# Patient Record
Sex: Male | Born: 1960 | Race: White | Hispanic: No | Marital: Married | Smoking: Current every day smoker
Health system: Southern US, Community
[De-identification: ages and names within clinical notes are randomized; demographics above are authoritative.]

## PROBLEM LIST (undated history)

## (undated) DIAGNOSIS — R51 Headache: Secondary | ICD-10-CM

## (undated) DIAGNOSIS — F419 Anxiety disorder, unspecified: Secondary | ICD-10-CM

## (undated) DIAGNOSIS — I639 Cerebral infarction, unspecified: Secondary | ICD-10-CM

## (undated) DIAGNOSIS — R0600 Dyspnea, unspecified: Secondary | ICD-10-CM

## (undated) DIAGNOSIS — L0390 Cellulitis, unspecified: Secondary | ICD-10-CM

## (undated) DIAGNOSIS — R42 Dizziness and giddiness: Secondary | ICD-10-CM

## (undated) DIAGNOSIS — I10 Essential (primary) hypertension: Secondary | ICD-10-CM

## (undated) DIAGNOSIS — F32A Depression, unspecified: Secondary | ICD-10-CM

## (undated) DIAGNOSIS — R519 Headache, unspecified: Secondary | ICD-10-CM

## (undated) DIAGNOSIS — G8929 Other chronic pain: Secondary | ICD-10-CM

## (undated) DIAGNOSIS — J449 Chronic obstructive pulmonary disease, unspecified: Secondary | ICD-10-CM

## (undated) HISTORY — PX: TONSILLECTOMY: SUR1361

---

## 1998-12-22 DIAGNOSIS — I639 Cerebral infarction, unspecified: Secondary | ICD-10-CM

## 1998-12-22 HISTORY — DX: Cerebral infarction, unspecified: I63.9

## 2001-04-27 ENCOUNTER — Emergency Department (HOSPITAL_COMMUNITY): Admission: EM | Admit: 2001-04-27 | Discharge: 2001-04-27 | Payer: Self-pay | Admitting: *Deleted

## 2001-06-03 ENCOUNTER — Emergency Department (HOSPITAL_COMMUNITY): Admission: EM | Admit: 2001-06-03 | Discharge: 2001-06-03 | Payer: Self-pay | Admitting: Emergency Medicine

## 2001-08-13 ENCOUNTER — Emergency Department (HOSPITAL_COMMUNITY): Admission: EM | Admit: 2001-08-13 | Discharge: 2001-08-13 | Payer: Self-pay | Admitting: Emergency Medicine

## 2001-09-08 ENCOUNTER — Encounter: Payer: Self-pay | Admitting: *Deleted

## 2001-09-08 ENCOUNTER — Emergency Department (HOSPITAL_COMMUNITY): Admission: EM | Admit: 2001-09-08 | Discharge: 2001-09-08 | Payer: Self-pay | Admitting: *Deleted

## 2001-09-16 ENCOUNTER — Emergency Department (HOSPITAL_COMMUNITY): Admission: EM | Admit: 2001-09-16 | Discharge: 2001-09-16 | Payer: Self-pay | Admitting: *Deleted

## 2001-10-09 ENCOUNTER — Emergency Department (HOSPITAL_COMMUNITY): Admission: EM | Admit: 2001-10-09 | Discharge: 2001-10-09 | Payer: Self-pay | Admitting: Emergency Medicine

## 2001-10-24 ENCOUNTER — Encounter: Payer: Self-pay | Admitting: *Deleted

## 2001-10-24 ENCOUNTER — Emergency Department (HOSPITAL_COMMUNITY): Admission: EM | Admit: 2001-10-24 | Discharge: 2001-10-24 | Payer: Self-pay | Admitting: *Deleted

## 2002-02-19 ENCOUNTER — Emergency Department (HOSPITAL_COMMUNITY): Admission: EM | Admit: 2002-02-19 | Discharge: 2002-02-19 | Payer: Self-pay | Admitting: Internal Medicine

## 2002-02-19 ENCOUNTER — Emergency Department (HOSPITAL_COMMUNITY): Admission: EM | Admit: 2002-02-19 | Discharge: 2002-02-19 | Payer: Self-pay | Admitting: *Deleted

## 2002-06-24 ENCOUNTER — Emergency Department (HOSPITAL_COMMUNITY): Admission: EM | Admit: 2002-06-24 | Discharge: 2002-06-24 | Payer: Self-pay | Admitting: Internal Medicine

## 2003-06-02 ENCOUNTER — Emergency Department (HOSPITAL_COMMUNITY): Admission: EM | Admit: 2003-06-02 | Discharge: 2003-06-03 | Payer: Self-pay | Admitting: *Deleted

## 2003-06-04 ENCOUNTER — Encounter: Payer: Self-pay | Admitting: Internal Medicine

## 2003-06-04 ENCOUNTER — Emergency Department (HOSPITAL_COMMUNITY): Admission: EM | Admit: 2003-06-04 | Discharge: 2003-06-04 | Payer: Self-pay | Admitting: Internal Medicine

## 2003-07-18 ENCOUNTER — Emergency Department (HOSPITAL_COMMUNITY): Admission: EM | Admit: 2003-07-18 | Discharge: 2003-07-18 | Payer: Self-pay | Admitting: *Deleted

## 2003-07-20 ENCOUNTER — Encounter: Payer: Self-pay | Admitting: Family Medicine

## 2003-07-20 ENCOUNTER — Inpatient Hospital Stay (HOSPITAL_COMMUNITY): Admission: EM | Admit: 2003-07-20 | Discharge: 2003-07-24 | Payer: Self-pay | Admitting: Emergency Medicine

## 2003-07-26 ENCOUNTER — Encounter: Payer: Self-pay | Admitting: General Surgery

## 2003-07-26 ENCOUNTER — Ambulatory Visit (HOSPITAL_COMMUNITY): Admission: RE | Admit: 2003-07-26 | Discharge: 2003-07-26 | Payer: Self-pay | Admitting: General Surgery

## 2003-09-27 ENCOUNTER — Emergency Department (HOSPITAL_COMMUNITY): Admission: EM | Admit: 2003-09-27 | Discharge: 2003-09-27 | Payer: Self-pay | Admitting: Emergency Medicine

## 2003-12-15 ENCOUNTER — Emergency Department (HOSPITAL_COMMUNITY): Admission: EM | Admit: 2003-12-15 | Discharge: 2003-12-15 | Payer: Self-pay | Admitting: Emergency Medicine

## 2004-01-10 ENCOUNTER — Emergency Department (HOSPITAL_COMMUNITY): Admission: EM | Admit: 2004-01-10 | Discharge: 2004-01-10 | Payer: Self-pay | Admitting: Emergency Medicine

## 2004-05-01 ENCOUNTER — Emergency Department (HOSPITAL_COMMUNITY): Admission: EM | Admit: 2004-05-01 | Discharge: 2004-05-01 | Payer: Self-pay | Admitting: *Deleted

## 2005-02-07 ENCOUNTER — Emergency Department (HOSPITAL_COMMUNITY): Admission: EM | Admit: 2005-02-07 | Discharge: 2005-02-07 | Payer: Self-pay | Admitting: *Deleted

## 2005-02-24 ENCOUNTER — Emergency Department (HOSPITAL_COMMUNITY): Admission: EM | Admit: 2005-02-24 | Discharge: 2005-02-24 | Payer: Self-pay | Admitting: *Deleted

## 2005-03-30 ENCOUNTER — Observation Stay (HOSPITAL_COMMUNITY): Admission: EM | Admit: 2005-03-30 | Discharge: 2005-03-31 | Payer: Self-pay | Admitting: *Deleted

## 2005-06-07 ENCOUNTER — Emergency Department (HOSPITAL_COMMUNITY): Admission: EM | Admit: 2005-06-07 | Discharge: 2005-06-07 | Payer: Self-pay | Admitting: Emergency Medicine

## 2005-06-14 ENCOUNTER — Emergency Department (HOSPITAL_COMMUNITY): Admission: EM | Admit: 2005-06-14 | Discharge: 2005-06-14 | Payer: Self-pay | Admitting: Emergency Medicine

## 2005-09-09 ENCOUNTER — Emergency Department (HOSPITAL_COMMUNITY): Admission: EM | Admit: 2005-09-09 | Discharge: 2005-09-09 | Payer: Self-pay | Admitting: Emergency Medicine

## 2005-09-20 ENCOUNTER — Emergency Department (HOSPITAL_COMMUNITY): Admission: EM | Admit: 2005-09-20 | Discharge: 2005-09-20 | Payer: Self-pay | Admitting: Emergency Medicine

## 2006-04-29 ENCOUNTER — Emergency Department (HOSPITAL_COMMUNITY): Admission: EM | Admit: 2006-04-29 | Discharge: 2006-04-29 | Payer: Self-pay | Admitting: Emergency Medicine

## 2006-04-30 ENCOUNTER — Emergency Department (HOSPITAL_COMMUNITY): Admission: EM | Admit: 2006-04-30 | Discharge: 2006-04-30 | Payer: Self-pay | Admitting: Emergency Medicine

## 2006-06-26 ENCOUNTER — Emergency Department (HOSPITAL_COMMUNITY): Admission: EM | Admit: 2006-06-26 | Discharge: 2006-06-26 | Payer: Self-pay | Admitting: Emergency Medicine

## 2006-07-13 ENCOUNTER — Emergency Department (HOSPITAL_COMMUNITY): Admission: EM | Admit: 2006-07-13 | Discharge: 2006-07-13 | Payer: Self-pay | Admitting: Emergency Medicine

## 2006-10-26 ENCOUNTER — Emergency Department (HOSPITAL_COMMUNITY): Admission: EM | Admit: 2006-10-26 | Discharge: 2006-10-26 | Payer: Self-pay | Admitting: Emergency Medicine

## 2006-12-14 ENCOUNTER — Emergency Department (HOSPITAL_COMMUNITY): Admission: EM | Admit: 2006-12-14 | Discharge: 2006-12-14 | Payer: Self-pay | Admitting: Emergency Medicine

## 2007-03-15 ENCOUNTER — Emergency Department (HOSPITAL_COMMUNITY): Admission: EM | Admit: 2007-03-15 | Discharge: 2007-03-16 | Payer: Self-pay | Admitting: Emergency Medicine

## 2007-07-06 ENCOUNTER — Emergency Department (HOSPITAL_COMMUNITY): Admission: EM | Admit: 2007-07-06 | Discharge: 2007-07-06 | Payer: Self-pay | Admitting: Emergency Medicine

## 2007-09-16 ENCOUNTER — Ambulatory Visit (HOSPITAL_COMMUNITY): Admission: RE | Admit: 2007-09-16 | Discharge: 2007-09-16 | Payer: Self-pay | Admitting: Family Medicine

## 2007-12-20 ENCOUNTER — Emergency Department (HOSPITAL_COMMUNITY): Admission: EM | Admit: 2007-12-20 | Discharge: 2007-12-20 | Payer: Self-pay | Admitting: Emergency Medicine

## 2008-04-01 ENCOUNTER — Emergency Department (HOSPITAL_COMMUNITY): Admission: EM | Admit: 2008-04-01 | Discharge: 2008-04-01 | Payer: Self-pay | Admitting: Emergency Medicine

## 2008-04-16 ENCOUNTER — Emergency Department (HOSPITAL_COMMUNITY): Admission: EM | Admit: 2008-04-16 | Discharge: 2008-04-16 | Payer: Self-pay | Admitting: Emergency Medicine

## 2008-04-19 ENCOUNTER — Emergency Department (HOSPITAL_COMMUNITY): Admission: EM | Admit: 2008-04-19 | Discharge: 2008-04-19 | Payer: Self-pay | Admitting: Emergency Medicine

## 2008-05-06 ENCOUNTER — Emergency Department (HOSPITAL_COMMUNITY): Admission: EM | Admit: 2008-05-06 | Discharge: 2008-05-06 | Payer: Self-pay | Admitting: Emergency Medicine

## 2008-10-18 ENCOUNTER — Emergency Department (HOSPITAL_COMMUNITY): Admission: EM | Admit: 2008-10-18 | Discharge: 2008-10-18 | Payer: Self-pay | Admitting: Emergency Medicine

## 2009-06-02 ENCOUNTER — Emergency Department (HOSPITAL_COMMUNITY): Admission: EM | Admit: 2009-06-02 | Discharge: 2009-06-02 | Payer: Self-pay | Admitting: Emergency Medicine

## 2009-06-14 ENCOUNTER — Emergency Department (HOSPITAL_COMMUNITY): Admission: EM | Admit: 2009-06-14 | Discharge: 2009-06-14 | Payer: Self-pay | Admitting: Emergency Medicine

## 2009-06-16 ENCOUNTER — Emergency Department (HOSPITAL_COMMUNITY): Admission: EM | Admit: 2009-06-16 | Discharge: 2009-06-16 | Payer: Self-pay | Admitting: Emergency Medicine

## 2009-07-01 ENCOUNTER — Emergency Department (HOSPITAL_COMMUNITY): Admission: EM | Admit: 2009-07-01 | Discharge: 2009-07-01 | Payer: Self-pay | Admitting: Emergency Medicine

## 2009-07-04 ENCOUNTER — Emergency Department (HOSPITAL_COMMUNITY): Admission: EM | Admit: 2009-07-04 | Discharge: 2009-07-04 | Payer: Self-pay | Admitting: Emergency Medicine

## 2009-07-13 ENCOUNTER — Emergency Department (HOSPITAL_COMMUNITY): Admission: EM | Admit: 2009-07-13 | Discharge: 2009-07-13 | Payer: Self-pay | Admitting: Emergency Medicine

## 2009-12-01 ENCOUNTER — Emergency Department (HOSPITAL_COMMUNITY): Admission: EM | Admit: 2009-12-01 | Discharge: 2009-12-01 | Payer: Self-pay | Admitting: Emergency Medicine

## 2010-07-02 ENCOUNTER — Emergency Department (HOSPITAL_COMMUNITY): Admission: EM | Admit: 2010-07-02 | Discharge: 2010-07-02 | Payer: Self-pay | Admitting: Emergency Medicine

## 2010-07-16 ENCOUNTER — Emergency Department (HOSPITAL_COMMUNITY): Admission: EM | Admit: 2010-07-16 | Discharge: 2010-07-16 | Payer: Self-pay | Admitting: Emergency Medicine

## 2010-08-15 ENCOUNTER — Emergency Department (HOSPITAL_COMMUNITY): Admission: EM | Admit: 2010-08-15 | Discharge: 2010-08-15 | Payer: Self-pay | Admitting: Emergency Medicine

## 2010-12-07 ENCOUNTER — Emergency Department (HOSPITAL_COMMUNITY)
Admission: EM | Admit: 2010-12-07 | Discharge: 2010-12-07 | Payer: Self-pay | Source: Home / Self Care | Admitting: Emergency Medicine

## 2010-12-21 ENCOUNTER — Emergency Department (HOSPITAL_COMMUNITY)
Admission: EM | Admit: 2010-12-21 | Discharge: 2010-12-21 | Payer: Self-pay | Source: Home / Self Care | Admitting: Emergency Medicine

## 2010-12-31 ENCOUNTER — Emergency Department (HOSPITAL_COMMUNITY)
Admission: EM | Admit: 2010-12-31 | Discharge: 2010-12-31 | Payer: Self-pay | Source: Home / Self Care | Admitting: Emergency Medicine

## 2011-01-12 ENCOUNTER — Encounter: Payer: Self-pay | Admitting: Occupational Therapy

## 2011-03-03 ENCOUNTER — Emergency Department (HOSPITAL_COMMUNITY)
Admission: EM | Admit: 2011-03-03 | Discharge: 2011-03-03 | Disposition: A | Discharge status: Home / Self Care | Payer: Self-pay | Attending: Emergency Medicine | Admitting: Emergency Medicine

## 2011-03-09 LAB — DIFFERENTIAL
Basophils Relative: 1 % (ref 0–1)
Eosinophils Absolute: 0.4 10*3/uL (ref 0.0–0.7)
Eosinophils Relative: 5 % (ref 0–5)
Lymphocytes Relative: 29 % (ref 12–46)
Lymphs Abs: 2.8 10*3/uL (ref 0.7–4.0)
Monocytes Absolute: 0.7 10*3/uL (ref 0.1–1.0)
Monocytes Relative: 7 % (ref 3–12)
Neutro Abs: 5.8 10*3/uL (ref 1.7–7.7)

## 2011-03-09 LAB — CBC
RBC: 5.8 MIL/uL (ref 4.22–5.81)
WBC: 9.7 10*3/uL (ref 4.0–10.5)

## 2011-03-09 LAB — BASIC METABOLIC PANEL
CO2: 27 mEq/L (ref 19–32)
Calcium: 9.2 mg/dL (ref 8.4–10.5)
Creatinine, Ser: 0.91 mg/dL (ref 0.4–1.5)
GFR calc Af Amer: >60 mL/min (ref >60)
GFR calc non Af Amer: >60 mL/min (ref >60)

## 2011-03-31 LAB — URINALYSIS, ROUTINE W REFLEX MICROSCOPIC
Nitrite: NEGATIVE
Specific Gravity, Urine: >1.03 — ABNORMAL HIGH (ref 1.005–1.030)
Urobilinogen, UA: 0.2 mg/dL (ref 0.0–1.0)

## 2011-04-26 ENCOUNTER — Emergency Department (HOSPITAL_COMMUNITY)
Admission: EM | Admit: 2011-04-26 | Discharge: 2011-04-26 | Disposition: A | Discharge status: Home / Self Care | Payer: Medicaid Other | Attending: Emergency Medicine | Admitting: Emergency Medicine

## 2011-04-26 DIAGNOSIS — Z79899 Other long term (current) drug therapy: Secondary | ICD-10-CM | POA: Insufficient documentation

## 2011-04-26 DIAGNOSIS — I10 Essential (primary) hypertension: Secondary | ICD-10-CM | POA: Insufficient documentation

## 2011-04-26 DIAGNOSIS — K089 Disorder of teeth and supporting structures, unspecified: Secondary | ICD-10-CM | POA: Insufficient documentation

## 2011-04-26 DIAGNOSIS — F411 Generalized anxiety disorder: Secondary | ICD-10-CM | POA: Insufficient documentation

## 2011-04-26 DIAGNOSIS — E7800 Pure hypercholesterolemia, unspecified: Secondary | ICD-10-CM | POA: Insufficient documentation

## 2011-05-09 NOTE — H&P (Signed)
NAME:  AMOS, MICHEALS                        ACCOUNT NO.:  [id number]   MEDICAL RECORD NO.:  [id number]                   PATIENT TYPE:  INP   LOCATION:  A328                                 FACILITY:  [facility]   PHYSICIAN:  Annia Friendly. Loleta Chance, M.D.                DATE OF BIRTH:  09-04-1961   DATE OF ADMISSION:  07/19/2003  DATE OF DISCHARGE:                                HISTORY & PHYSICAL   HISTORY:  The patient is a 50 year old married, disabled white male from  [place], [place].  Chief complaint is painful red area on leg  lateral posterior thigh x3 days.  The patient was seen in the emergency room  at [facility] for swelling and redness of right posterior thigh on  March 18, 2003.  He was treated with analgesia.  History is positive for  fever (101 degrees Fahrenheit at home on the night of admission), general  malaise, and chills.  History is negative for known drainage from affected  area.  The patient indicated the area has gotten worse secondary to frequent  scratching after removing four ticks approximately four months ago by  spouse.  He denies headache, diplopia, joint pain, and rashes.  Medical  history is positive for stroke in 2001 with mild right hemiparesis (treated  at [facility] by Dr. Esmeralda Arthur), hypertension, and chronic anxiety.  Medical history is negative for diabetes, cancer, cystic fibrosis, asthma,  seizure disorder.   PRESCRIBED MEDICATIONS:  1. Norvasc 5 mg p.o. daily.  2. Xanax 1 mg p.o. three times daily.  3. Darvocet-N 100 1 tablet p.o. q.6h. p.r.n. for pain.  4. Plavix 75 mg p.o. daily.  5. The patient also is taking an antibiotic as prescribed by the emergency     room (name unknown).   ALLERGIES:  The patient is allergic to PENICILLIN (rash) and allergic to  CODEINE (increased heart rate).   HABITS:  Positive for cigarette smoking (one pack per day since age 27).  Habits are positive for former use of ethanol x 16 years  and former use of  street drugs.   PAST MEDICAL HISTORY:  1. Positive for hospitalization also for tonsillectomy at age 33 at [facility] in [place], [place].  2. Pneumonia and left rib fracture after experiencing being hit by a fallen     tree at [facility] [place].   FAMILY HISTORY:  Mother living, age 22, health unknown.  Father deceased in  his 71s secondary to heart attack.  One sister living, age 9, good health.  One brother living, age 14, with a history of heart disease.  Three sons  living, ages 88, 49, and 108, good health.   SEXUALLY-TRANSMITTED DISEASE:  Negative for gonorrhea, syphilis, herpes, and  HIV infection.   REVIEW OF SYSTEMS:  Positive for chronic right eye pain, being managed by  ophthalmologist in [place].  Episodic moods of melancholy, crying spells.  Episodic numbness and weakness of right hand.  Review of systems is negative  for epistaxis, dysphagia, hemoptysis, chest pain, hematemesis, dysuria,  gross hematuria, melena, constipation, diarrhea, edema of legs, weight loss,  night sweats, etc.   PHYSICAL EXAMINATION:  VITAL SIGNS:  Temperature 97, pulse 64, respirations  18, blood pressure 102/60.  GENERAL:  Middle-aged, medium-framed, medium-height white male in no  apparent respiratory distress.  HEENT:  Head:  Normocephalic.  Ears:  Normal auricles.  External canals  patent.  Tympanic membranes pearly gray.  Eyes:  Lids negative for ptosis.  Sclerae white.  Pupils round and equal and reactive to light.  Extraocular  movements intact.  Nose:  Negative for discharge.  Mouth:  Positive for  missing teeth.  Remainder of dentition poor with many cavities.  No bleeding  gums.  No oral lesions.  Posterior pharynx benign.  NECK:  Negative for lymphadenopathy or thyromegaly.  LUNGS:  Clear.  HEART:  Audible S1, S2.  Without murmur, rub, or gallop.  Regular rate and  rhythm.  ABDOMEN:  No distention.   Hyperactive bowel sounds.  Soft and nontender in  all four quadrants.  No palpable mass.  No organomegaly.  GENITOURINARY:  Normal male, uncircumcised.  No penile lesion or discharge.  Scrotum:  Palpable testicles without nodules or tenderness.  RECTAL:  No external lesions.  Digital exam:  Prostate mildly enlarged and  smooth.  No rectal vault masses.  Stool guaiac-negative.  EXTREMITIES:  Right lateral posterior thigh positive for tender, red area of  induration about the diameter of 3 cm x 3 cm, surrounded by confluent  redness.  No active drainage.  A few pustular lesions at the center.  Tender  on palpation.  Joints:  No redness, no swelling, no hotness.  NEUROLOGIC:  Alert and oriented to person, place, and time.  Cranial nerves  II-XII appeared intact.   LABORATORIES:  Urinalysis:  Specific gravity 1.010, pH 5, no glucose, no  bilirubin, no ketones, no blood.  Negative for protein, nitrate negative,  leukocyte esterase negative.  White count 14.6, hemoglobin 11.6, hematocrit  35.9, platelets 248,000.  Sodium 134, potassium 3.9, chloride 103, CO2 31,  glucose 103, BUN 10, creatinine 0.9, calcium 8.9.  [facility] spotted  fever:  IgM 0.5, reference range indicates negative, less than 0.9.   IMPRESSION:  Primary right lateral posterior thigh abscess with surrounding  cellulitis.   SECONDARY DIAGNOSES:  1. Hypertension.  2. Chronic anxiety.   PLAN:  X-ray of the right thigh.  Blood cultures.  IV.  Antibiotic using  doxycycline.  IV fluids.  Tylenol for temperature.  Darvocet-N 100 for pain.  Plavix 75 mg p.o. daily.  Norvasc 5 mg p.o. daily.  One aspirin 81 mg p.o.  daily.  Protonix 40 mg p.o. daily.  Xanax 1 mg p.o. three times daily.  Diet:  Four-gram sodium.  Surgical consult.                                               Annia Friendly. Loleta Chance, M.D.    Levonne Hubert  D:  07/20/2003  T:  07/20/2003  Job:  [id number]

## 2011-05-09 NOTE — Consult Note (Signed)
NAME:  Gary Harrington, Gary Harrington NO.:  [id number]   MEDICAL RECORD NO.:  [id number]          PATIENT TYPE:  EMS   LOCATION:  ED                            FACILITY:  [facility]   PHYSICIAN:  J. Darreld Mclean, M.D. DATE OF BIRTH:  07-19-1961   DATE OF CONSULTATION:  09/09/2005  DATE OF DISCHARGE:                                   CONSULTATION   REFERRING PHYSICIAN:  Rhae Lerner. Margretta Ditty, M.D.   CHIEF COMPLAINT:  I got cut with a piece of glass.   REASON FOR CONSULTATION:  Dr. Margretta Ditty asked that I see him.  The patient is  a 50 year old male who was working on a house.  He had a window that broke  and a piece of glass slipped and fell and cut him in his mid left forearm.  He lost sensation of the tips of the fingers in the median distribution and  he does not appear to have palmaris longus intact.  The flexor tendons to  each of the fingers work and to the thumb is working.  He is on Plavix and  he bled rather freely.  He is on Plavix because he had a stroke.  He is very  anxious.  No other apparent injuries.   It appears that he has cut the palmaris longus and probably the median  nerve, as best I can tell as he bleeding rather freely.  Hemostasis was  obtained and then the wound was sutured with staples.  Sterile dressing was  applied and bulky dressing applied.   IMPRESSION:  Laceration of palmaris longus tendon with probable laceration  of the median nerve.   RECOMMENDATIONS:  He needs to come off the Plavix.  I told him to stop that.  He will need surgical repair of these probably next week.  I will see him in  the office tomorrow.  A prescription for Vicodin ES given and Levaquin 500.  If any difficulty, return back to the ER.           ______________________________  J. Darreld Mclean, M.D.     JWK/MEDQ  D:  09/09/2005  T:  09/09/2005  Job:  [id number]

## 2011-05-09 NOTE — Op Note (Signed)
   NAME:  Gary Harrington, Gary Harrington                        ACCOUNT NO.:  [id number]   MEDICAL RECORD NO.:  [id number]                   PATIENT TYPE:  INP   LOCATION:  A328                                 FACILITY:  [facility]   PHYSICIAN:  Dirk Dress. Katrinka Blazing, M.D.                DATE OF BIRTH:  January 22, 1961   DATE OF PROCEDURE:  07/21/2003  DATE OF DISCHARGE:                                  PROCEDURE NOTE   PREOPERATIVE DIAGNOSIS:  Major abscess, right thigh.   POSTOPERATIVE DIAGNOSIS:  Major abscess, right thigh.   PROCEDURE:  Wide excision of abscess, right thigh.   SURGEON:  Dirk Dress. Katrinka Blazing, M.D.   DESCRIPTION OF PROCEDURE:  Under spinal anesthesia, the right thigh and leg  were prepped and draped in a sterile field.  Wide excision of the large dome  of the abscess was carried out.  Cultures of the necrotic drainage were  taken for aerobic and anaerobic cultures.  Excision of the deep subcutaneous  necrotic tissue was carried out.  Hemostasis was achieved.  The area was  flushed with saline and Betadine.  The wound was packed with Betadine-soaked  4 x 4's.  A sterile dressing was placed.  The patient was transferred to a  bed and taken to the postanesthesia care unit for further monitoring.                                               Dirk Dress. Katrinka Blazing, M.D.    LCS/MEDQ  D:  07/21/2003  T:  07/21/2003  Job:  [id number]

## 2011-05-09 NOTE — Discharge Summary (Signed)
Gary Harrington, Gary Harrington NO.:  [id number]   MEDICAL RECORD NO.:  [id number]          PATIENT TYPE:  INP   LOCATION:  A216                          FACILITY:  [facility]   PHYSICIAN:  Vania Rea, M.D. DATE OF BIRTH:  [birth year]   DATE OF ADMISSION:  03/30/2005  DATE OF DISCHARGE:  04/10/2006LH                                 DISCHARGE SUMMARY   DISCHARGE DIAGNOSES:  1.  Chronic right hemiplegia of unclear etiology.  2.  Radicular neuropathy.  3.  Hypertension, controlled.  4.  Chronic anxiety with benzodiazepine dependence.  5.  Microcytosis with h/o hereditary anemia.   DISPOSITION:  Discharge to home.   DISCHARGE CONDITION:  Stable.   DISCHARGE MEDICATIONS:  1.  Neurontin 300 mg twice daily.  2.  Xanax 1 mg three times daily.  3.  Plavix 75 mg daily.  4.  Norvasc 10 mg a day.  5.  ECASA 325 mg daily.  6.  Microcytosis with h/o hereditary anemia.   HOSPITAL COURSE:  Please refer to admission history and physical.  This is a  50 year old white man who gives a history of being diagnosed with a stroke  in 2001 which left him with residual right hemiparesis, and while washing  his car on the day prior to admission, developed a sudden spasm in the right  arm and then weakness in the right leg which made it difficult for him to  walk.  The patient came to the emergency room where the admitting physician  also noted some slurred speech, weakness of the right arm and right leg and  the patient was admitted to rule out CVA.  The patient had a CT scan of the  brain which showed no acute abnormality.  By the time the patient was seen  on the ward this morning, his neurologic function had improved to baseline  with grade 3 to 4 power in the right upper extremity and grade 4 power in  the right lower extremity but he continued to have lightning like pains down  his right upper extremity.  The patient had an initial exam with an MRI but  could not tolerate it because of  claustrophobia.  He received 4 mg of Ativan  IV and then was able to have an MRI of the brain and also MRI of the C-  spine.  MRI of the brain showed no evidence of acute or chronic infarct and  MRI of the cervical spine, although influenced by motion artifact, showed  degenerative joint disease but no evidence of spinal stenosis or disk  narrowing.   The patient is considered fit for discharge but has been recommended for  follow-up with a neurologist, Dr. Gerilyn Pilgrim, as soon as possible for  evaluation of his neurological disorder.  At the time of discharge, the  patient is alert and oriented, sitting on the edge of the bed.  He is able  to walk 300 feet without assistance and without loosing his balance.  He is  weak on the left.   His vitals:  Temperature is 96.8, pulse 60, respirations 22, blood pressure  116/78.  His saturation is adequate.  His chest is clear to auscultation  bilaterally.  Cardiovascular system:  He has a regular rhythm.  His abdomen  is mildly obese, soft and nontender.  His extremities are without edema.  Central nervous system as noted.   His labs are as noted on admission.  The white cell count was normal at  10.5.  His hemoglobin is normal at 13.3 but his red cell count surprisingly  was markedly elevated at 6.49, upper limits of normal being 5.81.  His MCV:  He had microcytosis with an MCV of 65.5.  These are suggestive of  thallasemia.  He had a normal differential on his white count.  His PT was  12.8, INR 1.0 and PTT 29.  His ESR was 1.  His sodium was 135, potassium  3.8, chloride 104, CO2 22, glucose 105, BUN 14, creatinine 1.0, calcium 9.4.   FOLLOW-UP:  The patient is to follow-up with Dr. Gerilyn Pilgrim in one to two  weeks and to follow-up with his physicians at [facility] as  soon as possible. 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      LC/MEDQ  D:  03/31/2005  T:  03/31/2005  Job:  [id number]   cc:   Darleen Crocker A. Gerilyn Pilgrim, M.D.   [address] [place]  [place]  [place] [address]  Fax: [phone]   [facility]

## 2011-05-09 NOTE — H&P (Signed)
Gary Harrington, Gary Harrington              ACCOUNT NO.:  [id number]   MEDICAL RECORD NO.:  [id number]          PATIENT TYPE:  INP   LOCATION:  A216                          FACILITY:  [facility]   PHYSICIAN:  Calvert Cantor, M.D.     DATE OF BIRTH:  05-13-1961   DATE OF ADMISSION:  03/30/2005  DATE OF DISCHARGE:  LH                                HISTORY & PHYSICAL   PRESENTING COMPLAINT:  Pain and weakness in right arm.   HISTORY OF PRESENT ILLNESS:  This is a 50 year old white male with past  medical history of a stroke in 2001 with residual mild right-sided  hemiparesis.  The patient said yesterday about 2 p.m. while he was washing  his car, he noticed pain in his right hand which felt like in his words it  had been run over by a truck.  Afterwards, he noticed more pain in his  right arm; his right arm began to draw up, and he had difficulty holding  items.  Since he had a history of a stroke, he thought he might have been  having a stroke again and therefore came to the ER.  In the ER, he was seen  by Dr. Mosetta Putt, who noticed that he had some slurred speech and right arm and  right leg weakness.  The right leg weakness was less than the right arm.   REVIEW OF SYSTEMS:  No diplopia, no headache, no chest pain, no shortness of  breath, no abdominal pain, no diarrhea, no dysuria.   PAST MEDICAL HISTORY:  1.  Stroke in 2001 with residual right-sided hemiparesis.  2.  Hypertension.  3.  Chronic anxiety and panics.  4.  History of abscesses on his right side, status post insect bite.   PAST SURGICAL HISTORY:  1.  Tonsillectomy.  2.  Drainage of an abscess on his right thigh.  3.  History of rib fracture in 1999.   MEDICATIONS:  1.  Plavix 75 mg daily.  2.  Norvasc 5 mg b.i.d.  3.  Xanax 1 mg t.i.d.   ALLERGIES:  The patient states he is allergic to PENICILLIN which causes a  rash and CODEINE which causes tachycardia.   SOCIAL HISTORY:  The patient is a smoker, smoking 1 pack-per-day  since  he  was 50 years old.  He had a history of alcohol abuse but states that he has  stopped drinking, and he had a history of marijuana use which he states that  he has also stopped.   FAMILY HISTORY:  His mother is alive at age 50.  She has breast cancer.  Father passed away in his 45s secondary to a heart attack.  He has a brother  and sister who are alive.  Their health is unknown to him.   PHYSICAL EXAMINATION:  VITAL SIGNS:  The patient had a temperature of 97.6,  blood pressure of 119/86.  Pulse was 81.  Respiratory rate was 22.  Pulse  oximetry was 98% on room air.  HEENT:  Atraumatic, normocephalic.  Pupils are equal, round, and reactive to  light and accommodation.  Extraocular muscles are intact.  Mucosa is moist.  Tongue is midline.  He is able to move his tongue to the left and the right.  NECK:  Supple.  HEART:  Regular rate and rhythm.  There are no murmurs.  LUNGS:  Clear bilaterally.  ABDOMEN:  Soft, nontender.  Bowel sounds are positive.  EXTREMITIES:  No cyanosis, clubbing, or edema.  NEUROLOGIC:  The patient is lying in bed with his right hand in a flexed  position.  Strength in the right hand is decreased to a 4/5.  Strength in  the right leg is also decreased to 4/5.  While in the ER, the ER doctor  states that he was unable to lift his right leg off the bed.  However, now  he is able to lift it up off the bed but has a difficult time elevating it  beyond 5 inches.  Sensation is decreased on the right side to pinprick,  especially in his right fingertips.  Sensation is normal in his face, both  sides.  Cranial nerves are intact.   BLOOD WORK:  White count is 10.5.  Hemoglobin is 13.7, hematocrit 42.5, MCV  65, platelets 275, sodium 135, potassium 3.8, chloride 104, bicarb 22,  glucose 105, BUN 14, creatinine 1.0, calcium 9.4.  PT is 12.8.  PTT is 29.  The patient had a CAT scan in the ER showing mild ethmoid sinusitis and no  other acute findings.    ASSESSMENT/PLAN:  This is a 50 year old white male with a history of  cerebrovascular accident causing resulting right-sided weakness and  numbness, who is being admitted for increasing right hand weakness,  numbness, and pain.  Initially, the patient had slurred speech in the  emergency room and weakness of his left lower extremity which seems to have  improved in the past couple of hours.   I am going to resume his medications.  The patient will have an MRI in the  morning.  He will be given Dilaudid for pain relief and consults for Dr.  Gerilyn Pilgrim will be placed.  The patient is already on Plavix which will be  continued.  I will order a physical therapy/occupational therapy evaluation  and a speech evaluation as well.  He will receive __________ and IV  Protonix.  He will be kept NPO until he has received the swallowing  evaluation.      SR/MEDQ  D:  03/31/2005  T:  03/31/2005  Job:  [id number]

## 2011-05-09 NOTE — Discharge Summary (Signed)
NAME:  Gary Harrington, Gary Harrington                        ACCOUNT NO.:  [id number]   MEDICAL RECORD NO.:  [id number]                   PATIENT TYPE:  INP   LOCATION:  A328                                 FACILITY:  [facility]   PHYSICIAN:  Annia Friendly. Loleta Chance, M.D.                DATE OF BIRTH:  04-13-1961   DATE OF ADMISSION:  07/19/2003  DATE OF DISCHARGE:  07/24/2003                                 DISCHARGE SUMMARY   IDENTIFICATION:  The patient was a 50 year old married, disabled white male  from [place].   CHIEF COMPLAINT:  Painful, red area on the lateral aspect of the posterior  thigh of the right leg x3 days.   HISTORY:  The patient was seen in the emergency room at [facility]  for swelling and redness of the right posterior thigh on July 18, 2003; he  was treated with an analgesic.  History was positive for fever (101 degrees  Fahrenheit at home on the night of admission), general malaise, and chills.  History was negative for known drainage from the affected area.  The patient  indicated that the area had gotten worse secondary to frequent scratching  after removing four ticks approximately four months ago by his spouse.  He  denied headache, diplopia, joint pain, or rash.   PAST MEDICAL HISTORY:  1. Stroke in 2001, with mild right hemiparesis (treated at [facility] by Dr. Esmeralda Arthur).  2. Hypertension.  3. Chronic anxiety.   MEDICATIONS:  The patient was taking an antibiotic prescribed by the  emergency room (name unknown) at the time of admission.   ALLERGIES:  The patient is allergic to PENICILLIN (rash) and is allergic to  CODEINE (increased heart rate).   HABITS:  Positive for cigarette smoking (one pack per day since age 3) and  positive former use of ethanol x16 years.  The patient's habits are also  positive for former use of street drugs.   HOSPITALIZATIONS:  1. Positive hospitalization for tonsillectomy at age 59 at [facility]  in [place].  2. Hospitalization for pneumonia and left rib fracture after experiencing     trauma secondary to a fallen tree, treated at [facility] in [place], in 1999.   FAMILY HISTORY:  Mother is living at age 4, health unknown.  Father is  deceased in his 5s secondary to a heart attack.  One sister is living at  age 24 in good health.  One brother is living at age 62 with a history of  heart disease.  Three sons are living at ages 31, 73, and 78, in good  health.   HOSPITAL COURSE:  Problem #1.  Abscess of the right thigh:  General  appearance revealed a middle-aged, medium height, medium framed white male  in no apparent respiratory distress.  Vital signs on admission:  Temperature  97, pulse 64, respirations 18, blood pressure 162/60.  Lungs were clear.  Heart revealed an audible S1 and S2 without murmur.  Rhythm was regular and  the rate within normal limits.  Right lower extremity, lateral posterior  thigh was positive for a tender, red area of induration about the diameter  of 3 x 3 cm surrounded by confluent redness.  Examination of this area at  the time of admission demonstrated no drainage.  A few pustular lesions were  at the center of the lesion.  The lesion was tender on palpation.  The  patient had no red streaks down the right lower extremity.  The patient was  neurologically intact.   Significant labs on admission were as follows:  White count 14.6, hemoglobin  11.6, hematocrit 35.9, platelets 248,000.  Sodium 134, potassium 3.9,  chloride 103, CO2 31, glucose 103, BUN 10, creatinine 0.9, calcium 8.9.  Urinalysis:  Specific gravity 1.010, pH 5, no glucose, no bilirubin, no  ketones, no blood, no protein, nitrite negative, leukocyte esterase  negative.  [place] Spotted Fever IgM was 0.5, reference rate  indicated negative less than 0.9.   The patient was admitted and started on IV fluids, ordered blood cultures  x2, doxycycline 100 mg IV q.12  h., a surgical consultation, analgesia for  pain.  X-ray of the right lower extremity demonstrated no evidence of bone  or joint abnormalities; no abnormal soft tissue gas was identified, as read  by Dr. Kerrin Mo.  Chest x-ray demonstrated no acute cardiac or pulmonary  process.  The patient was seen by Dr. Maggie Schwalbe, surgeon, on July 20, 2003,  and Dr. Katrinka Blazing counseled the patient on the procedure of an I&D.  The  procedure was performed on July 21, 2003, without complication.  Dr. Katrinka Blazing  performed a right Y excision of the abscess of the right thigh.  Necrotic  tissue was sent to pathology.  Repeat white count on July 23, 2003, 8.9,  hemoglobin 11.7, hematocrit 37.0, platelets 327,000.   The patient was afebrile on the day of discharge.  The wound looked good,  according to Dr. Michaelle Copas notes.  The patient was discharged home on July 24, 2003.  He was discharged home on antibiotics.  Home health consultation  was done for nursing care for wound.  The patient is to follow up in the  office in two weeks.   Problem #2.  Hypertension:  Blood pressure on admission was 162/60.  The  patient was treated with sodium restriction.  Also, he was continued on  antihypertensive medications.  Chest x-ray demonstrated cardiac silhouette  within normal limits.  Examination of extremities demonstrated no edema.  Blood pressure was controlled during this hospitalization without problem.  Blood pressure on the morning of discharge was 93/54; repeat blood pressure  on the afternoon of discharge was 114/75.  Pulse was 62.  The patient had no  complaint of chest pain, dizziness, or shortness of breath.   Problem #3.  Chronic anxiety:  The patient was continued on Xanax 1 mg p.o.  t.i.d.  The patient was observed often to be tearful.  He denied suicidal  ideation.  Appetite was good.  He remained alert and oriented to person, place, and time throughout his hospitalization.  He did not experience any   visual, tactile, or auditory hallucinations.  He will be encouraged to seek  counseling at mental health pertaining to his tearful moods.   Problem #4.  Post  CVA:  Examination demonstrated cranial nerves II-XII  appeared intact.  The patient demonstrated no significant weakness of the  right lower extremity.  He had no complaint of dysphasia or trouble with  memory.  Speech was clear and appropriate.  He was continued on Plavix 75 mg  p.o., as before admission.   DIET:  Low salt.   ACTIVITY:  As tolerated.   DISCHARGE MEDICATIONS:  1. Cleocin 300 mg p.o. q.i.d.  2. Levaquin 500 mg p.o. daily.  3. Demerol 50 mg p.o. q.6 h. p.r.n. for pain.  4. Norvasc 5 mg p.o. daily.  5. Xanax 1 mg p.o. t.i.d.  6. Plavix 75 mg p.o. daily.   FOLLOWUP:  Follow up at the office on Thursday, July 27, 2003.   FINAL DIAGNOSIS:  Acute abscess of the right lower extremity.   SECONDARY DIAGNOSES:  1. Hypertension.  2. Chronic anxiety.  3. Positive cerebrovascular accident without neurovascular sequelae.                                               Annia Friendly. Loleta Chance, M.D.    Levonne Hubert  D:  07/24/2003  T:  07/25/2003  Job:  [id number]

## 2011-08-12 ENCOUNTER — Encounter: Payer: Self-pay | Admitting: *Deleted

## 2011-08-12 ENCOUNTER — Emergency Department (HOSPITAL_COMMUNITY)
Admission: EM | Admit: 2011-08-12 | Discharge: 2011-08-12 | Disposition: A | Discharge status: Home / Self Care | Payer: Medicaid Other | Attending: Emergency Medicine | Admitting: Emergency Medicine

## 2011-08-12 DIAGNOSIS — S61219A Laceration without foreign body of unspecified finger without damage to nail, initial encounter: Secondary | ICD-10-CM

## 2011-08-12 DIAGNOSIS — W268XXA Contact with other sharp object(s), not elsewhere classified, initial encounter: Secondary | ICD-10-CM | POA: Insufficient documentation

## 2011-08-12 DIAGNOSIS — F17200 Nicotine dependence, unspecified, uncomplicated: Secondary | ICD-10-CM | POA: Insufficient documentation

## 2011-08-12 DIAGNOSIS — S61209A Unspecified open wound of unspecified finger without damage to nail, initial encounter: Secondary | ICD-10-CM | POA: Insufficient documentation

## 2011-08-12 HISTORY — DX: Essential (primary) hypertension: I10

## 2011-08-12 HISTORY — DX: Cerebral infarction, unspecified: I63.9

## 2011-08-12 MED ORDER — HYDROCODONE-ACETAMINOPHEN 5-325 MG PO TABS
2.0000 | ORAL_TABLET | Freq: Once | ORAL | Status: AC
  Administered 2011-08-12: 2 via ORAL
  Filled 2011-08-12: qty 2

## 2011-08-12 MED ORDER — ONDANSETRON HCL 4 MG PO TABS
4.0000 mg | ORAL_TABLET | Freq: Once | ORAL | Status: AC
  Administered 2011-08-12: 4 mg via ORAL
  Filled 2011-08-12: qty 1

## 2011-08-12 MED ORDER — HYDROCODONE-ACETAMINOPHEN 5-325 MG PO TABS: ORAL_TABLET | ORAL | Status: DC

## 2011-08-12 NOTE — ED Notes (Signed)
PA at bedside to assess pt.

## 2011-08-12 NOTE — ED Notes (Signed)
Pt has small incision on outside of right knuckle.  Bleeding controlled.  Gauze dressing applied.  Per pt, he cut it on a can of corn.  Reports last tetanus shot was 2-3 years ago.

## 2011-08-12 NOTE — ED Notes (Signed)
Patient with laceration to right index finger, cut self by accident on an open can lid

## 2011-08-12 NOTE — ED Notes (Signed)
Pt's wife keeps calling from home to check on why her husband has been waiting so long for stitches and a splint keeps saying that pt has a ride here that is just about to leave . Pt's wife informed that he would be splinted as soon as an order was placed in computer . When I  checked on the pt every   thing had already been done and pt had already been splinted and up for discharge pt was delayed because he was requesting additional pain meds and prescription R.N. Also aware. Earlie Arciga

## 2011-08-12 NOTE — ED Provider Notes (Signed)
History     CSN: [id number] Arrival date & time: 08/12/2011  7:14 PM  Chief Complaint  Patient presents with  . Laceration   HPI Comments: Pt cut finger tonight on a can lid. He controlled bleeding by applying pressure. He reports tetanus up to date.  Patient is a 50 y.o. male presenting with skin laceration. The history is provided by the patient.  Laceration  The incident occurred 1 to 2 hours ago. The laceration is located on the right hand. The laceration is 1 cm in size. The laceration mechanism was a a metal edge. The pain is at a severity of 8/10. The pain is moderate. The pain has been constant since onset. He reports no foreign bodies present. His tetanus status is UTD.    Past Medical History  Diagnosis Date  . Stroke   . Hypertension     History reviewed. No pertinent past surgical history.  History reviewed. No pertinent family history.  History  Substance Use Topics  . Smoking status: Current Everyday Smoker -- 1.0 packs/day    Types: Cigarettes  . Smokeless tobacco: Not on file  . Alcohol Use: No      Review of Systems  Constitutional: Negative for activity change.       All ROS Neg except as noted in HPI  HENT: Negative for nosebleeds and neck pain.   Eyes: Negative for photophobia and discharge.  Respiratory: Negative for cough, shortness of breath and wheezing.   Cardiovascular: Negative for chest pain and palpitations.  Gastrointestinal: Negative for abdominal pain and blood in stool.  Genitourinary: Negative for dysuria, frequency and hematuria.  Musculoskeletal: Negative for back pain and arthralgias.  Skin: Negative.   Neurological: Negative for dizziness, seizures and speech difficulty.  Psychiatric/Behavioral: Negative for hallucinations and confusion. The patient is nervous/anxious.     Physical Exam  BP 116/77  Pulse 72  Temp(Src) 97.8 F (36.6 C) (Oral)  Resp 18  Ht 5\' 9"  (1.753 m)  Wt 190 lb (86.183 kg)  BMI 28.06 kg/m2  SpO2  100%  Physical Exam  Nursing note and vitals reviewed. Constitutional: He is oriented to person, place, and time. He appears well-developed and well-nourished.  Non-toxic appearance.  HENT:  Head: Normocephalic.  Right Ear: Tympanic membrane and external ear normal.  Left Ear: Tympanic membrane and external ear normal.  Eyes: EOM and lids are normal. Pupils are equal, round, and reactive to light.  Neck: Normal range of motion. Neck supple. Carotid bruit is not present.  Cardiovascular: Normal rate, regular rhythm, normal heart sounds, intact distal pulses and normal pulses.   Pulmonary/Chest: Breath sounds normal. No respiratory distress.  Abdominal: Soft. Bowel sounds are normal. There is no tenderness. There is no guarding.  Musculoskeletal: Normal range of motion.       Laceration of the mid dorsal area of the right index finger. No capsule, bone, or tendon involvement. FROM noted.  Lymphadenopathy:       Head (right side): No submandibular adenopathy present.       Head (left side): No submandibular adenopathy present.    He has no cervical adenopathy.  Neurological: He is alert and oriented to person, place, and time. He has normal strength. No cranial nerve deficit or sensory deficit.  Skin: Skin is warm and dry.  Psychiatric: His speech is normal. His mood appears anxious.    ED Course  LACERATION REPAIR Date/Time: 08/12/2011 9:04 PM Performed by: Kathie Dike Authorized by: Billee Cashing Consent: Verbal  consent obtained. Risks and benefits: risks, benefits and alternatives were discussed Consent given by: patient Patient understanding: patient states understanding of the procedure being performed Patient identity confirmed: verbally with patient Time out: Immediately prior to procedure a "time out" was called to verify the correct patient, procedure, equipment, support staff and site/side marked as required. Laceration length: 1 cm Tendon involvement:  none Nerve involvement: none Vascular damage: no Irrigation solution: tap water Amount of cleaning: extensive Debridement: none Degree of undermining: none Skin closure: glue Approximation: close Approximation difficulty: simple Patient tolerance: Patient tolerated the procedure well with no immediate complications.  SPLINT APPLICATION Date/Time: 08/12/2011 9:06 PM Performed by: Kathie Dike Authorized by: Billee Cashing Consent: Verbal consent obtained. Risks and benefits: risks, benefits and alternatives were discussed Consent given by: patient Patient understanding: patient states understanding of the procedure being performed Patient identity confirmed: verbally with patient Time out: Immediately prior to procedure a "time out" was called to verify the correct patient, procedure, equipment, support staff and site/side marked as required. Location details: right index finger Splint type: static finger Supplies used: aluminum splint Post-procedure: The splinted body part was neurovascularly unchanged following the procedure. Patient tolerance: Patient tolerated the procedure well with no immediate complications.    MDM I have reviewed nursing notes, vital signs, and all appropriate lab and imaging results for this patient.      Kathie Dike, [place] 08/13/11 [phone]

## 2011-08-13 NOTE — ED Provider Notes (Signed)
Medical screening examination/treatment/procedure(s) were performed by non-physician practitioner and as supervising physician I was immediately available for consultation/collaboration.  Juliet Rude. Rubin Payor, MD 08/13/11 1454

## 2011-11-12 ENCOUNTER — Emergency Department (HOSPITAL_COMMUNITY): Payer: Self-pay

## 2011-11-12 ENCOUNTER — Emergency Department (HOSPITAL_COMMUNITY)
Admission: EM | Admit: 2011-11-12 | Discharge: 2011-11-12 | Disposition: A | Discharge status: Home / Self Care | Payer: Self-pay | Attending: Emergency Medicine | Admitting: Emergency Medicine

## 2011-11-12 ENCOUNTER — Encounter (HOSPITAL_COMMUNITY): Payer: Self-pay | Admitting: *Deleted

## 2011-11-12 DIAGNOSIS — IMO0002 Reserved for concepts with insufficient information to code with codable children: Secondary | ICD-10-CM | POA: Insufficient documentation

## 2011-11-12 DIAGNOSIS — Z8673 Personal history of transient ischemic attack (TIA), and cerebral infarction without residual deficits: Secondary | ICD-10-CM | POA: Insufficient documentation

## 2011-11-12 DIAGNOSIS — F17200 Nicotine dependence, unspecified, uncomplicated: Secondary | ICD-10-CM | POA: Insufficient documentation

## 2011-11-12 DIAGNOSIS — I10 Essential (primary) hypertension: Secondary | ICD-10-CM | POA: Insufficient documentation

## 2011-11-12 DIAGNOSIS — S9030XA Contusion of unspecified foot, initial encounter: Secondary | ICD-10-CM | POA: Insufficient documentation

## 2011-11-12 MED ORDER — HYDROCODONE-ACETAMINOPHEN 5-325 MG PO TABS
2.0000 | ORAL_TABLET | Freq: Once | ORAL | Status: AC
  Administered 2011-11-12: 2 via ORAL
  Filled 2011-11-12: qty 2

## 2011-11-12 MED ORDER — HYDROCODONE-ACETAMINOPHEN 5-325 MG PO TABS: ORAL_TABLET | ORAL | Status: AC

## 2011-11-12 NOTE — ED Notes (Signed)
Pt a/ox4. Resp even and unlabored. NAD at this time. D/C instructions and Rx given. Pt verbalized understanding. Pt ambulated to POV with steady gate.

## 2011-11-12 NOTE — ED Provider Notes (Signed)
History     CSN: [id number] Arrival date & time: 11/12/2011  8:21 PM   First MD Initiated Contact with Patient 11/12/11 2027      Chief Complaint  Patient presents with  . Foot Injury    (Consider location/radiation/quality/duration/timing/severity/associated sxs/prior treatment) HPI Comments: Patient c/o pain and swelling to the dorsal left foot after a large wooden log he was cutting fell on his left foot.  C/o pain with palpation and weight bearing.  He denies falling or other injuries.    Patient is a 50 y.o. male presenting with foot injury. The history is provided by the patient.  Foot Injury  The incident occurred yesterday. The incident occurred at home. The injury mechanism was a direct blow. The pain is present in the left foot. The quality of the pain is described as throbbing and aching. The pain is at a severity of 9/10. The pain is moderate. The pain has been constant since onset. Associated symptoms include inability to bear weight. Pertinent negatives include no numbness, no loss of motion, no muscle weakness, no loss of sensation and no tingling. He reports no foreign bodies present. The symptoms are aggravated by activity, bearing weight and palpation. He has tried nothing for the symptoms. The treatment provided no relief.    Past Medical History  Diagnosis Date  . Stroke   . Hypertension     History reviewed. No pertinent past surgical history.  History reviewed. No pertinent family history.  History  Substance Use Topics  . Smoking status: Current Everyday Smoker -- 1.0 packs/day    Types: Cigarettes  . Smokeless tobacco: Not on file  . Alcohol Use: No      Review of Systems  Constitutional: Negative for fever and fatigue.  HENT: Negative for sore throat, trouble swallowing, neck pain and neck stiffness.   Gastrointestinal: Negative for vomiting.  Genitourinary: Negative for dysuria, hematuria and flank pain.  Musculoskeletal: Positive for joint  swelling, arthralgias and gait problem. Negative for myalgias and back pain.  Skin: Negative.  Negative for rash and wound.  Neurological: Negative for dizziness, tingling, weakness and numbness.  Hematological: Negative for adenopathy. Does not bruise/bleed easily.    Allergies  Codeine  Home Medications   Current Outpatient Rx  Name Route Sig Dispense Refill  . ALPRAZOLAM 1 MG PO TABS Oral Take 1 mg by mouth 4 (four) times daily as needed. FOR ANXIETY     . ASPIRIN EC 81 MG PO TBEC Oral Take 81 mg by mouth daily.      Marland Kitchen FLUTICASONE PROPIONATE 50 MCG/ACT NA SUSP Nasal Place 2 sprays into the nose daily.        BP 132/70  Pulse 84  Temp(Src) 97.4 F (36.3 C) (Oral)  Resp 16  Ht 5\' 9"  (1.753 m)  Wt 195 lb (88.451 kg)  BMI 28.80 kg/m2  SpO2 100%  Physical Exam  Nursing note and vitals reviewed. Constitutional: He is oriented to person, place, and time. He appears well-developed and well-nourished. No distress.  HENT:  Head: Normocephalic and atraumatic.  Mouth/Throat: Oropharynx is clear and moist.  Cardiovascular: Normal rate, regular rhythm and normal heart sounds.   Pulmonary/Chest: Effort normal and breath sounds normal. No respiratory distress. He exhibits no tenderness.  Musculoskeletal: Normal range of motion. He exhibits edema and tenderness.       Left foot: He exhibits tenderness and swelling. He exhibits normal range of motion, no bony tenderness, normal capillary refill, no crepitus, no deformity and no  laceration.       Feet:  Neurological: He is alert and oriented to person, place, and time. No cranial nerve deficit. He exhibits normal muscle tone. Coordination normal.  Skin: Skin is warm and dry.  Psychiatric: He has a normal mood and affect.    ED Course  SPLINT APPLICATION Date/Time: 11/12/2011 9:13 PM Performed by: Trisha Mangle, Janylah Belgrave L. Authorized by: Maxwell Caul Consent: Verbal consent obtained. Written consent not obtained. Consent given by:  patient Patient understanding: patient states understanding of the procedure being performed Patient consent: the patient's understanding of the procedure matches consent given Location: left foot. Splint type: post op shoe. Post-procedure: The splinted body part was neurovascularly unchanged following the procedure. Patient tolerance: Patient tolerated the procedure well with no immediate complications.   (including critical care time)  Dg Foot Complete Left  11/12/2011  *RADIOLOGY REPORT*  Clinical Data: Foot pain  LEFT FOOT - COMPLETE 3+ VIEW  Comparison: None.  Findings: No fracture or dislocation is seen.  The joint spaces are preserved.  The visualized soft tissues are unremarkable.  Small plantar and posterior calcaneal enthesophytes.  IMPRESSION: No acute osseous abnormality is seen.  Original Report Authenticated By: Charline Bills, M.D.        MDM        9:15 PM ttp of the dorsal left foot.  Mild STS.  No bruising, erythema.  No tenderness of the ankle joint or proximal leg.  Likely contusion.  Will apply crutches and post op boot and give ortho referral.    Jadira Nierman L. [place], [place] 11/13/11 [phone]

## 2011-11-12 NOTE — ED Notes (Signed)
Pt states that he was cutting a log yesterday when part of it fell on left foot, positive pedal pulse noted

## 2011-11-13 NOTE — ED Provider Notes (Signed)
Medical screening examination/treatment/procedure(s) were performed by non-physician practitioner and as supervising physician I was immediately available for consultation/collaboration.  Averey Koning, MD 11/13/11 0134 

## 2011-11-16 ENCOUNTER — Emergency Department (HOSPITAL_COMMUNITY)
Admission: EM | Admit: 2011-11-16 | Discharge: 2011-11-16 | Disposition: A | Discharge status: Home / Self Care | Payer: Self-pay | Attending: Emergency Medicine | Admitting: Emergency Medicine

## 2011-11-16 ENCOUNTER — Encounter (HOSPITAL_COMMUNITY): Payer: Self-pay | Admitting: *Deleted

## 2011-11-16 DIAGNOSIS — M25579 Pain in unspecified ankle and joints of unspecified foot: Secondary | ICD-10-CM | POA: Insufficient documentation

## 2011-11-16 DIAGNOSIS — S9030XA Contusion of unspecified foot, initial encounter: Secondary | ICD-10-CM

## 2011-11-16 MED ORDER — HYDROCODONE-ACETAMINOPHEN 7.5-500 MG PO TABS: 1.0000 | ORAL_TABLET | Freq: Four times a day (QID) | ORAL | Status: AC | PRN

## 2011-11-16 NOTE — ED Notes (Signed)
States injured left foot x 5 days ago - seen here and had x-rays.  Given boot that is unable to wear due to pain and swelling.  States pain meds are not working.

## 2011-11-16 NOTE — ED Provider Notes (Signed)
History     CSN: [id number] Arrival date & time: 11/16/2011 10:35 AM   First MD Initiated Contact with Patient 11/16/11 1047      Chief Complaint  Patient presents with  . Foot Pain    (Consider location/radiation/quality/duration/timing/severity/associated sxs/prior treatment) HPI Comments: Seen here 4 days ago after a large tree limb fell on L foot.  Hydrocodone 5/325 "are not helping".  Patient is a 50 y.o. male presenting with lower extremity pain. The history is provided by the patient. No language interpreter was used.  Foot Pain This is a new problem. Episode onset: 4 days ago. The symptoms are aggravated by walking and standing. He has tried oral narcotics for the symptoms. The treatment provided no relief.    Past Medical History  Diagnosis Date  . Stroke   . Hypertension     History reviewed. No pertinent past surgical history.  No family history on file.  History  Substance Use Topics  . Smoking status: Current Everyday Smoker -- 1.0 packs/day    Types: Cigarettes  . Smokeless tobacco: Not on file  . Alcohol Use: No      Review of Systems  Musculoskeletal:       Foot injury  All other systems reviewed and are negative.    Allergies  Codeine  Home Medications   Current Outpatient Rx  Name Route Sig Dispense Refill  . ALPRAZOLAM 1 MG PO TABS Oral Take 1 mg by mouth 4 (four) times daily as needed. FOR ANXIETY     . ASPIRIN EC 81 MG PO TBEC Oral Take 81 mg by mouth daily.      Marland Kitchen FLUTICASONE PROPIONATE 50 MCG/ACT NA SUSP Nasal Place 2 sprays into the nose daily.      Marland Kitchen HYDROCODONE-ACETAMINOPHEN 5-325 MG PO TABS  Take one-two tabs po q 4-6 hrs prn pain 20 tablet 0    BP 119/78  Pulse 71  Temp(Src) 97.5 F (36.4 C) (Oral)  Resp 18  Ht 5\' 9"  (1.753 m)  Wt 195 lb (88.451 kg)  BMI 28.80 kg/m2  SpO2 98%  Physical Exam  Nursing note and vitals reviewed. Constitutional: He is oriented to person, place, and time. He appears well-developed and  well-nourished.  HENT:  Head: Normocephalic and atraumatic.  Eyes: EOM are normal.  Neck: Normal range of motion.  Cardiovascular: Normal rate, regular rhythm, normal heart sounds and intact distal pulses.   Pulmonary/Chest: Effort normal and breath sounds normal. No respiratory distress.  Abdominal: Soft. He exhibits no distension. There is no tenderness.  Musculoskeletal: He exhibits tenderness.       Left foot: He exhibits decreased range of motion, tenderness, bony tenderness and swelling. He exhibits normal capillary refill, no crepitus, no deformity and no laceration.       Feet:  Neurological: He is alert and oriented to person, place, and time.  Skin: Skin is warm and dry.  Psychiatric: He has a normal mood and affect. Judgment normal.    ED Course  Procedures (including critical care time)  Labs Reviewed - No data to display No results found.   No diagnosis found.    MDM          Worthy Rancher, PA 11/16/11 1149

## 2011-11-16 NOTE — ED Provider Notes (Signed)
Evaluation and management procedures were performed by the mid-level provider (PA/NP/CNM) under my supervision/collaboration. I was present and available during the ED course. Nela Bascom Y.   Gavin Pound. Cloee Dunwoody, MD 11/16/11 1152

## 2011-11-30 ENCOUNTER — Emergency Department (HOSPITAL_COMMUNITY): Payer: Self-pay

## 2011-11-30 ENCOUNTER — Encounter (HOSPITAL_COMMUNITY): Payer: Self-pay | Admitting: *Deleted

## 2011-11-30 ENCOUNTER — Emergency Department (HOSPITAL_COMMUNITY)
Admission: EM | Admit: 2011-11-30 | Discharge: 2011-11-30 | Disposition: A | Discharge status: Home / Self Care | Payer: Medicaid Other | Attending: Emergency Medicine | Admitting: Emergency Medicine

## 2011-11-30 DIAGNOSIS — M542 Cervicalgia: Secondary | ICD-10-CM | POA: Insufficient documentation

## 2011-11-30 DIAGNOSIS — F17200 Nicotine dependence, unspecified, uncomplicated: Secondary | ICD-10-CM | POA: Insufficient documentation

## 2011-11-30 DIAGNOSIS — S161XXA Strain of muscle, fascia and tendon at neck level, initial encounter: Secondary | ICD-10-CM

## 2011-11-30 DIAGNOSIS — Z7982 Long term (current) use of aspirin: Secondary | ICD-10-CM | POA: Insufficient documentation

## 2011-11-30 DIAGNOSIS — Y92410 Unspecified street and highway as the place of occurrence of the external cause: Secondary | ICD-10-CM | POA: Insufficient documentation

## 2011-11-30 DIAGNOSIS — S139XXA Sprain of joints and ligaments of unspecified parts of neck, initial encounter: Secondary | ICD-10-CM | POA: Insufficient documentation

## 2011-11-30 MED ORDER — OXYCODONE-ACETAMINOPHEN 5-325 MG PO TABS
1.0000 | ORAL_TABLET | Freq: Once | ORAL | Status: AC
  Administered 2011-11-30: 1 via ORAL
  Filled 2011-11-30: qty 1

## 2011-11-30 MED ORDER — OXYCODONE-ACETAMINOPHEN 5-325 MG PO TABS: 1.0000 | ORAL_TABLET | Freq: Four times a day (QID) | ORAL | Status: AC | PRN
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
## 2011-11-30 NOTE — ED Notes (Signed)
Pt ambulatory with steady gait and no c/os at discharge
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
## 2012-02-04 ENCOUNTER — Encounter (HOSPITAL_COMMUNITY): Payer: Self-pay | Admitting: *Deleted

## 2012-02-04 ENCOUNTER — Emergency Department (HOSPITAL_COMMUNITY)
Admission: EM | Admit: 2012-02-04 | Discharge: 2012-02-04 | Disposition: A | Discharge status: Home / Self Care | Payer: Self-pay | Attending: Emergency Medicine | Admitting: Emergency Medicine

## 2012-02-04 DIAGNOSIS — Z8679 Personal history of other diseases of the circulatory system: Secondary | ICD-10-CM | POA: Insufficient documentation

## 2012-02-04 DIAGNOSIS — I10 Essential (primary) hypertension: Secondary | ICD-10-CM | POA: Insufficient documentation

## 2012-02-04 DIAGNOSIS — K089 Disorder of teeth and supporting structures, unspecified: Secondary | ICD-10-CM | POA: Insufficient documentation

## 2012-02-04 DIAGNOSIS — K0889 Other specified disorders of teeth and supporting structures: Secondary | ICD-10-CM

## 2012-02-04 MED ORDER — HYDROCODONE-ACETAMINOPHEN 5-325 MG PO TABS
2.0000 | ORAL_TABLET | Freq: Once | ORAL | Status: AC
  Administered 2012-02-04: 2 via ORAL
  Filled 2012-02-04: qty 2

## 2012-02-04 MED ORDER — HYDROCODONE-ACETAMINOPHEN 5-325 MG PO TABS: 1.0000 | ORAL_TABLET | ORAL | Status: DC | PRN

## 2012-02-04 MED ORDER — IBUPROFEN 800 MG PO TABS: 800.0000 mg | ORAL_TABLET | Freq: Three times a day (TID) | ORAL | Status: DC

## 2012-02-04 MED ORDER — IBUPROFEN 800 MG PO TABS
800.0000 mg | ORAL_TABLET | Freq: Once | ORAL | Status: AC
  Administered 2012-02-04: 800 mg via ORAL
  Filled 2012-02-04: qty 1

## 2012-02-04 NOTE — Discharge Instructions (Signed)
Dental Pain     Toothache is pain in or around a tooth. It may get worse with chewing or with cold or heat.   HOME CARE  · Your dentist may use a numbing medicine during treatment. If so, you may need to avoid eating until the medicine wears off. Ask your dentist about this.   · Only take medicine as told by your dentist or doctor.   · Avoid chewing food near the painful tooth until after all treatment is done. Ask your dentist about this.   GET HELP RIGHT AWAY IF:   · The problem gets worse or new problems appear.   · You have a fever.   · There is redness and puffiness (swelling) of the face, jaw, or neck.   · You cannot open your mouth.   · There is pain in the jaw.   · There is very bad pain that is not helped by medicine.   MAKE SURE YOU:   · Understand these instructions.   · Will watch your condition.   · Will get help right away if you are not doing well or get worse.   Document Released: 05/26/2008 Document Revised: 08/20/2011 Document Reviewed: 05/26/2008  [employer]® Patient Information ©2012 [employer].

## 2012-02-04 NOTE — ED Notes (Signed)
Dental pain left lower jaw

## 2012-02-04 NOTE — ED Provider Notes (Signed)
History   This chart was scribed for [employer]. Colon Branch, MD by Sofie Rower. The patient was seen in room APA19/APA19 and the patient's care was started at 9:01PM.    CSN: [id number]  Arrival date & time 02/04/12  2047   First MD Initiated Contact with Patient 02/04/12 2054      Chief Complaint  Patient presents with  . Dental Pain    (Consider location/radiation/quality/duration/timing/severity/associated sxs/prior treatment) HPI  Gary Harrington is a 50 y.o. male who presents to the Emergency Department complaining of moderate, constant dental pain located in the lower left jaw with associated symptoms of loss of sleep. Modifying factors include taking of "goody powders" which moderately relives the pain. Pt has a hx of tooth loss on May 2nd, 2012. Pt states he will visit a dentist "as soon as he gets some insurance". 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  Pt denies fever.  Past Medical History  Diagnosis Date  . Stroke   . Hypertension     History reviewed. No pertinent past surgical history.  No family history on file.  History  Substance Use Topics  . Smoking status: Current Everyday Smoker -- 1.0 packs/day    Types: Cigarettes  . Smokeless tobacco: Not on file  . Alcohol Use: No      Review of Systems  All other systems reviewed and are negative.  10 Systems reviewed and are negative for acute change except as noted in the HPI.   Allergies  Codeine  Home Medications   Current Outpatient Rx  Name Route Sig Dispense Refill  . ALPRAZOLAM 1 MG PO TABS Oral Take 1 mg by mouth 4 (four) times daily as needed. FOR ANXIETY     . ASPIRIN EC 81 MG PO TBEC Oral Take 81 mg by mouth daily.      Marland Kitchen FLUTICASONE PROPIONATE 50 MCG/ACT NA SUSP Nasal Place 2 sprays into the nose daily.        BP 115/74  Pulse 88  Temp 97.5 F (36.4 C)  Resp 20  Ht 5\' 9"  (1.753 m)  Wt 200 lb (90.719 kg)  BMI 29.53 kg/m2  SpO2 100%  Physical Exam  Nursing note and  vitals reviewed. Constitutional: He is oriented to person, place, and time. He appears well-developed and well-nourished.  HENT:  Head: Normocephalic and atraumatic.  Nose: Nose normal.       Very poor dentition, with many that are black and broken, #23 broken at the gum line with mild swelling, no erythema.  Eyes: Conjunctivae and EOM are normal. No scleral icterus.  Neck: Normal range of motion. Neck supple. No thyromegaly present.  Cardiovascular: Normal rate.   Pulmonary/Chest: No stridor.  Musculoskeletal: Normal range of motion. He exhibits no edema.  Lymphadenopathy:    He has no cervical adenopathy.  Neurological: He is alert and oriented to person, place, and time. Coordination normal.  Skin: Skin is warm and dry. No rash noted. No erythema.  Psychiatric: He has a normal mood and affect. His behavior is normal.    ED Course  Procedures (including critical care time)  DIAGNOSTIC STUDIES: Oxygen Saturation is 100% on room air, normal by my interpretation.    COORDINATION OF CARE:     COORDINATION OF CARE:     9:06PM- EDP at bedside discusses treatment plan concerning pain management.  MDM  Patient with poor dentition and a broken tooth #23 for a year. Now with pain  to the tooth. No evidence of abscess formation. Given analgesics and antiinflammatory. Pt stable in ED with no significant deterioration in condition.Pt stable in ED with no significant deterioration in condition.The patient appears reasonably screened and/or stabilized for discharge and I doubt any other medical condition or other [facility] requiring further screening, evaluation, or treatment in the ED at this time prior to discharge.  I personally performed the services described in this documentation, which was scribed in my presence. The recorded information has been reviewed and considered.  MDM Reviewed: nursing note and vitals         Nicoletta Dress. Colon Branch, MD 02/04/12 2149

## 2012-02-12 ENCOUNTER — Emergency Department (HOSPITAL_COMMUNITY)
Admission: EM | Admit: 2012-02-12 | Discharge: 2012-02-12 | Disposition: A | Discharge status: Home / Self Care | Payer: Self-pay | Attending: Emergency Medicine | Admitting: Emergency Medicine

## 2012-02-12 ENCOUNTER — Encounter (HOSPITAL_COMMUNITY): Payer: Self-pay | Admitting: *Deleted

## 2012-02-12 DIAGNOSIS — R892 Abnormal level of other drugs, medicaments and biological substances in specimens from other organs, systems and tissues: Secondary | ICD-10-CM | POA: Insufficient documentation

## 2012-02-12 DIAGNOSIS — F17200 Nicotine dependence, unspecified, uncomplicated: Secondary | ICD-10-CM | POA: Insufficient documentation

## 2012-02-12 DIAGNOSIS — I10 Essential (primary) hypertension: Secondary | ICD-10-CM | POA: Insufficient documentation

## 2012-02-12 DIAGNOSIS — Z8679 Personal history of other diseases of the circulatory system: Secondary | ICD-10-CM | POA: Insufficient documentation

## 2012-02-12 DIAGNOSIS — R825 Elevated urine levels of drugs, medicaments and biological substances: Secondary | ICD-10-CM

## 2012-02-12 LAB — RAPID URINE DRUG SCREEN, HOSP PERFORMED
Amphetamines: NOT DETECTED
Barbiturates: NOT DETECTED
Benzodiazepines: POSITIVE — AB
Cocaine: NOT DETECTED

## 2012-02-12 NOTE — ED Notes (Signed)
Pt sent to Lab for drug screen testing.

## 2012-02-12 NOTE — Discharge Instructions (Signed)
The urine drug screen shows benzodiazepines.  Follow up with your MD.

## 2012-02-12 NOTE — ED Notes (Signed)
Pt seen by PA for initial assessment.

## 2012-02-12 NOTE — ED Provider Notes (Signed)
History     CSN: [id number]  Arrival date & time 02/12/12  1912   None     Chief Complaint  Patient presents with  . Drug / Alcohol Assessment    (Consider location/radiation/quality/duration/timing/severity/associated sxs/prior treatment) HPI Comments: The pt and his wife went to see dr. Milda Smart?) their PCP who prescribes xanax to both of them.  The office drug tested his wife and found no benzos in her urine.  The MD accused them of selling their xanax.  The pt says he takes his meds as prescribed and came here to get a urine drug test to prove it to his MD.  Patient is a 51 y.o. male presenting with drug/alcohol assessment. The history is provided by the patient. No language interpreter was used.  Drug / Alcohol Assessment Primary symptoms include agitation.    Past Medical History  Diagnosis Date  . Stroke   . Hypertension     History reviewed. No pertinent past surgical history.  History reviewed. No pertinent family history.  History  Substance Use Topics  . Smoking status: Current Everyday Smoker -- 1.0 packs/day    Types: Cigarettes  . Smokeless tobacco: Not on file  . Alcohol Use: No      Review of Systems  Psychiatric/Behavioral: Positive for agitation.  All other systems reviewed and are negative.    Allergies  Codeine and Penicillins  Home Medications   Current Outpatient Rx  Name Route Sig Dispense Refill  . ACETAMINOPHEN 500 MG PO TABS Oral Take 1,000 mg by mouth every 6 (six) hours as needed. For pain    . ALBUTEROL SULFATE HFA 108 (90 BASE) MCG/ACT IN AERS Inhalation Inhale 2 puffs into the lungs every 6 (six) hours as needed.    . ALPRAZOLAM 1 MG PO TABS Oral Take 1 mg by mouth 4 (four) times daily as needed. FOR ANXIETY     . ASPIRIN EC 81 MG PO TBEC Oral Take 81 mg by mouth daily.        BP 130/78  Pulse 81  Temp(Src) 98.1 F (36.7 C) (Oral)  Resp 18  SpO2 99%  Physical Exam  Constitutional: He is oriented to person, place,  and time. He appears well-developed and well-nourished.  HENT:  Head: Normocephalic.  Eyes: EOM are normal.  Neck: Normal range of motion.  Pulmonary/Chest: Effort normal.  Musculoskeletal: Normal range of motion.  Neurological: He is alert and oriented to person, place, and time.  Psychiatric: Thought content normal. His affect is angry. He is agitated.    ED Course  Procedures (including critical care time)   Labs Reviewed  URINE RAPID DRUG SCREEN (HOSP PERFORMED)   No results found.   No diagnosis found.    MDM          Worthy Rancher, PA 02/12/12 2322

## 2012-02-12 NOTE — ED Notes (Signed)
Pt awaiting test results. 

## 2012-02-12 NOTE — ED Notes (Signed)
Pt awaiting lab results on drug screen.

## 2012-02-12 NOTE — ED Notes (Signed)
Pt wants a drug test .  Accused of selling his meds

## 2012-02-15 NOTE — ED Provider Notes (Signed)
Medical screening examination/treatment/procedure(s) were performed by non-physician practitioner and as supervising physician I was immediately available for consultation/collaboration.   Reona Zendejas L Layanna Charo, MD 02/15/12 2320 

## 2012-02-22 ENCOUNTER — Encounter (HOSPITAL_COMMUNITY): Payer: Self-pay

## 2012-02-22 ENCOUNTER — Emergency Department (HOSPITAL_COMMUNITY)
Admission: EM | Admit: 2012-02-22 | Discharge: 2012-02-22 | Disposition: A | Discharge status: Home / Self Care | Payer: Self-pay | Attending: Emergency Medicine | Admitting: Emergency Medicine

## 2012-02-22 DIAGNOSIS — R6884 Jaw pain: Secondary | ICD-10-CM | POA: Insufficient documentation

## 2012-02-22 DIAGNOSIS — I10 Essential (primary) hypertension: Secondary | ICD-10-CM | POA: Insufficient documentation

## 2012-02-22 DIAGNOSIS — Z8673 Personal history of transient ischemic attack (TIA), and cerebral infarction without residual deficits: Secondary | ICD-10-CM | POA: Insufficient documentation

## 2012-02-22 DIAGNOSIS — K029 Dental caries, unspecified: Secondary | ICD-10-CM | POA: Insufficient documentation

## 2012-02-22 DIAGNOSIS — K089 Disorder of teeth and supporting structures, unspecified: Secondary | ICD-10-CM | POA: Insufficient documentation

## 2012-02-22 DIAGNOSIS — K0889 Other specified disorders of teeth and supporting structures: Secondary | ICD-10-CM

## 2012-02-22 DIAGNOSIS — Z79899 Other long term (current) drug therapy: Secondary | ICD-10-CM | POA: Insufficient documentation

## 2012-02-22 DIAGNOSIS — Z7982 Long term (current) use of aspirin: Secondary | ICD-10-CM | POA: Insufficient documentation

## 2012-02-22 MED ORDER — PENICILLIN V POTASSIUM 500 MG PO TABS: 500.0000 mg | ORAL_TABLET | Freq: Four times a day (QID) | ORAL | Status: AC

## 2012-02-22 MED ORDER — HYDROCODONE-ACETAMINOPHEN 5-325 MG PO TABS: 1.0000 | ORAL_TABLET | Freq: Four times a day (QID) | ORAL | Status: AC | PRN

## 2012-02-22 NOTE — ED Provider Notes (Signed)
History     CSN: [id number]  Arrival date & time 02/22/12  0945   First MD Initiated Contact with Patient 02/22/12 1002      Chief Complaint  Patient presents with  . Dental Pain    (Consider location/radiation/quality/duration/timing/severity/associated sxs/prior treatment) HPI Comments: Pt has had toothache since may of 2012.  "i plan to see dr. Retta Mac when i get my tax refund".   Patient is a 51 y.o. male presenting with tooth pain. The history is provided by the patient. No language interpreter was used.  Dental PainThe primary symptoms include mouth pain. Primary symptoms do not include dental injury or fever. The symptoms are unchanged. The symptoms occur constantly.  Additional symptoms include: jaw pain.    Past Medical History  Diagnosis Date  . Stroke   . Hypertension     History reviewed. No pertinent past surgical history.  No family history on file.  History  Substance Use Topics  . Smoking status: Current Everyday Smoker -- 1.0 packs/day    Types: Cigarettes  . Smokeless tobacco: Not on file  . Alcohol Use: No      Review of Systems  Constitutional: Negative for fever.  HENT: Positive for dental problem.   All other systems reviewed and are negative.    Allergies  Codeine  Home Medications   Current Outpatient Rx  Name Route Sig Dispense Refill  . ACETAMINOPHEN 500 MG PO TABS Oral Take 1,000 mg by mouth every 6 (six) hours as needed. For pain    . ALBUTEROL SULFATE HFA 108 (90 BASE) MCG/ACT IN AERS Inhalation Inhale 2 puffs into the lungs every 6 (six) hours as needed.    . ALPRAZOLAM 1 MG PO TABS Oral Take 1 mg by mouth 4 (four) times daily as needed. FOR ANXIETY     . ASPIRIN EC 81 MG PO TBEC Oral Take 81 mg by mouth daily.      Marland Kitchen HYDROCODONE-ACETAMINOPHEN 5-325 MG PO TABS Oral Take 1 tablet by mouth every 6 (six) hours as needed for pain. 10 tablet 0  . PENICILLIN V POTASSIUM 500 MG PO TABS Oral Take 1 tablet (500 mg total) by mouth 4  (four) times daily. 40 tablet 0    BP 127/81  Pulse 74  Temp(Src) 97.7 F (36.5 C) (Oral)  Resp 18  Ht 5\' 8"  (1.727 m)  Wt 200 lb (90.719 kg)  BMI 30.41 kg/m2  SpO2 100%  Physical Exam  Nursing note and vitals reviewed. Constitutional: He is oriented to person, place, and time. He appears well-developed and well-nourished.  HENT:  Head: Normocephalic and atraumatic.  Mouth/Throat: Uvula is midline. Abnormal dentition. Dental caries present. No uvula swelling.    Eyes: EOM are normal.  Neck: Normal range of motion.  Cardiovascular: Normal rate, regular rhythm, normal heart sounds and intact distal pulses.   Pulmonary/Chest: Effort normal and breath sounds normal. No respiratory distress.  Abdominal: Soft. He exhibits no distension. There is no tenderness.  Musculoskeletal: Normal range of motion.  Neurological: He is alert and oriented to person, place, and time.  Skin: Skin is warm and dry.  Psychiatric: He has a normal mood and affect. Judgment normal.    ED Course  Procedures (including critical care time)  Labs Reviewed - No data to display No results found.   1. Pain, dental       MDM  Vicodin, penicillin.  F/u with dentist.        Worthy Rancher, PA 02/22/12 1034  Worthy Rancher, PA 02/27/12 [phone]

## 2012-02-22 NOTE — Discharge Instructions (Signed)
Dental Pain A tooth ache may be caused by cavities (tooth decay). Cavities expose the nerve of the tooth to air and hot or cold temperatures. It may come from an infection or abscess (also called a boil or furuncle) around your tooth. It is also often caused by dental caries (tooth decay). This causes the pain you are having. DIAGNOSIS  Your caregiver can diagnose this problem by exam. TREATMENT   If caused by an infection, it may be treated with medications which kill germs (antibiotics) and pain medications as prescribed by your caregiver. Take medications as directed.   Only take over-the-counter or prescription medicines for pain, discomfort, or fever as directed by your caregiver.   Whether the tooth ache today is caused by infection or dental disease, you should see your dentist as soon as possible for further care.  SEEK MEDICAL CARE IF: The exam and treatment you received today has been provided on an emergency basis only. This is not a substitute for complete medical or dental care. If your problem worsens or new problems (symptoms) appear, and you are unable to meet with your dentist, call or return to this location. SEEK IMMEDIATE MEDICAL CARE IF:   You have a fever.   You develop redness and swelling of your face, jaw, or neck.   You are unable to open your mouth.   You have severe pain uncontrolled by pain medicine.  MAKE SURE YOU:   Understand these instructions.   Will watch your condition.   Will get help right away if you are not doing well or get worse.  Document Released: 12/08/2005 Document Revised: 11/27/2011 Document Reviewed: 07/26/2008 [facility] Patient Information 2012 [place], [place].   Take the meds as directed.  Follow up with the dentist of your choice

## 2012-02-22 NOTE — ED Notes (Signed)
Complain of dental pain since last may when a tooth broke

## 2012-02-28 NOTE — ED Provider Notes (Signed)
Medical screening examination/treatment/procedure(s) were performed by non-physician practitioner and as supervising physician I was immediately available for consultation/collaboration.  Nicoletta Dress. Colon Branch, MD 02/28/12 1427

## 2012-04-03 ENCOUNTER — Encounter (HOSPITAL_COMMUNITY): Payer: Self-pay | Admitting: Emergency Medicine

## 2012-04-03 ENCOUNTER — Emergency Department (HOSPITAL_COMMUNITY)
Admission: EM | Admit: 2012-04-03 | Discharge: 2012-04-03 | Disposition: A | Discharge status: Home / Self Care | Payer: Self-pay | Attending: Emergency Medicine | Admitting: Emergency Medicine

## 2012-04-03 DIAGNOSIS — F17200 Nicotine dependence, unspecified, uncomplicated: Secondary | ICD-10-CM | POA: Insufficient documentation

## 2012-04-03 DIAGNOSIS — I10 Essential (primary) hypertension: Secondary | ICD-10-CM | POA: Insufficient documentation

## 2012-04-03 DIAGNOSIS — G8929 Other chronic pain: Secondary | ICD-10-CM

## 2012-04-03 DIAGNOSIS — K000 Anodontia: Secondary | ICD-10-CM | POA: Insufficient documentation

## 2012-04-03 DIAGNOSIS — K089 Disorder of teeth and supporting structures, unspecified: Secondary | ICD-10-CM | POA: Insufficient documentation

## 2012-04-03 DIAGNOSIS — K029 Dental caries, unspecified: Secondary | ICD-10-CM | POA: Insufficient documentation

## 2012-04-03 DIAGNOSIS — Z8673 Personal history of transient ischemic attack (TIA), and cerebral infarction without residual deficits: Secondary | ICD-10-CM | POA: Insufficient documentation

## 2012-04-03 MED ORDER — HYDROCODONE-ACETAMINOPHEN 5-325 MG PO TABS: ORAL_TABLET | ORAL | Status: AC

## 2012-04-03 MED ORDER — PENICILLIN V POTASSIUM 500 MG PO TABS: 500.0000 mg | ORAL_TABLET | Freq: Four times a day (QID) | ORAL | Status: AC

## 2012-04-03 MED ORDER — PENICILLIN V POTASSIUM 250 MG PO TABS
500.0000 mg | ORAL_TABLET | Freq: Once | ORAL | Status: AC
  Administered 2012-04-03: 500 mg via ORAL
  Filled 2012-04-03: qty 2

## 2012-04-03 MED ORDER — HYDROCODONE-ACETAMINOPHEN 5-325 MG PO TABS
1.0000 | ORAL_TABLET | Freq: Once | ORAL | Status: AC
  Administered 2012-04-03: 1 via ORAL
  Filled 2012-04-03: qty 1

## 2012-04-03 NOTE — ED Provider Notes (Signed)
History     CSN: [id number]  Arrival date & time 04/03/12  Gary Harrington   First MD Initiated Contact with Patient 04/03/12 1948      Chief Complaint  Patient presents with  . Dental Pain    (Consider location/radiation/quality/duration/timing/severity/associated sxs/prior treatment) Patient is a 51 y.o. male presenting with tooth pain. The history is provided by the patient.  Dental PainThe primary symptoms include mouth pain. Primary symptoms do not include dental injury, oral bleeding, headaches, fever, sore throat or angioedema. The symptoms began more than 1 week ago. The symptoms are worsening. The symptoms are recurrent. The symptoms occur constantly.  Additional symptoms include: dental sensitivity to temperature and gum tenderness. Additional symptoms do not include: gum swelling, purulent gums, trismus, jaw pain, facial swelling, trouble swallowing, drooling, ear pain and swollen glands. Medical issues include: smoking and periodontal disease.    Past Medical History  Diagnosis Date  . Stroke   . Hypertension     History reviewed. No pertinent past surgical history.  Family History  Problem Relation Age of Onset  . Stroke Father   . Heart failure Father     History  Substance Use Topics  . Smoking status: Current Everyday Smoker -- 0.5 packs/day    Types: Cigarettes  . Smokeless tobacco: Not on file  . Alcohol Use: No      Review of Systems  Constitutional: Negative for fever and appetite change.  HENT: Positive for dental problem. Negative for ear pain, sore throat, facial swelling, drooling and trouble swallowing.   Skin: Negative.   Neurological: Negative for dizziness and headaches.  Hematological: Negative for adenopathy.  All other systems reviewed and are negative.    Allergies  Codeine  Home Medications   Current Outpatient Rx  Name Route Sig Dispense Refill  . ACETAMINOPHEN 500 MG PO TABS Oral Take 1,000 mg by mouth every 6 (six) hours as needed.  For pain    . ALBUTEROL SULFATE HFA 108 (90 BASE) MCG/ACT IN AERS Inhalation Inhale 2 puffs into the lungs every 6 (six) hours as needed.    . ALPRAZOLAM 1 MG PO TABS Oral Take 1 mg by mouth 4 (four) times daily as needed. FOR ANXIETY     . ASPIRIN EC 81 MG PO TBEC Oral Take 81 mg by mouth daily.        BP 117/73  Pulse 82  Temp(Src) 98.2 F (36.8 C) (Oral)  Resp 14  Ht 5\' 9"  (1.753 m)  Wt 200 lb (90.719 kg)  BMI 29.53 kg/m2  SpO2 99%  Physical Exam  Nursing note and vitals reviewed. Constitutional: He is oriented to person, place, and time. He appears well-developed and well-nourished. No distress.  HENT:  Head: Normocephalic and atraumatic. No trismus in the jaw.  Mouth/Throat: Uvula is midline, oropharynx is clear and moist and mucous membranes are normal. Abnormal dentition. Dental caries present. No uvula swelling.       Multiple dental caries and missing teeth.  ttp of the gums along the left lower teeth  Neck: Normal range of motion.  Cardiovascular: Normal rate, regular rhythm and normal heart sounds.   Pulmonary/Chest: Effort normal and breath sounds normal.  Lymphadenopathy:    He has no cervical adenopathy.  Neurological: He is alert and oriented to person, place, and time. He exhibits normal muscle tone. Coordination normal.  Skin: Skin is warm and dry.    ED Course  Procedures (including critical care time)       MDM  Previous ED charts and nursing notes were reviewed by me. Patient was seen here 02/22/2012 for similar symptoms.  Patient is alert. Nontoxic appearing. He has widespread dental disease with discoloration of the remaining teeth tenderness to the gums bilaterally. No facial edema, trismus, or dental abscess. Patient has been seen here on times for similar complaints. Have advised him that he will need further management with the dentist.      Allis Quirarte L. Shawniece Oyola, [place] 04/08/12 1544

## 2012-04-03 NOTE — ED Notes (Signed)
Pt c/o L lower jaw dental pain. 

## 2012-04-08 NOTE — ED Provider Notes (Signed)
Medical screening examination/treatment/procedure(s) were performed by non-physician practitioner and as supervising physician I was immediately available for consultation/collaboration.  Shelda Jakes, MD 04/08/12 [phone]

## 2012-08-20 ENCOUNTER — Encounter (HOSPITAL_COMMUNITY): Payer: Self-pay | Admitting: *Deleted

## 2012-08-20 ENCOUNTER — Emergency Department (HOSPITAL_COMMUNITY)
Admission: EM | Admit: 2012-08-20 | Discharge: 2012-08-20 | Disposition: A | Discharge status: Home / Self Care | Payer: Self-pay | Attending: Emergency Medicine | Admitting: Emergency Medicine

## 2012-08-20 DIAGNOSIS — I10 Essential (primary) hypertension: Secondary | ICD-10-CM | POA: Insufficient documentation

## 2012-08-20 DIAGNOSIS — F17200 Nicotine dependence, unspecified, uncomplicated: Secondary | ICD-10-CM | POA: Insufficient documentation

## 2012-08-20 DIAGNOSIS — Z8673 Personal history of transient ischemic attack (TIA), and cerebral infarction without residual deficits: Secondary | ICD-10-CM | POA: Insufficient documentation

## 2012-08-20 DIAGNOSIS — K047 Periapical abscess without sinus: Secondary | ICD-10-CM | POA: Insufficient documentation

## 2012-08-20 MED ORDER — HYDROCODONE-ACETAMINOPHEN 5-325 MG PO TABS: 2.0000 | ORAL_TABLET | ORAL | Status: AC | PRN

## 2012-08-20 MED ORDER — CLINDAMYCIN HCL 150 MG PO CAPS: 150.0000 mg | ORAL_CAPSULE | Freq: Four times a day (QID) | ORAL | Status: AC

## 2012-08-20 MED ORDER — HYDROCODONE-ACETAMINOPHEN 5-325 MG PO TABS: 1.0000 | ORAL_TABLET | ORAL | Status: AC | PRN

## 2012-08-20 NOTE — ED Provider Notes (Signed)
History     CSN: [id number]  Arrival date & time 08/20/12  2050   First MD Initiated Contact with Patient 08/20/12 2116      Chief Complaint  Patient presents with  . Dental Pain    (Consider location/radiation/quality/duration/timing/severity/associated sxs/prior treatment) HPI Comments: Gary Harrington presents with severe dental pain for the past 3 days.  He has multiple fractured and decayed teeth and he developed worsened pain in his left lower lateral incisor 3 days ago.  He denies any specific new injury to this tooth, but has surrounding swelling and exquisite pain.  He denies fevers or chills.  Pain is constant and sharp and worse with eating and with increased sensitivity to cold.  The history is provided by the patient.    Past Medical History  Diagnosis Date  . Stroke   . Hypertension     History reviewed. No pertinent past surgical history.  Family History  Problem Relation Age of Onset  . Stroke Father   . Heart failure Father     History  Substance Use Topics  . Smoking status: Current Everyday Smoker -- 0.5 packs/day    Types: Cigarettes  . Smokeless tobacco: Not on file  . Alcohol Use: No      Review of Systems  Constitutional: Negative for fever.  HENT: Positive for dental problem. Negative for sore throat, facial swelling, neck pain and neck stiffness.   Respiratory: Negative for shortness of breath.     Allergies  Codeine and Penicillins  Home Medications   Current Outpatient Rx  Name Route Sig Dispense Refill  . ALPRAZOLAM 2 MG PO TABS Oral Take 2 mg by mouth 4 (four) times daily.    . ASPIRIN EC 81 MG PO TBEC Oral Take 81 mg by mouth daily.      Marlin Canary HEADACHE PO Oral Take 2 packets by mouth 3 (three) times daily as needed. For pain    . CYCLOBENZAPRINE HCL 10 MG PO TABS Oral Take 10 mg by mouth 2 (two) times daily as needed. For spasms or pain    . ALBUTEROL SULFATE HFA 108 (90 BASE) MCG/ACT IN AERS Inhalation Inhale 2 puffs into  the lungs every 6 (six) hours as needed.    Marland Kitchen CLINDAMYCIN HCL 150 MG PO CAPS Oral Take 1 capsule (150 mg total) by mouth every 6 (six) hours. 28 capsule 0  . HYDROCODONE-ACETAMINOPHEN 5-325 MG PO TABS Oral Take 2 tablets by mouth every 4 (four) hours as needed for pain. 6 tablet 0  . HYDROCODONE-ACETAMINOPHEN 5-325 MG PO TABS Oral Take 1 tablet by mouth every 4 (four) hours as needed for pain. 15 tablet 0    BP 121/72  Pulse 74  Temp 97.8 F (36.6 C) (Oral)  Resp 18  Ht 5\' 9"  (1.753 m)  Wt 205 lb (92.987 kg)  BMI 30.27 kg/m2  SpO2 99%  Physical Exam  Constitutional: He is oriented to person, place, and time. He appears well-developed and well-nourished. No distress.  HENT:  Head: Normocephalic and atraumatic. No trismus in the jaw.  Right Ear: Tympanic membrane and external ear normal.  Left Ear: Tympanic membrane and external ear normal.  Mouth/Throat: Oropharynx is clear and moist and mucous membranes are normal. No oral lesions. Dental abscesses present.    Eyes: Conjunctivae are normal.  Neck: Normal range of motion. Neck supple.  Cardiovascular: Normal rate and normal heart sounds.   Pulmonary/Chest: Effort normal.  Musculoskeletal: Normal range of motion.  Lymphadenopathy:  Head (right side): No submandibular adenopathy present.       Head (left side): No submandibular adenopathy present.    He has no cervical adenopathy.  Neurological: He is alert and oriented to person, place, and time.  Skin: Skin is warm and dry. No erythema.  Psychiatric: He has a normal mood and affect.    ED Course  Procedures (including critical care time)  Labs Reviewed - No data to display No results found.   1. Dental abscess       MDM  Patient prescribed clindamycin,  Hydrocodone along with pre pack of hydrocodone.  Dental referral given including free dental clinic next month in [place].          Burgess Amor, [place] 08/20/12 2245

## 2012-08-20 NOTE — ED Notes (Signed)
Dental pain for 3 days  

## 2012-08-21 NOTE — ED Provider Notes (Signed)
Medical screening examination/treatment/procedure(s) were performed by non-physician practitioner and as supervising physician I was immediately available for consultation/collaboration.   Daouda Lonzo L Tylasia Fletchall, MD 08/21/12 1934 

## 2012-08-24 MED FILL — Hydrocodone-Acetaminophen Tab 5-325 MG: ORAL | Qty: 6 | Status: AC

## 2012-10-01 ENCOUNTER — Emergency Department (HOSPITAL_COMMUNITY)
Admission: EM | Admit: 2012-10-01 | Discharge: 2012-10-01 | Disposition: A | Discharge status: Home / Self Care | Payer: Self-pay | Attending: Emergency Medicine | Admitting: Emergency Medicine

## 2012-10-01 ENCOUNTER — Encounter (HOSPITAL_COMMUNITY): Payer: Self-pay | Admitting: *Deleted

## 2012-10-01 DIAGNOSIS — F17200 Nicotine dependence, unspecified, uncomplicated: Secondary | ICD-10-CM | POA: Insufficient documentation

## 2012-10-01 DIAGNOSIS — Z8673 Personal history of transient ischemic attack (TIA), and cerebral infarction without residual deficits: Secondary | ICD-10-CM | POA: Insufficient documentation

## 2012-10-01 DIAGNOSIS — L089 Local infection of the skin and subcutaneous tissue, unspecified: Secondary | ICD-10-CM

## 2012-10-01 DIAGNOSIS — Z7982 Long term (current) use of aspirin: Secondary | ICD-10-CM | POA: Insufficient documentation

## 2012-10-01 DIAGNOSIS — M79609 Pain in unspecified limb: Secondary | ICD-10-CM | POA: Insufficient documentation

## 2012-10-01 DIAGNOSIS — Z880 Allergy status to penicillin: Secondary | ICD-10-CM | POA: Insufficient documentation

## 2012-10-01 DIAGNOSIS — Z885 Allergy status to narcotic agent status: Secondary | ICD-10-CM | POA: Insufficient documentation

## 2012-10-01 DIAGNOSIS — I10 Essential (primary) hypertension: Secondary | ICD-10-CM | POA: Insufficient documentation

## 2012-10-01 MED ORDER — ONDANSETRON HCL 4 MG PO TABS
4.0000 mg | ORAL_TABLET | Freq: Once | ORAL | Status: AC
  Administered 2012-10-01: 4 mg via ORAL
  Filled 2012-10-01: qty 1

## 2012-10-01 MED ORDER — CIPROFLOXACIN HCL 250 MG PO TABS
500.0000 mg | ORAL_TABLET | Freq: Once | ORAL | Status: AC
  Administered 2012-10-01: 500 mg via ORAL
  Filled 2012-10-01: qty 2

## 2012-10-01 MED ORDER — MELOXICAM 7.5 MG PO TABS: ORAL_TABLET | ORAL | Status: DC

## 2012-10-01 MED ORDER — HYDROCODONE-ACETAMINOPHEN 5-325 MG PO TABS: ORAL_TABLET | ORAL | Status: DC

## 2012-10-01 MED ORDER — SULFAMETHOXAZOLE-TMP DS 800-160 MG PO TABS
1.0000 | ORAL_TABLET | Freq: Once | ORAL | Status: AC
  Administered 2012-10-01: 1 via ORAL
  Filled 2012-10-01: qty 1

## 2012-10-01 MED ORDER — CIPROFLOXACIN HCL 500 MG PO TABS: 500.0000 mg | ORAL_TABLET | Freq: Two times a day (BID) | ORAL | Status: DC

## 2012-10-01 MED ORDER — HYDROCODONE-ACETAMINOPHEN 5-325 MG PO TABS
2.0000 | ORAL_TABLET | Freq: Once | ORAL | Status: AC
  Administered 2012-10-01: 2 via ORAL
  Filled 2012-10-01: qty 2

## 2012-10-01 MED ORDER — SULFAMETHOXAZOLE-TRIMETHOPRIM 800-160 MG PO TABS: 1.0000 | ORAL_TABLET | Freq: Two times a day (BID) | ORAL | Status: DC

## 2012-10-01 NOTE — ED Notes (Signed)
Lt thumb pain with infection

## 2012-10-01 NOTE — Discharge Instructions (Signed)
Please soak your hand in warm Epsom salt water for 15 minutes daily until the infection goes away. Please use Septra 2 times daily after eating, and Cipro 2 times daily after eating. Use Mobic 2 times daily for inflammation and swelling, use Norco for pain. Norco may cause drowsiness, and/or constipation, please use with caution. Please elevate your hand above your heart is much as possible.Skin Infections A skin infection usually develops as a result of disruption of the skin barrier.  CAUSES  A skin infection might occur following:  Trauma or an injury to the skin such as a cut or insect sting.  Inflammation (as in eczema).  Breaks in the skin between the toes (as in athlete's foot).  Swelling (edema). SYMPTOMS  The legs are the most common site affected. Usually there is:  Redness.  Swelling.  Pain.  There may be red streaks in the area of the infection. TREATMENT   Minor skin infections may be treated with topical antibiotics, but if the skin infection is severe, hospital care and intravenous (IV) antibiotic treatment may be needed.  Most often skin infections can be treated with oral antibiotic medicine as well as proper rest and elevation of the affected area until the infection improves.  If you are prescribed oral antibiotics, it is important to take them as directed and to take all the pills even if you feel better before you have finished all of the medicine.  You may apply warm compresses to the area for 20-30 minutes 4 times daily. You might need a tetanus shot now if:  You have no idea when you had the last one.  You have never had a tetanus shot before.  Your wound had dirt in it. If you need a tetanus shot and you decide not to get one, there is a rare chance of getting tetanus. Sickness from tetanus can be serious. If you get a tetanus shot, your arm may swell and become red and warm at the shot site. This is common and not a problem. SEEK MEDICAL CARE IF:  The  pain and swelling from your infection do not improve within 2 days.  SEEK IMMEDIATE MEDICAL CARE IF:  You develop a fever, chills, or other serious problems.  Document Released: 01/15/2005 Document Revised: 03/01/2012 Document Reviewed: 11/27/2008 [facility] Patient Information 2013 [place], [place].

## 2012-10-01 NOTE — ED Notes (Signed)
Pt reports left thumb swelling & sore to the touch x 2 days.

## 2012-10-01 NOTE — ED Provider Notes (Signed)
History     CSN: [id number]  Arrival date & time 10/01/12  4098   First MD Initiated Contact with Patient 10/01/12 1923      Chief Complaint  Patient presents with  . Hand Pain    (Consider location/radiation/quality/duration/timing/severity/associated sxs/prior treatment) Patient is a 51 y.o. male presenting with hand pain. The history is provided by the patient.  Hand Pain This is a new problem. The current episode started in the past 7 days. The problem occurs constantly. The problem has been gradually worsening. Associated symptoms include arthralgias. Pertinent negatives include no abdominal pain, chest pain, chills, coughing, fever, neck pain or vomiting. Associated symptoms comments: Pain of the left thumb.. The symptoms are aggravated by bending (palpation of the thumb). He has tried nothing for the symptoms. The treatment provided no relief.    Past Medical History  Diagnosis Date  . Stroke   . Hypertension     History reviewed. No pertinent past surgical history.  Family History  Problem Relation Age of Onset  . Stroke Father   . Heart failure Father     History  Substance Use Topics  . Smoking status: Current Every Day Smoker -- 0.5 packs/day    Types: Cigarettes  . Smokeless tobacco: Not on file  . Alcohol Use: No      Review of Systems  Constitutional: Negative for fever, chills and activity change.       All ROS Neg except as noted in HPI  HENT: Negative for nosebleeds and neck pain.   Eyes: Negative for photophobia and discharge.  Respiratory: Negative for cough, shortness of breath and wheezing.   Cardiovascular: Negative for chest pain and palpitations.  Gastrointestinal: Negative for vomiting, abdominal pain and blood in stool.  Genitourinary: Negative for dysuria, frequency and hematuria.  Musculoskeletal: Positive for arthralgias. Negative for back pain.  Skin: Negative.   Neurological: Negative for dizziness, seizures and speech difficulty.   Psychiatric/Behavioral: Negative for hallucinations and confusion.    Allergies  Codeine and Penicillins  Home Medications   Current Outpatient Rx  Name Route Sig Dispense Refill  . ALBUTEROL SULFATE HFA 108 (90 BASE) MCG/ACT IN AERS Inhalation Inhale 2 puffs into the lungs every 6 (six) hours as needed.    . ALPRAZOLAM 2 MG PO TABS Oral Take 2 mg by mouth 4 (four) times daily.    . ASPIRIN EC 81 MG PO TBEC Oral Take 81 mg by mouth daily.      Marlin Canary HEADACHE PO Oral Take 2 packets by mouth 3 (three) times daily as needed. For pain    . CYCLOBENZAPRINE HCL 10 MG PO TABS Oral Take 10 mg by mouth 2 (two) times daily as needed. For spasms or pain      BP 113/75  Pulse 87  Temp 97.4 F (36.3 C) (Oral)  Resp 18  Ht 5\' 9"  (1.753 m)  Wt 205 lb (92.987 kg)  BMI 30.27 kg/m2  SpO2 99%  Physical Exam  Nursing note and vitals reviewed. Constitutional: He is oriented to person, place, and time. He appears well-developed and well-nourished.  Non-toxic appearance.  HENT:  Head: Normocephalic.  Right Ear: Tympanic membrane and external ear normal.  Left Ear: Tympanic membrane and external ear normal.  Eyes: EOM and lids are normal. Pupils are equal, round, and reactive to light.  Neck: Normal range of motion. Neck supple. Carotid bruit is not present.  Cardiovascular: Normal rate, regular rhythm, normal heart sounds, intact distal pulses and normal pulses.  Pulmonary/Chest: Breath sounds normal. No respiratory distress.  Abdominal: Soft. Bowel sounds are normal. There is no tenderness. There is no guarding.  Musculoskeletal: Normal range of motion.       There is mild-to-moderate swelling of the medial and lateral distal portion of the left thumb. There is no red streaking noted of the thumb or any of the other fingers. There is good range of motion of the left thumb. Is full range of motion of the left wrist, elbow, and shoulder. There is no biceps triceps or axillary nodes appreciated.  The thumb is not hot.  Lymphadenopathy:       Head (right side): No submandibular adenopathy present.       Head (left side): No submandibular adenopathy present.    He has no cervical adenopathy.  Neurological: He is alert and oriented to person, place, and time. He has normal strength. No cranial nerve deficit or sensory deficit.  Skin: Skin is warm and dry.  Psychiatric: He has a normal mood and affect. His speech is normal.    ED Course  Procedures (including critical care time)  Labs Reviewed - No data to display No results found.   No diagnosis found.    MDM  I have reviewed nursing notes, vital signs, and all appropriate lab and imaging results for this patient. Patient was trimming his nails with a dirty pocket knife and approximately 4 days later he had pain and swelling of the left thumb at the distal portion. There is tenderness to palpation of the lateral and medial aspect of the left thumb. There is no red streaking appreciated the thumb is not hot. The vital signs are stable. The patient will be treated with Cipro 2 times daily and Septra 2 times daily.Pt to soak the area in warm water for daily. Prescription for Norco one every 4 hours also given to the patient (#20 tablets) patient is to see his primary physician or return to the emergency department if not improving.       Kathie Dike, [place] 10/01/12 1950

## 2012-10-01 NOTE — ED Notes (Signed)
Pt alert & oriented x4, stable gait. Patient given discharge instructions, paperwork & prescription(s). Patient  instructed to stop at the registration desk to finish any additional paperwork. Patient verbalized understanding. Pt left department w/ no further questions. 

## 2012-10-02 NOTE — ED Provider Notes (Signed)
Medical screening examination/treatment/procedure(s) were performed by non-physician practitioner and as supervising physician I was immediately available for consultation/collaboration. Dawon Troop, MD, FACEP   Tonee Silverstein L Rochele Lueck, MD 10/02/12 0913 

## 2012-11-05 ENCOUNTER — Encounter (HOSPITAL_COMMUNITY): Payer: Self-pay | Admitting: Emergency Medicine

## 2012-11-05 ENCOUNTER — Emergency Department (HOSPITAL_COMMUNITY)
Admission: EM | Admit: 2012-11-05 | Discharge: 2012-11-05 | Disposition: A | Discharge status: Home / Self Care | Payer: Self-pay | Attending: Emergency Medicine | Admitting: Emergency Medicine

## 2012-11-05 DIAGNOSIS — K089 Disorder of teeth and supporting structures, unspecified: Secondary | ICD-10-CM | POA: Insufficient documentation

## 2012-11-05 DIAGNOSIS — Z79899 Other long term (current) drug therapy: Secondary | ICD-10-CM | POA: Insufficient documentation

## 2012-11-05 DIAGNOSIS — I10 Essential (primary) hypertension: Secondary | ICD-10-CM | POA: Insufficient documentation

## 2012-11-05 DIAGNOSIS — Z7982 Long term (current) use of aspirin: Secondary | ICD-10-CM | POA: Insufficient documentation

## 2012-11-05 DIAGNOSIS — K0889 Other specified disorders of teeth and supporting structures: Secondary | ICD-10-CM

## 2012-11-05 DIAGNOSIS — F17200 Nicotine dependence, unspecified, uncomplicated: Secondary | ICD-10-CM | POA: Insufficient documentation

## 2012-11-05 DIAGNOSIS — Z8673 Personal history of transient ischemic attack (TIA), and cerebral infarction without residual deficits: Secondary | ICD-10-CM | POA: Insufficient documentation

## 2012-11-05 MED ORDER — TRAMADOL HCL 50 MG PO TABS: 50.0000 mg | ORAL_TABLET | Freq: Four times a day (QID) | ORAL | Status: DC | PRN

## 2012-11-05 MED ORDER — IBUPROFEN 800 MG PO TABS: 800.0000 mg | ORAL_TABLET | Freq: Three times a day (TID) | ORAL | Status: DC

## 2012-11-05 NOTE — ED Provider Notes (Signed)
Medical screening examination/treatment/procedure(s) were performed by non-physician practitioner and as supervising physician I was immediately available for consultation/collaboration.   Finley Chevez L Jhada Risk, MD 11/05/12 2326 

## 2012-11-05 NOTE — ED Provider Notes (Signed)
History     CSN: [id number]  Arrival date & time 11/05/12  1900   First MD Initiated Contact with Patient 11/05/12 1907      Chief Complaint  Patient presents with  . Dental Pain    (Consider location/radiation/quality/duration/timing/severity/associated sxs/prior treatment) HPI Comments: Gary Harrington  presents with a 1 day history of dental pain.  He has chronic dental decay which he has been unable to see a dentist due to lack of insurance.  He was seen by his pcp this am and placed on amoxil for nasal congestion and cough. Since this morning,  He has developed increased pain in his left lower lateral incisor tooth. The patient has a history of injury to the tooth involved which has recently started to cause pain.  There has been no fevers,  Chills, nausea or vomiting, also no complaint of difficulty swallowing,  Although chewing makes pain worse.  The patient has tried goody powders without relief of symptoms.      The history is provided by the patient.    Past Medical History  Diagnosis Date  . Stroke   . Hypertension     History reviewed. No pertinent past surgical history.  Family History  Problem Relation Age of Onset  . Stroke Father   . Heart failure Father     History  Substance Use Topics  . Smoking status: Current Every Day Smoker -- 0.5 packs/day    Types: Cigarettes  . Smokeless tobacco: Not on file  . Alcohol Use: No      Review of Systems  Constitutional: Negative for fever.  HENT: Positive for dental problem. Negative for sore throat, facial swelling, neck pain and neck stiffness.   Respiratory: Negative for shortness of breath.     Allergies  Codeine and Penicillins  Home Medications   Current Outpatient Rx  Name  Route  Sig  Dispense  Refill  . ALBUTEROL SULFATE HFA 108 (90 BASE) MCG/ACT IN AERS   Inhalation   Inhale 2 puffs into the lungs every 6 (six) hours as needed.         . ALPRAZOLAM 2 MG PO TABS   Oral   Take 2 mg by  mouth 4 (four) times daily.         . ASPIRIN EC 81 MG PO TBEC   Oral   Take 81 mg by mouth daily.           Marlin Canary HEADACHE PO   Oral   Take 1-2 packets by mouth 3 (three) times daily as needed. For pain         . CIPROFLOXACIN HCL 500 MG PO TABS   Oral   Take 1 tablet (500 mg total) by mouth 2 (two) times daily.   14 tablet   0     Please take with food   . CYCLOBENZAPRINE HCL 10 MG PO TABS   Oral   Take 10 mg by mouth 2 (two) times daily as needed. For spasms or pain         . HYDROCODONE-ACETAMINOPHEN 5-325 MG PO TABS      1 or 2 po q4h prn pain   20 tablet   0   . IBUPROFEN 800 MG PO TABS   Oral   Take 1 tablet (800 mg total) by mouth 3 (three) times daily.   21 tablet   0   . MELOXICAM 7.5 MG PO TABS      1 po bid  with food   12 tablet   0   . SULFAMETHOXAZOLE-TRIMETHOPRIM 800-160 MG PO TABS   Oral   Take 1 tablet by mouth 2 (two) times daily.   14 tablet   0     Please take with food   . TRAMADOL HCL 50 MG PO TABS   Oral   Take 1 tablet (50 mg total) by mouth every 6 (six) hours as needed for pain.   15 tablet   0     BP 130/90  Pulse 97  Temp 97.9 F (36.6 C)  Ht 5\' 9"  (1.753 m)  Wt 200 lb (90.719 kg)  BMI 29.53 kg/m2  SpO2 99%  Physical Exam  Constitutional: He is oriented to person, place, and time. He appears well-developed and well-nourished. No distress.  HENT:  Head: Normocephalic and atraumatic.  Right Ear: Tympanic membrane and external ear normal.  Left Ear: Tympanic membrane and external ear normal.  Mouth/Throat: Oropharynx is clear and moist and mucous membranes are normal. No oral lesions. Dental caries present. No dental abscesses.       Generalized dental decay and poor dental hygiene.  No obvious abscess present on todays exam.  He does many missing teeth,  Others are fractured to the gingival line.  Eyes: Conjunctivae normal are normal.  Neck: Normal range of motion. Neck supple.  Cardiovascular: Normal rate  and normal heart sounds.   Pulmonary/Chest: Effort normal.  Abdominal: He exhibits no distension.  Musculoskeletal: Normal range of motion.  Lymphadenopathy:    He has no cervical adenopathy.  Neurological: He is alert and oriented to person, place, and time.  Skin: Skin is warm and dry. No erythema.  Psychiatric: He has a normal mood and affect.    ED Course  Procedures (including critical care time)  Labs Reviewed - No data to display No results found.   1. Pain, dental       MDM  Pt encouraged to continue his amoxil. He was prescribed tramadol,  Ibuprofen.  Given dental referral lists.        Burgess Amor, [place] 11/05/12 [phone]

## 2012-11-05 NOTE — ED Notes (Signed)
Pt c/o L ower jaw dental pain. Was seen by PCP this am and treated for congestion and cough. Unable to get pain med from PCP.

## 2012-11-06 ENCOUNTER — Emergency Department (HOSPITAL_COMMUNITY)
Admission: EM | Admit: 2012-11-06 | Discharge: 2012-11-06 | Disposition: A | Discharge status: Home / Self Care | Payer: Self-pay | Attending: Emergency Medicine | Admitting: Emergency Medicine

## 2012-11-06 ENCOUNTER — Encounter (HOSPITAL_COMMUNITY): Payer: Self-pay | Admitting: *Deleted

## 2012-11-06 ENCOUNTER — Encounter (HOSPITAL_COMMUNITY): Payer: Self-pay

## 2012-11-06 DIAGNOSIS — F17200 Nicotine dependence, unspecified, uncomplicated: Secondary | ICD-10-CM | POA: Insufficient documentation

## 2012-11-06 DIAGNOSIS — Z79899 Other long term (current) drug therapy: Secondary | ICD-10-CM | POA: Insufficient documentation

## 2012-11-06 DIAGNOSIS — G8929 Other chronic pain: Secondary | ICD-10-CM | POA: Insufficient documentation

## 2012-11-06 DIAGNOSIS — Z8673 Personal history of transient ischemic attack (TIA), and cerebral infarction without residual deficits: Secondary | ICD-10-CM | POA: Insufficient documentation

## 2012-11-06 DIAGNOSIS — I10 Essential (primary) hypertension: Secondary | ICD-10-CM | POA: Insufficient documentation

## 2012-11-06 DIAGNOSIS — Z7982 Long term (current) use of aspirin: Secondary | ICD-10-CM | POA: Insufficient documentation

## 2012-11-06 DIAGNOSIS — K089 Disorder of teeth and supporting structures, unspecified: Secondary | ICD-10-CM | POA: Insufficient documentation

## 2012-11-06 DIAGNOSIS — K0889 Other specified disorders of teeth and supporting structures: Secondary | ICD-10-CM

## 2012-11-06 MED ORDER — HYDROCODONE-ACETAMINOPHEN 5-325 MG PO TABS: ORAL_TABLET | ORAL | Status: DC

## 2012-11-06 NOTE — ED Notes (Signed)
Pt c/o toothache x 3 days. 

## 2012-11-06 NOTE — ED Notes (Addendum)
Pt states seen at PCP 2 days ago for dental pain. Pt was given an antibiotic. Pt was seen at [facility] 2 days ago for the pain since his PCP couldn't give out pain medication. Pt was given 10 5/325mg  of vicoden and they are not working anymore. Pt states that his lower left bottom front tooth is hurting. Pt states that he is going to start coming here instead of [facility] due to our "niceness"

## 2012-11-06 NOTE — ED Notes (Signed)
Pt c/o lower left tooth pain. Pt reports he has seen his PCP and gone to [facility] but never went to his follow up appointment with the dentist. Pt reports the pain medicine that he was prescribed is not helping but when he puts [facility] powder directly on it he gets some relief.

## 2012-11-06 NOTE — ED Provider Notes (Signed)
Medical screening examination/treatment/procedure(s) were performed by non-physician practitioner and as supervising physician I was immediately available for consultation/collaboration.   Rolan Bucco, MD 11/06/12 2249

## 2012-11-06 NOTE — ED Notes (Signed)
Pt c/o tooth pain and was seen last night for the same. Pt is unhappy with prescriptions received.

## 2012-11-06 NOTE — ED Provider Notes (Signed)
History     CSN: [id number]  Arrival date & time 11/06/12  2018   First MD Initiated Contact with Patient 11/06/12 2052      Chief Complaint  Patient presents with  . Dental Pain    (Consider location/radiation/quality/duration/timing/severity/associated sxs/prior treatment) HPI Comments: Patient with peridental disease currently being treated with EES and Vicodan was referred to DDS which he has not called yet   Patient is a 51 y.o. male presenting with tooth pain. The history is provided by the patient.  Dental PainThe primary symptoms include mouth pain. Primary symptoms do not include headaches or shortness of breath. The symptoms began more than 1 month ago. The symptoms are unchanged. The symptoms occur constantly.    Past Medical History  Diagnosis Date  . Stroke   . Hypertension     History reviewed. No pertinent past surgical history.  Family History  Problem Relation Age of Onset  . Stroke Father   . Heart failure Father     History  Substance Use Topics  . Smoking status: Current Every Day Smoker -- 0.5 packs/day    Types: Cigarettes  . Smokeless tobacco: Not on file  . Alcohol Use: No      Review of Systems  Constitutional: Negative for chills and unexpected weight change.  HENT: Positive for dental problem.   Respiratory: Negative for shortness of breath.   Cardiovascular: Negative for chest pain.  Gastrointestinal: Negative for nausea and vomiting.  Skin: Negative for rash.  Neurological: Negative for dizziness and headaches.  All other systems reviewed and are negative.    Allergies  Codeine and Penicillins  Home Medications   Current Outpatient Rx  Name  Route  Sig  Dispense  Refill  . ALBUTEROL SULFATE HFA 108 (90 BASE) MCG/ACT IN AERS   Inhalation   Inhale 2 puffs into the lungs every 6 (six) hours as needed.         . ALPRAZOLAM 2 MG PO TABS   Oral   Take 2 mg by mouth 4 (four) times daily.         . ASPIRIN EC 81 MG PO  TBEC   Oral   Take 81 mg by mouth daily.           Marlin Canary HEADACHE PO   Oral   Take 1-2 packets by mouth 3 (three) times daily as needed. For pain         . CIPROFLOXACIN HCL 500 MG PO TABS   Oral   Take 1 tablet (500 mg total) by mouth 2 (two) times daily.   14 tablet   0     Please take with food   . CYCLOBENZAPRINE HCL 10 MG PO TABS   Oral   Take 10 mg by mouth 2 (two) times daily as needed. For spasms or pain         . HYDROCODONE-ACETAMINOPHEN 5-325 MG PO TABS   Oral   Take 1 tablet by mouth every 6 (six) hours as needed. For tooth pain         . SULFAMETHOXAZOLE-TRIMETHOPRIM 800-160 MG PO TABS   Oral   Take 1 tablet by mouth 2 (two) times daily.   14 tablet   0     Please take with food     BP 124/75  Temp 97.7 F (36.5 C) (Oral)  Resp 20  SpO2 97%  Physical Exam  Constitutional: He is oriented to person, place, and time. He appears well-developed and  well-nourished.  HENT:  Head: Normocephalic.       Patient only has lower front teeth all with extensive periodontal  diease and caries   Eyes: Pupils are equal, round, and reactive to light.  Neck: Normal range of motion.  Pulmonary/Chest: Effort normal.  Musculoskeletal: Normal range of motion.  Lymphadenopathy:    He has no cervical adenopathy.  Neurological: He is alert and oriented to person, place, and time.  Skin: Skin is warm.    ED Course  Procedures (including critical care time)  Labs Reviewed - No data to display No results found.   1. Chronic dental pain       MDM   Encouraged patient to continue present therapy and FU as instructed with DDS        Arman Filter, NP 11/06/12 2140  Arman Filter, NP 11/06/12 2140

## 2012-11-06 NOTE — ED Provider Notes (Signed)
History     CSN: [id number]  Arrival date & time 11/06/12  1154   First MD Initiated Contact with Patient 11/06/12 1204      Chief Complaint  Patient presents with  . Dental Pain    (Consider location/radiation/quality/duration/timing/severity/associated sxs/prior treatment) Patient is a 51 y.o. male presenting with tooth pain. The history is provided by the patient.  Dental PainThe primary symptoms include mouth pain. Primary symptoms do not include dental injury, oral bleeding, oral lesions, headaches, fever, shortness of breath, sore throat, angioedema or cough. The symptoms began 3 to 5 days ago. The symptoms are unchanged. The symptoms are new. The symptoms occur constantly.  Mouth pain began 3 - 5 days ago. Mouth pain occurs constantly. Mouth pain is worsening. Affected locations include: teeth and gum(s).  Additional symptoms include: dental sensitivity to temperature and gum tenderness. Additional symptoms do not include: gum swelling, purulent gums, trismus, jaw pain, facial swelling, trouble swallowing, pain with swallowing, ear pain and swollen glands. Medical issues include: smoking and periodontal disease.    Past Medical History  Diagnosis Date  . Stroke   . Hypertension     History reviewed. No pertinent past surgical history.  Family History  Problem Relation Age of Onset  . Stroke Father   . Heart failure Father     History  Substance Use Topics  . Smoking status: Current Every Day Smoker -- 0.5 packs/day    Types: Cigarettes  . Smokeless tobacco: Not on file  . Alcohol Use: No      Review of Systems  Constitutional: Negative for fever and appetite change.  HENT: Positive for dental problem. Negative for ear pain, congestion, sore throat, facial swelling, trouble swallowing, neck pain and neck stiffness.   Eyes: Negative for pain and visual disturbance.  Respiratory: Negative for cough and shortness of breath.   Neurological: Negative for dizziness,  facial asymmetry and headaches.  Hematological: Negative for adenopathy.  All other systems reviewed and are negative.    Allergies  Codeine and Penicillins  Home Medications   Current Outpatient Rx  Name  Route  Sig  Dispense  Refill  . ALBUTEROL SULFATE HFA 108 (90 BASE) MCG/ACT IN AERS   Inhalation   Inhale 2 puffs into the lungs every 6 (six) hours as needed.         . ALPRAZOLAM 2 MG PO TABS   Oral   Take 2 mg by mouth 4 (four) times daily.         . ASPIRIN EC 81 MG PO TBEC   Oral   Take 81 mg by mouth daily.           Marlin Canary HEADACHE PO   Oral   Take 1-2 packets by mouth 3 (three) times daily as needed. For pain         . CIPROFLOXACIN HCL 500 MG PO TABS   Oral   Take 1 tablet (500 mg total) by mouth 2 (two) times daily.   14 tablet   0     Please take with food   . CYCLOBENZAPRINE HCL 10 MG PO TABS   Oral   Take 10 mg by mouth 2 (two) times daily as needed. For spasms or pain         . HYDROCODONE-ACETAMINOPHEN 5-325 MG PO TABS      1 or 2 po q4h prn pain   20 tablet   0   . IBUPROFEN 800 MG PO TABS  Oral   Take 1 tablet (800 mg total) by mouth 3 (three) times daily.   21 tablet   0   . MELOXICAM 7.5 MG PO TABS      1 po bid with food   12 tablet   0   . SULFAMETHOXAZOLE-TRIMETHOPRIM 800-160 MG PO TABS   Oral   Take 1 tablet by mouth 2 (two) times daily.   14 tablet   0     Please take with food   . TRAMADOL HCL 50 MG PO TABS   Oral   Take 1 tablet (50 mg total) by mouth every 6 (six) hours as needed for pain.   15 tablet   0     BP 116/77  Pulse 87  Temp 98.3 F (36.8 C) (Oral)  Resp 18  Ht 5\' 9"  (1.753 m)  Wt 200 lb (90.719 kg)  BMI 29.53 kg/m2  SpO2 99%  Physical Exam  Nursing note and vitals reviewed. Constitutional: He is oriented to person, place, and time. He appears well-developed and well-nourished. No distress.  HENT:  Head: Normocephalic and atraumatic. No trismus in the jaw.  Right Ear: Tympanic  membrane and ear canal normal.  Left Ear: Tympanic membrane and ear canal normal.  Mouth/Throat: Uvula is midline, oropharynx is clear and moist and mucous membranes are normal. Dental caries present. No dental abscesses or uvula swelling.       Multiple dental caries of the lower teeth.  No obvious dental abscess, no trismus or facial edema  Neck: Normal range of motion. Neck supple.  Cardiovascular: Normal rate, regular rhythm and normal heart sounds.   No murmur heard. Pulmonary/Chest: Effort normal and breath sounds normal.  Musculoskeletal: Normal range of motion.  Lymphadenopathy:    He has no cervical adenopathy.  Neurological: He is alert and oriented to person, place, and time. He exhibits normal muscle tone. Coordination normal.  Skin: Skin is warm and dry.    ED Course  Procedures (including critical care time)  Labs Reviewed - No data to display No results found.      MDM   Pt seen here yesterday for same.  Given Rx's for ultram and ibuprofen that he has not filled.  Has both at home, not helping the pain.  Currently taking antibiotic that was prescribed by his PCP.     Multiple dental caries.  Advised him that he will need further management of his dental pain by his dentist.      Benicio Manna L. [place], [place] 11/07/12 2054

## 2012-11-07 ENCOUNTER — Emergency Department (HOSPITAL_COMMUNITY)
Admission: EM | Admit: 2012-11-07 | Discharge: 2012-11-07 | Disposition: A | Discharge status: Home Health | Payer: Self-pay | Attending: Emergency Medicine | Admitting: Emergency Medicine

## 2012-11-07 ENCOUNTER — Encounter (HOSPITAL_COMMUNITY): Payer: Self-pay | Admitting: Emergency Medicine

## 2012-11-07 DIAGNOSIS — Z8673 Personal history of transient ischemic attack (TIA), and cerebral infarction without residual deficits: Secondary | ICD-10-CM | POA: Insufficient documentation

## 2012-11-07 DIAGNOSIS — Z79899 Other long term (current) drug therapy: Secondary | ICD-10-CM | POA: Insufficient documentation

## 2012-11-07 DIAGNOSIS — K089 Disorder of teeth and supporting structures, unspecified: Secondary | ICD-10-CM | POA: Insufficient documentation

## 2012-11-07 DIAGNOSIS — Z7982 Long term (current) use of aspirin: Secondary | ICD-10-CM | POA: Insufficient documentation

## 2012-11-07 DIAGNOSIS — I10 Essential (primary) hypertension: Secondary | ICD-10-CM | POA: Insufficient documentation

## 2012-11-07 DIAGNOSIS — F17200 Nicotine dependence, unspecified, uncomplicated: Secondary | ICD-10-CM | POA: Insufficient documentation

## 2012-11-07 DIAGNOSIS — K0889 Other specified disorders of teeth and supporting structures: Secondary | ICD-10-CM

## 2012-11-07 NOTE — ED Notes (Signed)
C/o left lower tooth ache x several days, states he has an appt lined up with a dentist. Also states he was given 10 "5/325's" at [facility] and is out of them

## 2012-11-07 NOTE — ED Provider Notes (Signed)
History     CSN: [id number]  Arrival date & time 11/07/12  1900   First MD Initiated Contact with Patient 11/07/12 2007      Chief Complaint  Patient presents with  . Dental Pain    (Consider location/radiation/quality/duration/timing/severity/associated sxs/prior treatment) HPI History provided by pt and prior chart.  Pt c/o severe left lower dental pain.  Had been taking vicodin with some relief but ran out of this medication.  No associated fever.  Is currently on an antibiotic for dental infection, prescribed by his PCP.   Past Medical History  Diagnosis Date  . Stroke   . Hypertension     History reviewed. No pertinent past surgical history.  Family History  Problem Relation Age of Onset  . Stroke Father   . Heart failure Father     History  Substance Use Topics  . Smoking status: Current Every Day Smoker -- 0.5 packs/day    Types: Cigarettes  . Smokeless tobacco: Not on file  . Alcohol Use: No      Review of Systems  All other systems reviewed and are negative.    Allergies  Codeine and Penicillins  Home Medications   Current Outpatient Rx  Name  Route  Sig  Dispense  Refill  . ALBUTEROL SULFATE HFA 108 (90 BASE) MCG/ACT IN AERS   Inhalation   Inhale 2 puffs into the lungs every 6 (six) hours as needed. For shortness of breath         . ALPRAZOLAM 2 MG PO TABS   Oral   Take 2 mg by mouth 4 (four) times daily.         . ASPIRIN EC 81 MG PO TBEC   Oral   Take 81 mg by mouth daily.             BP 112/73  Pulse 87  Temp 98.3 F (36.8 C) (Oral)  Resp 18  SpO2 98%  Physical Exam  Nursing note and vitals reviewed. Constitutional: He is oriented to person, place, and time. He appears well-developed and well-nourished.  HENT:  Head: Normocephalic and atraumatic. No trismus in the jaw.  Mouth/Throat: Uvula is midline and oropharynx is clear and moist.       Left lower canine severely decayed and ttp.  Adjacent gingiva appears normal.   No edema of buccal mucosa.    Eyes:       Normal appearance  Neck: Normal range of motion. Neck supple.       No submandibular edema  Cardiovascular: Normal rate.   Pulmonary/Chest: Effort normal.  Lymphadenopathy:    He has no cervical adenopathy.  Neurological: He is alert and oriented to person, place, and time.  Psychiatric: He has a normal mood and affect. His behavior is normal.    ED Course  Procedures (including critical care time)  Labs Reviewed - No data to display No results found.   1. Pain, dental       MDM  51yo M presents w/ dental pain.  Has been seen multiple times for this problem.  Currently on an abx.  He declines local anesthesia today.  I explained why I will not send him home with narcotic pain medication.  He has ibuprofen at home.  Referred to dentist on call.          Otilio Miu, [place] 11/07/12 2020

## 2012-11-07 NOTE — ED Notes (Signed)
Pt states he was seen at [facility] 2 nights ago and here last night but was not given any pain meds. States he is pouring Goody powders into mouth for pain

## 2012-11-07 NOTE — ED Notes (Signed)
PT.REPORTS PERSISTENT LOWER TEETH PAIN / TOOTH DECAY FOR SEVERAL DAYS UNRELIEVED BY "GOODY POWDER".

## 2012-11-08 NOTE — ED Provider Notes (Signed)
Medical screening examination/treatment/procedure(s) were performed by non-physician practitioner and as supervising physician I was immediately available for consultation/collaboration.   Shelda Jakes, MD 11/08/12 [phone]

## 2012-11-09 NOTE — ED Provider Notes (Signed)
Medical screening examination/treatment/procedure(s) were performed by non-physician practitioner and as supervising physician I was immediately available for consultation/collaboration.   Hoke Baer Y. Jullie Arps, MD 11/09/12 2127 

## 2012-12-25 ENCOUNTER — Emergency Department (HOSPITAL_COMMUNITY)
Admission: EM | Admit: 2012-12-25 | Discharge: 2012-12-25 | Disposition: A | Discharge status: Home / Self Care | Payer: Self-pay | Attending: Emergency Medicine | Admitting: Emergency Medicine

## 2012-12-25 ENCOUNTER — Encounter (HOSPITAL_COMMUNITY): Payer: Self-pay | Admitting: *Deleted

## 2012-12-25 DIAGNOSIS — I10 Essential (primary) hypertension: Secondary | ICD-10-CM | POA: Insufficient documentation

## 2012-12-25 DIAGNOSIS — Z7982 Long term (current) use of aspirin: Secondary | ICD-10-CM | POA: Insufficient documentation

## 2012-12-25 DIAGNOSIS — K044 Acute apical periodontitis of pulpal origin: Secondary | ICD-10-CM | POA: Insufficient documentation

## 2012-12-25 DIAGNOSIS — Z8673 Personal history of transient ischemic attack (TIA), and cerebral infarction without residual deficits: Secondary | ICD-10-CM | POA: Insufficient documentation

## 2012-12-25 DIAGNOSIS — K047 Periapical abscess without sinus: Secondary | ICD-10-CM

## 2012-12-25 DIAGNOSIS — F17200 Nicotine dependence, unspecified, uncomplicated: Secondary | ICD-10-CM | POA: Insufficient documentation

## 2012-12-25 DIAGNOSIS — Z79899 Other long term (current) drug therapy: Secondary | ICD-10-CM | POA: Insufficient documentation

## 2012-12-25 MED ORDER — HYDROCODONE-ACETAMINOPHEN 5-325 MG PO TABS: 1.0000 | ORAL_TABLET | ORAL | Status: DC | PRN

## 2012-12-25 MED ORDER — HYDROCODONE-ACETAMINOPHEN 5-325 MG PO TABS
1.0000 | ORAL_TABLET | Freq: Once | ORAL | Status: AC
  Administered 2012-12-25: 1 via ORAL
  Filled 2012-12-25: qty 1

## 2012-12-25 NOTE — ED Notes (Signed)
Dental pain x 2 days

## 2012-12-25 NOTE — ED Notes (Signed)
Pt with pain where tooth fell out years ago x 2 days, states he needed some gauze to place where "hole" is

## 2012-12-25 NOTE — ED Provider Notes (Signed)
History     CSN: [id number]  Arrival date & time 12/25/12  1430   First MD Initiated Contact with Patient 12/25/12 1609      Chief Complaint  Patient presents with  . Dental Pain    (Consider location/radiation/quality/duration/timing/severity/associated sxs/prior treatment) HPI Comments: LAWERENCE Harrington  presents with a 2 day history of return of dental pain and gingival swelling.   The patient has a history of severe generalized dental decay which he has had intermittent infections but has not been able to afford dental care. He states he has just been approved for medicaid and is scheduled to see Dr. Retta Mac in 2 weeks to start getting his teeth pulled in anticipation of dentures.  He is taking clindamycin currently.  There has been no fevers,  Chills, nausea or vomiting, also no complaint of difficulty swallowing,  Although chewing makes pain worse.  The patient has tried goody powders without relief of symptoms.      The history is provided by the patient.    Past Medical History  Diagnosis Date  . Stroke   . Hypertension     History reviewed. No pertinent past surgical history.  Family History  Problem Relation Age of Onset  . Stroke Father   . Heart failure Father     History  Substance Use Topics  . Smoking status: Current Every Day Smoker -- 0.5 packs/day    Types: Cigarettes  . Smokeless tobacco: Not on file  . Alcohol Use: No      Review of Systems  Constitutional: Negative for fever.  HENT: Positive for dental problem. Negative for sore throat, facial swelling, neck pain and neck stiffness.   Respiratory: Negative for shortness of breath.     Allergies  Codeine and Penicillins  Home Medications   Current Outpatient Rx  Name  Route  Sig  Dispense  Refill  . ALBUTEROL SULFATE HFA 108 (90 BASE) MCG/ACT IN AERS   Inhalation   Inhale 2 puffs into the lungs every 6 (six) hours as needed. For shortness of breath         . ALPRAZOLAM 2 MG PO TABS  Oral   Take 2 mg by mouth 4 (four) times daily.         . ASPIRIN EC 81 MG PO TBEC   Oral   Take 81 mg by mouth daily.           Marland Kitchen HYDROCODONE-ACETAMINOPHEN 5-325 MG PO TABS   Oral   Take 1 tablet by mouth every 4 (four) hours as needed for pain.   20 tablet   0     BP 109/67  Pulse 79  Temp 97.5 F (36.4 C) (Oral)  Resp 16  SpO2 99%  Physical Exam  Constitutional: He is oriented to person, place, and time. He appears well-developed and well-nourished. No distress.  HENT:  Head: Normocephalic and atraumatic. No trismus in the jaw.  Right Ear: Tympanic membrane and external ear normal.  Left Ear: Tympanic membrane and external ear normal.  Mouth/Throat: Oropharynx is clear and moist and mucous membranes are normal. No oral lesions. Dental abscesses present.       Generalized poor dentition with multiple areas of decay and gingival edema.  Eyes: Conjunctivae normal are normal.  Neck: Normal range of motion. Neck supple.  Cardiovascular: Normal rate and normal heart sounds.   Pulmonary/Chest: Effort normal.  Abdominal: He exhibits no distension.  Musculoskeletal: Normal range of motion.  Lymphadenopathy:  He has no cervical adenopathy.  Neurological: He is alert and oriented to person, place, and time.  Skin: Skin is warm and dry. No erythema.  Psychiatric: He has a normal mood and affect.    ED Course  Procedures (including critical care time)  Labs Reviewed - No data to display No results found.   1. Chronic dental infection       MDM  Pt prescribed hydrocodone.  Encouraged to continue taking clindamycin (has 300 mg tabs)  Taking bid.  F/u with oral surgery as planned.        Burgess Amor, [place] 12/25/12 [phone]

## 2012-12-26 NOTE — ED Provider Notes (Signed)
Medical screening examination/treatment/procedure(s) were performed by non-physician practitioner and as supervising physician I was immediately available for consultation/collaboration.   Shelda Jakes, MD 12/26/12 [phone]

## 2013-01-15 ENCOUNTER — Encounter (HOSPITAL_COMMUNITY): Payer: Self-pay

## 2013-01-15 ENCOUNTER — Emergency Department (HOSPITAL_COMMUNITY)
Admission: EM | Admit: 2013-01-15 | Discharge: 2013-01-15 | Disposition: A | Discharge status: Home / Self Care | Payer: Self-pay | Attending: Emergency Medicine | Admitting: Emergency Medicine

## 2013-01-15 DIAGNOSIS — M2550 Pain in unspecified joint: Secondary | ICD-10-CM | POA: Insufficient documentation

## 2013-01-15 DIAGNOSIS — Z79899 Other long term (current) drug therapy: Secondary | ICD-10-CM | POA: Insufficient documentation

## 2013-01-15 DIAGNOSIS — T33829A Superficial frostbite of unspecified foot, initial encounter: Secondary | ICD-10-CM | POA: Insufficient documentation

## 2013-01-15 DIAGNOSIS — R209 Unspecified disturbances of skin sensation: Secondary | ICD-10-CM | POA: Insufficient documentation

## 2013-01-15 DIAGNOSIS — X31XXXA Exposure to excessive natural cold, initial encounter: Secondary | ICD-10-CM | POA: Insufficient documentation

## 2013-01-15 DIAGNOSIS — Z8673 Personal history of transient ischemic attack (TIA), and cerebral infarction without residual deficits: Secondary | ICD-10-CM | POA: Insufficient documentation

## 2013-01-15 DIAGNOSIS — Z7982 Long term (current) use of aspirin: Secondary | ICD-10-CM | POA: Insufficient documentation

## 2013-01-15 DIAGNOSIS — Y929 Unspecified place or not applicable: Secondary | ICD-10-CM | POA: Insufficient documentation

## 2013-01-15 DIAGNOSIS — Y939 Activity, unspecified: Secondary | ICD-10-CM | POA: Insufficient documentation

## 2013-01-15 DIAGNOSIS — F17200 Nicotine dependence, unspecified, uncomplicated: Secondary | ICD-10-CM | POA: Insufficient documentation

## 2013-01-15 DIAGNOSIS — I10 Essential (primary) hypertension: Secondary | ICD-10-CM | POA: Insufficient documentation

## 2013-01-15 DIAGNOSIS — T3390XA Superficial frostbite of unspecified sites, initial encounter: Secondary | ICD-10-CM

## 2013-01-15 MED ORDER — ONDANSETRON HCL 4 MG PO TABS
4.0000 mg | ORAL_TABLET | Freq: Once | ORAL | Status: AC
  Administered 2013-01-15: 4 mg via ORAL
  Filled 2013-01-15: qty 1

## 2013-01-15 MED ORDER — HYDROCODONE-ACETAMINOPHEN 5-325 MG PO TABS
2.0000 | ORAL_TABLET | Freq: Once | ORAL | Status: AC
  Administered 2013-01-15: 2 via ORAL
  Filled 2013-01-15: qty 2

## 2013-01-15 MED ORDER — HYDROCODONE-ACETAMINOPHEN 5-325 MG PO TABS: 1.0000 | ORAL_TABLET | ORAL | Status: DC | PRN

## 2013-01-15 MED ORDER — KETOROLAC TROMETHAMINE 60 MG/2ML IM SOLN
60.0000 mg | Freq: Once | INTRAMUSCULAR | Status: AC
  Administered 2013-01-15: 60 mg via INTRAMUSCULAR
  Filled 2013-01-15: qty 2

## 2013-01-15 NOTE — ED Notes (Signed)
Pt reports only has 2 electric heaters in his house and woke up this am feeling cold.  C/O pain and numbness in 4th and 5th toes on both feet.  Denies any pain or numbness anywhere else.

## 2013-01-15 NOTE — ED Provider Notes (Signed)
History     CSN: [id number]  Arrival date & time 01/15/13  4098   First MD Initiated Contact with Patient 01/15/13 [phone]      Chief Complaint  Patient presents with  . Toe Pain    (Consider location/radiation/quality/duration/timing/severity/associated sxs/prior treatment) Patient is a 52 y.o. male presenting with toe pain. The history is provided by the patient.  Toe Pain This is a new problem. The current episode started today. The problem occurs constantly. The problem has been gradually worsening. Associated symptoms include arthralgias and numbness. Pertinent negatives include no abdominal pain, chest pain, coughing or neck pain. Nothing aggravates the symptoms. He has tried NSAIDs for the symptoms. The treatment provided no relief.    Past Medical History  Diagnosis Date  . Stroke   . Hypertension     History reviewed. No pertinent past surgical history.  Family History  Problem Relation Age of Onset  . Stroke Father   . Heart failure Father     History  Substance Use Topics  . Smoking status: Current Every Day Smoker -- 0.5 packs/day    Types: Cigarettes  . Smokeless tobacco: Not on file  . Alcohol Use: No      Review of Systems  Constitutional: Negative for activity change.       All ROS Neg except as noted in HPI  HENT: Negative for nosebleeds and neck pain.   Eyes: Negative for photophobia and discharge.  Respiratory: Negative for cough, shortness of breath and wheezing.   Cardiovascular: Negative for chest pain and palpitations.  Gastrointestinal: Negative for abdominal pain and blood in stool.  Genitourinary: Negative for dysuria, frequency and hematuria.  Musculoskeletal: Positive for arthralgias. Negative for back pain.  Skin: Negative.   Neurological: Positive for numbness. Negative for dizziness, seizures and speech difficulty.  Psychiatric/Behavioral: Negative for hallucinations and confusion.    Allergies  Codeine and Penicillins  Home  Medications   Current Outpatient Rx  Name  Route  Sig  Dispense  Refill  . ALBUTEROL SULFATE HFA 108 (90 BASE) MCG/ACT IN AERS   Inhalation   Inhale 2 puffs into the lungs every 6 (six) hours as needed. For shortness of breath         . ALPRAZOLAM 2 MG PO TABS   Oral   Take 2 mg by mouth 4 (four) times daily.         . ASPIRIN EC 81 MG PO TBEC   Oral   Take 81 mg by mouth daily.             BP 138/76  Pulse 71  Temp 97.5 F (36.4 C) (Oral)  Resp 18  Ht 5\' 9"  (1.753 m)  Wt 198 lb (89.812 kg)  BMI 29.24 kg/m2  SpO2 100%  Physical Exam  Nursing note and vitals reviewed. Constitutional: He is oriented to person, place, and time. He appears well-developed and well-nourished.  Non-toxic appearance.  HENT:  Head: Normocephalic.  Right Ear: Tympanic membrane and external ear normal.  Left Ear: Tympanic membrane and external ear normal.  Eyes: EOM and lids are normal. Pupils are equal, round, and reactive to light.  Neck: Normal range of motion. Neck supple. Carotid bruit is not present.  Cardiovascular: Normal rate, regular rhythm, normal heart sounds, intact distal pulses and normal pulses.   Pulmonary/Chest: Breath sounds normal. No respiratory distress.  Abdominal: Soft. Bowel sounds are normal. There is no tenderness. There is no guarding.  Musculoskeletal: Normal range of motion.  There is full range of motion of the left hip and knee and left ankle. The dorsalis pedis pulses 2+ and symmetrical. The toes are cool but not cold. The capillary refill is less than 3 seconds. There is no bruising or deformity involving the toes of the left foot. There no lesions between the toes. There is no puncture type wounds to the plantar surface. The patient can flex and extend the toes. But this is uncomfortable for him.  Lymphadenopathy:       Head (right side): No submandibular adenopathy present.       Head (left side): No submandibular adenopathy present.    He has no  cervical adenopathy.  Neurological: He is alert and oriented to person, place, and time. He has normal strength. No cranial nerve deficit or sensory deficit.  Skin: Skin is warm and dry.  Psychiatric: He has a normal mood and affect. His speech is normal.    ED Course  Procedures (including critical care time)  Labs Reviewed - No data to display No results found.   No diagnosis found.    MDM  I have reviewed nursing notes, vital signs, and all appropriate lab and imaging results for this patient. Patient states he is in the house though his to the electric heaters. He woke up to find that his fourth and fifth toes on the right and left the were numb-and very cold. The feet were wrapped in a warm blanket and then additional blankets applied to the entire body. The patient was treated with intramuscular ketorolac, as well as oral Norco. The patient is now feeling significantly better he is able to ambulate in the [place] without any problem. Prescription for Norco given to the patient.       Kathie Dike, [place] 01/15/13 1039

## 2013-01-15 NOTE — ED Provider Notes (Signed)
Medical screening examination/treatment/procedure(s) were performed by non-physician practitioner and as supervising physician I was immediately available for consultation/collaboration.  Raeford Razor, MD 01/15/13 1104

## 2013-01-24 ENCOUNTER — Encounter (HOSPITAL_COMMUNITY): Payer: Self-pay | Admitting: Emergency Medicine

## 2013-01-24 ENCOUNTER — Emergency Department (HOSPITAL_COMMUNITY)
Admission: EM | Admit: 2013-01-24 | Discharge: 2013-01-24 | Disposition: A | Discharge status: Home / Self Care | Payer: Self-pay | Attending: Emergency Medicine | Admitting: Emergency Medicine

## 2013-01-24 DIAGNOSIS — K089 Disorder of teeth and supporting structures, unspecified: Secondary | ICD-10-CM | POA: Insufficient documentation

## 2013-01-24 DIAGNOSIS — Z7982 Long term (current) use of aspirin: Secondary | ICD-10-CM | POA: Insufficient documentation

## 2013-01-24 DIAGNOSIS — K029 Dental caries, unspecified: Secondary | ICD-10-CM | POA: Insufficient documentation

## 2013-01-24 DIAGNOSIS — I10 Essential (primary) hypertension: Secondary | ICD-10-CM | POA: Insufficient documentation

## 2013-01-24 DIAGNOSIS — Z8673 Personal history of transient ischemic attack (TIA), and cerebral infarction without residual deficits: Secondary | ICD-10-CM | POA: Insufficient documentation

## 2013-01-24 DIAGNOSIS — F17200 Nicotine dependence, unspecified, uncomplicated: Secondary | ICD-10-CM | POA: Insufficient documentation

## 2013-01-24 DIAGNOSIS — Z79899 Other long term (current) drug therapy: Secondary | ICD-10-CM | POA: Insufficient documentation

## 2013-01-24 DIAGNOSIS — K0889 Other specified disorders of teeth and supporting structures: Secondary | ICD-10-CM

## 2013-01-24 MED ORDER — HYDROCODONE-ACETAMINOPHEN 5-325 MG PO TABS: ORAL_TABLET | ORAL | Status: DC

## 2013-01-24 NOTE — ED Notes (Signed)
Patient c/o left lower cental pain that started last night. Per patient using Goody powder with no relief. Patient reports he has appointment with oral surgeon this month but is waiting for medicaid card.

## 2013-01-24 NOTE — ED Provider Notes (Signed)
Medical screening examination/treatment/procedure(s) were performed by non-physician practitioner and as supervising physician I was immediately available for consultation/collaboration.   Janeane Cozart L Kaileah Shevchenko, MD 01/24/13 1444 

## 2013-01-24 NOTE — ED Provider Notes (Signed)
History     CSN: [id number]  Arrival date & time 01/24/13  0740   First MD Initiated Contact with Patient 01/24/13 [phone]      Chief Complaint  Patient presents with  . Dental Pain    (Consider location/radiation/quality/duration/timing/severity/associated sxs/prior treatment) Patient is a 52 y.o. male presenting with tooth pain. The history is provided by the patient.  Dental PainThe primary symptoms include mouth pain. Primary symptoms do not include dental injury, oral bleeding, oral lesions, headaches, fever, shortness of breath, sore throat, angioedema or cough. The symptoms began yesterday. The symptoms are worsening. The symptoms are recurrent. The symptoms occur constantly.  Mouth pain occurs constantly. Affected locations include: teeth and gum(s).  Additional symptoms include: dental sensitivity to temperature and gum tenderness. Additional symptoms do not include: gum swelling, trismus, jaw pain, facial swelling, trouble swallowing, pain with swallowing, drooling, ear pain and swollen glands. Medical issues include: smoking and periodontal disease.    Past Medical History  Diagnosis Date  . Stroke   . Hypertension     History reviewed. No pertinent past surgical history.  Family History  Problem Relation Age of Onset  . Stroke Father   . Heart failure Father     History  Substance Use Topics  . Smoking status: Current Every Day Smoker -- 0.5 packs/day for 27 years    Types: Cigarettes  . Smokeless tobacco: Never Used  . Alcohol Use: No      Review of Systems  Constitutional: Negative for fever and appetite change.  HENT: Positive for dental problem. Negative for ear pain, congestion, sore throat, facial swelling, drooling, trouble swallowing, neck pain and neck stiffness.   Eyes: Negative for pain and visual disturbance.  Respiratory: Negative for cough and shortness of breath.   Neurological: Negative for dizziness, facial asymmetry and headaches.   Hematological: Negative for adenopathy.  All other systems reviewed and are negative.    Allergies  Codeine and Penicillins  Home Medications   Current Outpatient Rx  Name  Route  Sig  Dispense  Refill  . ALBUTEROL SULFATE HFA 108 (90 BASE) MCG/ACT IN AERS   Inhalation   Inhale 2 puffs into the lungs every 6 (six) hours as needed. For shortness of breath         . ALPRAZOLAM 2 MG PO TABS   Oral   Take 2 mg by mouth 4 (four) times daily.         . ASPIRIN EC 81 MG PO TBEC   Oral   Take 81 mg by mouth daily.           Marland Kitchen HYDROCODONE-ACETAMINOPHEN 5-325 MG PO TABS   Oral   Take 1 tablet by mouth every 4 (four) hours as needed for pain.   15 tablet   0     BP 120/75  Pulse 76  Temp 97.9 F (36.6 C) (Oral)  Resp 20  Ht 5\' 9"  (1.753 m)  Wt 195 lb (88.451 kg)  BMI 28.80 kg/m2  SpO2 98%  Physical Exam  Nursing note and vitals reviewed. Constitutional: He is oriented to person, place, and time. He appears well-developed and well-nourished. No distress.  HENT:  Head: Normocephalic and atraumatic. No trismus in the jaw.  Right Ear: Tympanic membrane and ear canal normal.  Left Ear: Tympanic membrane and ear canal normal.  Mouth/Throat: Uvula is midline, oropharynx is clear and moist and mucous membranes are normal. Dental caries present. No dental abscesses or uvula swelling.  Multiple dental caries and ttp of the lower lateral incisors. periodontal disease also present.  No facial edema, trismus, or dental abscess  Neck: Normal range of motion. Neck supple.  Cardiovascular: Normal rate, regular rhythm and normal heart sounds.   No murmur heard. Pulmonary/Chest: Effort normal and breath sounds normal.  Musculoskeletal: Normal range of motion.  Lymphadenopathy:    He has no cervical adenopathy.  Neurological: He is alert and oriented to person, place, and time. He exhibits normal muscle tone. Coordination normal.  Skin: Skin is warm and dry.    ED Course   Procedures (including critical care time)  Labs Reviewed - No data to display No results found.     MDM     Previous ed charts reviewed.  Patient has hx of frequent ED visits for dental pain.  Waiting for medicaid approval to arrange f/u with dentistry.  Currently taking clindamycin.   Prescribed: norco #15     Markelle Najarian L. [place], [place] 01/24/13 [phone]

## 2013-02-02 ENCOUNTER — Emergency Department (HOSPITAL_COMMUNITY)
Admission: EM | Admit: 2013-02-02 | Discharge: 2013-02-02 | Disposition: A | Discharge status: Home / Self Care | Payer: Self-pay | Attending: Emergency Medicine | Admitting: Emergency Medicine

## 2013-02-02 ENCOUNTER — Encounter (HOSPITAL_COMMUNITY): Payer: Self-pay | Admitting: *Deleted

## 2013-02-02 DIAGNOSIS — K089 Disorder of teeth and supporting structures, unspecified: Secondary | ICD-10-CM | POA: Insufficient documentation

## 2013-02-02 DIAGNOSIS — R1310 Dysphagia, unspecified: Secondary | ICD-10-CM | POA: Insufficient documentation

## 2013-02-02 DIAGNOSIS — H9209 Otalgia, unspecified ear: Secondary | ICD-10-CM | POA: Insufficient documentation

## 2013-02-02 DIAGNOSIS — R220 Localized swelling, mass and lump, head: Secondary | ICD-10-CM | POA: Insufficient documentation

## 2013-02-02 DIAGNOSIS — K0889 Other specified disorders of teeth and supporting structures: Secondary | ICD-10-CM

## 2013-02-02 DIAGNOSIS — I10 Essential (primary) hypertension: Secondary | ICD-10-CM | POA: Insufficient documentation

## 2013-02-02 DIAGNOSIS — Z7982 Long term (current) use of aspirin: Secondary | ICD-10-CM | POA: Insufficient documentation

## 2013-02-02 DIAGNOSIS — Z8673 Personal history of transient ischemic attack (TIA), and cerebral infarction without residual deficits: Secondary | ICD-10-CM | POA: Insufficient documentation

## 2013-02-02 DIAGNOSIS — Z79899 Other long term (current) drug therapy: Secondary | ICD-10-CM | POA: Insufficient documentation

## 2013-02-02 DIAGNOSIS — F17200 Nicotine dependence, unspecified, uncomplicated: Secondary | ICD-10-CM | POA: Insufficient documentation

## 2013-02-02 MED ORDER — TRAMADOL HCL 50 MG PO TABS
50.0000 mg | ORAL_TABLET | Freq: Once | ORAL | Status: AC
  Administered 2013-02-02: 50 mg via ORAL
  Filled 2013-02-02: qty 1

## 2013-02-02 MED ORDER — CLINDAMYCIN HCL 150 MG PO CAPS
300.0000 mg | ORAL_CAPSULE | Freq: Once | ORAL | Status: AC
  Administered 2013-02-02: 300 mg via ORAL
  Filled 2013-02-02: qty 2

## 2013-02-02 NOTE — ED Notes (Signed)
Dental pain. 

## 2013-02-02 NOTE — ED Provider Notes (Signed)
History     CSN: [id number]  Arrival date & time 02/02/13  1478   First MD Initiated Contact with Patient 02/02/13 1936      Chief Complaint  Patient presents with  . Dental Pain    (Consider location/radiation/quality/duration/timing/severity/associated sxs/prior treatment) Patient is a 52 y.o. male presenting with tooth pain. The history is provided by the patient.  Dental PainThe primary symptoms include mouth pain. Primary symptoms do not include dental injury, oral bleeding, oral lesions, headaches, fever, shortness of breath, sore throat, angioedema or cough. The symptoms began 6 to 12 hours ago. The symptoms are worsening. The symptoms are chronic. The symptoms occur constantly.  Mouth pain began 6 - 12 hours ago. Mouth pain occurs constantly. Mouth pain is worsening. Affected locations include: teeth and gum(s).  Additional symptoms include: dental sensitivity to temperature and gum tenderness. Additional symptoms do not include: gum swelling, purulent gums, trismus, facial swelling, trouble swallowing, pain with swallowing, ear pain and swollen glands. Medical issues include: smoking and periodontal disease.    Past Medical History  Diagnosis Date  . Stroke   . Hypertension     History reviewed. No pertinent past surgical history.  Family History  Problem Relation Age of Onset  . Stroke Father   . Heart failure Father     History  Substance Use Topics  . Smoking status: Current Every Day Smoker -- 0.50 packs/day for 27 years    Types: Cigarettes  . Smokeless tobacco: Never Used  . Alcohol Use: No      Review of Systems  Constitutional: Negative for fever and appetite change.  HENT: Positive for dental problem. Negative for ear pain, congestion, sore throat, facial swelling, trouble swallowing, neck pain and neck stiffness.   Eyes: Negative for pain and visual disturbance.  Respiratory: Negative for cough and shortness of breath.   Neurological: Negative for  dizziness, facial asymmetry and headaches.  Hematological: Negative for adenopathy.  All other systems reviewed and are negative.    Allergies  Codeine and Penicillins  Home Medications   Current Outpatient Rx  Name  Route  Sig  Dispense  Refill  . albuterol (PROVENTIL HFA;VENTOLIN HFA) 108 (90 BASE) MCG/ACT inhaler   Inhalation   Inhale 2 puffs into the lungs every 6 (six) hours as needed. For shortness of breath         . alprazolam (XANAX) 2 MG tablet   Oral   Take 2 mg by mouth 4 (four) times daily.         Marland Kitchen aspirin EC 81 MG tablet   Oral   Take 81 mg by mouth daily.           Marland Kitchen HYDROcodone-acetaminophen (NORCO/VICODIN) 5-325 MG per tablet   Oral   Take 1 tablet by mouth every 4 (four) hours as needed for pain.   15 tablet   0   . HYDROcodone-acetaminophen (NORCO/VICODIN) 5-325 MG per tablet      Take one-two tabs po q 4-6 hrs prn pain   15 tablet   0     Ht 5\' 9"  (1.753 m)  Wt 200 lb (90.719 kg)  BMI 29.52 kg/m2  Physical Exam  Nursing note and vitals reviewed. Constitutional: He is oriented to person, place, and time. He appears well-developed and well-nourished. No distress.  HENT:  Head: Normocephalic and atraumatic. No trismus in the jaw.  Right Ear: Tympanic membrane and ear canal normal.  Left Ear: Tympanic membrane and ear canal normal.  Mouth/Throat:  Uvula is midline, oropharynx is clear and moist and mucous membranes are normal. Dental caries present. No dental abscesses or edematous.    Widespread dental decay and periodontal disease.  No facial edema, dental abscess or trismus  Neck: Normal range of motion. Neck supple.  Cardiovascular: Normal rate, regular rhythm, normal heart sounds and intact distal pulses.   No murmur heard. Pulmonary/Chest: Effort normal and breath sounds normal.  Musculoskeletal: Normal range of motion. He exhibits no edema.  Lymphadenopathy:    He has no cervical adenopathy.  Neurological: He is alert and  oriented to person, place, and time. He exhibits normal muscle tone. Coordination normal.  Skin: Skin is warm and dry.    ED Course  Procedures (including critical care time)  Labs Reviewed - No data to display No results found.      MDM    Previous Ed visits reviewed by me.  Pt has been seen here multiple times for dental pain.  Patient is well known by me and appears to be at his baseline.  No facial edema, trismus, or dental abscess.  Pt states he is currently taking clindamycin  I have advised him that he has multiple ED visits for dental pain and that he will need further management of his dental issues by his dentist. Advised that he will not be prescribed narcotics on this visit.          Eliu Batch L. [place], [place] 02/02/13 1952

## 2013-02-02 NOTE — ED Provider Notes (Signed)
Medical screening examination/treatment/procedure(s) were performed by non-physician practitioner and as supervising physician I was immediately available for consultation/collaboration. Devoria Albe, MD, [facility] Gang   Ward Givens, MD 02/02/13 [phone]

## 2013-03-12 ENCOUNTER — Emergency Department (HOSPITAL_COMMUNITY)
Admission: EM | Admit: 2013-03-12 | Discharge: 2013-03-12 | Disposition: A | Discharge status: Home / Self Care | Payer: Self-pay

## 2013-03-12 NOTE — ED Notes (Signed)
Pt trying to get a refill for xanax that he lost. Gary Harrington states she would be glad to see him but was not writing him a prescription. Pt decided to leave.

## 2013-03-24 ENCOUNTER — Encounter (HOSPITAL_COMMUNITY): Payer: Self-pay | Admitting: *Deleted

## 2013-03-24 ENCOUNTER — Emergency Department (HOSPITAL_COMMUNITY): Payer: Self-pay

## 2013-03-24 ENCOUNTER — Emergency Department (HOSPITAL_COMMUNITY)
Admission: EM | Admit: 2013-03-24 | Discharge: 2013-03-25 | Disposition: A | Discharge status: Home / Self Care | Payer: Self-pay | Attending: Emergency Medicine | Admitting: Emergency Medicine

## 2013-03-24 DIAGNOSIS — I10 Essential (primary) hypertension: Secondary | ICD-10-CM | POA: Insufficient documentation

## 2013-03-24 DIAGNOSIS — Z8673 Personal history of transient ischemic attack (TIA), and cerebral infarction without residual deficits: Secondary | ICD-10-CM | POA: Insufficient documentation

## 2013-03-24 DIAGNOSIS — Z79899 Other long term (current) drug therapy: Secondary | ICD-10-CM | POA: Insufficient documentation

## 2013-03-24 DIAGNOSIS — R42 Dizziness and giddiness: Secondary | ICD-10-CM | POA: Insufficient documentation

## 2013-03-24 DIAGNOSIS — R51 Headache: Secondary | ICD-10-CM | POA: Insufficient documentation

## 2013-03-24 DIAGNOSIS — H53149 Visual discomfort, unspecified: Secondary | ICD-10-CM | POA: Insufficient documentation

## 2013-03-24 DIAGNOSIS — Z7982 Long term (current) use of aspirin: Secondary | ICD-10-CM | POA: Insufficient documentation

## 2013-03-24 DIAGNOSIS — F430 Acute stress reaction: Secondary | ICD-10-CM | POA: Insufficient documentation

## 2013-03-24 DIAGNOSIS — F17200 Nicotine dependence, unspecified, uncomplicated: Secondary | ICD-10-CM | POA: Insufficient documentation

## 2013-03-24 NOTE — ED Notes (Signed)
Pt reports ha to the left side of his head. Reports blurred vision to the left eye. Pt has taken 7 goodys powders. Denies any numbness or weakness. No N/V

## 2013-03-24 NOTE — ED Notes (Signed)
Headache, dizzy, for 3 days,  Took goody powders without relief.  No injury

## 2013-03-25 MED ORDER — HYDROCODONE-ACETAMINOPHEN 5-325 MG PO TABS: ORAL_TABLET | ORAL | Status: DC

## 2013-03-25 MED ORDER — HYDROCODONE-ACETAMINOPHEN 5-325 MG PO TABS
1.0000 | ORAL_TABLET | Freq: Once | ORAL | Status: AC
  Administered 2013-03-25: 1 via ORAL
  Filled 2013-03-25: qty 1

## 2013-03-25 NOTE — ED Notes (Signed)
Pt alert & oriented x4, stable gait. Patient given discharge instructions, paperwork & prescription(s). Patient  instructed to stop at the registration desk to finish any additional paperwork. Patient verbalized understanding. Pt left department w/ no further questions. 

## 2013-03-25 NOTE — ED Notes (Addendum)
Pt able to ambulated around nurses station w/ no complications.

## 2013-03-25 NOTE — ED Provider Notes (Signed)
History     CSN: [id number]  Arrival date & time 03/24/13  2034   First MD Initiated Contact with Patient 03/24/13 2256      Chief Complaint  Patient presents with  . Headache    (Consider location/radiation/quality/duration/timing/severity/associated sxs/prior treatment) HPI Comments: Patient c/o gradually worsening left sided headache that began 3 days ago.  States the pain has been worse today.  He has been taking "Goody Powders" today w/o relief.  He also reports intermittent dizziness with sudden position change and increased stress and not sleeping well recently.  He denies chest pain, vomiting, neck pain or stiffness, fever, numbness or weakness to the extremities or difficulty speaking or swallowing.    Patient is a 52 y.o. male presenting with headaches. The history is provided by the patient.  Headache Pain location:  Frontal Quality:  Dull Radiates to:  Does not radiate Onset quality:  Gradual Duration:  3 days Timing:  Constant Progression:  Worsening Chronicity:  Recurrent Similar to prior headaches: yes   Context: emotional stress   Context: not activity, not exposure to bright light, not coughing and not eating   Relieved by:  Nothing Worsened by:  Nothing tried Ineffective treatments:  NSAIDs and aspirin Associated symptoms: dizziness and photophobia   Associated symptoms: no abdominal pain, no blurred vision, no congestion, no cough, no pain, no facial pain, no fever, no focal weakness, no hearing loss, no loss of balance, no myalgias, no nausea, no near-syncope, no neck pain, no neck stiffness, no numbness, no paresthesias, no seizures, no sinus pressure, no sore throat, no syncope, no tingling, no vomiting and no weakness     Past Medical History  Diagnosis Date  . Stroke   . Hypertension     History reviewed. No pertinent past surgical history.  Family History  Problem Relation Age of Onset  . Stroke Father   . Heart failure Father     History   Substance Use Topics  . Smoking status: Current Every Day Smoker -- 0.50 packs/day for 27 years    Types: Cigarettes  . Smokeless tobacco: Never Used  . Alcohol Use: No      Review of Systems  Constitutional: Negative for fever, activity change and appetite change.  HENT: Negative for hearing loss, congestion, sore throat, facial swelling, trouble swallowing, neck pain, neck stiffness and sinus pressure.   Eyes: Positive for photophobia. Negative for blurred vision, pain and visual disturbance.  Respiratory: Negative for cough, chest tightness and shortness of breath.   Cardiovascular: Negative for chest pain, syncope and near-syncope.  Gastrointestinal: Negative for nausea, vomiting and abdominal pain.  Musculoskeletal: Negative for myalgias and arthralgias.  Skin: Negative for rash and wound.  Neurological: Positive for dizziness and headaches. Negative for focal weakness, seizures, syncope, facial asymmetry, speech difficulty, weakness, numbness, paresthesias and loss of balance.  Psychiatric/Behavioral: Negative for confusion and decreased concentration. The patient is not nervous/anxious.   All other systems reviewed and are negative.    Allergies  Codeine and Penicillins  Home Medications   Current Outpatient Rx  Name  Route  Sig  Dispense  Refill  . albuterol (PROVENTIL HFA;VENTOLIN HFA) 108 (90 BASE) MCG/ACT inhaler   Inhalation   Inhale 2 puffs into the lungs every 6 (six) hours as needed. For shortness of breath         . alprazolam (XANAX) 2 MG tablet   Oral   Take 2 mg by mouth 4 (four) times daily.         Marland Kitchen  aspirin EC 81 MG tablet   Oral   Take 81 mg by mouth daily.           Marland Kitchen HYDROcodone-acetaminophen (NORCO/VICODIN) 5-325 MG per tablet   Oral   Take 1 tablet by mouth every 4 (four) hours as needed for pain.   15 tablet   0   . HYDROcodone-acetaminophen (NORCO/VICODIN) 5-325 MG per tablet      Take one-two tabs po q 4-6 hrs prn pain   15  tablet   0     BP 124/79  Pulse 87  Temp(Src) 97.5 F (36.4 C) (Oral)  Resp 18  Ht 5\' 9"  (1.753 m)  Wt 200 lb (90.719 kg)  BMI 29.52 kg/m2  SpO2 99%  Physical Exam  Nursing note and vitals reviewed. Constitutional: He is oriented to person, place, and time. He appears well-developed and well-nourished. No distress.  HENT:  Head: Normocephalic and atraumatic.  Mouth/Throat: Oropharynx is clear and moist.  Eyes: Conjunctivae and EOM are normal. Pupils are equal, round, and reactive to light.  Neck: Normal range of motion and phonation normal. Neck supple. No rigidity. No Brudzinski's sign and no Kernig's sign noted.  Cardiovascular: Normal rate, regular rhythm, normal heart sounds and intact distal pulses.   No murmur heard. Pulmonary/Chest: Effort normal and breath sounds normal. No respiratory distress. He exhibits no tenderness.  Abdominal: Soft. He exhibits no distension. There is no tenderness.  Musculoskeletal: Normal range of motion.  Neurological: He is alert and oriented to person, place, and time. No cranial nerve deficit or sensory deficit. He exhibits normal muscle tone. Coordination and gait normal. GCS eye subscore is 4. GCS verbal subscore is 5. GCS motor subscore is 6.  Reflex Scores:      Tricep reflexes are 2+ on the right side and 2+ on the left side.      Bicep reflexes are 2+ on the right side and 2+ on the left side.      Brachioradialis reflexes are 2+ on the right side and 2+ on the left side.      Patellar reflexes are 2+ on the right side and 2+ on the left side.      Achilles reflexes are 2+ on the right side and 2+ on the left side. Skin: Skin is warm and dry.  Psychiatric: He has a normal mood and affect. His behavior is normal. Thought content normal.    ED Course  Procedures (including critical care time)  Labs Reviewed - No data to display Ct Head Wo Contrast  03/25/2013  *RADIOLOGY REPORT*  Clinical Data: Left sided headache and dizziness.   Blurred vision in the left eye for 3 days.  CT HEAD WITHOUT CONTRAST  Technique:  Contiguous axial images were obtained from the base of the skull through the vertex without contrast.  Comparison: 11/30/2011  Findings: The ventricles and sulci are symmetrical without significant effacement, displacement, or dilatation. No mass effect or midline shift. No abnormal extra-axial fluid collections. The grey-white matter junction is distinct. Basal cisterns are not effaced. No acute intracranial hemorrhage. No depressed skull fractures.  Small retention cyst in the right maxillary antrum.  No acute air-fluid levels in the paranasal sinuses.  Mastoid air cells are not opacified.  No significant changes since the previous study.  IMPRESSION: No acute intracranial abnormalities.   Original Report Authenticated By: Burman Nieves, M.D.         MDM    Vitals stable,  Pt is non-toxic appearing.  No focal neuro deficits, no meningeal signs.  Headache of gradual onset 3 days ago that is similar to previous. Has ambulated in the dept w/o difficulty.  No dysarthria.  Patient agrees to close f/u with PMD or to return here if the symptoms worsen.    Patient given po vicodin with improvement of pain.  Discussed his CT results with him and he agrees to contact his PMD to arrange a f/u. Doubt emergent neurological or infectious process  The patient appears reasonably screened and/or stabilized for discharge and I doubt any other medical condition or other [facility] requiring further screening, evaluation, or treatment in the ED at this time prior to discharge.    Vallerie Hentz L. Trisha Mangle, PA-C 03/25/13 0133

## 2013-03-25 NOTE — ED Provider Notes (Signed)
Medical screening examination/treatment/procedure(s) were performed by non-physician practitioner and as supervising physician I was immediately available for consultation/collaboration.  Nicoletta Dress. Colon Branch, MD 03/25/13 [phone]

## 2013-04-23 ENCOUNTER — Emergency Department (HOSPITAL_COMMUNITY)
Admission: EM | Admit: 2013-04-23 | Discharge: 2013-04-23 | Disposition: A | Discharge status: Home / Self Care | Payer: Self-pay | Attending: Emergency Medicine | Admitting: Emergency Medicine

## 2013-04-23 ENCOUNTER — Encounter (HOSPITAL_COMMUNITY): Payer: Self-pay | Admitting: Emergency Medicine

## 2013-04-23 DIAGNOSIS — Z8673 Personal history of transient ischemic attack (TIA), and cerebral infarction without residual deficits: Secondary | ICD-10-CM | POA: Insufficient documentation

## 2013-04-23 DIAGNOSIS — Z79899 Other long term (current) drug therapy: Secondary | ICD-10-CM | POA: Insufficient documentation

## 2013-04-23 DIAGNOSIS — K089 Disorder of teeth and supporting structures, unspecified: Secondary | ICD-10-CM | POA: Insufficient documentation

## 2013-04-23 DIAGNOSIS — Z880 Allergy status to penicillin: Secondary | ICD-10-CM | POA: Insufficient documentation

## 2013-04-23 DIAGNOSIS — K0889 Other specified disorders of teeth and supporting structures: Secondary | ICD-10-CM

## 2013-04-23 DIAGNOSIS — K029 Dental caries, unspecified: Secondary | ICD-10-CM | POA: Insufficient documentation

## 2013-04-23 DIAGNOSIS — I10 Essential (primary) hypertension: Secondary | ICD-10-CM | POA: Insufficient documentation

## 2013-04-23 DIAGNOSIS — F17200 Nicotine dependence, unspecified, uncomplicated: Secondary | ICD-10-CM | POA: Insufficient documentation

## 2013-04-23 DIAGNOSIS — Z7982 Long term (current) use of aspirin: Secondary | ICD-10-CM | POA: Insufficient documentation

## 2013-04-23 MED ORDER — HYDROCODONE-ACETAMINOPHEN 5-325 MG PO TABS: ORAL_TABLET | ORAL | Status: DC

## 2013-04-23 MED ORDER — CLINDAMYCIN HCL 150 MG PO CAPS: 150.0000 mg | ORAL_CAPSULE | Freq: Four times a day (QID) | ORAL | Status: DC

## 2013-04-23 NOTE — ED Notes (Signed)
Pt c/o dental pain x2 days. Pt states "I need to get all my teeth taken out".

## 2013-04-23 NOTE — ED Provider Notes (Signed)
History     CSN: [id number]  Arrival date & time 04/23/13  1908   First MD Initiated Contact with Patient 04/23/13 1914      Chief Complaint  Patient presents with  . Dental Pain    (Consider location/radiation/quality/duration/timing/severity/associated sxs/prior treatment) Patient is a 52 y.o. male presenting with tooth pain. The history is provided by the patient.  Dental PainThe primary symptoms include mouth pain. Primary symptoms do not include dental injury, oral bleeding, oral lesions, headaches, fever, shortness of breath, sore throat, angioedema or cough. The symptoms began 2 days ago. The symptoms are worsening. The symptoms are recurrent. The symptoms occur constantly.  Mouth pain occurs constantly. Mouth pain is worsening. Affected locations include: teeth.  Additional symptoms include: dental sensitivity to temperature. Additional symptoms do not include: gum swelling, gum tenderness, purulent gums, trismus, jaw pain, facial swelling, trouble swallowing, pain with swallowing, ear pain and swollen glands. Medical issues include: smoking and periodontal disease.    Past Medical History  Diagnosis Date  . Stroke   . Hypertension     History reviewed. No pertinent past surgical history.  Family History  Problem Relation Age of Onset  . Stroke Father   . Heart failure Father     History  Substance Use Topics  . Smoking status: Current Every Day Smoker -- 0.50 packs/day for 27 years    Types: Cigarettes  . Smokeless tobacco: Never Used  . Alcohol Use: No      Review of Systems  Constitutional: Negative for fever and appetite change.  HENT: Positive for dental problem. Negative for ear pain, congestion, sore throat, facial swelling, mouth sores, trouble swallowing, neck pain and neck stiffness.   Eyes: Negative for pain and visual disturbance.  Respiratory: Negative for cough and shortness of breath.   Gastrointestinal: Negative for nausea and vomiting.    Neurological: Negative for dizziness, facial asymmetry and headaches.  Hematological: Negative for adenopathy.  All other systems reviewed and are negative.    Allergies  Codeine and Penicillins  Home Medications   Current Outpatient Rx  Name  Route  Sig  Dispense  Refill  . albuterol (PROVENTIL HFA;VENTOLIN HFA) 108 (90 BASE) MCG/ACT inhaler   Inhalation   Inhale 2 puffs into the lungs every 6 (six) hours as needed. For shortness of breath         . alprazolam (XANAX) 2 MG tablet   Oral   Take 2 mg by mouth 4 (four) times daily.         Marland Kitchen aspirin EC 81 MG tablet   Oral   Take 81 mg by mouth daily.           Marland Kitchen HYDROcodone-acetaminophen (NORCO/VICODIN) 5-325 MG per tablet   Oral   Take 1 tablet by mouth every 4 (four) hours as needed for pain.   15 tablet   0   . HYDROcodone-acetaminophen (NORCO/VICODIN) 5-325 MG per tablet      Take one-two tabs po q 4-6 hrs prn pain   15 tablet   0   . HYDROcodone-acetaminophen (NORCO/VICODIN) 5-325 MG per tablet      Take one-two tabs po q 4-6 hrs prn pain   5 tablet   0     BP 117/72  Pulse 74  Temp(Src) 97.6 F (36.4 C) (Oral)  Resp 18  SpO2 100%  Physical Exam  Nursing note and vitals reviewed. Constitutional: He is oriented to person, place, and time. He appears well-developed and well-nourished. No distress.  HENT:  Head: Normocephalic and atraumatic. No trismus in the jaw.  Right Ear: Tympanic membrane and ear canal normal.  Left Ear: Tympanic membrane and ear canal normal.  Mouth/Throat: Uvula is midline, oropharynx is clear and moist and mucous membranes are normal. Dental caries present. No dental abscesses or edematous.    Multiple dental caries and widespread dental disease.  No obvious dental abscess.  No facial edema, trismus or sublingual abnml  Neck: Normal range of motion. Neck supple.  Cardiovascular: Normal rate, regular rhythm, normal heart sounds and intact distal pulses.   No murmur  heard. Pulmonary/Chest: Effort normal and breath sounds normal. No respiratory distress.  Musculoskeletal: Normal range of motion.  Lymphadenopathy:    He has no cervical adenopathy.  Neurological: He is alert and oriented to person, place, and time. He exhibits normal muscle tone. Coordination normal.  Skin: Skin is warm and dry.    ED Course  Procedures (including critical care time)  Labs Reviewed - No data to display No results found.      MDM    Multiple dental caries and black discoloration to the remaining teeth. ttp of the lower central and lateral incisors.  No erythema or edema of the gums.  No facial edema.  Patient is well known to me and has multiple ED visits for same.  He is otherwise well appearing and stable for discharge   Will prescribe clindamycin and norco #15 for pain.  Pt given info for upcoming dental clinic.  Agrees to f/u with a dentist.      Sejal Cofield L. Trisha Mangle, PA-C 04/23/13 1935

## 2013-04-24 NOTE — ED Provider Notes (Signed)
Medical screening examination/treatment/procedure(s) were performed by non-physician practitioner and as supervising physician I was immediately available for consultation/collaboration.  Dalaysia Harms, MD 04/24/13 1630 

## 2013-05-07 ENCOUNTER — Encounter (HOSPITAL_COMMUNITY): Payer: Self-pay

## 2013-05-07 DIAGNOSIS — Y939 Activity, unspecified: Secondary | ICD-10-CM | POA: Insufficient documentation

## 2013-05-07 DIAGNOSIS — Z880 Allergy status to penicillin: Secondary | ICD-10-CM | POA: Insufficient documentation

## 2013-05-07 DIAGNOSIS — Y929 Unspecified place or not applicable: Secondary | ICD-10-CM | POA: Insufficient documentation

## 2013-05-07 DIAGNOSIS — I10 Essential (primary) hypertension: Secondary | ICD-10-CM | POA: Insufficient documentation

## 2013-05-07 DIAGNOSIS — S91009A Unspecified open wound, unspecified ankle, initial encounter: Secondary | ICD-10-CM | POA: Insufficient documentation

## 2013-05-07 DIAGNOSIS — Z8673 Personal history of transient ischemic attack (TIA), and cerebral infarction without residual deficits: Secondary | ICD-10-CM | POA: Insufficient documentation

## 2013-05-07 DIAGNOSIS — F17200 Nicotine dependence, unspecified, uncomplicated: Secondary | ICD-10-CM | POA: Insufficient documentation

## 2013-05-07 DIAGNOSIS — Z79899 Other long term (current) drug therapy: Secondary | ICD-10-CM | POA: Insufficient documentation

## 2013-05-07 DIAGNOSIS — Z7982 Long term (current) use of aspirin: Secondary | ICD-10-CM | POA: Insufficient documentation

## 2013-05-07 DIAGNOSIS — S81009A Unspecified open wound, unspecified knee, initial encounter: Secondary | ICD-10-CM | POA: Insufficient documentation

## 2013-05-07 NOTE — ED Notes (Signed)
Pt states he pulled a tick off from between his toes on right foot and states pain is now severe.

## 2013-05-08 ENCOUNTER — Emergency Department (HOSPITAL_COMMUNITY)
Admission: EM | Admit: 2013-05-08 | Discharge: 2013-05-08 | Disposition: A | Discharge status: Home / Self Care | Payer: Self-pay | Attending: Emergency Medicine | Admitting: Emergency Medicine

## 2013-05-08 DIAGNOSIS — W57XXXA Bitten or stung by nonvenomous insect and other nonvenomous arthropods, initial encounter: Secondary | ICD-10-CM

## 2013-05-08 DIAGNOSIS — S91301A Unspecified open wound, right foot, initial encounter: Secondary | ICD-10-CM

## 2013-05-08 MED ORDER — DOXYCYCLINE HYCLATE 100 MG PO CAPS: 100.0000 mg | ORAL_CAPSULE | Freq: Two times a day (BID) | ORAL | Status: DC

## 2013-05-08 MED ORDER — HYDROCODONE-ACETAMINOPHEN 5-325 MG PO TABS
1.0000 | ORAL_TABLET | Freq: Once | ORAL | Status: AC
  Administered 2013-05-08: 1 via ORAL
  Filled 2013-05-08: qty 1

## 2013-05-08 MED ORDER — DOXYCYCLINE HYCLATE 100 MG PO TABS
100.0000 mg | ORAL_TABLET | Freq: Once | ORAL | Status: AC
  Administered 2013-05-08: 100 mg via ORAL
  Filled 2013-05-08: qty 1

## 2013-05-08 NOTE — ED Provider Notes (Signed)
History     CSN: [id number]  Arrival date & time 05/07/13  2343   First MD Initiated Contact with Patient 05/08/13 0202      Chief Complaint  Patient presents with  . Foot Pain    (Consider location/radiation/quality/duration/timing/severity/associated sxs/prior treatment) HPI HPI Comments: Gary Harrington is a 52 y.o. male who presents to the Emergency Department complaining of tick that he removed between his 4th and 5th toes on the right foot. He now has a sore there. No fever, chills, nausea, vomiting.  PCP Dr. Lacie Scotts  Past Medical History  Diagnosis Date  . Stroke   . Hypertension     History reviewed. No pertinent past surgical history.  Family History  Problem Relation Age of Onset  . Stroke Father   . Heart failure Father     History  Substance Use Topics  . Smoking status: Current Every Day Smoker -- 0.50 packs/day for 27 years    Types: Cigarettes  . Smokeless tobacco: Never Used  . Alcohol Use: No      Review of Systems  Constitutional: Negative for fever.       10 Systems reviewed and are negative for acute change except as noted in the HPI.  HENT: Negative for congestion.   Eyes: Negative for discharge and redness.  Respiratory: Negative for cough and shortness of breath.   Cardiovascular: Negative for chest pain.  Gastrointestinal: Negative for vomiting and abdominal pain.  Musculoskeletal: Negative for back pain.  Skin: Negative for rash.       Sore between toes  Neurological: Negative for syncope, numbness and headaches.  Psychiatric/Behavioral:       No behavior change.    Allergies  Codeine and Penicillins  Home Medications   Current Outpatient Rx  Name  Route  Sig  Dispense  Refill  . albuterol (PROVENTIL HFA;VENTOLIN HFA) 108 (90 BASE) MCG/ACT inhaler   Inhalation   Inhale 2 puffs into the lungs every 6 (six) hours as needed. For shortness of breath         . alprazolam (XANAX) 2 MG tablet   Oral   Take 2 mg by mouth 4  (four) times daily.         Marland Kitchen aspirin EC 81 MG tablet   Oral   Take 81 mg by mouth daily.           . clindamycin (CLEOCIN) 150 MG capsule   Oral   Take 1 capsule (150 mg total) by mouth 4 (four) times daily.   28 capsule   0   . cyclobenzaprine (FLEXERIL) 10 MG tablet   Oral   Take 10 mg by mouth 3 (three) times daily as needed for muscle spasms.         Marland Kitchen HYDROcodone-acetaminophen (NORCO/VICODIN) 5-325 MG per tablet      Take one-two tabs po q 4-6 hrs prn pain   15 tablet   0     BP 107/71  Pulse 68  Temp(Src) 97 F (36.1 C) (Oral)  Resp 18  SpO2 97%  Physical Exam  Nursing note and vitals reviewed. Constitutional: He appears well-developed and well-nourished.  Awake, alert, nontoxic appearance.  HENT:  Head: Normocephalic and atraumatic.  Eyes: EOM are normal. Pupils are equal, round, and reactive to light.  Neck: Neck supple.  Cardiovascular: Normal rate.   Pulmonary/Chest: Effort normal. He exhibits no tenderness.  Abdominal: Soft. There is no tenderness. There is no rebound.  Musculoskeletal: He exhibits no tenderness.  Baseline ROM, no obvious new focal weakness.  Neurological:  Mental status and motor strength appears baseline for patient and situation.  Skin: No rash noted.  Macerated area between 4th and 5th toe. No infection present.  Psychiatric: He has a normal mood and affect.    ED Course  Procedures (including critical care time)  Medications  HYDROcodone-acetaminophen (NORCO/VICODIN) 5-325 MG per tablet 1 tablet (not administered)  doxycycline (VIBRA-TABS) tablet 100 mg (not administered)     MDM  Patient with tick removal between 4th and 5th toes. Has a macerated area that is not currently infected. Will treat with doxycycline. Pt stable in ED with no significant deterioration in condition.The patient appears reasonably screened and/or stabilized for discharge and I doubt any other medical condition or other [facility] requiring further  screening, evaluation, or treatment in the ED at this time prior to discharge.  MDM Reviewed: nursing note and vitals           Nicoletta Dress. Colon Branch, MD 05/08/13 [phone]

## 2013-05-08 NOTE — ED Notes (Signed)
Pt alert & oriented x4, stable gait. Patient given discharge instructions, paperwork & prescription(s). Patient  instructed to stop at the registration desk to finish any additional paperwork. Patient verbalized understanding. Pt left department w/ no further questions. 

## 2013-07-01 ENCOUNTER — Emergency Department (HOSPITAL_COMMUNITY)
Admission: EM | Admit: 2013-07-01 | Discharge: 2013-07-01 | Disposition: A | Discharge status: Home / Self Care | Payer: Self-pay | Attending: Emergency Medicine | Admitting: Emergency Medicine

## 2013-07-01 ENCOUNTER — Encounter (HOSPITAL_COMMUNITY): Payer: Self-pay | Admitting: Emergency Medicine

## 2013-07-01 DIAGNOSIS — Z880 Allergy status to penicillin: Secondary | ICD-10-CM | POA: Insufficient documentation

## 2013-07-01 DIAGNOSIS — K089 Disorder of teeth and supporting structures, unspecified: Secondary | ICD-10-CM | POA: Insufficient documentation

## 2013-07-01 DIAGNOSIS — I10 Essential (primary) hypertension: Secondary | ICD-10-CM | POA: Insufficient documentation

## 2013-07-01 DIAGNOSIS — Z7982 Long term (current) use of aspirin: Secondary | ICD-10-CM | POA: Insufficient documentation

## 2013-07-01 DIAGNOSIS — G8929 Other chronic pain: Secondary | ICD-10-CM | POA: Insufficient documentation

## 2013-07-01 DIAGNOSIS — Z8673 Personal history of transient ischemic attack (TIA), and cerebral infarction without residual deficits: Secondary | ICD-10-CM | POA: Insufficient documentation

## 2013-07-01 DIAGNOSIS — Z792 Long term (current) use of antibiotics: Secondary | ICD-10-CM | POA: Insufficient documentation

## 2013-07-01 DIAGNOSIS — Z79899 Other long term (current) drug therapy: Secondary | ICD-10-CM | POA: Insufficient documentation

## 2013-07-01 DIAGNOSIS — K029 Dental caries, unspecified: Secondary | ICD-10-CM | POA: Insufficient documentation

## 2013-07-01 DIAGNOSIS — F17200 Nicotine dependence, unspecified, uncomplicated: Secondary | ICD-10-CM | POA: Insufficient documentation

## 2013-07-01 MED ORDER — ALUM & MAG HYDROXIDE-SIMETH 200-200-20 MG/5ML PO SUSP
30.0000 mL | Freq: Once | ORAL | Status: AC
  Administered 2013-07-01: 30 mL via ORAL
  Filled 2013-07-01: qty 30

## 2013-07-01 MED ORDER — HYDROCODONE-ACETAMINOPHEN 5-325 MG PO TABS
2.0000 | ORAL_TABLET | Freq: Once | ORAL | Status: AC
  Administered 2013-07-01: 2 via ORAL
  Filled 2013-07-01: qty 2

## 2013-07-01 MED ORDER — HYDROCODONE-ACETAMINOPHEN 5-325 MG PO TABS: ORAL_TABLET | ORAL | Status: DC

## 2013-07-01 MED ORDER — IBUPROFEN 800 MG PO TABS
800.0000 mg | ORAL_TABLET | Freq: Once | ORAL | Status: AC
  Administered 2013-07-01: 800 mg via ORAL
  Filled 2013-07-01: qty 1

## 2013-07-01 NOTE — ED Notes (Signed)
Pt reports all around dental pain. States it is not in one particular area but all around his mouth. Reports he is waiting on his medicaid in order to get his teeth pulled.

## 2013-07-01 NOTE — ED Provider Notes (Signed)
History    CSN: [id number] Arrival date & time 07/01/13  0457  First MD Initiated Contact with Patient 07/01/13 0524     Chief Complaint  Patient presents with  . Dental Pain    HPI patient presents with dental pain worse in the lower incisors. Patient says he is waiting to get his Medicaid card prior to getting his teeth worked on. He says this been a delay because he cannot pay for them with Since he just had a $800 to get his head CAT scan fixed in his car. Pain is worse on eating, hot cold, sharp, nonradiating, not associated with any bad taste in his mouth, no bleeding of the gums, no swelling of the mouth or jaw, no obstruction of his airway, no chest pain, shortness of breath, difficulty swallowing, dysarthria. Past Medical History  Diagnosis Date  . Stroke   . Hypertension    History reviewed. No pertinent past surgical history. Family History  Problem Relation Age of Onset  . Stroke Father   . Heart failure Father    History  Substance Use Topics  . Smoking status: Current Every Day Smoker -- 0.50 packs/day for 27 years    Types: Cigarettes  . Smokeless tobacco: Never Used  . Alcohol Use: No    Review of Systems At least 10pt or greater review of systems completed and are negative except where specified in the HPI.  Allergies  Codeine and Penicillins  Home Medications   Current Outpatient Rx  Name  Route  Sig  Dispense  Refill  . albuterol (PROVENTIL HFA;VENTOLIN HFA) 108 (90 BASE) MCG/ACT inhaler   Inhalation   Inhale 2 puffs into the lungs every 6 (six) hours as needed. For shortness of breath         . alprazolam (XANAX) 2 MG tablet   Oral   Take 2 mg by mouth 4 (four) times daily.         Marland Kitchen aspirin EC 81 MG tablet   Oral   Take 81 mg by mouth daily.           . clindamycin (CLEOCIN) 150 MG capsule   Oral   Take 1 capsule (150 mg total) by mouth 4 (four) times daily.   28 capsule   0   . cyclobenzaprine (FLEXERIL) 10 MG tablet   Oral  Take 10 mg by mouth 3 (three) times daily as needed for muscle spasms.         Marland Kitchen doxycycline (VIBRAMYCIN) 100 MG capsule   Oral   Take 1 capsule (100 mg total) by mouth 2 (two) times daily.   20 capsule   0   . HYDROcodone-acetaminophen (NORCO/VICODIN) 5-325 MG per tablet      Take one-two tabs po q 4-6 hrs prn pain   17 tablet   0    BP 113/70  Temp(Src) 98.9 F (37.2 C) (Oral)  Resp 20  Ht 5\' 9"  (1.753 m)  Wt 200 lb (90.719 kg)  BMI 29.52 kg/m2  SpO2 99% Physical Exam  Nursing notes reviewed.  Electronic medical record reviewed. VITAL SIGNS:   Filed Vitals:   07/01/13 0518  BP: 113/70  Temp: 98.9 F (37.2 C)  TempSrc: Oral  Resp: 20  Height: 5\' 9"  (1.753 m)  Weight: 200 lb (90.719 kg)  SpO2: 99%   CONSTITUTIONAL: Awake, oriented, appears non-toxic HENT: Atraumatic, normocephalic, oral mucosa pink and moist, airway patent. Widespread dental caries - most teeth are black and eroded to  the gumline. No abscess evident, no evidence for Nares patent without drainage. External ears normal. EYES: Conjunctiva clear, EOMI, PERRLA NECK: Trachea midline, non-tender, supple. No lymphadenopathy CARDIOVASCULAR: Normal heart rate, Normal rhythm, No murmurs, rubs, gallops PULMONARY/CHEST: Clear to auscultation, no rhonchi, wheezes, or rales. Symmetrical breath sounds. Non-tender. ABDOMINAL: Non-distended, soft, non-tender - no rebound or guarding.  NEUROLOGIC: Non-focal, moving all four extremities, no gross sensory or motor deficits. SKIN: Warm, Dry, No erythema, No rash  ED Course  Procedures (including critical care time) Labs Reviewed - No data to display No results found. 1. Tooth decay   2. Chronic dental pain     MDM  Patient presents with chronic dental pain, poor dental hygiene, extensive dental caries.  These problems are chronic. No evidence for infection, no evidence for malignancy gingivitis. Patient does have a dentist he is going to followup with me next  month. Patient refuses dental block. Pain medicine and discharged to home  Jones Skene, MD 07/01/13 [phone]

## 2013-07-05 ENCOUNTER — Encounter (HOSPITAL_COMMUNITY): Payer: Self-pay

## 2013-07-05 ENCOUNTER — Emergency Department (HOSPITAL_COMMUNITY)
Admission: EM | Admit: 2013-07-05 | Discharge: 2013-07-05 | Disposition: A | Discharge status: Home / Self Care | Payer: Self-pay | Attending: Emergency Medicine | Admitting: Emergency Medicine

## 2013-07-05 DIAGNOSIS — I10 Essential (primary) hypertension: Secondary | ICD-10-CM | POA: Insufficient documentation

## 2013-07-05 DIAGNOSIS — Y9389 Activity, other specified: Secondary | ICD-10-CM | POA: Insufficient documentation

## 2013-07-05 DIAGNOSIS — Z8673 Personal history of transient ischemic attack (TIA), and cerebral infarction without residual deficits: Secondary | ICD-10-CM | POA: Insufficient documentation

## 2013-07-05 DIAGNOSIS — W268XXA Contact with other sharp object(s), not elsewhere classified, initial encounter: Secondary | ICD-10-CM | POA: Insufficient documentation

## 2013-07-05 DIAGNOSIS — F17200 Nicotine dependence, unspecified, uncomplicated: Secondary | ICD-10-CM | POA: Insufficient documentation

## 2013-07-05 DIAGNOSIS — S61219A Laceration without foreign body of unspecified finger without damage to nail, initial encounter: Secondary | ICD-10-CM

## 2013-07-05 DIAGNOSIS — Z79899 Other long term (current) drug therapy: Secondary | ICD-10-CM | POA: Insufficient documentation

## 2013-07-05 DIAGNOSIS — Z880 Allergy status to penicillin: Secondary | ICD-10-CM | POA: Insufficient documentation

## 2013-07-05 DIAGNOSIS — S61209A Unspecified open wound of unspecified finger without damage to nail, initial encounter: Secondary | ICD-10-CM | POA: Insufficient documentation

## 2013-07-05 DIAGNOSIS — Z7982 Long term (current) use of aspirin: Secondary | ICD-10-CM | POA: Insufficient documentation

## 2013-07-05 DIAGNOSIS — Y929 Unspecified place or not applicable: Secondary | ICD-10-CM | POA: Insufficient documentation

## 2013-07-05 MED ORDER — LIDOCAINE HCL (PF) 2 % IJ SOLN
INTRAMUSCULAR | Status: AC
  Administered 2013-07-05: 18:00:00
  Filled 2013-07-05: qty 10

## 2013-07-05 MED ORDER — HYDROCODONE-ACETAMINOPHEN 5-325 MG PO TABS: 1.0000 | ORAL_TABLET | ORAL | Status: DC | PRN

## 2013-07-05 NOTE — ED Notes (Signed)
Pt reports cut R ring finger on a can.  Bleeding controlled.  EMS dressed wound.

## 2013-07-05 NOTE — ED Notes (Signed)
Steri strips applied by EDP. Dressing applied and splint placed.

## 2013-07-05 NOTE — ED Notes (Addendum)
Pt presents with small circular flap like laceration to his rt ring finger, second knuckle. Bleeding controlled. Pt states cut it while opening a can. Tetanus up to date.

## 2013-07-09 NOTE — ED Provider Notes (Signed)
History    CSN: [id number] Arrival date & time 07/05/13  1638  First MD Initiated Contact with Patient 07/05/13 1730     Chief Complaint  Patient presents with  . Laceration   (Consider location/radiation/quality/duration/timing/severity/associated sxs/prior Treatment) Patient is a 52 y.o. male presenting with skin laceration. The history is provided by the patient.  Laceration Location:  Hand Hand laceration location:  R finger Length (cm):  1 cm flap laceration over ring finger proximal pip dorsal joint Depth:  Through dermis Quality: avulsion   Bleeding: controlled   Time since incident:  20 minutes Injury mechanism: sharp can edge. Pain details:    Quality:  Aching   Severity:  Moderate   Timing:  Constant   Progression:  Unchanged Foreign body present:  No foreign bodies Relieved by:  Nothing Worsened by:  Movement Ineffective treatments:  None tried Tetanus status:  Up to date  Past Medical History  Diagnosis Date  . Stroke   . Hypertension    History reviewed. No pertinent past surgical history. Family History  Problem Relation Age of Onset  . Stroke Father   . Heart failure Father    History  Substance Use Topics  . Smoking status: Current Every Day Smoker -- 0.50 packs/day for 27 years    Types: Cigarettes  . Smokeless tobacco: Never Used  . Alcohol Use: No    Review of Systems  Constitutional: Negative for fever and chills.  HENT: Negative for facial swelling.   Respiratory: Negative for shortness of breath and wheezing.   Skin: Positive for wound.  Neurological: Negative for numbness.    Allergies  Codeine and Penicillins  Home Medications   Current Outpatient Rx  Name  Route  Sig  Dispense  Refill  . albuterol (PROVENTIL HFA;VENTOLIN HFA) 108 (90 BASE) MCG/ACT inhaler   Inhalation   Inhale 2 puffs into the lungs every 6 (six) hours as needed. For shortness of breath         . alprazolam (XANAX) 2 MG tablet   Oral   Take 2 mg by  mouth 4 (four) times daily.         Marland Kitchen aspirin EC 81 MG tablet   Oral   Take 81 mg by mouth daily.           . cyclobenzaprine (FLEXERIL) 10 MG tablet   Oral   Take 10 mg by mouth 3 (three) times daily as needed for muscle spasms.         Marland Kitchen HYDROcodone-acetaminophen (NORCO/VICODIN) 5-325 MG per tablet   Oral   Take 1 tablet by mouth every 4 (four) hours as needed for pain.   10 tablet   0    BP 106/78  Pulse 97  Temp(Src) 98.4 F (36.9 C) (Oral)  Resp 18  SpO2 97% Physical Exam  Constitutional: He is oriented to person, place, and time. He appears well-developed and well-nourished.  HENT:  Head: Normocephalic.  Cardiovascular: Normal rate.   Pulmonary/Chest: Effort normal.  Musculoskeletal: He exhibits tenderness.  1 cm flap laceration right dorsal right finger,  Well approximated.  Hemostatic. Distal sensation intact,  Less than 3 sec cap refill.  Neurological: He is alert and oriented to person, place, and time. No sensory deficit.  Skin: Laceration noted.    ED Course  Procedures (including critical care time)  LACERATION REPAIR Performed by: Burgess Amor Authorized by: Burgess Amor Consent: Verbal consent obtained. Risks and benefits: risks, benefits and alternatives were discussed Consent given  by: patient Patient identity confirmed: provided demographic data Prepped and Draped in normal sterile fashion Wound explored  Laceration Location: right ring finger, dorsal pip  Laceration Length: 1cm  No Foreign Bodies seen or palpated  Anesthesia: digital block Local anesthetic: lidocaine 2% without epinephrine  Anesthetic total: 2 ml  Irrigation method: syringe Amount of cleaning: standard  Skin closure: sterile strips  Number of sutures: sterile strips Technique: sterile strips  Patient tolerance: Patient tolerated the procedure well with no immediate complications.  Labs Reviewed - No data to display No results found. 1. Laceration of finger  of right hand, initial encounter     MDM  Anticipate prn f/u.  Wound care instructions given.  Return here sooner for any signs of infection including redness, swelling, worse pain or drainage of pus.     Burgess Amor, PA-C 07/09/13 2242

## 2013-07-10 NOTE — ED Provider Notes (Signed)
Medical screening examination/treatment/procedure(s) were performed by non-physician practitioner and as supervising physician I was immediately available for consultation/collaboration.   Saryna Kneeland L Joory Gough, MD 07/10/13 0844 

## 2013-08-06 ENCOUNTER — Emergency Department (HOSPITAL_COMMUNITY)
Admission: EM | Admit: 2013-08-06 | Discharge: 2013-08-06 | Disposition: A | Discharge status: Home / Self Care | Payer: Self-pay | Attending: Emergency Medicine | Admitting: Emergency Medicine

## 2013-08-06 ENCOUNTER — Encounter (HOSPITAL_COMMUNITY): Payer: Self-pay

## 2013-08-06 DIAGNOSIS — Z8673 Personal history of transient ischemic attack (TIA), and cerebral infarction without residual deficits: Secondary | ICD-10-CM | POA: Insufficient documentation

## 2013-08-06 DIAGNOSIS — K029 Dental caries, unspecified: Secondary | ICD-10-CM | POA: Insufficient documentation

## 2013-08-06 DIAGNOSIS — Z880 Allergy status to penicillin: Secondary | ICD-10-CM | POA: Insufficient documentation

## 2013-08-06 DIAGNOSIS — F17200 Nicotine dependence, unspecified, uncomplicated: Secondary | ICD-10-CM | POA: Insufficient documentation

## 2013-08-06 DIAGNOSIS — K0889 Other specified disorders of teeth and supporting structures: Secondary | ICD-10-CM

## 2013-08-06 DIAGNOSIS — Z7982 Long term (current) use of aspirin: Secondary | ICD-10-CM | POA: Insufficient documentation

## 2013-08-06 DIAGNOSIS — R51 Headache: Secondary | ICD-10-CM | POA: Insufficient documentation

## 2013-08-06 DIAGNOSIS — I10 Essential (primary) hypertension: Secondary | ICD-10-CM | POA: Insufficient documentation

## 2013-08-06 DIAGNOSIS — K089 Disorder of teeth and supporting structures, unspecified: Secondary | ICD-10-CM | POA: Insufficient documentation

## 2013-08-06 DIAGNOSIS — G8929 Other chronic pain: Secondary | ICD-10-CM | POA: Insufficient documentation

## 2013-08-06 DIAGNOSIS — Z79899 Other long term (current) drug therapy: Secondary | ICD-10-CM | POA: Insufficient documentation

## 2013-08-06 HISTORY — DX: Other chronic pain: G89.29

## 2013-08-06 MED ORDER — IBUPROFEN 800 MG PO TABS: 800.0000 mg | ORAL_TABLET | Freq: Three times a day (TID) | ORAL | Status: DC

## 2013-08-06 MED ORDER — CLINDAMYCIN HCL 150 MG PO CAPS: 150.0000 mg | ORAL_CAPSULE | Freq: Four times a day (QID) | ORAL | Status: DC

## 2013-08-06 NOTE — ED Notes (Signed)
Complain of dental pain for a year

## 2013-08-08 NOTE — ED Provider Notes (Signed)
CSN: [id number]     Arrival date & time 08/06/13  1304 History     First MD Initiated Contact with Patient 08/06/13 1349     Chief Complaint  Patient presents with  . Dental Pain   (Consider location/radiation/quality/duration/timing/severity/associated sxs/prior Treatment) Patient is a 52 y.o. male presenting with tooth pain. The history is provided by the patient.  Dental Pain Location:  Lower Lower teeth location:  24/LL central incisor, 25/RL central incisor, 26/RL lateral incisor, 23/LL lateral incisor, 29/RL 2nd bicuspid and 28/RL 1st bicuspid Quality:  Dull, throbbing and aching Severity:  Moderate Onset quality:  Gradual Timing:  Constant Progression:  Worsening Chronicity:  Chronic Context: dental caries and poor dentition   Context: not abscess, not recent dental surgery and not trauma   Relieved by:  Nothing Worsened by:  Hot food/drink and cold food/drink Ineffective treatments:  None tried Associated symptoms: facial pain   Associated symptoms: no congestion, no difficulty swallowing, no drooling, no facial swelling, no fever, no gum swelling, no headaches, no neck pain, no neck swelling, no oral bleeding, no oral lesions and no trismus   Risk factors: lack of dental care, periodontal disease and smoking   Risk factors: no diabetes     Past Medical History  Diagnosis Date  . Stroke   . Hypertension   . Chronic pain    History reviewed. No pertinent past surgical history. Family History  Problem Relation Age of Onset  . Stroke Father   . Heart failure Father    History  Substance Use Topics  . Smoking status: Current Every Day Smoker -- 0.50 packs/day for 27 years    Types: Cigarettes  . Smokeless tobacco: Never Used  . Alcohol Use: No    Review of Systems  Constitutional: Negative for fever and appetite change.  HENT: Positive for dental problem. Negative for congestion, sore throat, facial swelling, drooling, mouth sores, trouble swallowing, neck  pain and neck stiffness.   Eyes: Negative for pain and visual disturbance.  Neurological: Negative for dizziness, facial asymmetry and headaches.  Hematological: Negative for adenopathy.  All other systems reviewed and are negative.    Allergies  Codeine and Penicillins  Home Medications   Current Outpatient Rx  Name  Route  Sig  Dispense  Refill  . albuterol (PROVENTIL HFA;VENTOLIN HFA) 108 (90 BASE) MCG/ACT inhaler   Inhalation   Inhale 2 puffs into the lungs every 6 (six) hours as needed. For shortness of breath         . alprazolam (XANAX) 2 MG tablet   Oral   Take 2 mg by mouth 4 (four) times daily.         Marland Kitchen aspirin EC 81 MG tablet   Oral   Take 81 mg by mouth daily.           . clindamycin (CLEOCIN) 150 MG capsule   Oral   Take 1 capsule (150 mg total) by mouth 4 (four) times daily.   28 capsule   0   . cyclobenzaprine (FLEXERIL) 10 MG tablet   Oral   Take 10 mg by mouth 3 (three) times daily as needed for muscle spasms.         Marland Kitchen HYDROcodone-acetaminophen (NORCO/VICODIN) 5-325 MG per tablet   Oral   Take 1 tablet by mouth every 4 (four) hours as needed for pain.   10 tablet   0   . ibuprofen (ADVIL,MOTRIN) 800 MG tablet   Oral   Take 1 tablet (  800 mg total) by mouth 3 (three) times daily.   21 tablet   0    BP 113/68  Pulse 91  Temp(Src) 98.4 F (36.9 C) (Oral)  Resp 18  Ht 5\' 9"  (1.753 m)  Wt 200 lb (90.719 kg)  BMI 29.52 kg/m2  SpO2 98% Physical Exam  Nursing note and vitals reviewed. Constitutional: He is oriented to person, place, and time. He appears well-developed and well-nourished. No distress.  HENT:  Head: Normocephalic and atraumatic.  Right Ear: Tympanic membrane and ear canal normal.  Left Ear: Tympanic membrane and ear canal normal.  Mouth/Throat: Uvula is midline, oropharynx is clear and moist and mucous membranes are normal. No trismus in the jaw. Dental caries present. No dental abscesses or edematous.    Widespread  dental decay and periodontal dz.  No trismus, or dental abscess.  No facial edema  Neck: Normal range of motion. Neck supple.  Cardiovascular: Normal rate, regular rhythm, normal heart sounds and intact distal pulses.   No murmur heard. Pulmonary/Chest: Effort normal and breath sounds normal. No respiratory distress.  Musculoskeletal: Normal range of motion.  Lymphadenopathy:    He has no cervical adenopathy.  Neurological: He is alert and oriented to person, place, and time. He exhibits normal muscle tone. Coordination normal.  Skin: Skin is warm and dry.    ED Course   Procedures (including critical care time)  Labs Reviewed - No data to display No results found. 1. Pain, dental     MDM     Previous ed charts reviewed.  Pt has multiple ed visits for dental pain and received oral narcotics multiple times.  I have advised pt that he will need further management of his dental issues with a dentist and that narcotics are not indicated for chronic dental pain.    Pt is otherwise well appearing and stable for discharge.  Hartwell Vandiver L. Trisha Mangle, PA-C 08/08/13 2140

## 2013-08-09 NOTE — ED Provider Notes (Signed)
Medical screening examination/treatment/procedure(s) were performed by non-physician practitioner and as supervising physician I was immediately available for consultation/collaboration.   Freman Lapage W. Lorris Carducci, MD 08/09/13 0755 

## 2013-08-20 ENCOUNTER — Encounter (HOSPITAL_COMMUNITY): Payer: Self-pay | Admitting: Emergency Medicine

## 2013-08-20 ENCOUNTER — Emergency Department (HOSPITAL_COMMUNITY)
Admission: EM | Admit: 2013-08-20 | Discharge: 2013-08-20 | Disposition: A | Discharge status: Home / Self Care | Payer: Self-pay | Attending: Emergency Medicine | Admitting: Emergency Medicine

## 2013-08-20 DIAGNOSIS — Z7982 Long term (current) use of aspirin: Secondary | ICD-10-CM | POA: Insufficient documentation

## 2013-08-20 DIAGNOSIS — K0889 Other specified disorders of teeth and supporting structures: Secondary | ICD-10-CM

## 2013-08-20 DIAGNOSIS — Z8673 Personal history of transient ischemic attack (TIA), and cerebral infarction without residual deficits: Secondary | ICD-10-CM | POA: Insufficient documentation

## 2013-08-20 DIAGNOSIS — Z880 Allergy status to penicillin: Secondary | ICD-10-CM | POA: Insufficient documentation

## 2013-08-20 DIAGNOSIS — F17200 Nicotine dependence, unspecified, uncomplicated: Secondary | ICD-10-CM | POA: Insufficient documentation

## 2013-08-20 DIAGNOSIS — G8929 Other chronic pain: Secondary | ICD-10-CM | POA: Insufficient documentation

## 2013-08-20 DIAGNOSIS — K089 Disorder of teeth and supporting structures, unspecified: Secondary | ICD-10-CM | POA: Insufficient documentation

## 2013-08-20 DIAGNOSIS — K029 Dental caries, unspecified: Secondary | ICD-10-CM | POA: Insufficient documentation

## 2013-08-20 DIAGNOSIS — Z79899 Other long term (current) drug therapy: Secondary | ICD-10-CM | POA: Insufficient documentation

## 2013-08-20 DIAGNOSIS — IMO0002 Reserved for concepts with insufficient information to code with codable children: Secondary | ICD-10-CM | POA: Insufficient documentation

## 2013-08-20 DIAGNOSIS — I10 Essential (primary) hypertension: Secondary | ICD-10-CM | POA: Insufficient documentation

## 2013-08-20 DIAGNOSIS — Z791 Long term (current) use of non-steroidal anti-inflammatories (NSAID): Secondary | ICD-10-CM | POA: Insufficient documentation

## 2013-08-20 MED ORDER — PREDNISONE 50 MG PO TABS
60.0000 mg | ORAL_TABLET | Freq: Once | ORAL | Status: AC
  Administered 2013-08-20: 60 mg via ORAL
  Filled 2013-08-20: qty 1

## 2013-08-20 MED ORDER — IBUPROFEN 800 MG PO TABS
800.0000 mg | ORAL_TABLET | Freq: Once | ORAL | Status: AC
  Administered 2013-08-20: 800 mg via ORAL
  Filled 2013-08-20: qty 1

## 2013-08-20 MED ORDER — PREDNISONE 20 MG PO TABS: 60.0000 mg | ORAL_TABLET | Freq: Every day | ORAL | Status: DC

## 2013-08-20 NOTE — ED Provider Notes (Signed)
CSN: [id number]     Arrival date & time 08/20/13  0014 History   First MD Initiated Contact with Patient 08/20/13 0029     Chief Complaint  Patient presents with  . Dental Pain   (Consider location/radiation/quality/duration/timing/severity/associated sxs/prior Treatment) Patient is a 52 y.o. male presenting with tooth pain.  Dental Pain Associated symptoms: no fever, no headaches and no neck pain    History per patient. Brought his son in for evaluation after anxiety attack, and now he would also like to be evaluated for chronic dental pain. He states that he is taking clindamycin currently. He has had dental pain all his life. He states that all of his teeth hurt. He has no specific tooth pain or area that hurts more than another. No fevers or chills. No trouble swallowing or trouble breathing. Patient specifically requesting Vicodin. No nausea vomiting. No facial swelling. No new symptoms. This has been going on now for months. Past Medical History  Diagnosis Date  . Stroke   . Hypertension   . Chronic pain    History reviewed. No pertinent past surgical history. Family History  Problem Relation Age of Onset  . Stroke Father   . Heart failure Father    History  Substance Use Topics  . Smoking status: Current Every Day Smoker -- 0.50 packs/day for 27 years    Types: Cigarettes  . Smokeless tobacco: Never Used  . Alcohol Use: No    Review of Systems  Constitutional: Negative for fever and chills.  HENT: Positive for dental problem. Negative for neck pain and neck stiffness.   Eyes: Negative for pain.  Respiratory: Negative for shortness of breath.   Cardiovascular: Negative for chest pain.  Gastrointestinal: Negative for abdominal pain.  Genitourinary: Negative for dysuria.  Musculoskeletal: Negative for back pain.  Skin: Negative for rash.  Neurological: Negative for headaches.  All other systems reviewed and are negative.    Allergies  Codeine and  Penicillins  Home Medications   Current Outpatient Rx  Name  Route  Sig  Dispense  Refill  . albuterol (PROVENTIL HFA;VENTOLIN HFA) 108 (90 BASE) MCG/ACT inhaler   Inhalation   Inhale 2 puffs into the lungs every 6 (six) hours as needed. For shortness of breath         . alprazolam (XANAX) 2 MG tablet   Oral   Take 2 mg by mouth 4 (four) times daily.         Marland Kitchen aspirin EC 81 MG tablet   Oral   Take 81 mg by mouth daily.           . clindamycin (CLEOCIN) 150 MG capsule   Oral   Take 1 capsule (150 mg total) by mouth 4 (four) times daily.   28 capsule   0   . cyclobenzaprine (FLEXERIL) 10 MG tablet   Oral   Take 10 mg by mouth 3 (three) times daily as needed for muscle spasms.         Marland Kitchen HYDROcodone-acetaminophen (NORCO/VICODIN) 5-325 MG per tablet   Oral   Take 1 tablet by mouth every 4 (four) hours as needed for pain.   10 tablet   0   . ibuprofen (ADVIL,MOTRIN) 800 MG tablet   Oral   Take 1 tablet (800 mg total) by mouth 3 (three) times daily.   21 tablet   0   . predniSONE (DELTASONE) 20 MG tablet   Oral   Take 3 tablets (60 mg total) by mouth  daily.   15 tablet   0    BP 120/81  Pulse 75  Temp(Src) 97.5 F (36.4 C) (Oral)  Resp 20  SpO2 100% Physical Exam  Constitutional: He is oriented to person, place, and time. He appears well-developed and well-nourished.  HENT:  Head: Normocephalic and atraumatic.  Very poor dentition with multiple caries. No areas of fluctuance or gingival swelling. No trismus. No facial swelling or erythema. Mild tenderness to dentition throughout  Eyes: EOM are normal. Pupils are equal, round, and reactive to light.  Neck: Neck supple.  Cardiovascular: Regular rhythm and intact distal pulses.   Pulmonary/Chest: Effort normal and breath sounds normal. No respiratory distress.  Abdominal: Soft. He exhibits no distension. There is no tenderness.  Musculoskeletal: Normal range of motion. He exhibits no edema.  Neurological:  He is alert and oriented to person, place, and time.  Skin: Skin is warm and dry.    ED Course  Procedures (including critical care time)  NSAIDs provided, holding narcotics given chronic nature of symptoms  Patient already on antibiotics agrees to continue Patient agrees to keep his scheduled dental appointment in [facility] Stable for discharge home and outpatient followup Prescription for prednisone provided  MDM   1. Pain, dental     Medication provided Previous records, vital signs and nursing notes reviewed    Sunnie Nielsen, MD 08/20/13 [phone]

## 2013-08-20 NOTE — ED Notes (Signed)
Pt c/o dental pain x months. Pt states he is going to gave oral surgery next month.

## 2013-09-07 ENCOUNTER — Emergency Department (HOSPITAL_COMMUNITY): Admission: EM | Admit: 2013-09-07 | Discharge: 2013-09-07 | Discharge status: Against Medical Advice | Payer: Self-pay

## 2013-09-27 ENCOUNTER — Emergency Department (HOSPITAL_COMMUNITY)
Admission: EM | Admit: 2013-09-27 | Discharge: 2013-09-27 | Disposition: A | Discharge status: Home / Self Care | Payer: Self-pay | Attending: Emergency Medicine | Admitting: Emergency Medicine

## 2013-09-27 ENCOUNTER — Encounter (HOSPITAL_COMMUNITY): Payer: Self-pay | Admitting: Emergency Medicine

## 2013-09-27 DIAGNOSIS — Z880 Allergy status to penicillin: Secondary | ICD-10-CM | POA: Insufficient documentation

## 2013-09-27 DIAGNOSIS — M545 Low back pain, unspecified: Secondary | ICD-10-CM | POA: Insufficient documentation

## 2013-09-27 DIAGNOSIS — Z79899 Other long term (current) drug therapy: Secondary | ICD-10-CM | POA: Insufficient documentation

## 2013-09-27 DIAGNOSIS — F17200 Nicotine dependence, unspecified, uncomplicated: Secondary | ICD-10-CM | POA: Insufficient documentation

## 2013-09-27 DIAGNOSIS — Z7982 Long term (current) use of aspirin: Secondary | ICD-10-CM | POA: Insufficient documentation

## 2013-09-27 DIAGNOSIS — G8929 Other chronic pain: Secondary | ICD-10-CM | POA: Insufficient documentation

## 2013-09-27 DIAGNOSIS — Z8673 Personal history of transient ischemic attack (TIA), and cerebral infarction without residual deficits: Secondary | ICD-10-CM | POA: Insufficient documentation

## 2013-09-27 DIAGNOSIS — M549 Dorsalgia, unspecified: Secondary | ICD-10-CM

## 2013-09-27 DIAGNOSIS — I10 Essential (primary) hypertension: Secondary | ICD-10-CM | POA: Insufficient documentation

## 2013-09-27 MED ORDER — IBUPROFEN 800 MG PO TABS
800.0000 mg | ORAL_TABLET | Freq: Once | ORAL | Status: AC
  Administered 2013-09-27: 800 mg via ORAL
  Filled 2013-09-27: qty 1

## 2013-09-27 NOTE — ED Provider Notes (Signed)
CSN: [id number]     Arrival date & time 09/27/13  0501 History   First MD Initiated Contact with Patient 09/27/13 0503     Chief Complaint  Patient presents with  . Back Pain    Patient is a 52 y.o. male presenting with back pain. The history is provided by the patient.  Back Pain Location:  Lumbar spine Quality:  Aching Pain severity:  Moderate Onset quality:  Sudden Timing:  Constant Progression:  Worsening Chronicity:  Recurrent Context: lifting heavy objects   Relieved by:  Nothing Worsened by:  Movement Associated symptoms: no bladder incontinence, no bowel incontinence and no weakness    Pt reports he attempted to lift refrigerator by himself and he had immediate onset of low back pain No falls/trauma reported No new weakness reported No fever reported No h/o low back surgery Past Medical History  Diagnosis Date  . Stroke   . Hypertension   . Chronic pain    History reviewed. No pertinent past surgical history. Family History  Problem Relation Age of Onset  . Stroke Father   . Heart failure Father    History  Substance Use Topics  . Smoking status: Current Every Day Smoker -- 0.50 packs/day for 27 years    Types: Cigarettes  . Smokeless tobacco: Never Used  . Alcohol Use: No    Review of Systems  Gastrointestinal: Negative for bowel incontinence.  Genitourinary: Negative for bladder incontinence.  Musculoskeletal: Positive for back pain.  Neurological: Negative for weakness.    Allergies  Codeine and Penicillins  Home Medications   Current Outpatient Rx  Name  Route  Sig  Dispense  Refill  . albuterol (PROVENTIL HFA;VENTOLIN HFA) 108 (90 BASE) MCG/ACT inhaler   Inhalation   Inhale 2 puffs into the lungs every 6 (six) hours as needed. For shortness of breath         . alprazolam (XANAX) 2 MG tablet   Oral   Take 2 mg by mouth 4 (four) times daily.         Marland Kitchen aspirin EC 81 MG tablet   Oral   Take 81 mg by mouth daily.           .  cyclobenzaprine (FLEXERIL) 10 MG tablet   Oral   Take 10 mg by mouth 3 (three) times daily as needed for muscle spasms.          BP 119/72  Pulse 65  Temp(Src) 97.8 F (36.6 C) (Oral)  Resp 18  Ht 5\' 9"  (1.753 m)  Wt 200 lb (90.719 kg)  BMI 29.52 kg/m2  SpO2 100% Physical Exam CONSTITUTIONAL: Well developed/well nourished HEAD: Normocephalic/atraumatic EYES: EOMI ENMT: Mucous membranes moist NECK: supple no meningeal signs SPINE:lumbar spinal tenderness.  No bruising/crepitance/stepoffs noted to spine CV: S1/S2 noted, no murmurs/rubs/gallops noted LUNGS: Lungs are clear to auscultation bilaterally, no apparent distress ABDOMEN: soft, nontender, no rebound or guarding NEURO: Awake/alert,equal distal motor: hip flexion/knee flexion/extension, ankle dorsi/plantar flexion,   Pt is able to ambulate without difficulty EXTREMITIES: pulses normal, full ROM SKIN: warm, color normal PSYCH: no abnormalities of mood noted   ED Course  Procedures   Pt resting comfortably on reassessment.  I advised f/u with his PCP today for further pain management.  He has no signs of neuro deficit.  I don't feel emergent imaging/workup warranted at this time.  We discussed strict return precautions.    MDM   1. Back pain   Nursing notes including past medical history  and social history reviewed and considered in documentation Narcotic database reviewed    Joya Gaskins, MD 09/27/13 [phone]

## 2013-09-27 NOTE — ED Notes (Signed)
Patient states was moving a refrigerator last night by himself and c/o lower back pain; states has been up in a chair all night.

## 2013-10-03 DIAGNOSIS — Z880 Allergy status to penicillin: Secondary | ICD-10-CM | POA: Insufficient documentation

## 2013-10-03 DIAGNOSIS — Z8673 Personal history of transient ischemic attack (TIA), and cerebral infarction without residual deficits: Secondary | ICD-10-CM | POA: Insufficient documentation

## 2013-10-03 DIAGNOSIS — I10 Essential (primary) hypertension: Secondary | ICD-10-CM | POA: Insufficient documentation

## 2013-10-03 DIAGNOSIS — IMO0002 Reserved for concepts with insufficient information to code with codable children: Secondary | ICD-10-CM | POA: Insufficient documentation

## 2013-10-03 DIAGNOSIS — Z79899 Other long term (current) drug therapy: Secondary | ICD-10-CM | POA: Insufficient documentation

## 2013-10-03 DIAGNOSIS — G8929 Other chronic pain: Secondary | ICD-10-CM | POA: Insufficient documentation

## 2013-10-03 DIAGNOSIS — L240 Irritant contact dermatitis due to detergents: Secondary | ICD-10-CM | POA: Insufficient documentation

## 2013-10-03 DIAGNOSIS — F17200 Nicotine dependence, unspecified, uncomplicated: Secondary | ICD-10-CM | POA: Insufficient documentation

## 2013-10-03 DIAGNOSIS — Z7982 Long term (current) use of aspirin: Secondary | ICD-10-CM | POA: Insufficient documentation

## 2013-10-04 ENCOUNTER — Encounter (HOSPITAL_COMMUNITY): Payer: Self-pay | Admitting: Emergency Medicine

## 2013-10-04 ENCOUNTER — Emergency Department (HOSPITAL_COMMUNITY)
Admission: EM | Admit: 2013-10-04 | Discharge: 2013-10-04 | Disposition: A | Discharge status: Home / Self Care | Payer: Self-pay | Attending: Emergency Medicine | Admitting: Emergency Medicine

## 2013-10-04 DIAGNOSIS — T7840XA Allergy, unspecified, initial encounter: Secondary | ICD-10-CM

## 2013-10-04 MED ORDER — PREDNISONE 50 MG PO TABS
60.0000 mg | ORAL_TABLET | Freq: Once | ORAL | Status: AC
  Administered 2013-10-04: 60 mg via ORAL
  Filled 2013-10-04 (×2): qty 1

## 2013-10-04 MED ORDER — DIPHENHYDRAMINE HCL 25 MG PO CAPS
25.0000 mg | ORAL_CAPSULE | Freq: Once | ORAL | Status: AC
  Administered 2013-10-04: 25 mg via ORAL
  Filled 2013-10-04: qty 1

## 2013-10-04 MED ORDER — FAMOTIDINE 20 MG PO TABS: 20.0000 mg | ORAL_TABLET | Freq: Two times a day (BID) | ORAL | Status: DC

## 2013-10-04 MED ORDER — FAMOTIDINE 20 MG PO TABS
20.0000 mg | ORAL_TABLET | Freq: Once | ORAL | Status: AC
  Administered 2013-10-04: 20 mg via ORAL
  Filled 2013-10-04: qty 1

## 2013-10-04 MED ORDER — DIPHENHYDRAMINE HCL 25 MG PO CAPS: 25.0000 mg | ORAL_CAPSULE | Freq: Four times a day (QID) | ORAL | Status: DC | PRN

## 2013-10-04 MED ORDER — IBUPROFEN 800 MG PO TABS
800.0000 mg | ORAL_TABLET | Freq: Once | ORAL | Status: AC
  Administered 2013-10-04: 800 mg via ORAL
  Filled 2013-10-04: qty 1

## 2013-10-04 MED ORDER — PREDNISONE 20 MG PO TABS: 60.0000 mg | ORAL_TABLET | Freq: Every day | ORAL | Status: DC

## 2013-10-04 NOTE — ED Provider Notes (Signed)
CSN: [id number]     Arrival date & time 10/03/13  2306 History   First MD Initiated Contact with Patient 10/04/13 0244     Chief Complaint  Patient presents with  . Pruritis   (Consider location/radiation/quality/duration/timing/severity/associated sxs/prior Treatment) HPI History provided by patient. Rash onset tonight after wearing close washed with new detergent Gain.  Patient believes that he may have had a similar reaction in the past when he used this detergent. No throat swelling or difficulty breathing. No tongue or lip swelling. Rashes diffuse and itchy. He did not take any medications at home but presents here for evaluation. Symptoms moderate in severity. No new medications otherwise. No other known exposures. Past Medical History  Diagnosis Date  . Stroke   . Hypertension   . Chronic pain    History reviewed. No pertinent past surgical history. Family History  Problem Relation Age of Onset  . Stroke Father   . Heart failure Father    History  Substance Use Topics  . Smoking status: Current Every Day Smoker -- 0.50 packs/day for 27 years    Types: Cigarettes  . Smokeless tobacco: Never Used  . Alcohol Use: No    Review of Systems  Constitutional: Negative for fever and chills.  Eyes: Negative for pain.  Respiratory: Negative for shortness of breath and wheezing.   Cardiovascular: Negative for chest pain.  Gastrointestinal: Negative for abdominal pain.  Genitourinary: Negative for dysuria.  Musculoskeletal: Negative for back pain, neck pain and neck stiffness.  Skin: Positive for rash.  Neurological: Negative for headaches.  All other systems reviewed and are negative.    Allergies  Codeine and Penicillins  Home Medications   Current Outpatient Rx  Name  Route  Sig  Dispense  Refill  . albuterol (PROVENTIL HFA;VENTOLIN HFA) 108 (90 BASE) MCG/ACT inhaler   Inhalation   Inhale 2 puffs into the lungs every 6 (six) hours as needed. For shortness of breath         . alprazolam (XANAX) 2 MG tablet   Oral   Take 2 mg by mouth 4 (four) times daily.         Marland Kitchen aspirin EC 81 MG tablet   Oral   Take 81 mg by mouth daily.           . cyclobenzaprine (FLEXERIL) 10 MG tablet   Oral   Take 10 mg by mouth 3 (three) times daily as needed for muscle spasms.         . diphenhydrAMINE (BENADRYL) 25 mg capsule   Oral   Take 1 capsule (25 mg total) by mouth every 6 (six) hours as needed for itching.   30 capsule   0   . famotidine (PEPCID) 20 MG tablet   Oral   Take 1 tablet (20 mg total) by mouth 2 (two) times daily.   30 tablet   0   . predniSONE (DELTASONE) 20 MG tablet   Oral   Take 3 tablets (60 mg total) by mouth daily.   15 tablet   0    BP 109/69  Pulse 83  Temp(Src) 97.8 F (36.6 C) (Oral)  Resp 22  Ht 5\' 9"  (1.753 m)  Wt 200 lb (90.719 kg)  BMI 29.52 kg/m2  SpO2 98% Physical Exam  Constitutional: He is oriented to person, place, and time. He appears well-developed and well-nourished.  HENT:  Head: Normocephalic and atraumatic.  Mouth/Throat: Oropharynx is clear and moist. No oropharyngeal exudate.  Eyes: EOM are  normal. Pupils are equal, round, and reactive to light.  Neck: Neck supple.  Cardiovascular: Normal rate, regular rhythm and intact distal pulses.   Pulmonary/Chest: Effort normal and breath sounds normal. No stridor. No respiratory distress. He has no wheezes.  Musculoskeletal: Normal range of motion. He exhibits no edema.  Neurological: He is alert and oriented to person, place, and time.  Skin: Skin is warm and dry.  Diffuse erythematous urticarial rash. No petechiae or purpura.    ED Course  Procedures (including critical care time)   Benadryl, prednisone, Pepcid provided  5:28 AM on recheck is sleeping comfortably and feeling better with rash and itching resolved. Airway remains uninvolved.  Plan discharge home with prescriptions for prednisone, Benadryl and Pepcid. Strict return precautions  verbalized as understood.     MDM   1. Allergic reaction, initial encounter    Medications provided and condition improved Vital signs and nurses notes reviewed    Sunnie Nielsen, MD 10/04/13 [phone]

## 2013-10-04 NOTE — ED Notes (Signed)
Pt reports he started itching around 2200. Pt states he used Gain for the first time & itching started tonight.

## 2013-12-15 ENCOUNTER — Emergency Department (HOSPITAL_COMMUNITY)
Admission: EM | Admit: 2013-12-15 | Discharge: 2013-12-15 | Disposition: A | Discharge status: Home / Self Care | Payer: Self-pay | Attending: Emergency Medicine | Admitting: Emergency Medicine

## 2013-12-15 ENCOUNTER — Encounter (HOSPITAL_COMMUNITY): Payer: Self-pay | Admitting: Emergency Medicine

## 2013-12-15 DIAGNOSIS — M549 Dorsalgia, unspecified: Secondary | ICD-10-CM | POA: Insufficient documentation

## 2013-12-15 DIAGNOSIS — F17200 Nicotine dependence, unspecified, uncomplicated: Secondary | ICD-10-CM | POA: Insufficient documentation

## 2013-12-15 DIAGNOSIS — Z880 Allergy status to penicillin: Secondary | ICD-10-CM | POA: Insufficient documentation

## 2013-12-15 DIAGNOSIS — I10 Essential (primary) hypertension: Secondary | ICD-10-CM | POA: Insufficient documentation

## 2013-12-15 DIAGNOSIS — IMO0002 Reserved for concepts with insufficient information to code with codable children: Secondary | ICD-10-CM | POA: Insufficient documentation

## 2013-12-15 DIAGNOSIS — Z8673 Personal history of transient ischemic attack (TIA), and cerebral infarction without residual deficits: Secondary | ICD-10-CM | POA: Insufficient documentation

## 2013-12-15 DIAGNOSIS — G8929 Other chronic pain: Secondary | ICD-10-CM | POA: Insufficient documentation

## 2013-12-15 DIAGNOSIS — M2550 Pain in unspecified joint: Secondary | ICD-10-CM | POA: Insufficient documentation

## 2013-12-15 DIAGNOSIS — Z7982 Long term (current) use of aspirin: Secondary | ICD-10-CM | POA: Insufficient documentation

## 2013-12-15 DIAGNOSIS — Z79899 Other long term (current) drug therapy: Secondary | ICD-10-CM | POA: Insufficient documentation

## 2013-12-15 DIAGNOSIS — L509 Urticaria, unspecified: Secondary | ICD-10-CM

## 2013-12-15 MED ORDER — HYDROXYZINE HCL 25 MG PO TABS: 25.0000 mg | ORAL_TABLET | Freq: Four times a day (QID) | ORAL | Status: DC

## 2013-12-15 MED ORDER — DEXAMETHASONE SODIUM PHOSPHATE 4 MG/ML IJ SOLN
8.0000 mg | Freq: Once | INTRAMUSCULAR | Status: AC
  Administered 2013-12-15: 8 mg via INTRAMUSCULAR
  Filled 2013-12-15: qty 2

## 2013-12-15 MED ORDER — HYDROXYZINE HCL 50 MG/ML IM SOLN: 50.0000 mg | Freq: Once | INTRAMUSCULAR | Status: DC

## 2013-12-15 MED ORDER — HYDROXYZINE HCL 25 MG PO TABS
50.0000 mg | ORAL_TABLET | Freq: Once | ORAL | Status: AC
  Administered 2013-12-15: 50 mg via ORAL
  Filled 2013-12-15: qty 2

## 2013-12-15 NOTE — ED Notes (Signed)
Pt reports hives for 2 hours.

## 2013-12-15 NOTE — ED Provider Notes (Signed)
CSN: [id number]     Arrival date & time 12/15/13  1229 History   First MD Initiated Contact with Patient 12/15/13 1236     Chief Complaint  Patient presents with  . Urticaria   (Consider location/radiation/quality/duration/timing/severity/associated sxs/prior Treatment) Patient is a 52 y.o. male presenting with urticaria. The history is provided by the patient.  Urticaria This is a recurrent problem. The current episode started today. The problem occurs intermittently. The problem has been gradually worsening. Associated symptoms include arthralgias and a rash. Pertinent negatives include no abdominal pain, chest pain, coughing, nausea, neck pain, sore throat or vomiting. Nothing aggravates the symptoms. He has tried nothing for the symptoms. The treatment provided no relief.    Past Medical History  Diagnosis Date  . Stroke   . Hypertension   . Chronic pain    History reviewed. No pertinent past surgical history. Family History  Problem Relation Age of Onset  . Stroke Father   . Heart failure Father    History  Substance Use Topics  . Smoking status: Current Every Day Smoker -- 0.50 packs/day for 27 years    Types: Cigarettes  . Smokeless tobacco: Never Used  . Alcohol Use: No    Review of Systems  Constitutional: Negative for activity change.       All ROS Neg except as noted in HPI  HENT: Negative for nosebleeds and sore throat.   Eyes: Negative for photophobia and discharge.  Respiratory: Negative for cough, shortness of breath and wheezing.   Cardiovascular: Negative for chest pain and palpitations.  Gastrointestinal: Negative for nausea, vomiting, abdominal pain and blood in stool.  Genitourinary: Negative for dysuria, frequency and hematuria.  Musculoskeletal: Positive for arthralgias and back pain. Negative for neck pain.  Skin: Positive for rash.  Neurological: Negative for dizziness, seizures and speech difficulty.  Psychiatric/Behavioral: Negative for  hallucinations and confusion.    Allergies  Codeine and Penicillins  Home Medications   Current Outpatient Rx  Name  Route  Sig  Dispense  Refill  . albuterol (PROVENTIL HFA;VENTOLIN HFA) 108 (90 BASE) MCG/ACT inhaler   Inhalation   Inhale 2 puffs into the lungs every 6 (six) hours as needed. For shortness of breath         . alprazolam (XANAX) 2 MG tablet   Oral   Take 2 mg by mouth 4 (four) times daily.         Marland Kitchen aspirin EC 81 MG tablet   Oral   Take 81 mg by mouth daily.           . cyclobenzaprine (FLEXERIL) 10 MG tablet   Oral   Take 10 mg by mouth 3 (three) times daily as needed for muscle spasms.         . diphenhydrAMINE (BENADRYL) 25 mg capsule   Oral   Take 1 capsule (25 mg total) by mouth every 6 (six) hours as needed for itching.   30 capsule   0   . famotidine (PEPCID) 20 MG tablet   Oral   Take 1 tablet (20 mg total) by mouth 2 (two) times daily.   30 tablet   0   . predniSONE (DELTASONE) 20 MG tablet   Oral   Take 3 tablets (60 mg total) by mouth daily.   15 tablet   0    BP 116/71  Pulse 71  Temp(Src) 97.4 F (36.3 C) (Oral)  Resp 18  SpO2 98% Physical Exam  Nursing note and vitals reviewed.  Constitutional: He is oriented to person, place, and time. He appears well-developed and well-nourished.  Non-toxic appearance.  HENT:  Head: Normocephalic.  Right Ear: Tympanic membrane and external ear normal.  Left Ear: Tympanic membrane and external ear normal.  Eyes: EOM and lids are normal. Pupils are equal, round, and reactive to light.  Neck: Normal range of motion. Neck supple. Carotid bruit is not present.  Cardiovascular: Normal rate, regular rhythm, normal heart sounds, intact distal pulses and normal pulses.   Pulmonary/Chest: Breath sounds normal. No respiratory distress.  Abdominal: Soft. Bowel sounds are normal. There is no tenderness. There is no guarding.  Musculoskeletal: Normal range of motion.  Lymphadenopathy:        Head (right side): No submandibular adenopathy present.       Head (left side): No submandibular adenopathy present.    He has no cervical adenopathy.  Neurological: He is alert and oriented to person, place, and time. He has normal strength. No cranial nerve deficit or sensory deficit.  Skin: Skin is warm and dry.  There are reddened areas on the hands. There are a few dried in areas with increased redness on the right hip and pelvis. There are a few reddened areas of the scalp with some scratches present.  Psychiatric: He has a normal mood and affect. His speech is normal.    ED Course  Procedures (including critical care time) Labs Review Labs Reviewed - No data to display Imaging Review No results found.  EKG Interpretation   None       MDM  No diagnosis found. **I have reviewed nursing notes, vital signs, and all appropriate lab and imaging results for this patient.*  Patient states that he has been under a great deal of pressure recently. He thinks that this is why he is breaking out in hives. He denies any new foods or other changes. No changes in medications.   Patient treated in the emergency department with intramuscular Decadron and Vistaril. Prescription for Vistaril given to the patient.  Kathie Dike, PA-C 12/15/13 1353

## 2013-12-15 NOTE — ED Provider Notes (Signed)
Medical screening examination/treatment/procedure(s) were performed by non-physician practitioner and as supervising physician I was immediately available for consultation/collaboration.  EKG Interpretation   None         Karyssa Amaral T Kobyn Kray, MD 12/15/13 1458 

## 2014-02-18 ENCOUNTER — Encounter (HOSPITAL_COMMUNITY): Payer: Self-pay | Admitting: Emergency Medicine

## 2014-02-18 ENCOUNTER — Emergency Department (HOSPITAL_COMMUNITY)
Admission: EM | Admit: 2014-02-18 | Discharge: 2014-02-18 | Disposition: A | Discharge status: Home / Self Care | Payer: Self-pay | Attending: Emergency Medicine | Admitting: Emergency Medicine

## 2014-02-18 DIAGNOSIS — Z880 Allergy status to penicillin: Secondary | ICD-10-CM | POA: Insufficient documentation

## 2014-02-18 DIAGNOSIS — K0889 Other specified disorders of teeth and supporting structures: Secondary | ICD-10-CM

## 2014-02-18 DIAGNOSIS — Z8673 Personal history of transient ischemic attack (TIA), and cerebral infarction without residual deficits: Secondary | ICD-10-CM | POA: Insufficient documentation

## 2014-02-18 DIAGNOSIS — Z7982 Long term (current) use of aspirin: Secondary | ICD-10-CM | POA: Insufficient documentation

## 2014-02-18 DIAGNOSIS — Z79899 Other long term (current) drug therapy: Secondary | ICD-10-CM | POA: Insufficient documentation

## 2014-02-18 DIAGNOSIS — K089 Disorder of teeth and supporting structures, unspecified: Secondary | ICD-10-CM | POA: Insufficient documentation

## 2014-02-18 DIAGNOSIS — IMO0002 Reserved for concepts with insufficient information to code with codable children: Secondary | ICD-10-CM | POA: Insufficient documentation

## 2014-02-18 DIAGNOSIS — F17200 Nicotine dependence, unspecified, uncomplicated: Secondary | ICD-10-CM | POA: Insufficient documentation

## 2014-02-18 DIAGNOSIS — G8929 Other chronic pain: Secondary | ICD-10-CM | POA: Insufficient documentation

## 2014-02-18 DIAGNOSIS — I10 Essential (primary) hypertension: Secondary | ICD-10-CM | POA: Insufficient documentation

## 2014-02-18 MED ORDER — KETOROLAC TROMETHAMINE 10 MG PO TABS
10.0000 mg | ORAL_TABLET | Freq: Once | ORAL | Status: AC
  Administered 2014-02-18: 10 mg via ORAL
  Filled 2014-02-18: qty 1

## 2014-02-18 MED ORDER — TRAMADOL HCL 50 MG PO TABS: ORAL_TABLET | ORAL | Status: DC

## 2014-02-18 MED ORDER — CLINDAMYCIN HCL 150 MG PO CAPS
300.0000 mg | ORAL_CAPSULE | Freq: Once | ORAL | Status: AC
  Administered 2014-02-18: 300 mg via ORAL
  Filled 2014-02-18: qty 2

## 2014-02-18 MED ORDER — ONDANSETRON HCL 4 MG PO TABS
4.0000 mg | ORAL_TABLET | Freq: Once | ORAL | Status: AC
Start: 2014-02-18 — End: 2014-02-18
  Administered 2014-02-18: 4 mg via ORAL
  Filled 2014-02-18: qty 1

## 2014-02-18 MED ORDER — CLINDAMYCIN HCL 150 MG PO CAPS: 150.0000 mg | ORAL_CAPSULE | Freq: Three times a day (TID) | ORAL | Status: DC

## 2014-02-18 MED ORDER — IBUPROFEN 800 MG PO TABS: 800.0000 mg | ORAL_TABLET | Freq: Three times a day (TID) | ORAL | Status: DC

## 2014-02-18 NOTE — Discharge Instructions (Signed)
Please use clindamycin 3 times daily with food. Please use ibuprofen one half or 1 tablet 3 times daily for inflammation and swelling. Use Ultram one or 2 tablets every 6 hours as needed for pain. This medication may cause drowsiness, please use with caution. Please see your oral surgeon as soon Dental Pain Toothache is pain in or around a tooth. It may get worse with chewing or with cold or heat.  HOME CARE  Your dentist may use a numbing medicine during treatment. If so, you may need to avoid eating until the medicine wears off. Ask your dentist about this.  Only take medicine as told by your dentist or doctor.  Avoid chewing food near the painful tooth until after all treatment is done. Ask your dentist about this. GET HELP RIGHT AWAY IF:   The problem gets worse or new problems appear.  You have a fever.  There is redness and puffiness (swelling) of the face, jaw, or neck.  You cannot open your mouth.  There is pain in the jaw.  There is very bad pain that is not helped by medicine. MAKE SURE YOU:   Understand these instructions.  Will watch your condition.  Will get help right away if you are not doing well or get worse. Document Released: 05/26/2008 Document Revised: 03/01/2012 Document Reviewed: 05/26/2008 Exit[employer] Patient Information 2014 [employer] [place]  as possible for you management of your dental pain.

## 2014-02-18 NOTE — ED Provider Notes (Signed)
  Medical screening examination/treatment/procedure(s) were performed by non-physician practitioner and as supervising physician I was immediately available for consultation/collaboration.      Joniah Bednarski, MD 02/18/14 1418 

## 2014-02-18 NOTE — ED Notes (Signed)
Pt complain of dental pain for two days

## 2014-02-18 NOTE — ED Provider Notes (Signed)
CSN: [id number]     Arrival date & time 02/18/14  1259 History   First MD Initiated Contact with Patient 02/18/14 1355     Chief Complaint  Patient presents with  . Dental Pain     (Consider location/radiation/quality/duration/timing/severity/associated sxs/prior Treatment) HPI Comments: Patient is a 53 year old male who presents to the emergency department with 2-3 days of increasing dental pain. The patient states he has had problems with his teeth for quite some time. He has not had insurance or financial resources to have this taken care of. The patient states he was recently approved for Medicaid, and he is attempting to schedule an [place] to assist him with this. He states at this time he had severe pain of the upper gum area. He has not had high fevers. He has not had vomiting. States the pain however has Him up for 2 days and 2 nights.  Patient is a 53 y.o. male presenting with tooth pain. The history is provided by the patient.  Dental Pain Location:  Upper Associated symptoms: no neck pain     Past Medical History  Diagnosis Date  . Stroke   . Hypertension   . Chronic pain    History reviewed. No pertinent past surgical history. Family History  Problem Relation Age of Onset  . Stroke Father   . Heart failure Father    History  Substance Use Topics  . Smoking status: Current Every Day Smoker -- 0.50 packs/day for 27 years    Types: Cigarettes  . Smokeless tobacco: Never Used  . Alcohol Use: No    Review of Systems  Constitutional: Negative for activity change.       All ROS Neg except as noted in HPI  HENT: Positive for dental problem. Negative for nosebleeds.   Eyes: Negative for photophobia and discharge.  Respiratory: Negative for cough, shortness of breath and wheezing.   Cardiovascular: Negative for chest pain and palpitations.  Gastrointestinal: Negative for abdominal pain and blood in stool.  Genitourinary:  Negative for dysuria, frequency and hematuria.  Musculoskeletal: Positive for back pain. Negative for arthralgias and neck pain.  Skin: Negative.   Neurological: Negative for dizziness, seizures and speech difficulty.  Psychiatric/Behavioral: Negative for hallucinations and confusion.      Allergies  Codeine and Penicillins  Home Medications   Current Outpatient Rx  Name  Route  Sig  Dispense  Refill  . albuterol (PROVENTIL HFA;VENTOLIN HFA) 108 (90 BASE) MCG/ACT inhaler   Inhalation   Inhale 2 puffs into the lungs every 6 (six) hours as needed. For shortness of breath         . alprazolam (XANAX) 2 MG tablet   Oral   Take 2 mg by mouth 4 (four) times daily.         Marland Kitchen. aspirin EC 81 MG tablet   Oral   Take 81 mg by mouth daily.           . cyclobenzaprine (FLEXERIL) 10 MG tablet   Oral   Take 10 mg by mouth 3 (three) times daily as needed for muscle spasms.         . diphenhydrAMINE (BENADRYL) 25 mg capsule   Oral   Take 1 capsule (25 mg total) by mouth every 6 (six) hours as needed for itching.   30 capsule   0   . famotidine (PEPCID) 20 MG tablet   Oral   Take 1 tablet (20 mg total)  by mouth 2 (two) times daily.   30 tablet   0   . hydrOXYzine (ATARAX/VISTARIL) 25 MG tablet   Oral   Take 1 tablet (25 mg total) by mouth every 6 (six) hours.   15 tablet   0   . predniSONE (DELTASONE) 20 MG tablet   Oral   Take 3 tablets (60 mg total) by mouth daily.   15 tablet   0    BP 116/71  Pulse 77  Temp(Src) 97.8 F (36.6 C) (Oral)  Resp 18  Ht 5\' 9"  (1.753 m)  Wt 200 lb (90.719 kg)  BMI 29.52 kg/m2  SpO2 100% Physical Exam  Nursing note and vitals reviewed. Constitutional: He is oriented to person, place, and time. He appears well-developed and well-nourished.  Non-toxic appearance.  HENT:  Head: Normocephalic.  Right Ear: Tympanic membrane and external ear normal.  Left Ear: Tympanic membrane and external ear normal.  All teeth of the upper gum  R. decayed to the gum line. There is mild-to-moderate swelling of the gum. No visible abscess appreciated. The airway is patent. There is no swelling under the tongue.  Eyes: EOM and lids are normal. Pupils are equal, round, and reactive to light.  Neck: Normal range of motion. Neck supple. Carotid bruit is not present.  Cardiovascular: Normal rate, regular rhythm, normal heart sounds, intact distal pulses and normal pulses.   Pulmonary/Chest: Breath sounds normal. No respiratory distress.  Abdominal: Soft. Bowel sounds are normal. There is no tenderness. There is no guarding.  Musculoskeletal: Normal range of motion.  Lymphadenopathy:       Head (right side): No submandibular adenopathy present.       Head (left side): No submandibular adenopathy present.    He has no cervical adenopathy.  Neurological: He is alert and oriented to person, place, and time. He has normal strength. No cranial nerve deficit or sensory deficit.  Skin: Skin is warm and dry.  Psychiatric: He has a normal mood and affect. His speech is normal.    ED Course  Procedures (including critical care time) Labs Review Labs Reviewed - No data to display Imaging Review No results found.   EKG Interpretation None      MDM 53 year old male presents to the emergency department with complaint of 2-3 days of dental pain. States he has having pain for 2 days and 2 nights and has not slept well during the time. Patient states he has attempted to schedule appointment with an oral surgeon as sone as he can be approved by Medicaid.  No visible abscess present. The examination is negative for Ludwig's angina. Prescription for clindamycin, ibuprofen, and Ultram given to the patient.    Final diagnoses:  None    *I have reviewed nursing notes, vital signs, and all appropriate lab and imaging results for this patient.Kathie Dike, PA-C 02/18/14 1406

## 2014-05-23 ENCOUNTER — Encounter (HOSPITAL_COMMUNITY): Payer: Self-pay | Admitting: Emergency Medicine

## 2014-05-23 ENCOUNTER — Emergency Department (HOSPITAL_COMMUNITY)
Admission: EM | Admit: 2014-05-23 | Discharge: 2014-05-24 | Disposition: A | Discharge status: Home / Self Care | Payer: Self-pay | Attending: Emergency Medicine | Admitting: Emergency Medicine

## 2014-05-23 DIAGNOSIS — M549 Dorsalgia, unspecified: Secondary | ICD-10-CM

## 2014-05-23 DIAGNOSIS — R5381 Other malaise: Secondary | ICD-10-CM | POA: Insufficient documentation

## 2014-05-23 DIAGNOSIS — Z791 Long term (current) use of non-steroidal anti-inflammatories (NSAID): Secondary | ICD-10-CM | POA: Insufficient documentation

## 2014-05-23 DIAGNOSIS — G8929 Other chronic pain: Secondary | ICD-10-CM | POA: Insufficient documentation

## 2014-05-23 DIAGNOSIS — Z7982 Long term (current) use of aspirin: Secondary | ICD-10-CM | POA: Insufficient documentation

## 2014-05-23 DIAGNOSIS — Z792 Long term (current) use of antibiotics: Secondary | ICD-10-CM | POA: Insufficient documentation

## 2014-05-23 DIAGNOSIS — R5383 Other fatigue: Secondary | ICD-10-CM

## 2014-05-23 DIAGNOSIS — I10 Essential (primary) hypertension: Secondary | ICD-10-CM | POA: Insufficient documentation

## 2014-05-23 DIAGNOSIS — M545 Low back pain, unspecified: Secondary | ICD-10-CM | POA: Insufficient documentation

## 2014-05-23 DIAGNOSIS — F17200 Nicotine dependence, unspecified, uncomplicated: Secondary | ICD-10-CM | POA: Insufficient documentation

## 2014-05-23 DIAGNOSIS — Z79899 Other long term (current) drug therapy: Secondary | ICD-10-CM | POA: Insufficient documentation

## 2014-05-23 DIAGNOSIS — IMO0002 Reserved for concepts with insufficient information to code with codable children: Secondary | ICD-10-CM | POA: Insufficient documentation

## 2014-05-23 DIAGNOSIS — Z8673 Personal history of transient ischemic attack (TIA), and cerebral infarction without residual deficits: Secondary | ICD-10-CM | POA: Insufficient documentation

## 2014-05-23 DIAGNOSIS — Z880 Allergy status to penicillin: Secondary | ICD-10-CM | POA: Insufficient documentation

## 2014-05-23 MED ORDER — DICLOFENAC SODIUM 75 MG PO TBEC: 75.0000 mg | DELAYED_RELEASE_TABLET | Freq: Two times a day (BID) | ORAL | Status: DC

## 2014-05-23 MED ORDER — DEXAMETHASONE 4 MG PO TABS: ORAL_TABLET | ORAL | Status: DC

## 2014-05-23 MED ORDER — CYCLOBENZAPRINE HCL 10 MG PO TABS: 10.0000 mg | ORAL_TABLET | Freq: Three times a day (TID) | ORAL | Status: DC

## 2014-05-23 MED ORDER — ONDANSETRON HCL 4 MG PO TABS
4.0000 mg | ORAL_TABLET | Freq: Once | ORAL | Status: AC
Start: 2014-05-23 — End: 2014-05-23
  Administered 2014-05-23: 4 mg via ORAL
  Filled 2014-05-23: qty 1

## 2014-05-23 MED ORDER — KETOROLAC TROMETHAMINE 60 MG/2ML IM SOLN
60.0000 mg | Freq: Once | INTRAMUSCULAR | Status: AC
  Administered 2014-05-23: 60 mg via INTRAMUSCULAR
  Filled 2014-05-23: qty 2

## 2014-05-23 MED ORDER — DEXAMETHASONE SODIUM PHOSPHATE 4 MG/ML IJ SOLN
8.0000 mg | Freq: Once | INTRAMUSCULAR | Status: AC
Start: 2014-05-23 — End: 2014-05-23
  Administered 2014-05-23: 8 mg via INTRAMUSCULAR
  Filled 2014-05-23: qty 2

## 2014-05-23 NOTE — ED Provider Notes (Signed)
Medical screening examination/treatment/procedure(s) were performed by non-physician practitioner and as supervising physician I was immediately available for consultation/collaboration.     Geoffery Lyons, MD 05/23/14 2312

## 2014-05-23 NOTE — ED Provider Notes (Signed)
CSN: [id number]     Arrival date & time 05/23/14  1955 History   First MD Initiated Contact with Patient 05/23/14 2037     Chief Complaint  Patient presents with  . Back Pain     (Consider location/radiation/quality/duration/timing/severity/associated sxs/prior Treatment) HPI Comments: Patient is a 53 year old male who presents to the emergency department with complaint of back pain. The patient states he has had back pain for several years, but the HDL the last 5 days he states he cannot bend over to put on his shoes or socks. He has pain with walking. The patient denies any loss of bowel or bladder function. He's not had any recent falls. It is of note that he's had a stroke in the pass out. There's been no operations on the back.  Patient is a 53 y.o. male presenting with back pain. The history is provided by the patient.  Back Pain Associated symptoms: weakness   Associated symptoms: no abdominal pain, no chest pain and no dysuria     Past Medical History  Diagnosis Date  . Stroke   . Hypertension   . Chronic pain    History reviewed. No pertinent past surgical history. Family History  Problem Relation Age of Onset  . Stroke Father   . Heart failure Father    History  Substance Use Topics  . Smoking status: Current Every Day Smoker -- 0.50 packs/day for 27 years    Types: Cigarettes  . Smokeless tobacco: Never Used  . Alcohol Use: No    Review of Systems  Constitutional: Negative for activity change.       All ROS Neg except as noted in HPI  HENT: Negative for nosebleeds.   Eyes: Negative for photophobia and discharge.  Respiratory: Negative for cough, shortness of breath and wheezing.   Cardiovascular: Negative for chest pain and palpitations.  Gastrointestinal: Negative for abdominal pain and blood in stool.  Genitourinary: Negative for dysuria, frequency and hematuria.  Musculoskeletal: Positive for back pain. Negative for arthralgias and neck pain.  Skin:  Negative.   Neurological: Positive for weakness. Negative for dizziness, seizures and speech difficulty.  Psychiatric/Behavioral: Negative for hallucinations and confusion.      Allergies  Codeine and Penicillins  Home Medications   Prior to Admission medications   Medication Sig Start Date End Date Taking? Authorizing Provider  albuterol (PROVENTIL HFA;VENTOLIN HFA) 108 (90 BASE) MCG/ACT inhaler Inhale 2 puffs into the lungs every 6 (six) hours as needed. For shortness of breath    Historical Provider, MD  alprazolam Prudy Feeler(XANAX) 2 MG tablet Take 2 mg by mouth 4 (four) times daily.    Historical Provider, MD  aspirin EC 81 MG tablet Take 81 mg by mouth daily.      Historical Provider, MD  clindamycin (CLEOCIN) 150 MG capsule Take 1 capsule (150 mg total) by mouth 3 (three) times daily. 02/18/14   Kathie DikeHobson M Brainard Highfill, PA-C  cyclobenzaprine (FLEXERIL) 10 MG tablet Take 10 mg by mouth 3 (three) times daily as needed for muscle spasms.    Historical Provider, MD  diphenhydrAMINE (BENADRYL) 25 mg capsule Take 1 capsule (25 mg total) by mouth every 6 (six) hours as needed for itching. 10/04/13   Sunnie NielsenBrian Opitz, MD  famotidine (PEPCID) 20 MG tablet Take 1 tablet (20 mg total) by mouth 2 (two) times daily. 10/04/13   Sunnie NielsenBrian Opitz, MD  hydrOXYzine (ATARAX/VISTARIL) 25 MG tablet Take 1 tablet (25 mg total) by mouth every 6 (six) hours. 12/15/13  Kathie Dike, PA-C  ibuprofen (ADVIL,MOTRIN) 800 MG tablet Take 1 tablet (800 mg total) by mouth 3 (three) times daily. 02/18/14   Kathie Dike, PA-C  predniSONE (DELTASONE) 20 MG tablet Take 3 tablets (60 mg total) by mouth daily. 10/04/13   Sunnie Nielsen, MD  traMADol Janean Sark) 50 MG tablet 1 or 2 po q6h prn pain 02/18/14   Kathie Dike, PA-C   BP 124/76  Pulse 86  Temp(Src) 98.1 F (36.7 C) (Oral)  Resp 20  Ht 5\' 9"  (1.753 m)  Wt 200 lb (90.719 kg)  BMI 29.52 kg/m2  SpO2 99% Physical Exam  Nursing note and vitals reviewed. Constitutional: He is oriented  to person, place, and time. He appears well-developed and well-nourished.  Non-toxic appearance.  HENT:  Head: Normocephalic.  Right Ear: Tympanic membrane and external ear normal.  Left Ear: Tympanic membrane and external ear normal.  Eyes: EOM and lids are normal. Pupils are equal, round, and reactive to light.  Neck: Normal range of motion. Neck supple. Carotid bruit is not present.  Cardiovascular: Normal rate, regular rhythm, normal heart sounds, intact distal pulses and normal pulses.   Pulmonary/Chest: Breath sounds normal. No respiratory distress.  Abdominal: Soft. Bowel sounds are normal. There is no tenderness. There is no guarding.  Musculoskeletal:       Lumbar back: He exhibits decreased range of motion, tenderness and pain.  Lymphadenopathy:       Head (right side): No submandibular adenopathy present.       Head (left side): No submandibular adenopathy present.    He has no cervical adenopathy.  Neurological: He is alert and oriented to person, place, and time. No cranial nerve deficit or sensory deficit.  There is some mild to moderate weakness of the right upper and lower extremity. This is not new per the patient, and is related to previous stroke.  Skin: Skin is warm and dry.  Psychiatric: He has a normal mood and affect. His speech is normal.    ED Course  Procedures (including critical care time) Labs Review Labs Reviewed - No data to display  Imaging Review No results found.   EKG Interpretation None      MDM Examination is consistent with an exacerbation of the previously diagnosed degenerative disc disease of the lower back. No gross neurologic deficit appreciated.  Gait is slow but steady. No evidence of foot drop noted.  Patient will be treated with diclofenac, Decadron, and Flexeril. Patient advised to see his primary physician for additional evaluation of his back.    Final diagnoses:  None    **I have reviewed nursing notes, vital signs, and  all appropriate lab and imaging results for this patient.*   and a  Kathie Dike, PA-C 05/23/14 2124

## 2014-05-23 NOTE — Discharge Instructions (Signed)
Heating pad to your lower back maybe helpful. Please use medications as directed. Flexeril may cause drowsiness, please use with caution. Please see your primary physician for additional evaluation concerning the advancing pain of your lower back. Back Pain, Adult Back pain is very common. The pain often gets better over time. The cause of back pain is usually not dangerous. Most people can learn to manage their back pain on their own.  HOME CARE   Stay active. Start with short walks on flat ground if you can. Try to walk farther each day.  Do not sit, drive, or stand in one place for more than 30 minutes. Do not stay in bed.  Do not avoid exercise or work. Activity can help your back heal faster.  Be careful when you bend or lift an object. Bend at your knees, keep the object close to you, and do not twist.  Sleep on a firm mattress. Lie on your side, and bend your knees. If you lie on your back, put a pillow under your knees.  Only take medicines as told by your doctor.  Put ice on the injured area.  Put ice in a plastic bag.  Place a towel between your skin and the bag.  Leave the ice on for 15-20 minutes, 03-04 times a day for the first 2 to 3 days. After that, you can switch between ice and heat packs.  Ask your doctor about back exercises or massage.  Avoid feeling anxious or stressed. Find good ways to deal with stress, such as exercise. GET HELP RIGHT AWAY IF:   Your pain does not go away with rest or medicine.  Your pain does not go away in 1 week.  You have new problems.  You do not feel well.  The pain spreads into your legs.  You cannot control when you poop (bowel movement) or pee (urinate).  Your arms or legs feel weak or lose feeling (numbness).  You feel sick to your stomach (nauseous) or throw up (vomit).  You have belly (abdominal) pain.  You feel like you may pass out (faint). MAKE SURE YOU:   Understand these instructions.  Will watch your  condition.  Will get help right away if you are not doing well or get worse. Document Released: 05/26/2008 Document Revised: 03/01/2012 Document Reviewed: 04/28/2011 [facility] Patient Information 2014 [place], [place].

## 2014-05-23 NOTE — ED Notes (Addendum)
Low back pain for 1 week, hx of back pain since 1990's No  Recent injury

## 2014-06-26 ENCOUNTER — Encounter (HOSPITAL_COMMUNITY): Payer: Self-pay | Admitting: Emergency Medicine

## 2014-06-26 ENCOUNTER — Emergency Department (HOSPITAL_COMMUNITY)
Admission: EM | Admit: 2014-06-26 | Discharge: 2014-06-26 | Disposition: A | Discharge status: Home / Self Care | Payer: Self-pay | Attending: Emergency Medicine | Admitting: Emergency Medicine

## 2014-06-26 DIAGNOSIS — K089 Disorder of teeth and supporting structures, unspecified: Secondary | ICD-10-CM | POA: Insufficient documentation

## 2014-06-26 NOTE — ED Notes (Signed)
No answer

## 2014-06-26 NOTE — ED Notes (Signed)
Attempted to place pt in room x2 and he was not in waiting room or bathroom.

## 2014-06-26 NOTE — ED Notes (Signed)
Attempted to place pt in room but he was not in the waiting room or bathroom.

## 2014-06-26 NOTE — ED Notes (Signed)
Dental pain for 2 days.  Says he has been taking clindamycin  That was left over from previous dental problem

## 2014-07-25 ENCOUNTER — Emergency Department (HOSPITAL_COMMUNITY)
Admission: EM | Admit: 2014-07-25 | Discharge: 2014-07-25 | Disposition: A | Discharge status: Home / Self Care | Payer: Self-pay | Attending: Emergency Medicine | Admitting: Emergency Medicine

## 2014-07-25 ENCOUNTER — Encounter (HOSPITAL_COMMUNITY): Payer: Self-pay | Admitting: Emergency Medicine

## 2014-07-25 DIAGNOSIS — Z880 Allergy status to penicillin: Secondary | ICD-10-CM | POA: Insufficient documentation

## 2014-07-25 DIAGNOSIS — IMO0002 Reserved for concepts with insufficient information to code with codable children: Secondary | ICD-10-CM | POA: Insufficient documentation

## 2014-07-25 DIAGNOSIS — G8929 Other chronic pain: Secondary | ICD-10-CM | POA: Insufficient documentation

## 2014-07-25 DIAGNOSIS — Z792 Long term (current) use of antibiotics: Secondary | ICD-10-CM | POA: Insufficient documentation

## 2014-07-25 DIAGNOSIS — Z8673 Personal history of transient ischemic attack (TIA), and cerebral infarction without residual deficits: Secondary | ICD-10-CM | POA: Insufficient documentation

## 2014-07-25 DIAGNOSIS — S335XXA Sprain of ligaments of lumbar spine, initial encounter: Secondary | ICD-10-CM | POA: Insufficient documentation

## 2014-07-25 DIAGNOSIS — Z79899 Other long term (current) drug therapy: Secondary | ICD-10-CM | POA: Insufficient documentation

## 2014-07-25 DIAGNOSIS — Y9389 Activity, other specified: Secondary | ICD-10-CM | POA: Insufficient documentation

## 2014-07-25 DIAGNOSIS — I10 Essential (primary) hypertension: Secondary | ICD-10-CM | POA: Insufficient documentation

## 2014-07-25 DIAGNOSIS — S39012A Strain of muscle, fascia and tendon of lower back, initial encounter: Secondary | ICD-10-CM

## 2014-07-25 DIAGNOSIS — F17200 Nicotine dependence, unspecified, uncomplicated: Secondary | ICD-10-CM | POA: Insufficient documentation

## 2014-07-25 DIAGNOSIS — Z791 Long term (current) use of non-steroidal anti-inflammatories (NSAID): Secondary | ICD-10-CM | POA: Insufficient documentation

## 2014-07-25 DIAGNOSIS — W172XXA Fall into hole, initial encounter: Secondary | ICD-10-CM | POA: Insufficient documentation

## 2014-07-25 DIAGNOSIS — Y929 Unspecified place or not applicable: Secondary | ICD-10-CM | POA: Insufficient documentation

## 2014-07-25 MED ORDER — CYCLOBENZAPRINE HCL 10 MG PO TABS: 10.0000 mg | ORAL_TABLET | Freq: Three times a day (TID) | ORAL | Status: DC | PRN

## 2014-07-25 MED ORDER — DICLOFENAC SODIUM 75 MG PO TBEC: 75.0000 mg | DELAYED_RELEASE_TABLET | Freq: Two times a day (BID) | ORAL | Status: DC

## 2014-07-25 MED ORDER — DEXAMETHASONE SODIUM PHOSPHATE 4 MG/ML IJ SOLN
8.0000 mg | Freq: Once | INTRAMUSCULAR | Status: DC
  Filled 2014-07-25: qty 2

## 2014-07-25 MED ORDER — ONDANSETRON 4 MG PO TBDP
4.0000 mg | ORAL_TABLET | Freq: Once | ORAL | Status: DC
  Filled 2014-07-25: qty 1

## 2014-07-25 MED ORDER — KETOROLAC TROMETHAMINE 60 MG/2ML IM SOLN
60.0000 mg | Freq: Once | INTRAMUSCULAR | Status: DC
  Filled 2014-07-25: qty 2

## 2014-07-25 NOTE — ED Notes (Signed)
Stepped in a hole yesterday.  C/o lower back pain.

## 2014-07-25 NOTE — ED Notes (Signed)
In to medicate pt. When pt was told what he was getting he stated " I'm hurting mam but I think I will pass on them shots". Also, stated he did not want the zofran. EDPA informed

## 2014-07-25 NOTE — Discharge Instructions (Signed)

## 2014-07-25 NOTE — ED Provider Notes (Signed)
CSN: [id number]     Arrival date & time 07/25/14  4098 History   First MD Initiated Contact with Patient 07/25/14 0932     Chief Complaint  Patient presents with  . Back Pain   CONLEY DELISLE is a 53 y.o. male who presents to the Emergency Department complaining of lower back pain for one day after stepping in a hole and twisting his back.  He describes a sharp pain to his mid lower back that radiates into his right leg.  He denies numbness or weakness of the LE, abdominal pain  (Consider location/radiation/quality/duration/timing/severity/associated sxs/prior Treatment) Patient is a 53 y.o. male presenting with back pain. The history is provided by the patient.  Back Pain Location:  Lumbar spine Quality:  Aching and shooting Radiates to:  R knee and R posterior upper leg Pain severity:  Moderate Pain is:  Same all the time Onset quality:  Sudden Duration:  1 day Timing:  Constant Progression:  Unchanged Chronicity:  Recurrent Context: recent injury   Relieved by:  Nothing Worsened by:  Bending, twisting and standing Ineffective treatments:  None tried Associated symptoms: leg pain   Associated symptoms: no abdominal pain, no abdominal swelling, no bladder incontinence, no bowel incontinence, no chest pain, no dysuria, no fever, no headaches, no numbness, no paresthesias, no pelvic pain, no perianal numbness, no tingling and no weakness     Past Medical History  Diagnosis Date  . Stroke   . Hypertension   . Chronic pain    History reviewed. No pertinent past surgical history. Family History  Problem Relation Age of Onset  . Stroke Father   . Heart failure Father    History  Substance Use Topics  . Smoking status: Current Every Day Smoker -- 0.50 packs/day for 27 years    Types: Cigarettes  . Smokeless tobacco: Never Used  . Alcohol Use: No    Review of Systems  Constitutional: Negative for fever.  Respiratory: Negative for shortness of breath.   Cardiovascular:  Negative for chest pain.  Gastrointestinal: Negative for vomiting, abdominal pain, constipation and bowel incontinence.  Genitourinary: Negative for bladder incontinence, dysuria, hematuria, flank pain, decreased urine volume, difficulty urinating and pelvic pain.       No perineal numbness or incontinence of urine or feces  Musculoskeletal: Positive for back pain. Negative for joint swelling.  Skin: Negative for rash.  Neurological: Negative for tingling, weakness, numbness, headaches and paresthesias.  All other systems reviewed and are negative.     Allergies  Codeine and Penicillins  Home Medications   Prior to Admission medications   Medication Sig Start Date End Date Taking? Authorizing Provider  albuterol (PROVENTIL HFA;VENTOLIN HFA) 108 (90 BASE) MCG/ACT inhaler Inhale 2 puffs into the lungs every 6 (six) hours as needed. For shortness of breath    Historical Provider, MD  alprazolam Prudy Feeler) 2 MG tablet Take 2 mg by mouth 4 (four) times daily.    Historical Provider, MD  aspirin EC 81 MG tablet Take 81 mg by mouth daily.      Historical Provider, MD  clindamycin (CLEOCIN) 150 MG capsule Take 1 capsule (150 mg total) by mouth 3 (three) times daily. 02/18/14   Kathie Dike, PA-C  cyclobenzaprine (FLEXERIL) 10 MG tablet Take 10 mg by mouth 3 (three) times daily as needed for muscle spasms.    Historical Provider, MD  cyclobenzaprine (FLEXERIL) 10 MG tablet Take 1 tablet (10 mg total) by mouth 3 (three) times daily. 05/23/14  Kathie DikeHobson M Bryant, PA-C  cyclobenzaprine (FLEXERIL) 10 MG tablet Take 1 tablet (10 mg total) by mouth 3 (three) times daily as needed. 07/25/14   Jalecia Leon L. Branda Chaudhary, PA-C  dexamethasone (DECADRON) 4 MG tablet 1 po bid with food 05/23/14   Kathie DikeHobson M Bryant, PA-C  diclofenac (VOLTAREN) 75 MG EC tablet Take 1 tablet (75 mg total) by mouth 2 (two) times daily. 05/23/14   Kathie DikeHobson M Bryant, PA-C  diclofenac (VOLTAREN) 75 MG EC tablet Take 1 tablet (75 mg total) by mouth 2 (two)  times daily. Take with food 07/25/14   Suleiman Finigan L. Costa Jha, PA-C  diphenhydrAMINE (BENADRYL) 25 mg capsule Take 1 capsule (25 mg total) by mouth every 6 (six) hours as needed for itching. 10/04/13   Sunnie NielsenBrian Opitz, MD  famotidine (PEPCID) 20 MG tablet Take 1 tablet (20 mg total) by mouth 2 (two) times daily. 10/04/13   Sunnie NielsenBrian Opitz, MD  hydrOXYzine (ATARAX/VISTARIL) 25 MG tablet Take 1 tablet (25 mg total) by mouth every 6 (six) hours. 12/15/13   Kathie DikeHobson M Bryant, PA-C  ibuprofen (ADVIL,MOTRIN) 800 MG tablet Take 1 tablet (800 mg total) by mouth 3 (three) times daily. 02/18/14   Kathie DikeHobson M Bryant, PA-C  predniSONE (DELTASONE) 20 MG tablet Take 3 tablets (60 mg total) by mouth daily. 10/04/13   Sunnie NielsenBrian Opitz, MD  traMADol Janean Sark(ULTRAM) 50 MG tablet 1 or 2 po q6h prn pain 02/18/14   Kathie DikeHobson M Bryant, PA-C   BP 117/82  Pulse 88  Temp(Src) 98.2 F (36.8 C) (Oral)  Resp 18  Ht 5\' 9"  (1.753 m)  Wt 188 lb (85.276 kg)  BMI 27.75 kg/m2  SpO2 98% Physical Exam  Nursing note and vitals reviewed. Constitutional: He is oriented to person, place, and time. He appears well-developed and well-nourished. No distress.  HENT:  Head: Normocephalic and atraumatic.  Neck: Normal range of motion. Neck supple.  Cardiovascular: Normal rate, regular rhythm, normal heart sounds and intact distal pulses.   No murmur heard. Pulmonary/Chest: Effort normal and breath sounds normal. No respiratory distress.  Abdominal: Soft. He exhibits no distension. There is no tenderness.  Musculoskeletal: He exhibits tenderness. He exhibits no edema.       Lumbar back: He exhibits tenderness and pain. He exhibits normal range of motion, no swelling, no deformity, no laceration and normal pulse.  Diffuse ttp of the lumbar paraspinal muscles and right SI joint space.  No spinal tenderness.  DP pulses are brisk and symmetrical.  Distal sensation intact.  Hip Flexors/Extensors are intact.  Pt has 5/5 strength against resistance of bilateral lower  extremities.     Neurological: He is alert and oriented to person, place, and time. He has normal strength. No sensory deficit. He exhibits normal muscle tone. Coordination and gait normal.  Reflex Scores:      Patellar reflexes are 2+ on the right side and 2+ on the left side.      Achilles reflexes are 2+ on the right side and 2+ on the left side. Skin: Skin is warm and dry. No rash noted.    ED Course  Procedures (including critical care time) Labs Review Labs Reviewed - No data to display  Imaging Review No results found.   EKG Interpretation None      MDM   Final diagnoses:  Lumbar strain, initial encounter    Pt is ambulatory, has hx of chronic low back pain.  Likely lumbar strain.  No concerning sx's for emergent neurological or infectious process.  Ambulates  with a steady gait.  Patient was offered IM toradol and decadron and po zofran, but he refused.  Rx's for flexeril and diclofenac written.  Pt agrees to close f/u with his PMD and appears stable for d/c    Kamara Allan L. Admiral Marcucci, PA-C 07/25/14 1058

## 2014-07-25 NOTE — ED Provider Notes (Signed)
Medical screening examination/treatment/procedure(s) were performed by non-physician practitioner and as supervising physician I was immediately available for consultation/collaboration.   EKG Interpretation None        Nareg Breighner L Naleigha Raimondi, MD 07/25/14 1304 

## 2014-08-02 ENCOUNTER — Encounter (HOSPITAL_COMMUNITY): Payer: Self-pay | Admitting: Emergency Medicine

## 2014-08-02 ENCOUNTER — Emergency Department (HOSPITAL_COMMUNITY): Payer: Self-pay

## 2014-08-02 ENCOUNTER — Emergency Department (HOSPITAL_COMMUNITY)
Admission: EM | Admit: 2014-08-02 | Discharge: 2014-08-02 | Disposition: A | Discharge status: Home / Self Care | Payer: Self-pay | Attending: Emergency Medicine | Admitting: Emergency Medicine

## 2014-08-02 DIAGNOSIS — IMO0002 Reserved for concepts with insufficient information to code with codable children: Secondary | ICD-10-CM | POA: Insufficient documentation

## 2014-08-02 DIAGNOSIS — F17200 Nicotine dependence, unspecified, uncomplicated: Secondary | ICD-10-CM | POA: Insufficient documentation

## 2014-08-02 DIAGNOSIS — H539 Unspecified visual disturbance: Secondary | ICD-10-CM | POA: Insufficient documentation

## 2014-08-02 DIAGNOSIS — I10 Essential (primary) hypertension: Secondary | ICD-10-CM | POA: Insufficient documentation

## 2014-08-02 DIAGNOSIS — Z792 Long term (current) use of antibiotics: Secondary | ICD-10-CM | POA: Insufficient documentation

## 2014-08-02 DIAGNOSIS — G8929 Other chronic pain: Secondary | ICD-10-CM | POA: Insufficient documentation

## 2014-08-02 DIAGNOSIS — R531 Weakness: Secondary | ICD-10-CM

## 2014-08-02 DIAGNOSIS — Z79899 Other long term (current) drug therapy: Secondary | ICD-10-CM | POA: Insufficient documentation

## 2014-08-02 DIAGNOSIS — R42 Dizziness and giddiness: Secondary | ICD-10-CM | POA: Insufficient documentation

## 2014-08-02 DIAGNOSIS — Z880 Allergy status to penicillin: Secondary | ICD-10-CM | POA: Insufficient documentation

## 2014-08-02 DIAGNOSIS — Z7982 Long term (current) use of aspirin: Secondary | ICD-10-CM | POA: Insufficient documentation

## 2014-08-02 DIAGNOSIS — R5383 Other fatigue: Principal | ICD-10-CM

## 2014-08-02 DIAGNOSIS — Z8673 Personal history of transient ischemic attack (TIA), and cerebral infarction without residual deficits: Secondary | ICD-10-CM | POA: Insufficient documentation

## 2014-08-02 DIAGNOSIS — Z791 Long term (current) use of non-steroidal anti-inflammatories (NSAID): Secondary | ICD-10-CM | POA: Insufficient documentation

## 2014-08-02 DIAGNOSIS — R5381 Other malaise: Secondary | ICD-10-CM | POA: Insufficient documentation

## 2014-08-02 LAB — COMPREHENSIVE METABOLIC PANEL
ALBUMIN: 4.2 g/dL (ref 3.5–5.2)
ALK PHOS: 59 U/L (ref 39–117)
ALT: 19 U/L (ref 0–53)
AST: 21 U/L (ref 0–37)
Anion gap: 13 (ref 5–15)
BILIRUBIN TOTAL: 0.8 mg/dL (ref 0.3–1.2)
BUN: 10 mg/dL (ref 6–23)
CO2: 25 meq/L (ref 19–32)
Calcium: 9.2 mg/dL (ref 8.4–10.5)
Chloride: 102 mEq/L (ref 96–112)
Creatinine, Ser: 0.74 mg/dL (ref 0.50–1.35)
GFR calc Af Amer: >90 mL/min (ref >90)
Glucose, Bld: 94 mg/dL (ref 70–99)
POTASSIUM: 4.5 meq/L (ref 3.7–5.3)
SODIUM: 140 meq/L (ref 137–147)
Total Protein: 7.2 g/dL (ref 6.0–8.3)

## 2014-08-02 LAB — CBC WITH DIFFERENTIAL/PLATELET
BASOS ABS: 0 10*3/uL (ref 0.0–0.1)
BASOS PCT: 1 % (ref 0–1)
Eosinophils Absolute: 0.3 10*3/uL (ref 0.0–0.7)
Eosinophils Relative: 4 % (ref 0–5)
HCT: 39 % (ref 39.0–52.0)
Hemoglobin: 12.9 g/dL — ABNORMAL LOW (ref 13.0–17.0)
LYMPHS PCT: 23 % (ref 12–46)
Lymphs Abs: 1.6 10*3/uL (ref 0.7–4.0)
MCH: 21.6 pg — ABNORMAL LOW (ref 26.0–34.0)
MCHC: 33.1 g/dL (ref 30.0–36.0)
MCV: 65.3 fL — ABNORMAL LOW (ref 78.0–100.0)
MONO ABS: 0.4 10*3/uL (ref 0.1–1.0)
Monocytes Relative: 5 % (ref 3–12)
NEUTROS ABS: 4.6 10*3/uL (ref 1.7–7.7)
NEUTROS PCT: 67 % (ref 43–77)
Platelets: 218 10*3/uL (ref 150–400)
RBC: 5.97 MIL/uL — ABNORMAL HIGH (ref 4.22–5.81)
RDW: 15.8 % — AB (ref 11.5–15.5)
WBC: 6.9 10*3/uL (ref 4.0–10.5)

## 2014-08-02 LAB — CBG MONITORING, ED: GLUCOSE-CAPILLARY: 92 mg/dL (ref 70–99)

## 2014-08-02 LAB — TROPONIN I

## 2014-08-02 MED ORDER — KETOROLAC TROMETHAMINE 30 MG/ML IJ SOLN
30.0000 mg | Freq: Once | INTRAMUSCULAR | Status: AC
  Administered 2014-08-02: 30 mg via INTRAVENOUS
  Filled 2014-08-02: qty 1

## 2014-08-02 MED ORDER — SODIUM CHLORIDE 0.9 % IV BOLUS (SEPSIS)
1000.0000 mL | Freq: Once | INTRAVENOUS | Status: AC
  Administered 2014-08-02: 1000 mL via INTRAVENOUS

## 2014-08-02 NOTE — Discharge Instructions (Signed)
Take one multivitamin a day.  Gary Harrington is a good multivitamin.  Try to eat 3 meals a day and drink plenty of fluids.

## 2014-08-02 NOTE — ED Notes (Signed)
Pt c/o dizziness x 2 days. States it is constant with blurred vision and seeing spots that started today. Denies pain.pt c/o sweating also. NAD. Pt offered w/c to walk back to room and pt refused politely. Pt very stable with ambulation. Slight diaphoresis noted.  Pt does states his right side feels weaker than left. No obvious weakness noted. Pt walked to ED

## 2014-08-02 NOTE — ED Notes (Signed)
In room. Beside monitor attached. VSS. NAD. Diaphoretic which he contributes to walking "couple of miles" to the hospital. Weaker on right side, which patient says is from old CVA. Pt reports a new or increased weakness.

## 2014-08-02 NOTE — ED Provider Notes (Signed)
CSN: [id number]     Arrival date & time 08/02/14  4098 History  This chart was scribed for Benny Lennert, MD by Elon Spanner, ED Scribe. This patient was seen in room APA09/APA09 and the patient's care was started at 7:47 AM.    Chief Complaint  Patient presents with  . Dizziness    Patient is a 53 y.o. male presenting with dizziness. The history is provided by the patient. No language interpreter was used.  Dizziness Severity:  Moderate Duration:  2 days Timing:  Constant Progression:  Unchanged Chronicity:  New Context: bending over   Relieved by:  Nothing Worsened by:  Nothing tried Ineffective treatments:  None tried Associated symptoms: no chest pain, no diarrhea, no headaches, no nausea and no vomiting     HPI Comments: Gary Harrington is a 53 y.o. male with a history of CVA and HTN who presents to the Emergency Department complaining of dizziness onset 2 days ago. Patient also reports "seeing spots".  Patient states he was putting his socks on this morning and "walked into the wall".  He has had similar, but less severe episodes of dizziness in the past.  Patient denies having any pain currently, nausea, vomiting, and abdominal pain.    Past Medical History  Diagnosis Date  . Stroke   . Hypertension   . Chronic pain    History reviewed. No pertinent past surgical history. Family History  Problem Relation Age of Onset  . Stroke Father   . Heart failure Father    History  Substance Use Topics  . Smoking status: Current Every Day Smoker -- 0.50 packs/day for 27 years    Types: Cigarettes  . Smokeless tobacco: Never Used  . Alcohol Use: No    Review of Systems  Constitutional: Negative for appetite change and fatigue.  HENT: Negative for congestion, ear discharge and sinus pressure.   Eyes: Positive for visual disturbance. Negative for discharge.  Respiratory: Negative for cough.   Cardiovascular: Negative for chest pain.  Gastrointestinal: Negative for  nausea, vomiting, abdominal pain and diarrhea.  Genitourinary: Negative for frequency and hematuria.  Musculoskeletal: Negative for back pain.  Skin: Negative for rash.  Neurological: Positive for dizziness. Negative for seizures and headaches.  Psychiatric/Behavioral: Negative for hallucinations.      Allergies  Codeine and Penicillins  Home Medications   Prior to Admission medications   Medication Sig Start Date End Date Taking? Authorizing Provider  albuterol (PROVENTIL HFA;VENTOLIN HFA) 108 (90 BASE) MCG/ACT inhaler Inhale 2 puffs into the lungs every 6 (six) hours as needed. For shortness of breath    Historical Provider, MD  alprazolam Prudy Feeler) 2 MG tablet Take 2 mg by mouth 4 (four) times daily.    Historical Provider, MD  aspirin EC 81 MG tablet Take 81 mg by mouth daily.      Historical Provider, MD  clindamycin (CLEOCIN) 150 MG capsule Take 1 capsule (150 mg total) by mouth 3 (three) times daily. 02/18/14   Kathie Dike, PA-C  cyclobenzaprine (FLEXERIL) 10 MG tablet Take 10 mg by mouth 3 (three) times daily as needed for muscle spasms.    Historical Provider, MD  cyclobenzaprine (FLEXERIL) 10 MG tablet Take 1 tablet (10 mg total) by mouth 3 (three) times daily. 05/23/14   Kathie Dike, PA-C  cyclobenzaprine (FLEXERIL) 10 MG tablet Take 1 tablet (10 mg total) by mouth 3 (three) times daily as needed. 07/25/14   Tammy L. Triplett, PA-C  dexamethasone (DECADRON) 4  MG tablet 1 po bid with food 05/23/14   Kathie DikeHobson M Bryant, PA-C  diclofenac (VOLTAREN) 75 MG EC tablet Take 1 tablet (75 mg total) by mouth 2 (two) times daily. 05/23/14   Kathie DikeHobson M Bryant, PA-C  diclofenac (VOLTAREN) 75 MG EC tablet Take 1 tablet (75 mg total) by mouth 2 (two) times daily. Take with food 07/25/14   Tammy L. Triplett, PA-C  diphenhydrAMINE (BENADRYL) 25 mg capsule Take 1 capsule (25 mg total) by mouth every 6 (six) hours as needed for itching. 10/04/13   Sunnie NielsenBrian Opitz, MD  famotidine (PEPCID) 20 MG tablet Take 1  tablet (20 mg total) by mouth 2 (two) times daily. 10/04/13   Sunnie NielsenBrian Opitz, MD  hydrOXYzine (ATARAX/VISTARIL) 25 MG tablet Take 1 tablet (25 mg total) by mouth every 6 (six) hours. 12/15/13   Kathie DikeHobson M Bryant, PA-C  ibuprofen (ADVIL,MOTRIN) 800 MG tablet Take 1 tablet (800 mg total) by mouth 3 (three) times daily. 02/18/14   Kathie DikeHobson M Bryant, PA-C  predniSONE (DELTASONE) 20 MG tablet Take 3 tablets (60 mg total) by mouth daily. 10/04/13   Sunnie NielsenBrian Opitz, MD  traMADol Janean Sark(ULTRAM) 50 MG tablet 1 or 2 po q6h prn pain 02/18/14   Kathie DikeHobson M Bryant, PA-C   BP 112/88  Pulse 79  Temp(Src) 97.9 F (36.6 C) (Oral)  Resp 12  Ht 5\' 9"  (1.753 m)  Wt 188 lb (85.276 kg)  BMI 27.75 kg/m2  SpO2 98% Physical Exam  Nursing note and vitals reviewed. Constitutional: He is oriented to person, place, and time. He appears well-developed.  Cool and clammy.   HENT:  Head: Normocephalic.  Eyes: Conjunctivae and EOM are normal. No scleral icterus.  Neck: Neck supple. No thyromegaly present.  Cardiovascular: Normal rate and regular rhythm.  Exam reveals no gallop and no friction rub.   No murmur heard. Pulmonary/Chest: No stridor. He has no wheezes. He has no rales. He exhibits no tenderness.  Abdominal: He exhibits no distension. There is no tenderness. There is no rebound.  Musculoskeletal: Normal range of motion. He exhibits no edema.  Lymphadenopathy:    He has no cervical adenopathy.  Neurological: He is oriented to person, place, and time. He exhibits normal muscle tone. Coordination normal.  Skin: No rash noted. No erythema.  Psychiatric: He has a normal mood and affect. His behavior is normal.    ED Course  Procedures (including critical care time)  DIAGNOSTIC STUDIES: Oxygen Saturation is 98% on RA, normal by my interpretation.    COORDINATION OF CARE:  7:55 AM Discussed plan with pt at bedside.  Patient acknowledges and agrees with plan.    Labs Review Labs Reviewed  CBC WITH DIFFERENTIAL - Abnormal;  Notable for the following:    RBC 5.97 (*)    Hemoglobin 12.9 (*)    MCV 65.3 (*)    MCH 21.6 (*)    RDW 15.8 (*)    All other components within normal limits  COMPREHENSIVE METABOLIC PANEL  TROPONIN I  CBG MONITORING, ED    Imaging Review Ct Head Wo Contrast  08/02/2014   CLINICAL DATA:  Dizziness and weakness.  No known injury.  EXAM: CT HEAD WITHOUT CONTRAST  TECHNIQUE: Contiguous axial images were obtained from the base of the skull through the vertex without intravenous contrast.  COMPARISON:  Head CT 03/25/2013.  FINDINGS: There is no evidence of acute intracranial hemorrhage, mass lesion, brain edema or extra-axial fluid collection. The ventricles and subarachnoid spaces are appropriately sized for age. There  is no CT evidence of acute cortical infarction.  There is a minor mucosal thickening within the ethmoid air cells. The additional visualized paranasal sinuses, mastoid air cells and middle ears are clear. The calvarium is intact.  IMPRESSION: Stable appearance of the brain without acute intracranial findings. Mild ethmoid sinus mucosal thickening.   Electronically Signed   By: Roxy Horseman M.D.   On: 08/02/2014 08:30   Dg Chest Portable 1 View  08/02/2014   CLINICAL DATA:  Dizziness.  Smoking history.  Hypertension.  EXAM: PORTABLE CHEST - 1 VIEW  COMPARISON:  07/13/2009  FINDINGS: Heart size is normal. Mediastinal shadows are normal. The lungs are clear. The vascularity is normal. No effusions. Old healed rib fractures again seen on the left.  IMPRESSION: No active cardiopulmonary disease. Old healed rib fractures on the left.   Electronically Signed   By: Paulina Fusi M.D.   On: 08/02/2014 08:07     EKG Interpretation None      MDM   Final diagnoses:  None    Weakness with normal studies,   Pt to follow up with pcp and eat regularly and start multivitiamin   The chart was scribed for me under my direct supervision.  I personally performed the history, physical, and  medical decision making and all procedures in the evaluation of this patient.Benny Lennert, MD 08/02/14 [phone]

## 2014-08-02 NOTE — ED Notes (Signed)
Patient stated he had slight dizziness upon sitting and standing but it did clear up.  Patient had no problem standing on his own without assistance.

## 2014-08-23 ENCOUNTER — Emergency Department (HOSPITAL_COMMUNITY)
Admission: EM | Admit: 2014-08-23 | Discharge: 2014-08-23 | Disposition: A | Discharge status: Home / Self Care | Payer: Self-pay | Attending: Emergency Medicine | Admitting: Emergency Medicine

## 2014-08-23 ENCOUNTER — Encounter (HOSPITAL_COMMUNITY): Payer: Self-pay | Admitting: Emergency Medicine

## 2014-08-23 DIAGNOSIS — G8929 Other chronic pain: Secondary | ICD-10-CM | POA: Insufficient documentation

## 2014-08-23 DIAGNOSIS — M549 Dorsalgia, unspecified: Secondary | ICD-10-CM | POA: Insufficient documentation

## 2014-08-23 DIAGNOSIS — Z7982 Long term (current) use of aspirin: Secondary | ICD-10-CM | POA: Insufficient documentation

## 2014-08-23 DIAGNOSIS — I10 Essential (primary) hypertension: Secondary | ICD-10-CM | POA: Insufficient documentation

## 2014-08-23 DIAGNOSIS — Z760 Encounter for issue of repeat prescription: Secondary | ICD-10-CM | POA: Insufficient documentation

## 2014-08-23 DIAGNOSIS — F17200 Nicotine dependence, unspecified, uncomplicated: Secondary | ICD-10-CM | POA: Insufficient documentation

## 2014-08-23 DIAGNOSIS — Z792 Long term (current) use of antibiotics: Secondary | ICD-10-CM | POA: Insufficient documentation

## 2014-08-23 DIAGNOSIS — Z880 Allergy status to penicillin: Secondary | ICD-10-CM | POA: Insufficient documentation

## 2014-08-23 DIAGNOSIS — F411 Generalized anxiety disorder: Secondary | ICD-10-CM | POA: Insufficient documentation

## 2014-08-23 DIAGNOSIS — Z79899 Other long term (current) drug therapy: Secondary | ICD-10-CM | POA: Insufficient documentation

## 2014-08-23 DIAGNOSIS — Z8673 Personal history of transient ischemic attack (TIA), and cerebral infarction without residual deficits: Secondary | ICD-10-CM | POA: Insufficient documentation

## 2014-08-23 MED ORDER — ALPRAZOLAM 0.5 MG PO TABS
1.0000 mg | ORAL_TABLET | Freq: Once | ORAL | Status: AC
  Administered 2014-08-23: 1 mg via ORAL
  Filled 2014-08-23: qty 2

## 2014-08-23 NOTE — ED Provider Notes (Signed)
CSN: [id number]     Arrival date & time 08/23/14  1305 History   First MD Initiated Contact with Patient 08/23/14 1415     Chief Complaint  Patient presents with  . Medication Refill     (Consider location/radiation/quality/duration/timing/severity/associated sxs/prior Treatment) HPI Comments: The patient is a 53 year old male who presents to the emergency department with a complaint of" I'm out of my Xanax". Patient states that he has been on Xanax and he usually gets a month supply. He states that he has been out of the pills for the past 2 days, he is concerned about withdrawal. He states he feels very anxious after having not had the medicine for the past 2 days. The patient states that he called his primary doctor before coming to the emergency department and could not get an appointment time. He presents to the emergency department to see if he can get a prescription for his Xanax. Pt denies suicidal or homicidal ideas.  The history is provided by the patient.    Past Medical History  Diagnosis Date  . Stroke   . Hypertension   . Chronic pain    History reviewed. No pertinent past surgical history. Family History  Problem Relation Age of Onset  . Stroke Father   . Heart failure Father    History  Substance Use Topics  . Smoking status: Current Every Day Smoker -- 0.50 packs/day for 27 years    Types: Cigarettes  . Smokeless tobacco: Never Used  . Alcohol Use: No    Review of Systems  Constitutional: Negative for activity change.       All ROS Neg except as noted in HPI  HENT: Negative for nosebleeds.   Eyes: Negative for photophobia and discharge.  Respiratory: Negative for cough, shortness of breath and wheezing.   Cardiovascular: Negative for chest pain and palpitations.  Gastrointestinal: Negative for abdominal pain and blood in stool.  Genitourinary: Negative for dysuria, frequency and hematuria.  Musculoskeletal: Positive for arthralgias and back pain. Negative  for neck pain.  Skin: Negative.   Neurological: Negative for dizziness, seizures and speech difficulty.  Psychiatric/Behavioral: Negative for hallucinations and confusion. The patient is nervous/anxious.       Allergies  Codeine and Penicillins  Home Medications   Prior to Admission medications   Medication Sig Start Date End Date Taking? Authorizing Provider  albuterol (PROVENTIL HFA;VENTOLIN HFA) 108 (90 BASE) MCG/ACT inhaler Inhale 2 puffs into the lungs every 6 (six) hours as needed. For shortness of breath    Historical Provider, MD  alprazolam Prudy Feeler) 2 MG tablet Take 2 mg by mouth 4 (four) times daily.    Historical Provider, MD  aspirin EC 81 MG tablet Take 81 mg by mouth daily.      Historical Provider, MD  clindamycin (CLEOCIN) 150 MG capsule Take 1 capsule (150 mg total) by mouth 3 (three) times daily. 02/18/14   Kathie Dike, PA-C  cyclobenzaprine (FLEXERIL) 10 MG tablet Take 10 mg by mouth 3 (three) times daily as needed for muscle spasms.    Historical Provider, MD  cyclobenzaprine (FLEXERIL) 10 MG tablet Take 1 tablet (10 mg total) by mouth 3 (three) times daily. 05/23/14   Kathie Dike, PA-C  cyclobenzaprine (FLEXERIL) 10 MG tablet Take 1 tablet (10 mg total) by mouth 3 (three) times daily as needed. 07/25/14   Tammy L. Triplett, PA-C  dexamethasone (DECADRON) 4 MG tablet 1 po bid with food 05/23/14   Kathie Dike, PA-C  diclofenac (VOLTAREN) 75 MG EC tablet Take 1 tablet (75 mg total) by mouth 2 (two) times daily. 05/23/14   Kathie Dike, PA-C  diclofenac (VOLTAREN) 75 MG EC tablet Take 1 tablet (75 mg total) by mouth 2 (two) times daily. Take with food 07/25/14   Tammy L. Triplett, PA-C  diphenhydrAMINE (BENADRYL) 25 mg capsule Take 1 capsule (25 mg total) by mouth every 6 (six) hours as needed for itching. 10/04/13   Sunnie Nielsen, MD  famotidine (PEPCID) 20 MG tablet Take 1 tablet (20 mg total) by mouth 2 (two) times daily. 10/04/13   Sunnie Nielsen, MD  hydrOXYzine  (ATARAX/VISTARIL) 25 MG tablet Take 1 tablet (25 mg total) by mouth every 6 (six) hours. 12/15/13   Kathie Dike, PA-C  ibuprofen (ADVIL,MOTRIN) 800 MG tablet Take 1 tablet (800 mg total) by mouth 3 (three) times daily. 02/18/14   Kathie Dike, PA-C  predniSONE (DELTASONE) 20 MG tablet Take 3 tablets (60 mg total) by mouth daily. 10/04/13   Sunnie Nielsen, MD  traMADol Janean Sark) 50 MG tablet 1 or 2 po q6h prn pain 02/18/14   Kathie Dike, PA-C   BP 105/64  Pulse 92  Temp(Src) 98.8 F (37.1 C) (Oral)  Resp 16  SpO2 98% Physical Exam  Nursing note and vitals reviewed. Constitutional: He is oriented to person, place, and time. He appears well-developed and well-nourished.  Non-toxic appearance.  HENT:  Head: Normocephalic.  Right Ear: Tympanic membrane and external ear normal.  Left Ear: Tympanic membrane and external ear normal.  Eyes: EOM and lids are normal. Pupils are equal, round, and reactive to light.  Neck: Normal range of motion. Neck supple. Carotid bruit is not present.  Cardiovascular: Normal rate, regular rhythm, normal heart sounds, intact distal pulses and normal pulses.   Pulmonary/Chest: Breath sounds normal. No respiratory distress.  Abdominal: Soft. Bowel sounds are normal. There is no tenderness. There is no guarding.  Musculoskeletal:       Lumbar back: He exhibits decreased range of motion and tenderness.  Lymphadenopathy:       Head (right side): No submandibular adenopathy present.       Head (left side): No submandibular adenopathy present.    He has no cervical adenopathy.  Neurological: He is alert and oriented to person, place, and time. He has normal strength. No cranial nerve deficit or sensory deficit.  Skin: Skin is warm and dry.  Psychiatric: His speech is normal. His mood appears anxious. He expresses no suicidal plans and no homicidal plans.    ED Course  Procedures (including critical care time) Labs Review Labs Reviewed - No data to  display  Imaging Review No results found.   EKG Interpretation None      MDM I spoke with Dr Martie Round office. Case discussed. They will see pt tomorrow at 2pm. Xanax  given to the patient for today.   Final diagnoses:  None    *I have reviewed nursing notes, vital signs, and all appropriate lab and imaging results for this patient.Kathie Dike, PA-C 08/23/14 1442

## 2014-08-23 NOTE — Discharge Instructions (Signed)
Dr Lacie Scotts will see you in the office at 2pm. Please take your medication bottle with you.

## 2014-08-23 NOTE — ED Provider Notes (Signed)
Medical screening examination/treatment/procedure(s) were performed by non-physician practitioner and as supervising physician I was immediately available for consultation/collaboration.   EKG Interpretation None        Kizzy Olafson L Helmut Hennon, MD 08/23/14 1537 

## 2014-08-23 NOTE — ED Notes (Signed)
nad noted prior to dc. Dc instructions reviewed with pt and explained. Pt voiced understanding. Ambulated out without difficulty.

## 2014-08-23 NOTE — ED Notes (Signed)
Pt states he has been without xanax x 2 days. States his PCP got in trouble and is unable to prescribe it anymore.

## 2014-09-07 ENCOUNTER — Emergency Department (HOSPITAL_COMMUNITY)
Admission: EM | Admit: 2014-09-07 | Discharge: 2014-09-07 | Disposition: A | Discharge status: Home / Self Care | Payer: Self-pay | Attending: Emergency Medicine | Admitting: Emergency Medicine

## 2014-09-07 ENCOUNTER — Encounter (HOSPITAL_COMMUNITY): Payer: Self-pay | Admitting: Emergency Medicine

## 2014-09-07 DIAGNOSIS — F17200 Nicotine dependence, unspecified, uncomplicated: Secondary | ICD-10-CM | POA: Insufficient documentation

## 2014-09-07 DIAGNOSIS — Z880 Allergy status to penicillin: Secondary | ICD-10-CM | POA: Insufficient documentation

## 2014-09-07 DIAGNOSIS — I10 Essential (primary) hypertension: Secondary | ICD-10-CM | POA: Insufficient documentation

## 2014-09-07 DIAGNOSIS — F419 Anxiety disorder, unspecified: Secondary | ICD-10-CM

## 2014-09-07 DIAGNOSIS — Z8673 Personal history of transient ischemic attack (TIA), and cerebral infarction without residual deficits: Secondary | ICD-10-CM | POA: Insufficient documentation

## 2014-09-07 DIAGNOSIS — Z760 Encounter for issue of repeat prescription: Secondary | ICD-10-CM | POA: Insufficient documentation

## 2014-09-07 DIAGNOSIS — Z7982 Long term (current) use of aspirin: Secondary | ICD-10-CM | POA: Insufficient documentation

## 2014-09-07 DIAGNOSIS — Z79899 Other long term (current) drug therapy: Secondary | ICD-10-CM | POA: Insufficient documentation

## 2014-09-07 DIAGNOSIS — R259 Unspecified abnormal involuntary movements: Secondary | ICD-10-CM | POA: Insufficient documentation

## 2014-09-07 DIAGNOSIS — G8929 Other chronic pain: Secondary | ICD-10-CM | POA: Insufficient documentation

## 2014-09-07 DIAGNOSIS — F411 Generalized anxiety disorder: Secondary | ICD-10-CM | POA: Insufficient documentation

## 2014-09-07 MED ORDER — HYDROXYZINE HCL 25 MG PO TABS: 25.0000 mg | ORAL_TABLET | Freq: Three times a day (TID) | ORAL | Status: DC | PRN

## 2014-09-07 NOTE — Discharge Instructions (Signed)
Atarax as needed with anxiety. Memory care physician followup for ongoing treatment for your anxiety. Panic Attacks Panic attacks are sudden, short-livedsurges of severe anxiety, fear, or discomfort. They may occur for no reason when you are relaxed, when you are anxious, or when you are sleeping. Panic attacks may occur for a number of reasons:   Healthy people occasionally have panic attacks in extreme, life-threatening situations, such as war or natural disasters. Normal anxiety is a protective mechanism of the body that helps [place] react to danger (fight or flight response).  Panic attacks are often seen with anxiety disorders, such as panic disorder, social anxiety disorder, generalized anxiety disorder, and phobias. Anxiety disorders cause excessive or uncontrollable anxiety. They may interfere with your relationships or other life activities.  Panic attacks are sometimes seen with other mental illnesses, such as depression and posttraumatic stress disorder.  Certain medical conditions, prescription medicines, and drugs of abuse can cause panic attacks. SYMPTOMS  Panic attacks start suddenly, peak within 20 minutes, and are accompanied by four or more of the following symptoms:  Pounding heart or fast heart rate (palpitations).  Sweating.  Trembling or shaking.  Shortness of breath or feeling smothered.  Feeling choked.  Chest pain or discomfort.  Nausea or strange feeling in your stomach.  Dizziness, light-headedness, or feeling like you will faint.  Chills or hot flushes.  Numbness or tingling in your lips or hands and feet.  Feeling that things are not real or feeling that you are not yourself.  Fear of losing control or going crazy.  Fear of dying. Some of these symptoms can mimic serious medical conditions. For example, you may think you are having a heart attack. Although panic attacks can be very scary, they are not life threatening. DIAGNOSIS  Panic attacks are  diagnosed through an assessment by your health care provider. Your health care provider will ask questions about your symptoms, such as where and when they occurred. Your health care provider will also ask about your medical history and use of alcohol and drugs, including prescription medicines. Your health care provider may order blood tests or other studies to rule out a serious medical condition. Your health care provider may refer you to a mental health professional for further evaluation. TREATMENT   Most healthy people who have one or two panic attacks in an extreme, life-threatening situation will not require treatment.  The treatment for panic attacks associated with anxiety disorders or other mental illness typically involves counseling with a mental health professional, medicine, or a combination of both. Your health care provider will help determine what treatment is best for you.  Panic attacks due to physical illness usually go away with treatment of the illness. If prescription medicine is causing panic attacks, talk with your health care provider about stopping the medicine, decreasing the dose, or substituting another medicine.  Panic attacks due to alcohol or drug abuse go away with abstinence. Some adults need professional help in order to stop drinking or using drugs. HOME CARE INSTRUCTIONS   Take all medicines as directed by your health care provider.   Schedule and attend follow-up visits as directed by your health care provider. It is important to keep all your appointments. SEEK MEDICAL CARE IF:  You are not able to take your medicines as prescribed.  Your symptoms do not improve or get worse. SEEK IMMEDIATE MEDICAL CARE IF:   You experience panic attack symptoms that are different than your usual symptoms.  You have serious  thoughts about hurting yourself or others.  You are taking medicine for panic attacks and have a serious side effect. MAKE SURE  YOU:  Understand these instructions.  Will watch your condition.  Will get help right away if you are not doing well or get worse. Document Released: 12/08/2005 Document Revised: 12/13/2013 Document Reviewed: 07/22/2013 [facility] Patient Information 2015 [place], [place]. This information is not intended to replace advice given to you by your health care provider. Make sure you discuss any questions you have with your health care provider.

## 2014-09-07 NOTE — ED Notes (Addendum)
Pt says he has been out of xanax 2 mg for 2 weeks. Says his doctor is no longer in practice. "I don't feel right, Ive been sweating"

## 2014-09-07 NOTE — ED Notes (Addendum)
Pt states he is here to get script for his Xanax. Pt states he went to his Dr but the NP he saw would not prescribe the meds. Pt explained & understands the need for a PCP.

## 2014-09-07 NOTE — ED Provider Notes (Signed)
CSN: [id number]     Arrival date & time 09/07/14  1924 History   First MD Initiated Contact with Patient 09/07/14 1946     No chief complaint on file.     HPI  Patient presents with a request for medication for anxiety. States she's been taking Xanax for "for years". Seen here on the second of this month. States he followed up with his physician. States that his physician "is no longer able to prescribe medicine". States that his physician lost his license for controlled substances. He states he is anxious but does not feel shaky has not had seizures.  Past Medical History  Diagnosis Date  . Stroke   . Hypertension   . Chronic pain    History reviewed. No pertinent past surgical history. Family History  Problem Relation Age of Onset  . Stroke Father   . Heart failure Father    History  Substance Use Topics  . Smoking status: Current Every Day Smoker -- 0.50 packs/day for 27 years    Types: Cigarettes  . Smokeless tobacco: Never Used  . Alcohol Use: No    Review of Systems  Constitutional: Negative for fever, chills, diaphoresis, appetite change and fatigue.  HENT: Negative for mouth sores, sore throat and trouble swallowing.   Eyes: Negative for visual disturbance.  Respiratory: Negative for cough, chest tightness, shortness of breath and wheezing.   Cardiovascular: Negative for chest pain.  Gastrointestinal: Negative for nausea, vomiting, abdominal pain, diarrhea and abdominal distention.  Endocrine: Negative for polydipsia, polyphagia and polyuria.  Genitourinary: Negative for dysuria, frequency and hematuria.  Musculoskeletal: Negative for gait problem.  Skin: Negative for color change, pallor and rash.  Neurological: Positive for tremors. Negative for dizziness, syncope, light-headedness and headaches.  Hematological: Does not bruise/bleed easily.  Psychiatric/Behavioral: Negative for behavioral problems and confusion. The patient is nervous/anxious.        Allergies  Codeine and Penicillins  Home Medications   Prior to Admission medications   Medication Sig Start Date End Date Taking? Authorizing Provider  albuterol (PROVENTIL HFA;VENTOLIN HFA) 108 (90 BASE) MCG/ACT inhaler Inhale 2 puffs into the lungs every 6 (six) hours as needed. For shortness of breath   Yes Historical Provider, MD  alprazolam Prudy Feeler) 2 MG tablet Take 2 mg by mouth 4 (four) times daily.   Yes Historical Provider, MD  aspirin EC 81 MG tablet Take 81 mg by mouth daily.     Yes Historical Provider, MD  cyclobenzaprine (FLEXERIL) 10 MG tablet Take 10 mg by mouth 3 (three) times daily as needed for muscle spasms.   Yes Historical Provider, MD  hydrOXYzine (ATARAX/VISTARIL) 25 MG tablet Take 1 tablet (25 mg total) by mouth every 8 (eight) hours as needed for anxiety. 09/07/14   Rolland Porter, MD   BP 109/74  Pulse 90  Temp(Src) 98.1 F (36.7 C) (Oral)  Resp 20  Ht  (1.753 m)  Wt 188 lb (85.276 kg)  BMI 27.75 kg/m2  SpO2 100% Physical Exam  Constitutional: He is oriented to person, place, and time. He appears well-developed and well-nourished. No distress.  HENT:  Head: Normocephalic.  Eyes: Conjunctivae are normal. Pupils are equal, round, and reactive to light. No scleral icterus.  Neck: Normal range of motion. Neck supple. No thyromegaly present.  Cardiovascular: Normal rate and regular rhythm.  Exam reveals no gallop and no friction rub.   No murmur heard. Pulmonary/Chest: Effort normal and breath sounds normal. No respiratory distress. He has no wheezes. He  has no rales.  Abdominal: Soft. Bowel sounds are normal. He exhibits no distension. There is no tenderness. There is no rebound.  Musculoskeletal: Normal range of motion.  Neurological: He is alert and oriented to person, place, and time.  Skin: Skin is warm and dry. No rash noted.  Psychiatric: His behavior is normal. His mood appears anxious.    ED Course  Procedures (including critical care  time) Labs Review Labs Reviewed - No data to display  Imaging Review No results found.   EKG Interpretation None      MDM   Final diagnoses:  Anxiety  Anxiety disorder, unspecified anxiety disorder type    Is given a prescription for Atarax. He is 2 weeks from his last dose of Xanax. I think he is safely out of the window for medically significant benzodiazepine withdrawal or seizures. Atarax should suffice for his anxiety. Encouraged primary care physician followup for ongoing care of his anxiety    Rolland Porter, MD 09/07/14 2009

## 2014-09-13 ENCOUNTER — Emergency Department (HOSPITAL_COMMUNITY)
Admission: EM | Admit: 2014-09-13 | Discharge: 2014-09-14 | Disposition: A | Discharge status: Home / Self Care | Payer: Self-pay | Attending: Emergency Medicine | Admitting: Emergency Medicine

## 2014-09-13 ENCOUNTER — Encounter (HOSPITAL_COMMUNITY): Payer: Self-pay | Admitting: Emergency Medicine

## 2014-09-13 DIAGNOSIS — I10 Essential (primary) hypertension: Secondary | ICD-10-CM | POA: Insufficient documentation

## 2014-09-13 DIAGNOSIS — M549 Dorsalgia, unspecified: Secondary | ICD-10-CM | POA: Insufficient documentation

## 2014-09-13 DIAGNOSIS — Z8673 Personal history of transient ischemic attack (TIA), and cerebral infarction without residual deficits: Secondary | ICD-10-CM | POA: Insufficient documentation

## 2014-09-13 DIAGNOSIS — F17200 Nicotine dependence, unspecified, uncomplicated: Secondary | ICD-10-CM | POA: Insufficient documentation

## 2014-09-13 DIAGNOSIS — G8929 Other chronic pain: Secondary | ICD-10-CM | POA: Insufficient documentation

## 2014-09-13 DIAGNOSIS — L509 Urticaria, unspecified: Secondary | ICD-10-CM | POA: Insufficient documentation

## 2014-09-13 DIAGNOSIS — F411 Generalized anxiety disorder: Secondary | ICD-10-CM | POA: Insufficient documentation

## 2014-09-13 DIAGNOSIS — F419 Anxiety disorder, unspecified: Secondary | ICD-10-CM

## 2014-09-13 MED ORDER — DIPHENHYDRAMINE HCL 25 MG PO CAPS
25.0000 mg | ORAL_CAPSULE | Freq: Once | ORAL | Status: AC
  Administered 2014-09-13: 25 mg via ORAL
  Filled 2014-09-13: qty 1

## 2014-09-13 MED ORDER — LORAZEPAM 1 MG PO TABS
1.0000 mg | ORAL_TABLET | Freq: Once | ORAL | Status: AC
  Administered 2014-09-13: 1 mg via ORAL
  Filled 2014-09-13: qty 1

## 2014-09-13 NOTE — ED Notes (Signed)
Pt states he is having hives due to withdrawal from not having xanax.

## 2014-09-13 NOTE — ED Provider Notes (Signed)
CSN: [id number]     Arrival date & time 09/13/14  2235 History   First MD Initiated Contact with Patient 09/13/14 2347     Chief Complaint  Patient presents with  . Urticaria     (Consider location/radiation/quality/duration/timing/severity/associated sxs/prior Treatment) HPI Comments: The patient is a 53 year old male who presents to the emergency apartment with complaint of hives. The patient states that over the past day and a half he has been noticing at times between his fingers on the palm of his pain and in between his toes. He states that he thinks that this is related to him not having his Xanax. The patient was seen in the emergency department on September 2 at which time he said that he was out of his Xanax. His primary physician was called and they have agreed to see him on the following day. The patient was seen in the emergency department again on September 18 at which time he was placed on Vistaril, and encouraged to see either his primary physician or to seek another physician to assist him with his anxiety. The patient states that he has not been able to find a physician who will give him the Xanax that he is requesting. He states that he does not want to take the Vistaril because he was told by the" pharmacist that this was an older medicine and that it had many side effects. The patient states that he feels sometimes as though he is choking and having difficulty with swallowing. He feels that all of these symptoms are related to not having his Xanax.  Patient is a 53 y.o. male presenting with urticaria. The history is provided by the patient.  Urticaria This is a recurrent problem. Associated symptoms include a rash. Pertinent negatives include no abdominal pain, arthralgias, chest pain, coughing or neck pain.    Past Medical History  Diagnosis Date  . Stroke   . Hypertension   . Chronic pain    History reviewed. No pertinent past surgical history. Family History  Problem  Relation Age of Onset  . Stroke Father   . Heart failure Father    History  Substance Use Topics  . Smoking status: Current Every Day Smoker -- 0.50 packs/day for 27 years    Types: Cigarettes  . Smokeless tobacco: Never Used  . Alcohol Use: No    Review of Systems  Constitutional: Negative for activity change.       All ROS Neg except as noted in HPI  Eyes: Negative for photophobia and discharge.  Respiratory: Negative for cough, shortness of breath and wheezing.   Cardiovascular: Negative for chest pain and palpitations.  Gastrointestinal: Negative for abdominal pain and blood in stool.  Genitourinary: Negative for dysuria, frequency and hematuria.  Musculoskeletal: Positive for back pain. Negative for arthralgias and neck pain.  Skin: Positive for rash.  Neurological: Negative for dizziness, seizures and speech difficulty.  Psychiatric/Behavioral: Negative for hallucinations and confusion. The patient is nervous/anxious.       Allergies  Codeine and Penicillins  Home Medications   Prior to Admission medications   Medication Sig Start Date End Date Taking? Authorizing Provider  albuterol (PROVENTIL HFA;VENTOLIN HFA) 108 (90 BASE) MCG/ACT inhaler Inhale 2 puffs into the lungs every 6 (six) hours as needed. For shortness of breath    Historical Provider, MD  alprazolam Prudy Feeler) 2 MG tablet Take 2 mg by mouth 4 (four) times daily.    Historical Provider, MD  aspirin EC 81 MG tablet Take  81 mg by mouth daily.      Historical Provider, MD  cyclobenzaprine (FLEXERIL) 10 MG tablet Take 10 mg by mouth 3 (three) times daily as needed for muscle spasms.    Historical Provider, MD  hydrOXYzine (ATARAX/VISTARIL) 25 MG tablet Take 1 tablet (25 mg total) by mouth every 8 (eight) hours as needed for anxiety. 09/07/14   Rolland Porter, MD   BP 126/73  Pulse 77  Temp(Src) 98.9 F (37.2 C)  Resp 18  Ht  (1.753 m)  Wt 188 lb (85.276 kg)  BMI 27.75 kg/m2  SpO2 100% Physical Exam   Nursing note and vitals reviewed. Constitutional: He is oriented to person, place, and time. He appears well-developed and well-nourished.  Non-toxic appearance.  HENT:  Head: Normocephalic.  Right Ear: Tympanic membrane and external ear normal.  Left Ear: Tympanic membrane and external ear normal.  The airway is patent. The uvula is in the midline. There is no swelling of the tongue.  Eyes: EOM and lids are normal. Pupils are equal, round, and reactive to light.  Neck: Normal range of motion. Neck supple. Carotid bruit is not present.  Cardiovascular: Normal rate, regular rhythm, normal heart sounds, intact distal pulses and normal pulses.   Pulmonary/Chest: Breath sounds normal. No stridor. No respiratory distress.  Lungs are clear. There no wheezes. The patient speaks in complete sentences. There is symmetrical rise and fall of the chest.  Abdominal: Soft. Bowel sounds are normal. There is no tenderness. There is no guarding.  Musculoskeletal: Normal range of motion.  Patient has red raised areas of the palmar surface of the right and left hand. A few in between the fingers of the right and left hand.  Lymphadenopathy:       Head (right side): No submandibular adenopathy present.       Head (left side): No submandibular adenopathy present.    He has no cervical adenopathy.  Neurological: He is alert and oriented to person, place, and time. He has normal strength. No cranial nerve deficit or sensory deficit.  Skin: Skin is warm and dry.  There are red raised areas on the palmar surface of the hands between the fingers, there no red raised areas on the neck, chest, abdomen, or upper arms.  Psychiatric: He has a normal mood and affect. His speech is normal. He expresses no homicidal and no suicidal ideation. He expresses no suicidal plans and no homicidal plans.    ED Course  Procedures (including critical care time) Labs Review Labs Reviewed - No data to display  Imaging Review No  results found.   EKG Interpretation None      MDM Patient given Benadryl and Ativan 1 mg here in the emergency department with some improvement of the patient's anxiousness and itching. Patient denies suicidal or homicidal ideations.   the plan at this time is for the patient to use Benadryl for assistance with his itching and also to help him rest at night. The patient is to seek a physician of his choice to help him with the anxiety and Xanax. I have further encouraged patient to see the doctors at [facility] for assistance with his anxiety.   Final diagnoses:  None    *I have reviewed nursing notes, vital signs, and all appropriate lab and imaging results for this patient.Kathie Dike, PA-C 09/14/14 [phone]

## 2014-09-14 NOTE — ED Provider Notes (Signed)
Medical screening examination/treatment/procedure(s) were performed by non-physician practitioner and as supervising physician I was immediately available for consultation/collaboration.   EKG Interpretation None       Muriah Harsha R. Blanka Rockholt, MD 09/14/14 0711 

## 2014-09-14 NOTE — Discharge Instructions (Signed)
You may consider using Benadryl every 6 hours if needed for hives. May also use at bedtime if needed for assistance with your breast. Please see Dr.Niemeyer, or positions at the behavioral health for assistance with your anxiety. Hives Hives are itchy, red, swollen areas of the skin. They can vary in size and location on your body. Hives can come and go for hours or several days (acute hives) or for several weeks (chronic hives). Hives do not spread from person to person (noncontagious). They may get worse with scratching, exercise, and emotional stress. CAUSES   Allergic reaction to food, additives, or drugs.  Infections, including the common cold.  Illness, such as vasculitis, lupus, or thyroid disease.  Exposure to sunlight, heat, or cold.  Exercise.  Stress.  Contact with chemicals. SYMPTOMS   Red or white swollen patches on the skin. The patches may change size, shape, and location quickly and repeatedly.  Itching.  Swelling of the hands, feet, and face. This may occur if hives develop deeper in the skin. DIAGNOSIS  Your caregiver can usually tell what is wrong by performing a physical exam. Skin or blood tests may also be done to determine the cause of your hives. In some cases, the cause cannot be determined. TREATMENT  Mild cases usually get better with medicines such as antihistamines. Severe cases may require an emergency epinephrine injection. If the cause of your hives is known, treatment includes avoiding that trigger.  HOME CARE INSTRUCTIONS   Avoid causes that trigger your hives.  Take antihistamines as directed by your caregiver to reduce the severity of your hives. Non-sedating or low-sedating antihistamines are usually recommended. Do not drive while taking an antihistamine.  Take any other medicines prescribed for itching as directed by your caregiver.  Wear loose-fitting clothing.  Keep all follow-up appointments as directed by your caregiver. SEEK MEDICAL  CARE IF:   You have persistent or severe itching that is not relieved with medicine.  You have painful or swollen joints. SEEK IMMEDIATE MEDICAL CARE IF:   You have a fever.  Your tongue or lips are swollen.  You have trouble breathing or swallowing.  You feel tightness in the throat or chest.  You have abdominal pain. These problems may be the first sign of a life-threatening allergic reaction. Call your local emergency services (911 in [place] MAKE SURE YOU:   Understand these instructions.  Will watch your condition.  Will get help right away if you are not doing well or get worse. Document Released: 12/08/2005 Document Revised: 12/13/2013 Document Reviewed: 03/02/2012 [facility] Patient Information 2015 [place], [place]. This information is not intended to replace advice given to you by your health care provider. Make sure you discuss any questions you have with your health care provider.

## 2014-09-20 ENCOUNTER — Encounter (HOSPITAL_COMMUNITY): Payer: Self-pay | Admitting: Emergency Medicine

## 2014-09-20 ENCOUNTER — Emergency Department (HOSPITAL_COMMUNITY)
Admission: EM | Admit: 2014-09-20 | Discharge: 2014-09-20 | Disposition: A | Discharge status: Home / Self Care | Payer: Self-pay | Attending: Emergency Medicine | Admitting: Emergency Medicine

## 2014-09-20 DIAGNOSIS — M549 Dorsalgia, unspecified: Secondary | ICD-10-CM | POA: Insufficient documentation

## 2014-09-20 DIAGNOSIS — Z8673 Personal history of transient ischemic attack (TIA), and cerebral infarction without residual deficits: Secondary | ICD-10-CM | POA: Insufficient documentation

## 2014-09-20 DIAGNOSIS — F411 Generalized anxiety disorder: Secondary | ICD-10-CM | POA: Insufficient documentation

## 2014-09-20 DIAGNOSIS — Z760 Encounter for issue of repeat prescription: Secondary | ICD-10-CM | POA: Insufficient documentation

## 2014-09-20 DIAGNOSIS — F17200 Nicotine dependence, unspecified, uncomplicated: Secondary | ICD-10-CM | POA: Insufficient documentation

## 2014-09-20 DIAGNOSIS — Z79899 Other long term (current) drug therapy: Secondary | ICD-10-CM | POA: Insufficient documentation

## 2014-09-20 DIAGNOSIS — Z7982 Long term (current) use of aspirin: Secondary | ICD-10-CM | POA: Insufficient documentation

## 2014-09-20 DIAGNOSIS — I10 Essential (primary) hypertension: Secondary | ICD-10-CM | POA: Insufficient documentation

## 2014-09-20 DIAGNOSIS — G8929 Other chronic pain: Secondary | ICD-10-CM | POA: Insufficient documentation

## 2014-09-20 DIAGNOSIS — Z880 Allergy status to penicillin: Secondary | ICD-10-CM | POA: Insufficient documentation

## 2014-09-20 DIAGNOSIS — F419 Anxiety disorder, unspecified: Secondary | ICD-10-CM

## 2014-09-20 NOTE — ED Provider Notes (Signed)
CSN: [id number]     Arrival date & time 09/20/14  0917 History   First MD Initiated Contact with Patient 09/20/14 [phone]     Chief Complaint  Patient presents with  . Medication Refill     (Consider location/radiation/quality/duration/timing/severity/associated sxs/prior Treatment) HPI Comments: CC: I need my Xanax refilled  Patient is a 53 year old male who presents to the emergency department with a complaint of" I need my Xanax pill". Pt states he became anxious last night and developed a headache on the right side. He denies weakness,or vision changes or N/V/D. The patient states that he had been on Xanax for quite some time. He states that his primary physician" lost his license to give a prescription medicines" and patient has been unable to find another physician who will order his medication. It is of note that the patient was in the emergency department on September 23 at which time he was having hives that was thought to be anxiety related. The patient was treated in the emergency department and encouraged to see another primary physician, or 2 call the day [facility]. The patient states that he has not been able to find a physician who will see him, he father states that the Vistaril that he had been given is not helping much and he is afraid of this medication.  There's been no seizures, no vision changes reported, no tremors, diarrhea. There is some aggitation per patient.  The history is provided by the patient.    Past Medical History  Diagnosis Date  . Stroke   . Hypertension   . Chronic pain    History reviewed. No pertinent past surgical history. Family History  Problem Relation Age of Onset  . Stroke Father   . Heart failure Father    History  Substance Use Topics  . Smoking status: Current Every Day Smoker -- 0.50 packs/day for 27 years    Types: Cigarettes  . Smokeless tobacco: Never Used  . Alcohol Use: No    Review of Systems  Constitutional: Negative for  activity change.       All ROS Neg except as noted in HPI  Eyes: Negative for photophobia and discharge.  Respiratory: Negative for cough, shortness of breath and wheezing.   Cardiovascular: Negative for chest pain and palpitations.  Gastrointestinal: Negative for abdominal pain and blood in stool.  Genitourinary: Negative for dysuria, frequency and hematuria.  Musculoskeletal: Positive for arthralgias and back pain. Negative for neck pain.  Skin: Negative.   Neurological: Negative for dizziness, seizures and speech difficulty.  Psychiatric/Behavioral: Negative for hallucinations and confusion. The patient is nervous/anxious.       Allergies  Codeine and Penicillins  Home Medications   Prior to Admission medications   Medication Sig Start Date End Date Taking? Authorizing Provider  albuterol (PROVENTIL HFA;VENTOLIN HFA) 108 (90 BASE) MCG/ACT inhaler Inhale 2 puffs into the lungs every 6 (six) hours as needed. For shortness of breath    Historical Provider, MD  alprazolam Prudy Feeler) 2 MG tablet Take 2 mg by mouth 4 (four) times daily.    Historical Provider, MD  aspirin EC 81 MG tablet Take 81 mg by mouth daily.      Historical Provider, MD  cyclobenzaprine (FLEXERIL) 10 MG tablet Take 10 mg by mouth 3 (three) times daily as needed for muscle spasms.    Historical Provider, MD  hydrOXYzine (ATARAX/VISTARIL) 25 MG tablet Take 1 tablet (25 mg total) by mouth every 8 (eight) hours as needed for anxiety.  09/07/14   Rolland PorterMark James, MD   BP 128/76  Pulse 68  Temp(Src) 97.9 F (36.6 C)  Resp 18  Ht 5\' 9"  (1.753 m)  Wt 188 lb (85.276 kg)  BMI 27.75 kg/m2  SpO2 100% Physical Exam  Nursing note and vitals reviewed. Constitutional: He is oriented to person, place, and time. He appears well-developed and well-nourished.  Non-toxic appearance.  HENT:  Head: Normocephalic.  Right Ear: Tympanic membrane and external ear normal.  Left Ear: Tympanic membrane and external ear normal.  Eyes: EOM and  lids are normal. Pupils are equal, round, and reactive to light.  Neck: Normal range of motion. Neck supple. Carotid bruit is not present.  Cardiovascular: Normal rate, regular rhythm, normal heart sounds, intact distal pulses and normal pulses.  Exam reveals no gallop and no friction rub.   No murmur heard. Pulmonary/Chest: Breath sounds normal. No respiratory distress. He has no wheezes. He has no rales.  Abdominal: Soft. Bowel sounds are normal. There is no tenderness. There is no guarding.  Musculoskeletal: Normal range of motion.  Lymphadenopathy:       Head (right side): No submandibular adenopathy present.       Head (left side): No submandibular adenopathy present.    He has no cervical adenopathy.  Neurological: He is alert and oriented to person, place, and time. He has normal strength. No cranial nerve deficit or sensory deficit.  Gait steady. No gross neuro deficit noted.  Skin: Skin is warm and dry.  Psychiatric: He has a normal mood and affect. His speech is normal. He is not actively hallucinating. He expresses no homicidal and no suicidal ideation. He expresses no suicidal plans and no homicidal plans.    ED Course  Procedures (including critical care time) Labs Review Labs Reviewed - No data to display  Imaging Review No results found.   EKG Interpretation None      MDM  Vital signs are well within normal limits. No gross neurologic deficits appreciated on examination. Patient denies suicidal or homicidal ideations at this time. Patient states that [facility] and they told him that there would be no physicians available until October. [facility], they have 3 doctors available today, and there is no validity to the no  physician until October. [facility] with [facility]. I reassured the patient that his examination is not consistent with any severe or emergent  condition.    Final diagnoses:  None    **I have reviewed nursing notes, vital signs, and all appropriate lab and imaging results for this patient.Kathie Dike*    Toya Palacios M Fin Hupp, PA-C 09/20/14 [address]  Jhoan Schmieder M Brittnye Josephs, [place]PA-C 09/20/14 1008

## 2014-09-20 NOTE — Discharge Instructions (Signed)
Your vital signs are within normal limits.  I have contacted the day [facility], and there are 3 physicians there available today. They will have physicians there at all times, and there will be no times during this month or in October and there will be no physicians present. Please contact the [facility] for assistance with your [facility]ax.[phone])

## 2014-09-20 NOTE — ED Notes (Signed)
Pt states he has been out of his xanax 2mg  4 x day prescription since 08/26/14. Pt states his pcp lost his licence and he has been unable to find another.

## 2014-09-20 NOTE — ED Provider Notes (Signed)
Medical screening examination/treatment/procedure(s) were performed by non-physician practitioner and as supervising physician I was immediately available for consultation/collaboration.  Aven Christen, MD 09/20/14 1548 

## 2014-09-21 ENCOUNTER — Encounter (HOSPITAL_COMMUNITY): Payer: Self-pay | Admitting: Emergency Medicine

## 2014-09-21 ENCOUNTER — Emergency Department (HOSPITAL_COMMUNITY): Payer: Self-pay

## 2014-09-21 ENCOUNTER — Emergency Department (HOSPITAL_COMMUNITY)
Admission: EM | Admit: 2014-09-21 | Discharge: 2014-09-21 | Disposition: A | Discharge status: Home / Self Care | Payer: Self-pay | Attending: Emergency Medicine | Admitting: Emergency Medicine

## 2014-09-21 DIAGNOSIS — Y9289 Other specified places as the place of occurrence of the external cause: Secondary | ICD-10-CM | POA: Insufficient documentation

## 2014-09-21 DIAGNOSIS — W01198A Fall on same level from slipping, tripping and stumbling with subsequent striking against other object, initial encounter: Secondary | ICD-10-CM | POA: Insufficient documentation

## 2014-09-21 DIAGNOSIS — S300XXA Contusion of lower back and pelvis, initial encounter: Secondary | ICD-10-CM | POA: Insufficient documentation

## 2014-09-21 DIAGNOSIS — Z720 Tobacco use: Secondary | ICD-10-CM | POA: Insufficient documentation

## 2014-09-21 DIAGNOSIS — Z79899 Other long term (current) drug therapy: Secondary | ICD-10-CM | POA: Insufficient documentation

## 2014-09-21 DIAGNOSIS — Y9389 Activity, other specified: Secondary | ICD-10-CM | POA: Insufficient documentation

## 2014-09-21 DIAGNOSIS — Z8673 Personal history of transient ischemic attack (TIA), and cerebral infarction without residual deficits: Secondary | ICD-10-CM | POA: Insufficient documentation

## 2014-09-21 DIAGNOSIS — Z880 Allergy status to penicillin: Secondary | ICD-10-CM | POA: Insufficient documentation

## 2014-09-21 DIAGNOSIS — Z7982 Long term (current) use of aspirin: Secondary | ICD-10-CM | POA: Insufficient documentation

## 2014-09-21 DIAGNOSIS — I10 Essential (primary) hypertension: Secondary | ICD-10-CM | POA: Insufficient documentation

## 2014-09-21 DIAGNOSIS — G8929 Other chronic pain: Secondary | ICD-10-CM | POA: Insufficient documentation

## 2014-09-21 MED ORDER — NAPROXEN 500 MG PO TABS: 500.0000 mg | ORAL_TABLET | Freq: Two times a day (BID) | ORAL | Status: DC

## 2014-09-21 MED ORDER — NAPROXEN 250 MG PO TABS
500.0000 mg | ORAL_TABLET | Freq: Once | ORAL | Status: AC
  Administered 2014-09-21: 500 mg via ORAL
  Filled 2014-09-21: qty 2

## 2014-09-21 MED ORDER — HYDROCODONE-ACETAMINOPHEN 5-325 MG PO TABS
1.0000 | ORAL_TABLET | Freq: Once | ORAL | Status: AC
  Administered 2014-09-21: 1 via ORAL
  Filled 2014-09-21: qty 1

## 2014-09-21 NOTE — ED Notes (Signed)
Pt states he fell onto back in tub. C/o lower back pain.

## 2014-09-21 NOTE — Discharge Instructions (Signed)
Contusion °A contusion is a deep bruise. Contusions happen when an injury causes bleeding under the skin. Signs of bruising include pain, puffiness (swelling), and discolored skin. The contusion may turn blue, purple, or yellow. °HOME CARE  °· Put ice on the injured area. °¨ Put ice in a plastic bag. °¨ Place a towel between your skin and the bag. °¨ Leave the ice on for 15-20 minutes, 03-04 times a day. °· Only take medicine as told by your doctor. °· Rest the injured area. °· If possible, raise (elevate) the injured area to lessen puffiness. °GET HELP RIGHT AWAY IF:  °· You have more bruising or puffiness. °· You have pain that is getting worse. °· Your puffiness or pain is not helped by medicine. °MAKE SURE YOU:  °· Understand these instructions. °· Will watch your condition. °· Will get help right away if you are not doing well or get worse. °Document Released: 05/26/2008 Document Revised: 03/01/2012 Document Reviewed: 10/13/2011 °[employer]® Patient Information ©2015 [employer]. This information is not intended to replace advice given to you by your health care provider. Make sure you discuss any questions you have with your health care provider. ° °

## 2014-09-21 NOTE — ED Notes (Signed)
Pain low back after fall when getting out of bath tub. No HI,  Alert,

## 2014-09-23 NOTE — ED Provider Notes (Signed)
CSN: [id number]     Arrival date & time 09/21/14  1744 History   First MD Initiated Contact with Patient 09/21/14 1904     Chief Complaint  Patient presents with  . Fall     (Consider location/radiation/quality/duration/timing/severity/associated sxs/prior Treatment) The history is provided by the patient.   Gary Harrington is a 53 y.o. male presenting with pain in his lower back and sacrum since slipping and falling in the bathtub just prior to arrival.  He reports constant nonradiating pain which is worsened with movement.  He denies numbness or weakness in his legs and denies other injury including head or neck injury.  He has taken no medications prior to arrival.     Past Medical History  Diagnosis Date  . Stroke   . Hypertension   . Chronic pain    History reviewed. No pertinent past surgical history. Family History  Problem Relation Age of Onset  . Stroke Father   . Heart failure Father    History  Substance Use Topics  . Smoking status: Current Every Day Smoker -- 0.50 packs/day for 27 years    Types: Cigarettes  . Smokeless tobacco: Never Used  . Alcohol Use: No    Review of Systems  Constitutional: Negative for fever.  Respiratory: Negative for shortness of breath.   Cardiovascular: Negative for chest pain and leg swelling.  Gastrointestinal: Negative for abdominal pain, constipation and abdominal distention.  Genitourinary: Negative for dysuria, urgency, frequency, flank pain and difficulty urinating.  Musculoskeletal: Positive for back pain. Negative for gait problem and joint swelling.  Skin: Negative for rash.  Neurological: Negative for weakness and numbness.      Allergies  Codeine and Penicillins  Home Medications   Prior to Admission medications   Medication Sig Start Date End Date Taking? Authorizing Provider  albuterol (PROVENTIL HFA;VENTOLIN HFA) 108 (90 BASE) MCG/ACT inhaler Inhale 2 puffs into the lungs every 6 (six) hours as needed.  For shortness of breath   Yes Historical Provider, MD  alprazolam Prudy Feeler(XANAX) 2 MG tablet Take 2 mg by mouth 4 (four) times daily.   Yes Historical Provider, MD  aspirin EC 81 MG tablet Take 81 mg by mouth daily.     Yes Historical Provider, MD  cyclobenzaprine (FLEXERIL) 10 MG tablet Take 10 mg by mouth 3 (three) times daily as needed for muscle spasms.   Yes Historical Provider, MD  hydrOXYzine (ATARAX/VISTARIL) 25 MG tablet Take 1 tablet (25 mg total) by mouth every 8 (eight) hours as needed for anxiety. 09/07/14  Yes Rolland PorterMark James, MD  naproxen (NAPROSYN) 500 MG tablet Take 1 tablet (500 mg total) by mouth 2 (two) times daily. 09/21/14   Burgess AmorJulie [place], PA-C   BP 107/64  Pulse 104  Temp(Src) 97.7 F (36.5 C) (Oral)  Resp 20  Ht 5\' 9"  (1.753 m)  Wt 188 lb (85.276 kg)  BMI 27.75 kg/m2 Physical Exam  Nursing note and vitals reviewed. Constitutional: He appears well-developed and well-nourished.  HENT:  Head: Normocephalic.  Eyes: Conjunctivae are normal.  Neck: Normal range of motion. Neck supple.  Cardiovascular: Normal rate and intact distal pulses.   Pedal pulses normal.  Pulmonary/Chest: Effort normal.  Abdominal: Soft. Bowel sounds are normal. He exhibits no distension and no mass.  Musculoskeletal: Normal range of motion. He exhibits no edema.       Lumbar back: He exhibits bony tenderness. He exhibits no swelling, no edema, no deformity and no spasm.  Midline lumbar and sacral pain.  No bruising, abrasions, stepoffs, deformity.  Neurological: He is alert. He has normal strength. He displays no atrophy and no tremor. No sensory deficit. Gait normal.  Reflex Scores:      Patellar reflexes are 2+ on the right side and 2+ on the left side.      Achilles reflexes are 2+ on the right side and 2+ on the left side. No strength deficit noted in hip and knee flexor and extensor muscle groups.  Ankle flexion and extension intact.  Skin: Skin is warm and dry.  Psychiatric: He has a normal mood and  affect.    ED Course  Procedures (including critical care time) Labs Review Labs Reviewed - No data to display  Imaging Review No results found.   EKG Interpretation None      MDM   Final diagnoses:  Lumbar contusion, initial encounter  Coccyx contusion, initial encounter    Patients labs and/or radiological studies were viewed and considered during the medical decision making and disposition process. Pt advised ice tx x 2 days,  May add heat on day 3.  Naproxen prescribed.  Prn f/u with pcp if sx not improved over the next 7-10 days.   Burgess Amor, PA-C 09/23/14 2030

## 2014-09-25 NOTE — ED Provider Notes (Signed)
Medical screening examination/treatment/procedure(s) were performed by non-physician practitioner and as supervising physician I was immediately available for consultation/collaboration.   EKG Interpretation None      Sandee Bernath, MD, FACEP   Caleel Kiner L Brittain Smithey, MD 09/25/14 2220 

## 2014-09-27 ENCOUNTER — Telehealth: Payer: Self-pay | Admitting: Family Medicine

## 2014-09-27 NOTE — Telephone Encounter (Signed)
Patient aware that we do not prescribe controlled substances at this office.

## 2014-10-24 ENCOUNTER — Emergency Department (HOSPITAL_COMMUNITY): Payer: Self-pay

## 2014-10-24 ENCOUNTER — Encounter (HOSPITAL_COMMUNITY): Payer: Self-pay | Admitting: *Deleted

## 2014-10-24 ENCOUNTER — Emergency Department (HOSPITAL_COMMUNITY)
Admission: EM | Admit: 2014-10-24 | Discharge: 2014-10-24 | Disposition: A | Discharge status: Home / Self Care | Payer: Self-pay | Attending: Emergency Medicine | Admitting: Emergency Medicine

## 2014-10-24 DIAGNOSIS — Z791 Long term (current) use of non-steroidal anti-inflammatories (NSAID): Secondary | ICD-10-CM | POA: Insufficient documentation

## 2014-10-24 DIAGNOSIS — Z7982 Long term (current) use of aspirin: Secondary | ICD-10-CM | POA: Insufficient documentation

## 2014-10-24 DIAGNOSIS — R079 Chest pain, unspecified: Secondary | ICD-10-CM | POA: Insufficient documentation

## 2014-10-24 DIAGNOSIS — Z880 Allergy status to penicillin: Secondary | ICD-10-CM | POA: Insufficient documentation

## 2014-10-24 DIAGNOSIS — G8929 Other chronic pain: Secondary | ICD-10-CM | POA: Insufficient documentation

## 2014-10-24 DIAGNOSIS — Z79899 Other long term (current) drug therapy: Secondary | ICD-10-CM | POA: Insufficient documentation

## 2014-10-24 DIAGNOSIS — I10 Essential (primary) hypertension: Secondary | ICD-10-CM | POA: Insufficient documentation

## 2014-10-24 DIAGNOSIS — Z8673 Personal history of transient ischemic attack (TIA), and cerebral infarction without residual deficits: Secondary | ICD-10-CM | POA: Insufficient documentation

## 2014-10-24 DIAGNOSIS — Z720 Tobacco use: Secondary | ICD-10-CM | POA: Insufficient documentation

## 2014-10-24 LAB — BASIC METABOLIC PANEL
Anion gap: 13 (ref 5–15)
BUN: 10 mg/dL (ref 6–23)
CALCIUM: 8.9 mg/dL (ref 8.4–10.5)
CHLORIDE: 104 meq/L (ref 96–112)
CO2: 25 mEq/L (ref 19–32)
CREATININE: 0.8 mg/dL (ref 0.50–1.35)
GFR calc Af Amer: >90 mL/min (ref >90)
GFR calc non Af Amer: >90 mL/min (ref >90)
GLUCOSE: 107 mg/dL — AB (ref 70–99)
Potassium: 4 mEq/L (ref 3.7–5.3)
Sodium: 142 mEq/L (ref 137–147)

## 2014-10-24 LAB — CBC WITH DIFFERENTIAL/PLATELET
BASOS PCT: 1 % (ref 0–1)
Basophils Absolute: 0.1 10*3/uL (ref 0.0–0.1)
EOS ABS: 0.4 10*3/uL (ref 0.0–0.7)
EOS PCT: 4 % (ref 0–5)
HCT: 36 % — ABNORMAL LOW (ref 39.0–52.0)
Hemoglobin: 11.8 g/dL — ABNORMAL LOW (ref 13.0–17.0)
Lymphocytes Relative: 22 % (ref 12–46)
Lymphs Abs: 2.2 10*3/uL (ref 0.7–4.0)
MCH: 21.4 pg — ABNORMAL LOW (ref 26.0–34.0)
MCHC: 32.8 g/dL (ref 30.0–36.0)
MCV: 65.2 fL — AB (ref 78.0–100.0)
Monocytes Absolute: 0.5 10*3/uL (ref 0.1–1.0)
Monocytes Relative: 5 % (ref 3–12)
NEUTROS ABS: 6.8 10*3/uL (ref 1.7–7.7)
Neutrophils Relative %: 68 % (ref 43–77)
Platelets: 234 10*3/uL (ref 150–400)
RBC: 5.52 MIL/uL (ref 4.22–5.81)
RDW: 15.3 % (ref 11.5–15.5)
WBC: 10 10*3/uL (ref 4.0–10.5)

## 2014-10-24 LAB — TROPONIN I: Troponin I: <0.3 ng/mL (ref <0.30)

## 2014-10-24 NOTE — ED Provider Notes (Signed)
CSN: [id number]     Arrival date & time 10/24/14  1700 History  This chart was scribe for No att. providers found by Angelene GiovanniEmmanuella Mensah, ED Scribe. The patient was seen in room APA09/APA09 and the patient's care was started at 6:35 PM.    Chief Complaint  Patient presents with  . Dizziness  . Chest Pain   The history is provided by the patient. No language interpreter was used.   HPI Comments: Gary Harrington is a 53 y.o. male who presents to the Emergency Department complaining of a  constant  SOB, sweating, CP, dizziness onset 3-4 days. He reports that he currently smokes half a pack of cigarettes a day. He states that he walks with a slight limp. He states that he had a stroke a few years a go when he smoking about 3 packs of cigarettes a day. He reports that he was brought in to the ED by a neighbor. No alleviating factors noted.   PCP: Dr. Glenis SmokerNeimeyer   Past Medical History  Diagnosis Date  . Stroke   . Hypertension   . Chronic pain    History reviewed. No pertinent past surgical history. Family History  Problem Relation Age of Onset  . Stroke Father   . Heart failure Father    History  Substance Use Topics  . Smoking status: Current Every Day Smoker -- 0.50 packs/day for 27 years    Types: Cigarettes  . Smokeless tobacco: Never Used  . Alcohol Use: No    Review of Systems    Allergies  Codeine and Penicillins  Home Medications   Prior to Admission medications   Medication Sig Start Date End Date Taking? Authorizing Provider  albuterol (PROVENTIL HFA;VENTOLIN HFA) 108 (90 BASE) MCG/ACT inhaler Inhale 2 puffs into the lungs every 6 (six) hours as needed. For shortness of breath   Yes Historical Provider, MD  alprazolam Prudy Feeler(XANAX) 2 MG tablet Take 2 mg by mouth 4 (four) times daily.   Yes Historical Provider, MD  aspirin EC 81 MG tablet Take 81 mg by mouth daily.     Yes Historical Provider, MD  cyclobenzaprine (FLEXERIL) 10 MG tablet Take 10 mg by mouth 3 (three) times  daily.     Historical Provider, MD  hydrOXYzine (ATARAX/VISTARIL) 25 MG tablet Take 1 tablet (25 mg total) by mouth every 8 (eight) hours as needed for anxiety. Patient not taking: Reported on 10/24/2014 09/07/14   Rolland PorterMark James, MD  naproxen (NAPROSYN) 500 MG tablet Take 1 tablet (500 mg total) by mouth 2 (two) times daily. Patient not taking: Reported on 10/24/2014 09/21/14   Burgess AmorJulie Idol, PA-C   BP 111/71 mmHg  Pulse 70  Resp 16  SpO2 98% Physical Exam  Constitutional: He is oriented to person, place, and time. He appears well-developed and well-nourished.  HENT:  Head: Normocephalic and atraumatic.  Right Ear: External ear normal.  Left Ear: External ear normal.  Eyes: Conjunctivae and EOM are normal. Pupils are equal, round, and reactive to light.  Neck: Normal range of motion and phonation normal. Neck supple.  Cardiovascular: Normal rate, regular rhythm and normal heart sounds.   Pulmonary/Chest: Effort normal and breath sounds normal. He has no wheezes. He has no rales. He exhibits no bony tenderness.  Bilateral rhonchi  Abdominal: Soft. There is no tenderness.  Musculoskeletal: Normal range of motion.  Neurological: He is alert and oriented to person, place, and time. No cranial nerve deficit or sensory deficit. He exhibits normal muscle tone.  Coordination normal.  Skin: Skin is warm, dry and intact.  Psychiatric: He has a normal mood and affect. His behavior is normal. Judgment and thought content normal.  Nursing note and vitals reviewed.   ED Course  Procedures (including critical care time) Medications - No data to display Patient Vitals for the past 24 hrs:  BP Pulse Resp SpO2  10/24/14 1957 111/71 mmHg 70 16 98 %  10/24/14 1834 112/76 mmHg 73 18 95 %   12:04 AM- Pt advised of plan for treatment and pt agrees.  Labs Review Labs Reviewed  CBC WITH DIFFERENTIAL - Abnormal; Notable for the following:    Hemoglobin 11.8 (*)    HCT 36.0 (*)    MCV 65.2 (*)    MCH 21.4 (*)     All other components within normal limits  BASIC METABOLIC PANEL - Abnormal; Notable for the following:    Glucose, Bld 107 (*)    All other components within normal limits  TROPONIN I    Imaging Review Dg Chest 2 View  10/24/2014   CLINICAL DATA:  Chest pain.  Diaphoresis.  Dizziness.  EXAM: CHEST  2 VIEW  COMPARISON:  08/02/2014  FINDINGS: The heart size and mediastinal contours are within normal limits. Both lungs are clear. No evidence of pneumothorax or pleural effusion. Old left rib fracture deformities again noted.  IMPRESSION: No active cardiopulmonary disease.   Electronically Signed   By: Myles RosenthalJohn  Stahl M.D.   On: 10/24/2014 19:51     EKG Interpretation   Date/Time:  Tuesday October 24 2014 19:06:15 EST Ventricular Rate:  74 PR Interval:  163 QRS Duration: 88 QT Interval:  387 QTC Calculation: 429 R Axis:   69 Text Interpretation:  Sinus rhythm since last tracing no significant  change Confirmed by Tameeka Luo  MD, Polly Barner ([id number]) on 10/24/2014 9:16:50 PM      MDM   Final diagnoses:  Chest pain, unspecified chest pain type    Nonspecific chest pain, with negative findings.doubt ACS, PE, or pneumonia.  Nursing Notes Reviewed/ Care Coordinated Applicable Imaging Reviewed Interpretation of Laboratory Data incorporated into ED treatment  The patient appears reasonably screened and/or stabilized for discharge and I doubt any other medical condition or other [facility] requiring further screening, evaluation, or treatment in the ED at this time prior to discharge.  Plan: Home Medications- Tylenol prn pain; Home Treatments- rest; return here if the recommended treatment, does not improve the symptoms; Recommended follow up- PCP prn  I personally performed the services described in this documentation, which was scribed in my presence. The recorded information has been reviewed and is accurate.    Flint MelterElliott L Neva Ramaswamy, MD 10/25/14 0005

## 2014-10-24 NOTE — ED Notes (Signed)
Pt states he has not had medicine in a month, His doctor is in law suits for giving out to many prescription drugs. Pt has taken xanax for years.  Pt sweating, difficulty swallowing and notice difference in speech, chest pain.

## 2014-10-24 NOTE — Discharge Instructions (Signed)

## 2014-10-24 NOTE — ED Notes (Signed)
Pt with diaphoresis for couple of days, pt states ran out of Xanax for a month, possible withdrawling

## 2014-12-21 ENCOUNTER — Emergency Department (HOSPITAL_COMMUNITY)
Admission: EM | Admit: 2014-12-21 | Discharge: 2014-12-21 | Disposition: A | Discharge status: Home / Self Care | Payer: Self-pay | Attending: Emergency Medicine | Admitting: Emergency Medicine

## 2014-12-21 ENCOUNTER — Encounter (HOSPITAL_COMMUNITY): Payer: Self-pay | Admitting: *Deleted

## 2014-12-21 DIAGNOSIS — K029 Dental caries, unspecified: Secondary | ICD-10-CM | POA: Insufficient documentation

## 2014-12-21 DIAGNOSIS — K0889 Other specified disorders of teeth and supporting structures: Secondary | ICD-10-CM

## 2014-12-21 DIAGNOSIS — I10 Essential (primary) hypertension: Secondary | ICD-10-CM | POA: Insufficient documentation

## 2014-12-21 DIAGNOSIS — Z79899 Other long term (current) drug therapy: Secondary | ICD-10-CM | POA: Insufficient documentation

## 2014-12-21 DIAGNOSIS — Z880 Allergy status to penicillin: Secondary | ICD-10-CM | POA: Insufficient documentation

## 2014-12-21 DIAGNOSIS — Z720 Tobacco use: Secondary | ICD-10-CM | POA: Insufficient documentation

## 2014-12-21 DIAGNOSIS — K088 Other specified disorders of teeth and supporting structures: Secondary | ICD-10-CM | POA: Insufficient documentation

## 2014-12-21 DIAGNOSIS — Z7982 Long term (current) use of aspirin: Secondary | ICD-10-CM | POA: Insufficient documentation

## 2014-12-21 DIAGNOSIS — Z8673 Personal history of transient ischemic attack (TIA), and cerebral infarction without residual deficits: Secondary | ICD-10-CM | POA: Insufficient documentation

## 2014-12-21 DIAGNOSIS — G8929 Other chronic pain: Secondary | ICD-10-CM | POA: Insufficient documentation

## 2014-12-21 MED ORDER — HYDROCODONE-ACETAMINOPHEN 5-325 MG PO TABS
1.0000 | ORAL_TABLET | Freq: Once | ORAL | Status: AC
  Administered 2014-12-21: 1 via ORAL
  Filled 2014-12-21: qty 1

## 2014-12-21 MED ORDER — CLINDAMYCIN HCL 150 MG PO CAPS
300.0000 mg | ORAL_CAPSULE | Freq: Once | ORAL | Status: AC
  Administered 2014-12-21: 300 mg via ORAL
  Filled 2014-12-21: qty 2

## 2014-12-21 MED ORDER — CLINDAMYCIN HCL 150 MG PO CAPS: 300.0000 mg | ORAL_CAPSULE | Freq: Four times a day (QID) | ORAL | Status: DC

## 2014-12-21 MED ORDER — NAPROXEN 500 MG PO TABS: 500.0000 mg | ORAL_TABLET | Freq: Two times a day (BID) | ORAL | Status: DC

## 2014-12-21 NOTE — Discharge Instructions (Signed)

## 2014-12-21 NOTE — ED Notes (Signed)
Generalized dental pain.  Has appointment next week for extraction of all teeth.

## 2014-12-21 NOTE — ED Provider Notes (Signed)
CSN: [id number]     Arrival date & time 12/21/14  1620 History   First MD Initiated Contact with Patient 12/21/14 1636     Chief Complaint  Patient presents with  . Dental Pain     (Consider location/radiation/quality/duration/timing/severity/associated sxs/prior Treatment) HPI  Gary HearingSamuel T Harrington is a 53 y.o. male who presents to the Emergency Department complaining of dental pain.  He has been having pain to his lower teeth for several days.  Pain is worse with cold or hot foods and liquids.  He also states that his teeth "feel loose". He has an appt with a dentist in 2 weeks for extraction of his lower teeth and states that he is also going to have his upper teeth extracted as well.  He denies facial swelling, fever, chills, difficulty swallowing or breathing.     Past Medical History  Diagnosis Date  . Stroke   . Hypertension   . Chronic pain    History reviewed. No pertinent past surgical history. Family History  Problem Relation Age of Onset  . Stroke Father   . Heart failure Father    History  Substance Use Topics  . Smoking status: Current Every Day Smoker -- 0.50 packs/day for 27 years    Types: Cigarettes  . Smokeless tobacco: Never Used  . Alcohol Use: No    Review of Systems  Constitutional: Negative for fever and appetite change.  HENT: Positive for dental problem. Negative for congestion, facial swelling, sore throat and trouble swallowing.   Eyes: Negative for pain and visual disturbance.  Musculoskeletal: Negative for neck pain and neck stiffness.  Neurological: Negative for dizziness, facial asymmetry and headaches.  Hematological: Negative for adenopathy.  All other systems reviewed and are negative.     Allergies  Codeine and Penicillins  Home Medications   Prior to Admission medications   Medication Sig Start Date End Date Taking? Authorizing Provider  albuterol (PROVENTIL HFA;VENTOLIN HFA) 108 (90 BASE) MCG/ACT inhaler Inhale 2 puffs into the  lungs every 6 (six) hours as needed. For shortness of breath    Historical Provider, MD  alprazolam Prudy Feeler(XANAX) 2 MG tablet Take 2 mg by mouth 4 (four) times daily.    Historical Provider, MD  aspirin EC 81 MG tablet Take 81 mg by mouth daily.      Historical Provider, MD  clindamycin (CLEOCIN) 150 MG capsule Take 2 capsules (300 mg total) by mouth 4 (four) times daily. For 7 days 12/21/14   Storm Sovine L. Torres [place], PA-C  cyclobenzaprine (FLEXERIL) 10 MG tablet Take 10 mg by mouth 3 (three) times daily.     Historical Provider, MD  hydrOXYzine (ATARAX/VISTARIL) 25 MG tablet Take 1 tablet (25 mg total) by mouth every 8 (eight) hours as needed for anxiety. Patient not taking: Reported on 10/24/2014 09/07/14   Rolland PorterMark James, MD  naproxen (NAPROSYN) 500 MG tablet Take 1 tablet (500 mg total) by mouth 2 (two) times daily. 12/21/14   Kita Neace L. Waverley Krempasky, PA-C   BP 125/79 mmHg  Pulse 102  Temp(Src) 98 F (36.7 C) (Oral)  Resp 16  Ht 5\' 9"  (1.753 m)  Wt 188 lb (85.276 kg)  BMI 27.75 kg/m2  SpO2 99% Physical Exam  Constitutional: He is oriented to person, place, and time. He appears well-developed and well-nourished. No distress.  HENT:  Head: Normocephalic and atraumatic.  Right Ear: Tympanic membrane and ear canal normal.  Left Ear: Tympanic membrane and ear canal normal.  Mouth/Throat: Uvula is midline, oropharynx is clear  and moist and mucous membranes are normal. No trismus in the jaw. Dental caries present. No dental abscesses or uvula swelling.  Widespread dental decay with tenderness to palpation of the lower central and lateral incisors. No obvious loosening.  Mild swelling of the surrounding gums.  No facial swelling, obvious dental abscess, trismus, or sublingual abnml.    Neck: Normal range of motion. Neck supple.  Cardiovascular: Normal rate, regular rhythm and normal heart sounds.   No murmur heard. Pulmonary/Chest: Effort normal and breath sounds normal. No respiratory distress.  Musculoskeletal:  Normal range of motion.  Lymphadenopathy:    He has no cervical adenopathy.  Neurological: He is alert and oriented to person, place, and time. He exhibits normal muscle tone. Coordination normal.  Skin: Skin is warm and dry.  Nursing note and vitals reviewed.   ED Course  Procedures (including critical care time) Labs Review Labs Reviewed - No data to display  Imaging Review No results found.   EKG Interpretation None      MDM   Final diagnoses:  Pain, dental    Pt is well appearing, no concerning sx's for infection to the floor of the mouth or deep structures of the neck. He has hx chronic dental disease and multiple ED visits for same.  Requesting prescription pain medications.  I have explained that he does not have a medical indication for narcotics at this time and that I will prescribe clindamycin and naproxen.  Pt verbalized understanding of d/c plan.      Abdishakur Gottschall L. Trisha Mangleriplett, PA-C 12/21/14 1728  Hilario Quarryanielle S Ray, MD 12/22/14 1500

## 2014-12-21 NOTE — ED Notes (Signed)
Patient with no complaints at this time. Respirations even and unlabored. Skin warm/dry. Discharge instructions reviewed with patient at this time. Patient given opportunity to voice concerns/ask questions. Patient discharged at this time and left Emergency Department with steady gait.   

## 2014-12-26 ENCOUNTER — Encounter (HOSPITAL_COMMUNITY): Payer: Self-pay | Admitting: Emergency Medicine

## 2014-12-26 ENCOUNTER — Emergency Department (HOSPITAL_COMMUNITY)
Admission: EM | Admit: 2014-12-26 | Discharge: 2014-12-26 | Disposition: A | Discharge status: Home / Self Care | Payer: Self-pay | Attending: Emergency Medicine | Admitting: Emergency Medicine

## 2014-12-26 DIAGNOSIS — K029 Dental caries, unspecified: Secondary | ICD-10-CM | POA: Insufficient documentation

## 2014-12-26 DIAGNOSIS — Z792 Long term (current) use of antibiotics: Secondary | ICD-10-CM | POA: Insufficient documentation

## 2014-12-26 DIAGNOSIS — M549 Dorsalgia, unspecified: Secondary | ICD-10-CM | POA: Insufficient documentation

## 2014-12-26 DIAGNOSIS — Z7982 Long term (current) use of aspirin: Secondary | ICD-10-CM | POA: Insufficient documentation

## 2014-12-26 DIAGNOSIS — W541XXA Struck by dog, initial encounter: Secondary | ICD-10-CM | POA: Insufficient documentation

## 2014-12-26 DIAGNOSIS — Z791 Long term (current) use of non-steroidal anti-inflammatories (NSAID): Secondary | ICD-10-CM | POA: Insufficient documentation

## 2014-12-26 DIAGNOSIS — Y92003 Bedroom of unspecified non-institutional (private) residence as the place of occurrence of the external cause: Secondary | ICD-10-CM | POA: Insufficient documentation

## 2014-12-26 DIAGNOSIS — R531 Weakness: Secondary | ICD-10-CM | POA: Insufficient documentation

## 2014-12-26 DIAGNOSIS — I10 Essential (primary) hypertension: Secondary | ICD-10-CM | POA: Insufficient documentation

## 2014-12-26 DIAGNOSIS — Z8673 Personal history of transient ischemic attack (TIA), and cerebral infarction without residual deficits: Secondary | ICD-10-CM | POA: Insufficient documentation

## 2014-12-26 DIAGNOSIS — G8929 Other chronic pain: Secondary | ICD-10-CM | POA: Insufficient documentation

## 2014-12-26 DIAGNOSIS — Z79899 Other long term (current) drug therapy: Secondary | ICD-10-CM | POA: Insufficient documentation

## 2014-12-26 DIAGNOSIS — Z880 Allergy status to penicillin: Secondary | ICD-10-CM | POA: Insufficient documentation

## 2014-12-26 DIAGNOSIS — F419 Anxiety disorder, unspecified: Secondary | ICD-10-CM | POA: Insufficient documentation

## 2014-12-26 DIAGNOSIS — Z720 Tobacco use: Secondary | ICD-10-CM | POA: Insufficient documentation

## 2014-12-26 DIAGNOSIS — S0181XA Laceration without foreign body of other part of head, initial encounter: Secondary | ICD-10-CM | POA: Insufficient documentation

## 2014-12-26 DIAGNOSIS — Y9389 Activity, other specified: Secondary | ICD-10-CM | POA: Insufficient documentation

## 2014-12-26 DIAGNOSIS — Y998 Other external cause status: Secondary | ICD-10-CM | POA: Insufficient documentation

## 2014-12-26 MED ORDER — PROMETHAZINE HCL 12.5 MG PO TABS
12.5000 mg | ORAL_TABLET | Freq: Once | ORAL | Status: AC
  Administered 2014-12-26: 12.5 mg via ORAL
  Filled 2014-12-26: qty 1

## 2014-12-26 MED ORDER — LIDOCAINE-EPINEPHRINE (PF) 1 %-1:200000 IJ SOLN
10.0000 mL | Freq: Once | INTRAMUSCULAR | Status: AC
  Administered 2014-12-26: 10 mL
  Filled 2014-12-26: qty 10

## 2014-12-26 MED ORDER — HYDROCODONE-ACETAMINOPHEN 5-325 MG PO TABS
1.0000 | ORAL_TABLET | Freq: Once | ORAL | Status: AC
  Administered 2014-12-26: 1 via ORAL
  Filled 2014-12-26: qty 1

## 2014-12-26 NOTE — Discharge Instructions (Signed)
The puncture wound to the mold of the forehead was repaired with a chromic stitch. This will dissolve on its own. See Dr Lacie ScottsNiemeyer if any signs of infection.

## 2014-12-26 NOTE — Care Management Note (Signed)
ED/CM noted patient did not have health insurance and/or PCP listed in the computer.  Patient was given the [place] resource handout with information on the clinics, food pantries, and the handout for new health insurance sign-up. Pt was also given a Rx discount card. Patient expressed appreciation for information received. 

## 2014-12-26 NOTE — ED Notes (Signed)
Per patient son's dog jumped in bed with him this morning, dog's paw hit mole/wart on forehead. Patient states "I take an aspirin a day and it took forever to stop bleeding. With my luck I don't want to go to sleep with it like this." No active bleeding noted at this time.

## 2014-12-29 NOTE — ED Provider Notes (Signed)
CSN: [id number]     Arrival date & time 12/26/14  0905 History   First MD Initiated Contact with Patient 12/26/14 1046     Chief Complaint  Patient presents with  . Wound Check     (Consider location/radiation/quality/duration/timing/severity/associated sxs/prior Treatment) HPI Comments: Patient is 54 year old male who presents to the emergency department with a complaint of laceration and bleeding of the 4 head. The patient states that he has a mole on his forehead. He states that his dog climbed on top of him while in bed, the dog's paw hit the mole on the forehead and started to bleed. It is of note that the patient takes daily aspirin, and the patient states that he had to apply pressure for extended period of time to get the bleeding to slow up. There are no other lacerations to the 4 head or any other parts of the body. The patient does not take any other anticoagulation related medications. He has no history of bleeding disorders. Applying pressure helps with the bleeding. Nothing seems to make the problem worse.  The history is provided by the patient.    Past Medical History  Diagnosis Date  . Stroke   . Hypertension   . Chronic pain    History reviewed. No pertinent past surgical history. Family History  Problem Relation Age of Onset  . Stroke Father   . Heart failure Father    History  Substance Use Topics  . Smoking status: Current Every Day Smoker -- 0.50 packs/day for 27 years    Types: Cigarettes  . Smokeless tobacco: Never Used  . Alcohol Use: No    Review of Systems  Constitutional: Negative for activity change.       All ROS Neg except as noted in HPI  HENT: Positive for dental problem. Negative for nosebleeds.   Eyes: Negative for photophobia and discharge.  Respiratory: Negative for cough, shortness of breath and wheezing.   Cardiovascular: Negative for chest pain and palpitations.  Gastrointestinal: Negative for abdominal pain and blood in stool.   Genitourinary: Negative for dysuria, frequency and hematuria.  Musculoskeletal: Positive for back pain and arthralgias. Negative for neck pain.  Skin: Negative.   Neurological: Positive for weakness. Negative for dizziness, seizures and speech difficulty.  Psychiatric/Behavioral: Negative for hallucinations and confusion. The patient is nervous/anxious.       Allergies  Codeine and Penicillins  Home Medications   Prior to Admission medications   Medication Sig Start Date End Date Taking? Authorizing Provider  albuterol (PROVENTIL HFA;VENTOLIN HFA) 108 (90 BASE) MCG/ACT inhaler Inhale 2 puffs into the lungs every 6 (six) hours as needed. For shortness of breath    Historical Provider, MD  alprazolam Prudy Feeler) 2 MG tablet Take 2 mg by mouth 4 (four) times daily.    Historical Provider, MD  aspirin EC 81 MG tablet Take 81 mg by mouth daily.      Historical Provider, MD  clindamycin (CLEOCIN) 150 MG capsule Take 2 capsules (300 mg total) by mouth 4 (four) times daily. For 7 days 12/21/14   Tammy L. Triplett, PA-C  cyclobenzaprine (FLEXERIL) 10 MG tablet Take 10 mg by mouth 3 (three) times daily.     Historical Provider, MD  hydrOXYzine (ATARAX/VISTARIL) 25 MG tablet Take 1 tablet (25 mg total) by mouth every 8 (eight) hours as needed for anxiety. Patient not taking: Reported on 10/24/2014 09/07/14   Rolland Porter, MD  naproxen (NAPROSYN) 500 MG tablet Take 1 tablet (500 mg total) by  mouth 2 (two) times daily. 12/21/14   Tammy L. Triplett, PA-C   BP 119/83 mmHg  Pulse 89  Temp(Src) 97.9 F (36.6 C) (Oral)  Resp 18  Ht 5\' 9"  (1.753 m)  Wt 188 lb (85.276 kg)  BMI 27.75 kg/m2  SpO2 99% Physical Exam  Constitutional: He is oriented to person, place, and time. He appears well-developed and well-nourished.  Non-toxic appearance.  HENT:  Head: Normocephalic.  Right Ear: Tympanic membrane and external ear normal.  Left Ear: Tympanic membrane and external ear normal.  Multiple dental caries  present, but no trauma to the tongue, gums, or lips.  Patient has a mold in the middle of the 4 head. There is a small laceration present. The bleeding has been controlled. No other lacerations noted of the 4 head, or eyebrows.  Eyes: EOM and lids are normal. Pupils are equal, round, and reactive to light.  Neck: Normal range of motion. Neck supple. Carotid bruit is not present.  Cardiovascular: Normal rate, regular rhythm, normal heart sounds, intact distal pulses and normal pulses.   Pulmonary/Chest: Breath sounds normal. No respiratory distress.  Abdominal: Soft. Bowel sounds are normal. There is no tenderness. There is no guarding.  Musculoskeletal: Normal range of motion.  Lymphadenopathy:       Head (right side): No submandibular adenopathy present.       Head (left side): No submandibular adenopathy present.    He has no cervical adenopathy.  Neurological: He is alert and oriented to person, place, and time. He has normal strength. No cranial nerve deficit or sensory deficit.  Skin: Skin is warm and dry.  Psychiatric: His speech is normal. His mood appears anxious.  Nursing note and vitals reviewed.   ED Course  LACERATION REPAIR Date/Time: 12/26/2014 10:22 AM Performed by: Kathie DikeBRYANT, Mahum Betten M Authorized by: Kathie DikeBRYANT, Cari Burgo M Consent: Verbal consent obtained. Risks and benefits: risks, benefits and alternatives were discussed Consent given by: patient Patient understanding: patient states understanding of the procedure being performed Patient identity confirmed: arm band Time out: Immediately prior to procedure a "time out" was called to verify the correct patient, procedure, equipment, support staff and site/side marked as required. Body area: head/neck Location details: forehead Laceration length: 0.3 cm Foreign bodies: no foreign bodies Tendon involvement: none Nerve involvement: none Vascular damage: no Anesthesia: local infiltration Local anesthetic: lidocaine 1% with  epinephrine Anesthetic total: 2 ml Patient sedated: no Preparation: Patient was prepped and draped in the usual sterile fashion. Irrigation solution: tap water Amount of cleaning: standard Debridement: none Degree of undermining: none Wound skin closure material used: 5-0 chromic gut. Number of sutures: 1 Technique: simple Approximation: close Approximation difficulty: simple Dressing: bandaid. Patient tolerance: Patient tolerated the procedure well with no immediate complications   (including critical care time) Labs Review Labs Reviewed - No data to display  Imaging Review No results found.   EKG Interpretation None      MDM Bleeding completely resolved after a suture was placed around the bleeding mole area. Sterile Band-Aid was applied. Patient tolerated the procedure without problem. Advised patient that this suture will resolve on its own. He is to return if any changes or signs of infection.    Final diagnoses:  Laceration of forehead, initial encounter    **I have reviewed nursing notes, vital signs, and all appropriate lab and imaging results for this patient.Kathie Dike*    Rhilynn Preyer M Sheera Illingworth, PA-C 12/29/14 1147  Donnetta HutchingBrian Cook, MD 12/30/14 1054

## 2015-05-20 ENCOUNTER — Encounter (HOSPITAL_COMMUNITY): Payer: Self-pay | Admitting: *Deleted

## 2015-05-20 ENCOUNTER — Emergency Department (HOSPITAL_COMMUNITY)
Admission: EM | Admit: 2015-05-20 | Discharge: 2015-05-20 | Disposition: A | Discharge status: Home / Self Care | Payer: Self-pay | Attending: Emergency Medicine | Admitting: Emergency Medicine

## 2015-05-20 ENCOUNTER — Emergency Department (HOSPITAL_COMMUNITY): Payer: Self-pay

## 2015-05-20 DIAGNOSIS — Z8673 Personal history of transient ischemic attack (TIA), and cerebral infarction without residual deficits: Secondary | ICD-10-CM | POA: Insufficient documentation

## 2015-05-20 DIAGNOSIS — Z880 Allergy status to penicillin: Secondary | ICD-10-CM | POA: Insufficient documentation

## 2015-05-20 DIAGNOSIS — H5509 Other forms of nystagmus: Secondary | ICD-10-CM | POA: Insufficient documentation

## 2015-05-20 DIAGNOSIS — Z792 Long term (current) use of antibiotics: Secondary | ICD-10-CM | POA: Insufficient documentation

## 2015-05-20 DIAGNOSIS — G8929 Other chronic pain: Secondary | ICD-10-CM | POA: Insufficient documentation

## 2015-05-20 DIAGNOSIS — Z79899 Other long term (current) drug therapy: Secondary | ICD-10-CM | POA: Insufficient documentation

## 2015-05-20 DIAGNOSIS — Z720 Tobacco use: Secondary | ICD-10-CM | POA: Insufficient documentation

## 2015-05-20 DIAGNOSIS — H81399 Other peripheral vertigo, unspecified ear: Secondary | ICD-10-CM | POA: Insufficient documentation

## 2015-05-20 DIAGNOSIS — Z791 Long term (current) use of non-steroidal anti-inflammatories (NSAID): Secondary | ICD-10-CM | POA: Insufficient documentation

## 2015-05-20 DIAGNOSIS — I10 Essential (primary) hypertension: Secondary | ICD-10-CM | POA: Insufficient documentation

## 2015-05-20 DIAGNOSIS — J3489 Other specified disorders of nose and nasal sinuses: Secondary | ICD-10-CM | POA: Insufficient documentation

## 2015-05-20 DIAGNOSIS — Z7982 Long term (current) use of aspirin: Secondary | ICD-10-CM | POA: Insufficient documentation

## 2015-05-20 HISTORY — DX: Headache: R51

## 2015-05-20 HISTORY — DX: Dizziness and giddiness: R42

## 2015-05-20 HISTORY — DX: Headache, unspecified: R51.9

## 2015-05-20 MED ORDER — MECLIZINE HCL 12.5 MG PO TABS
25.0000 mg | ORAL_TABLET | Freq: Once | ORAL | Status: AC
  Administered 2015-05-20: 25 mg via ORAL
  Filled 2015-05-20: qty 2

## 2015-05-20 MED ORDER — MECLIZINE HCL 25 MG PO TABS: 25.0000 mg | ORAL_TABLET | Freq: Three times a day (TID) | ORAL | Status: DC | PRN

## 2015-05-20 NOTE — ED Notes (Signed)
Pt's wife states pt is confused. Pt alert and oriented x4. When I ask the wife about his confusion the only example she gives is that the pt denied being on xanax when talking to pharm tech.

## 2015-05-20 NOTE — Discharge Instructions (Signed)
°Emergency Department Resource Guide °1) Find a Doctor and Pay Out of Pocket °Although you won't have to find out who is covered by your insurance plan, it is a good idea to ask around and get recommendations. You will then need to call the office and see if the doctor you have chosen will accept you as a new patient and what types of options they offer for patients who are self-pay. Some doctors offer discounts or will set up payment plans for their patients who do not have insurance, but you will need to ask so you aren't surprised when you get to your appointment. ° °2) Contact Your Local Health Department °Not all health departments have doctors that can see patients for sick visits, but many do, so it is worth a call to see if yours does. If you don't know where your local health department is, you can check in your phone book. The CDC also has a tool to help you locate your state's health department, and many state websites also have listings of all of their local health departments. ° °3) Find a Walk-in Clinic °If your illness is not likely to be very severe or complicated, you may want to try a walk in clinic. These are popping up all over the country in pharmacies, drugstores, and shopping centers. They're usually staffed by nurse practitioners or physician assistants that have been trained to treat common illnesses and complaints. They're usually fairly quick and inexpensive. However, if you have serious medical issues or chronic medical problems, these are probably not your best option. ° °No Primary Care Doctor: °- Call [facility] Connect at  832-8000 - they can help you locate a primary care doctor that  accepts your insurance, provides certain services, etc. °- Physician Referral Service- 1-800-533-3463 ° °Chronic Pain Problems: °Organization         Address  Phone   Notes  °[facility]  (336) 297-2271 Patients need to be referred by their primary care doctor.  ° °Medication  Assistance: °Organization         Address  Phone   Notes  °[place] [facility] [address] °[place], [place] [address] (336) 641-8030 --Must be a resident of [place] °-- Must have NO insurance coverage whatsoever (no Medicaid/ Medicare, etc.) °-- The pt. MUST have a primary care doctor that directs their care regularly and follows them in the community °  °[facility]  (866) 331-1348   °[employer]  (888) 892-1162   ° °Agencies that provide inexpensive medical care: °Organization         Address  Phone   Notes  °[facility]  (336) 832-8035   °[facility]    (336) 832-7272   °[facility] [address] °[place], [place] [address] (336) 832-4777   °[facility] [address], °[place] (336) 271-4999   °[facility]    (336) 373-0678   °[facility]    (336) 272-1050   °[facility] ° [address], [place] Phone:  (336) 832-4444, Fax:  (336) 832-4440 Hours of Operation:  9 am - 6 pm, M-F.  Also accepts Medicaid/Medicare and self-pay.  °[facility] ° [address], [place] Phone: (336) 832-3150, Fax: (336) [phone]. Hours of Operation:  8:30 am - 5:30 pm, M-F.  Also accepts Medicaid and self-pay.  °[facility] [place] [address], [place] Phone: (336) 878-6027   °[facility] [address], [place], [place] (336)723-1848, Ext. 123 Mondays & Thursdays: 7-9 AM.  First 15 patients are seen on a first come, first serve basis. °  ° °Medicaid-accepting [place] Providers: ° °Organization         Address  Phone   Notes  °[facility] [address], [place] (336) 641-2100 Also accepts self-pay patients.  °[facility] [address], [place] ° (336) 856-9996   °[facility] [address], [place]  (336) 288-8857   °[facility] [address] [place] [address], [place] (336) 299-7000   °Veita Bland [address], [place]  ° (336) 373-1557 Only accepts [place] Access Medicaid patients after they have their name applied to their card.  ° °Self-Pay (no insurance) in [place]: ° °Organization         Address  Phone   Notes  °Sickle Cell Patients, [facility] [address], [place] (336) 832-1970   °[facility] [address], [place] (336) 832-4400   °[facility] ° [address], [place] (336) 992-4800   °[facility]/Dr. Osei-Bonsu ° [address] [place] [address], [place] or [address], [place] (336) [phone] Phone number for both [place] and [place] locations is the same.  °[facility] [address], [place] (336) 299-0000   °[facility] [address] [place] [address], [place] or [address] (336) 852-7530 °(336) 878-2260   °[facility] [address], [place] (336) 350-1642, phone; (336) 294-5005, fax Sees patients 1st and 3rd Saturday of every month.  Must not qualify for public or private insurance (i.e. Medicaid, Medicare, [place] Health Choice, Veterans' Benefits) • Household income should be no more than 200% of the poverty level •The clinic cannot treat you if you are pregnant or think you are pregnant • Sexually transmitted diseases are not treated at the clinic.  ° ° °Dental Care: °Organization         Address  Phone  Notes  °[place] [facility] [address], [place] (336) 641-6152 Accepts children up to age 21 who are enrolled in Medicaid or [place] Health Choice; pregnant women with a Medicaid card; and children who have applied for Medicaid or [place] Health Choice, but were declined, whose parents can pay a reduced fee at time of service.  °[place]  [facility] [place]  [address], [place] (336) 641-7733 Accepts children up to age 21 who are enrolled in Medicaid or [place] Health Choice; pregnant women with a Medicaid card; and children who have applied for Medicaid or [place] Health Choice, but were declined, whose parents can pay a reduced fee at time of service.  °[facility] ° [address], [place] (336) 641-4533 Patients are seen by appointment only. Walk-ins are not accepted. [facility] will see patients 18 years of age and older. °Monday - Tuesday (8am-5pm) °Most Wednesdays (8:30-5pm) °$30 per visit, cash only  °[facility] ° [address], [place] (336) 641-4533 Patients are seen by appointment only. Walk-ins are not accepted. [facility] will see patients 18 years of age and older. °One   Wednesday Evening (Monthly: Volunteer Based).  $30 per visit, cash only  °[facility]  (919) 537-3737 for adults; Children under age 4, call Graduate Pediatric Dentistry at ([phone]) [phone]. Children aged 4-14, please call (919) [phone] to request a pediatric application. ° Dental services are provided in all areas of dental care including fillings, crowns and bridges, complete and partial dentures, implants, gum treatment, root canals, and extractions. Preventive care is also provided. Treatment is provided to both adults and children. °Patients are selected via a lottery and there is often a waiting list. °  °[facility] [address], °[place] ° (336) 763-8833 [email or web address] °  °[facility] [address], [place], [place] (336)723-1848, Ext. 123 Second and Fourth Thursday of each month, opens at 6:30 AM; Clinic ends at 9 AM.  Patients are seen on a first-come first-served basis, and a limited number are seen during each clinic.  ° °[facility] ° [address], [place], [place] (336) 723-7904    Eligibility Requirements °You must have lived in [place], [place], or [place] counties for at least the last three months. °  You cannot be eligible for state or federal sponsored healthcare insurance, including Veterans Administration, Medicaid, or Medicare. °  You generally cannot be eligible for healthcare insurance through your employer.  °  How to apply: °Eligibility screenings are held every Tuesday and Wednesday afternoon from 1:00 pm until 4:00 pm. You do not need an appointment for the interview!  °[facility] [address], [place], [place] 336-631-2330   °[facility]  336-342-8273   °[facility]  336-703-3100   °[facility]  336-570-6415   ° °Behavioral Health Resources in the Community: °Intensive Outpatient Programs °Organization         Address  Phone  Notes  °[facility] [address], [place], [place] 336-878-6098   °[facility] [address], [place], [place] 336-832-9800   °[facility] [address], [place], [place] ° 336-882-2125   °[facility] [address],  °[place], [place] 1-800-853-5163 or 336-641-4981   °Substance Abuse Resources °Organization         Address  Phone  Notes  °[facility]  336-882-2125   °[facility]  336-784-9470   °[facility]  336-285-9073   °[facility]  336-845-3988   °Residential & Outpatient Substance Abuse Program  1-800-659-3381   °Psychological Services °Organization         Address  Phone  Notes  °[facility]  336- 832-9600   °[facility]  336- 378-7881   °[facility] [address], [place] 1-800-853-5163 or 336-641-4981   ° °Mobile Crisis Teams °Organization         Address  Phone  Notes  °[facility], Mobile Crisis Care Unit  1-877-626-1772   °Assertive °[facility] ° [address]  [place], [place] 336-834-9664   °Sharon DeEsch [address] °[place] 336-554-5454   ° °Self-Help/Support Groups °Organization         Address  Phone             Notes  °[facility]  - variety of support groups  336- 373-1402 Call for more information  °Narcotics Anonymous (NA), [facility] [address], °[place]  2 meetings at this location  ° °  Residential Treatment Programs Organization         Address  Phone  Notes  [facility] [address],    [place]  [phone]   [facility]  [address], [place] [phone], [place], [place] [phone]   [facility] [address], [place] [phone] Admissions: 8am-3pm M-F  [facility] [address]    [place], [place] [phone]   [facility] [address], [place], [place] [phone]   [facility] [address]  [place], [place] [phone](947) [phone]   [facility] Intensive Outpatient [address], [place], [place] 643-329-5188([phone]   [facility] [address]  [place], [place] [phone] or (713)098-5717(231)[phone]   [facility] [address] [place], [place] 220-254-2706([phone])[phone] Accepts Medicaid  [facility] [address]  [place] [phone] Substance Abuse/Addiction Treatment   [facility]         Address  Phone  Notes  [facility]  (716)139-7622(888) (807) 834-3583   Angie FavaJulie Brannon, PhD [address] [place], [place]   [phone](336) (203)[phone] or 7621495501(336) (831) [phone]   [facility]   [address] [place], [place] [phone](336) [phone]   [facility] [address], [place], [place] [phone](336) [phone] Insurance/Medicaid/sponsorship through [facility] [address]                                    [place], [place] ([phone](336) [phone] Therapy/tele-psych/case    [facility] [address]   [place], [place] [phone](336) [phone]    Dr. Lolly MustacheArfeen  (617) [phone](336) [phone]   [facility] [place]  [employer] [facility] 1) [address], [place] 2) [address], [place] 3)  [address], [place] ([phone]) [phone](336) [phone]) [phone](336) [phone]  ([phone]) [phone](336) [phone]   [facility] Child Abuse Hotline 856 546 3703(336) 564-135-5613 or [phone](336) [phone] (After Hours)      Take the prescription as directed.  Call your regular medical doctor and the ENT doctor on Tuesday to schedule a follow up appointment this week.  Return to the Emergency Department immediately sooner if worsening.

## 2015-05-20 NOTE — ED Notes (Signed)
Patient with no complaints at this time. Respirations even and unlabored. Skin warm/dry. Discharge instructions reviewed with patient at this time. Patient given opportunity to voice concerns/ask questions. Patient discharged at this time and left Emergency Department with steady gait.   

## 2015-05-20 NOTE — ED Provider Notes (Signed)
CSN: [id number]     Arrival date & time 05/20/15  [id number] History   First MD Initiated Contact with Patient 05/20/15 [phone]     Chief Complaint  Patient presents with  . Dizziness      HPI  Pt was seen at 1000. Per pt, c/o gradual onset and persistence of constant acute flair of his chronic "dizziness" since 1900 yesterday. Pt describes his dizziness as "everything is moving around," "I feel like I'm drunk," and per his previous symptom pattern. Denies CP/palpitations, no SOB/cough, no abd pain, no N/V/D, no headache, no visual changes, no focal motor weakness, no tingling/numbness in extremities, no injury, no falls.  Denies any change from his previous symptoms. The symptoms have been associated with no other complaints. The patient has a significant history of similar symptoms previously, recently being evaluated for this complaint and multiple prior evals for same.      Past Medical History  Diagnosis Date  . Stroke   . Hypertension   . Chronic pain   . Headache   . Dizziness    History reviewed. No pertinent past surgical history.   Family History  Problem Relation Age of Onset  . Stroke Father   . Heart failure Father    History  Substance Use Topics  . Smoking status: Current Every Day Smoker -- 0.50 packs/day for 27 years    Types: Cigarettes  . Smokeless tobacco: Never Used  . Alcohol Use: No    Review of Systems ROS: Statement: All systems negative except as marked or noted in the HPI; Constitutional: Negative for fever and chills. ; ; Eyes: Negative for eye pain, redness and discharge. ; ; ENMT: Negative for ear pain, hoarseness, nasal congestion, sinus pressure and sore throat. ; ; Cardiovascular: Negative for chest pain, palpitations, diaphoresis, dyspnea and peripheral edema. ; ; Respiratory: Negative for cough, wheezing and stridor. ; ; Gastrointestinal: Negative for nausea, vomiting, diarrhea, abdominal pain, blood in stool, hematemesis, jaundice and rectal bleeding.  . ; ; Genitourinary: Negative for dysuria, flank pain and hematuria. ; ; Musculoskeletal: Negative for back pain and neck pain. Negative for swelling and trauma.; ; Skin: Negative for pruritus, rash, abrasions, blisters, bruising and skin lesion.; ; Neuro: +"dizziness," "everything is moving." Negative for headache, lightheadedness and neck stiffness. Negative for weakness, altered level of consciousness , altered mental status, extremity weakness, paresthesias, involuntary movement, seizure and syncope.      Allergies  Codeine and Penicillins  Home Medications   Prior to Admission medications   Medication Sig Start Date End Date Taking? Authorizing Provider  alprazolam Prudy Feeler(XANAX) 2 MG tablet Take 2 mg by mouth 4 (four) times daily.   Yes Historical Provider, MD  aspirin EC 81 MG tablet Take 81 mg by mouth daily.     Yes Historical Provider, MD  cyclobenzaprine (FLEXERIL) 10 MG tablet Take 10 mg by mouth 3 (three) times daily.    Yes Historical Provider, MD  albuterol (PROVENTIL HFA;VENTOLIN HFA) 108 (90 BASE) MCG/ACT inhaler Inhale 2 puffs into the lungs every 6 (six) hours as needed. For shortness of breath    Historical Provider, MD  clindamycin (CLEOCIN) 150 MG capsule Take 2 capsules (300 mg total) by mouth 4 (four) times daily. For 7 days Patient not taking: Reported on 05/20/2015 12/21/14   Tammy Triplett, PA-C  hydrOXYzine (ATARAX/VISTARIL) 25 MG tablet Take 1 tablet (25 mg total) by mouth every 8 (eight) hours as needed for anxiety. Patient not taking: Reported on 10/24/2014 09/07/14  Rolland Porter, MD  naproxen (NAPROSYN) 500 MG tablet Take 1 tablet (500 mg total) by mouth 2 (two) times daily. Patient not taking: Reported on 05/20/2015 12/21/14   Tammy Triplett, PA-C   BP 131/87 mmHg  Pulse 76  Temp(Src) 97.5 F (36.4 C) (Oral)  Resp 16  Ht  (1.753 m)  Wt 196 lb (88.905 kg)  BMI 28.93 kg/m2  SpO2 100% Physical Exam  1005: Physical examination:  Nursing notes reviewed; Vital  signs and O2 SAT reviewed;  Constitutional: Well developed, Well nourished, Well hydrated, In no acute distress; Head:  Normocephalic, atraumatic; Eyes: EOMI, PERRL, No scleral icterus; ENMT: TM's clear bilat. +edemetous nasal turbinates bilat with clear rhinorrhea. Mouth and pharynx normal, Mucous membranes moist; Neck: Supple, Full range of motion, No lymphadenopathy; Cardiovascular: Regular rate and rhythm, No murmur, rub, or gallop; Respiratory: Breath sounds clear & equal bilaterally, No rales, rhonchi, wheezes.  Speaking full sentences with ease, Normal respiratory effort/excursion; Chest: Nontender, Movement normal; Abdomen: Soft, Nontender, Nondistended, Normal bowel sounds; Genitourinary: No CVA tenderness; Extremities: Pulses normal, No tenderness, No edema, No calf edema or asymmetry.; Neuro: AA&Ox3, Major CN grossly intact. Speech clear.  No facial droop. +left horizontal end gaze fatigable nystagmus which reproduces pt's symptoms. Grips equal. Strength 5/5 equal bilat UE's and LE's.  DTR 2/4 equal bilat UE's and LE's.  No gross sensory deficits.  Normal cerebellar testing bilat UE's (finger-nose) and LE's (heel-shin). Climbs on and off stretcher easily by himself. Gait steady.; Skin: Color normal, Warm, Dry.   ED Course  Procedures     EKG Interpretation None      MDM  MDM Reviewed: previous chart, nursing note and vitals Interpretation: CT scan      Ct Head Wo Contrast 05/20/2015   CLINICAL DATA:  Headache and dizziness since yesterday.  EXAM: CT HEAD WITHOUT CONTRAST  TECHNIQUE: Contiguous axial images were obtained from the base of the skull through the vertex without intravenous contrast.  COMPARISON:  08/02/2014  FINDINGS: Ventricles, cisterns and other CSF spaces are within normal. No focal mass, mass effect, shift of midline structures or acute hemorrhage. No evidence of acute infarction. Subtle opacification of the ethmoid sinus unchanged. Remaining bones and soft tissues are  within normal.  IMPRESSION: No acute intracranial findings.   Electronically Signed   By: Elberta Fortis M.D.   On: 05/20/2015 11:18    1145:  Pt specifically denies headache to me.  Pt has ambulated with steady gait, easy resps, NAD. Pt has tol PO well without N/V while in the ED.  Pt feels improved after meds and wants to go home now. Will continue to tx symptomatically at this time. Dx and testing d/w pt and family.  Questions answered.  Verb understanding, agreeable to d/c home with outpt f/u.   Rylyn Jester, DO 05/24/15 Babette Relic

## 2015-05-20 NOTE — ED Notes (Addendum)
Pt co dizziness since 1900 yesterday. Denies N,V. Equal grip strength, no deficits observed. Pt also co headache that started at 1900 yesterday as well.

## 2015-07-19 ENCOUNTER — Encounter (HOSPITAL_COMMUNITY): Payer: Self-pay | Admitting: Emergency Medicine

## 2015-07-19 ENCOUNTER — Emergency Department (HOSPITAL_COMMUNITY)
Admission: EM | Admit: 2015-07-19 | Discharge: 2015-07-19 | Disposition: A | Discharge status: Home / Self Care | Payer: Medicaid Other | Attending: Emergency Medicine | Admitting: Emergency Medicine

## 2015-07-19 DIAGNOSIS — I10 Essential (primary) hypertension: Secondary | ICD-10-CM | POA: Diagnosis not present

## 2015-07-19 DIAGNOSIS — Z7982 Long term (current) use of aspirin: Secondary | ICD-10-CM | POA: Insufficient documentation

## 2015-07-19 DIAGNOSIS — Z8673 Personal history of transient ischemic attack (TIA), and cerebral infarction without residual deficits: Secondary | ICD-10-CM | POA: Diagnosis not present

## 2015-07-19 DIAGNOSIS — K088 Other specified disorders of teeth and supporting structures: Secondary | ICD-10-CM | POA: Diagnosis present

## 2015-07-19 DIAGNOSIS — G8929 Other chronic pain: Secondary | ICD-10-CM | POA: Insufficient documentation

## 2015-07-19 DIAGNOSIS — Z79899 Other long term (current) drug therapy: Secondary | ICD-10-CM | POA: Diagnosis not present

## 2015-07-19 DIAGNOSIS — Z880 Allergy status to penicillin: Secondary | ICD-10-CM | POA: Diagnosis not present

## 2015-07-19 DIAGNOSIS — K029 Dental caries, unspecified: Secondary | ICD-10-CM | POA: Diagnosis not present

## 2015-07-19 DIAGNOSIS — Z720 Tobacco use: Secondary | ICD-10-CM | POA: Insufficient documentation

## 2015-07-19 DIAGNOSIS — K0889 Other specified disorders of teeth and supporting structures: Secondary | ICD-10-CM

## 2015-07-19 MED ORDER — CLINDAMYCIN HCL 150 MG PO CAPS: 300.0000 mg | ORAL_CAPSULE | Freq: Four times a day (QID) | ORAL | Status: DC

## 2015-07-19 MED ORDER — NAPROXEN 500 MG PO TABS: 500.0000 mg | ORAL_TABLET | Freq: Two times a day (BID) | ORAL | Status: DC

## 2015-07-19 MED ORDER — CLINDAMYCIN HCL 150 MG PO CAPS
300.0000 mg | ORAL_CAPSULE | Freq: Once | ORAL | Status: DC
  Filled 2015-07-19: qty 2

## 2015-07-19 MED ORDER — HYDROCODONE-ACETAMINOPHEN 5-325 MG PO TABS
1.0000 | ORAL_TABLET | Freq: Once | ORAL | Status: AC
  Administered 2015-07-19: 1 via ORAL
  Filled 2015-07-19: qty 1

## 2015-07-19 NOTE — ED Notes (Signed)
PT c/o right upper dental pain starting this am around 1000 and stated a piece of his tooth broke off while eating breakfast. PT states no relief from goody powders.

## 2015-07-19 NOTE — ED Provider Notes (Signed)
CSN: [id number]     Arrival date & time 07/19/15  1738 History   First MD Initiated Contact with Patient 07/19/15 1744     Chief Complaint  Patient presents with  . Dental Pain     (Consider location/radiation/quality/duration/timing/severity/associated sxs/prior Treatment) HPI  Gary Harrington is a 54 y.o. male who presents to the Emergency Department complaining of dental pain.  He states that a right upper tooth "broke off" this morning while eating breakfast. He describes a sharp, throbbing pain to his right face. Pain is worse with cold fluids or food and with chewing. He states that he is waiting for approval for his Medicaid to see a dentist. He denies fever, chills, neck pain, difficulty swallowing or bleeding of his gums.   Past Medical History  Diagnosis Date  . Stroke   . Hypertension   . Chronic pain   . Headache   . Dizziness    Past Surgical History  Procedure Laterality Date  . Tonsillectomy     Family History  Problem Relation Age of Onset  . Stroke Father   . Heart failure Father    History  Substance Use Topics  . Smoking status: Current Every Day Smoker -- 0.50 packs/day for 27 years    Types: Cigarettes  . Smokeless tobacco: Never Used  . Alcohol Use: No    Review of Systems  Constitutional: Negative for fever and appetite change.  HENT: Positive for dental problem. Negative for congestion, facial swelling, sore throat and trouble swallowing.   Eyes: Negative for pain and visual disturbance.  Musculoskeletal: Negative for neck pain and neck stiffness.  Neurological: Negative for dizziness, facial asymmetry and headaches.  Hematological: Negative for adenopathy.  All other systems reviewed and are negative.     Allergies  Codeine and Penicillins  Home Medications   Prior to Admission medications   Medication Sig Start Date End Date Taking? Authorizing Provider  albuterol (PROVENTIL HFA;VENTOLIN HFA) 108 (90 BASE) MCG/ACT inhaler Inhale 2  puffs into the lungs every 6 (six) hours as needed. For shortness of breath   Yes Historical Provider, MD  ALPRAZolam Prudy Feeler) 1 MG tablet Take 1 mg by mouth 4 (four) times daily.   Yes Historical Provider, MD  aspirin EC 81 MG tablet Take 81 mg by mouth daily.     Yes Historical Provider, MD  Aspirin-Acetaminophen-Caffeine (GOODY HEADACHE PO) Take 1 packet by mouth every 4 (four) hours.   Yes Historical Provider, MD  clindamycin (CLEOCIN) 150 MG capsule Take 2 capsules (300 mg total) by mouth 4 (four) times daily. For 7 days Patient not taking: Reported on 05/20/2015 12/21/14   Birtha Hatler, PA-C  hydrOXYzine (ATARAX/VISTARIL) 25 MG tablet Take 1 tablet (25 mg total) by mouth every 8 (eight) hours as needed for anxiety. Patient not taking: Reported on 10/24/2014 09/07/14   Rolland Porter, MD  meclizine (ANTIVERT) 25 MG tablet Take 1 tablet (25 mg total) by mouth 3 (three) times daily as needed for dizziness. Patient not taking: Reported on 07/19/2015 05/20/15   Draper Jester, DO  naproxen (NAPROSYN) 500 MG tablet Take 1 tablet (500 mg total) by mouth 2 (two) times daily. Patient not taking: Reported on 05/20/2015 12/21/14   Allona Gondek, PA-C   BP 131/80 mmHg  Pulse 86  Temp(Src) 97.9 F (36.6 C) (Oral)  Resp 16  Ht  (1.753 m)  Wt 220 lb (99.791 kg)  BMI 32.47 kg/m2  SpO2 99% Physical Exam  Constitutional: He is oriented to  person, place, and time. He appears well-developed and well-nourished. No distress.  HENT:  Head: Normocephalic and atraumatic.  Right Ear: Tympanic membrane and ear canal normal.  Left Ear: Tympanic membrane and ear canal normal.  Mouth/Throat: Uvula is midline, oropharynx is clear and moist and mucous membranes are normal. No trismus in the jaw. Dental caries present. No dental abscesses or uvula swelling.  Widespread dental caries, tenderness of the right upper second molar with mild erythema of the associated gums..  No facial swelling, obvious dental abscess,  trismus, or sublingual abnml.    Neck: Normal range of motion. Neck supple.  Cardiovascular: Normal rate, regular rhythm and normal heart sounds.   No murmur heard. Pulmonary/Chest: Effort normal and breath sounds normal. No respiratory distress.  Musculoskeletal: Normal range of motion.  Lymphadenopathy:    He has no cervical adenopathy.  Neurological: He is alert and oriented to person, place, and time. He exhibits normal muscle tone. Coordination normal.  Skin: Skin is warm and dry.  Nursing note and vitals reviewed.   ED Course  Procedures (including critical care time) Labs Review Labs Reviewed - No data to display  Imaging Review No results found.   EKG Interpretation None      MDM   Final diagnoses:  Pain, dental    Patient is well-appearing. Vital signs are stable. No concerning symptoms for Ludwig's angina. Patient has multiple ER visits for dental pain. No concerning symptoms for dental abscess at this time. I will prescribe antibiotic's, he agrees to follow-up with dentistry and understands that narcotics are not indicated at this time.    Pauline Aus, PA-C 07/19/15 1818  Tilden Fossa, MD 07/19/15 [phone]

## 2015-07-19 NOTE — Discharge Instructions (Signed)
Dental Pain  Toothache is pain in or around a tooth. It may get worse with chewing or with cold or heat.   HOME CARE  · Your dentist may use a numbing medicine during treatment. If so, you may need to avoid eating until the medicine wears off. Ask your dentist about this.  · Only take medicine as told by your dentist or doctor.  · Avoid chewing food near the painful tooth until after all treatment is done. Ask your dentist about this.  GET HELP RIGHT AWAY IF:   · The problem gets worse or new problems appear.  · You have a fever.  · There is redness and puffiness (swelling) of the face, jaw, or neck.  · You cannot open your mouth.  · There is pain in the jaw.  · There is very bad pain that is not helped by medicine.  MAKE SURE YOU:   · Understand these instructions.  · Will watch your condition.  · Will get help right away if you are not doing well or get worse.  Document Released: 05/26/2008 Document Revised: 03/01/2012 Document Reviewed: 05/26/2008  [employer]® Patient Information ©2015 [employer]. This information is not intended to replace advice given to you by your health care provider. Make sure you discuss any questions you have with your health care provider.

## 2015-08-07 ENCOUNTER — Emergency Department (HOSPITAL_COMMUNITY)
Admission: EM | Admit: 2015-08-07 | Discharge: 2015-08-07 | Disposition: A | Discharge status: Home / Self Care | Payer: Medicaid Other | Attending: Emergency Medicine | Admitting: Emergency Medicine

## 2015-08-07 ENCOUNTER — Encounter (HOSPITAL_COMMUNITY): Payer: Self-pay | Admitting: Cardiology

## 2015-08-07 DIAGNOSIS — Z791 Long term (current) use of non-steroidal anti-inflammatories (NSAID): Secondary | ICD-10-CM | POA: Diagnosis not present

## 2015-08-07 DIAGNOSIS — Z760 Encounter for issue of repeat prescription: Secondary | ICD-10-CM | POA: Diagnosis not present

## 2015-08-07 DIAGNOSIS — Z7982 Long term (current) use of aspirin: Secondary | ICD-10-CM | POA: Diagnosis not present

## 2015-08-07 DIAGNOSIS — Z79899 Other long term (current) drug therapy: Secondary | ICD-10-CM | POA: Insufficient documentation

## 2015-08-07 DIAGNOSIS — Z880 Allergy status to penicillin: Secondary | ICD-10-CM | POA: Insufficient documentation

## 2015-08-07 DIAGNOSIS — I10 Essential (primary) hypertension: Secondary | ICD-10-CM | POA: Insufficient documentation

## 2015-08-07 DIAGNOSIS — Z8673 Personal history of transient ischemic attack (TIA), and cerebral infarction without residual deficits: Secondary | ICD-10-CM | POA: Diagnosis not present

## 2015-08-07 DIAGNOSIS — F419 Anxiety disorder, unspecified: Secondary | ICD-10-CM | POA: Diagnosis present

## 2015-08-07 DIAGNOSIS — K088 Other specified disorders of teeth and supporting structures: Secondary | ICD-10-CM | POA: Diagnosis not present

## 2015-08-07 DIAGNOSIS — G8929 Other chronic pain: Secondary | ICD-10-CM | POA: Diagnosis not present

## 2015-08-07 DIAGNOSIS — Z720 Tobacco use: Secondary | ICD-10-CM | POA: Diagnosis not present

## 2015-08-07 MED ORDER — ALPRAZOLAM 0.5 MG PO TABS
1.0000 mg | ORAL_TABLET | Freq: Once | ORAL | Status: AC
  Administered 2015-08-07: 1 mg via ORAL
  Filled 2015-08-07: qty 2

## 2015-08-07 NOTE — ED Notes (Signed)
Out of xanax for 8 days.  Wants a refill till sees his doctor on Monday.

## 2015-08-07 NOTE — Discharge Instructions (Signed)
You were treated with a Xanax tablet In the emergency department. This prescription should be filled by a primary physician or specialist.

## 2015-08-07 NOTE — ED Provider Notes (Signed)
CSN: [id number]     Arrival date & time 08/07/15  1446 History   First MD Initiated Contact with Patient 08/07/15 1525     Chief Complaint  Patient presents with  . Anxiety     (Consider location/radiation/quality/duration/timing/severity/associated sxs/prior Treatment) HPI Comments: Patient is a 54 year old meal presents to the emergency department with the complaint of "I'm feeling very nervous, and I need my Xanax refilled".  The patient states that he recently got his Medicaid restored, he has a appointment with a primary physician on next week, but he is out of Xanax. The patient cannot give me the name of the doctor that he received Xanax from. He is requesting a refill on Xanax to help him with his anxiety, until he can be seen by his new Medicaid physician. The patient is not had any hallucinations. He denies suicidal or homicidal ideations. He has not had nausea, vomiting, or unusual weakness. The patient does state he has headache from time to time.  Patient is a 54 y.o. male presenting with anxiety. The history is provided by the patient.  Anxiety Associated symptoms include headaches.    Past Medical History  Diagnosis Date  . Stroke   . Hypertension   . Chronic pain   . Headache   . Dizziness    Past Surgical History  Procedure Laterality Date  . Tonsillectomy     Family History  Problem Relation Age of Onset  . Stroke Father   . Heart failure Father    Social History  Substance Use Topics  . Smoking status: Current Every Day Smoker -- 0.50 packs/day for 27 years    Types: Cigarettes  . Smokeless tobacco: Never Used  . Alcohol Use: No    Review of Systems  HENT: Positive for dental problem.   Neurological: Positive for headaches.  Psychiatric/Behavioral: The patient is nervous/anxious.   All other systems reviewed and are negative.     Allergies  Codeine and Penicillins  Home Medications   Prior to Admission medications   Medication Sig Start  Date End Date Taking? Authorizing Provider  albuterol (PROVENTIL HFA;VENTOLIN HFA) 108 (90 BASE) MCG/ACT inhaler Inhale 2 puffs into the lungs every 6 (six) hours as needed. For shortness of breath    Historical Provider, MD  ALPRAZolam Prudy Feeler) 1 MG tablet Take 1 mg by mouth 4 (four) times daily.    Historical Provider, MD  aspirin EC 81 MG tablet Take 81 mg by mouth daily.      Historical Provider, MD  Aspirin-Acetaminophen-Caffeine (GOODY HEADACHE PO) Take 1 packet by mouth every 4 (four) hours.    Historical Provider, MD  clindamycin (CLEOCIN) 150 MG capsule Take 2 capsules (300 mg total) by mouth 4 (four) times daily. For 7 days 07/19/15   Tammy Triplett, PA-C  hydrOXYzine (ATARAX/VISTARIL) 25 MG tablet Take 1 tablet (25 mg total) by mouth every 8 (eight) hours as needed for anxiety. Patient not taking: Reported on 10/24/2014 09/07/14   Rolland Porter, MD  meclizine (ANTIVERT) 25 MG tablet Take 1 tablet (25 mg total) by mouth 3 (three) times daily as needed for dizziness. Patient not taking: Reported on 07/19/2015 05/20/15   Hamsa Jester, DO  naproxen (NAPROSYN) 500 MG tablet Take 1 tablet (500 mg total) by mouth 2 (two) times daily with a meal. 07/19/15   Tammy Triplett, PA-C   BP 123/64 mmHg  Pulse 56  Temp(Src) 98.3 F (36.8 C) (Oral)  Resp 18  Ht  (1.753 m)  Wt  210 lb (95.255 kg)  BMI 31.00 kg/m2  SpO2 98% Physical Exam  Constitutional: He is oriented to person, place, and time. He appears well-developed and well-nourished.  Non-toxic appearance.  HENT:  Head: Normocephalic.  Right Ear: Tympanic membrane and external ear normal.  Left Ear: Tympanic membrane and external ear normal.  Poor dentition  Eyes: EOM and lids are normal. Pupils are equal, round, and reactive to light.  Neck: Normal range of motion. Neck supple. Carotid bruit is not present.  Cardiovascular: Normal rate, regular rhythm, normal heart sounds, intact distal pulses and normal pulses.   Pulmonary/Chest: Breath  sounds normal. No respiratory distress.  Abdominal: Soft. Bowel sounds are normal. There is no tenderness. There is no guarding.  Musculoskeletal: Normal range of motion.  Lymphadenopathy:       Head (right side): No submandibular adenopathy present.       Head (left side): No submandibular adenopathy present.    He has no cervical adenopathy.  Neurological: He is alert and oriented to person, place, and time. He has normal strength. No cranial nerve deficit or sensory deficit.  Skin: Skin is warm and dry.  Psychiatric: He has a normal mood and affect. His speech is normal.  No suicidal or homicidal ideations.  Nursing note and vitals reviewed.   ED Course  Procedures (including critical care time) Labs Review Labs Reviewed - No data to display  Imaging Review No results found. I have personally reviewed and evaluated these images and lab results as part of my medical decision-making.   EKG Interpretation None      MDM  Vital signs are within normal limits. There no acute exam changes at this time. The patient denies suicidal or homicidal ideations.  I discussed with the patient that this type of prescription will need to be prescribed by his primary physician, or his specialist. The patient was given a 1 mg Xanax here in the emergency department. The patient acknowledges understanding of this discharge plan, and is in agreement.    Final diagnoses:  None    *I have reviewed nursing notes, vital signs, and all appropriate lab and imaging results for this patient.Ivery Quale, PA-C 08/07/15 1610  Gilda Crease, MD 08/07/15 1723

## 2015-08-12 ENCOUNTER — Emergency Department (HOSPITAL_COMMUNITY)
Admission: EM | Admit: 2015-08-12 | Discharge: 2015-08-12 | Disposition: A | Discharge status: Home / Self Care | Payer: Medicaid Other | Attending: Emergency Medicine | Admitting: Emergency Medicine

## 2015-08-12 ENCOUNTER — Encounter (HOSPITAL_COMMUNITY): Payer: Self-pay | Admitting: Emergency Medicine

## 2015-08-12 DIAGNOSIS — Z79899 Other long term (current) drug therapy: Secondary | ICD-10-CM | POA: Insufficient documentation

## 2015-08-12 DIAGNOSIS — Z8673 Personal history of transient ischemic attack (TIA), and cerebral infarction without residual deficits: Secondary | ICD-10-CM | POA: Diagnosis not present

## 2015-08-12 DIAGNOSIS — G8929 Other chronic pain: Secondary | ICD-10-CM | POA: Insufficient documentation

## 2015-08-12 DIAGNOSIS — Z7982 Long term (current) use of aspirin: Secondary | ICD-10-CM | POA: Diagnosis not present

## 2015-08-12 DIAGNOSIS — R079 Chest pain, unspecified: Secondary | ICD-10-CM | POA: Diagnosis not present

## 2015-08-12 DIAGNOSIS — Z760 Encounter for issue of repeat prescription: Secondary | ICD-10-CM | POA: Insufficient documentation

## 2015-08-12 DIAGNOSIS — Z880 Allergy status to penicillin: Secondary | ICD-10-CM | POA: Diagnosis not present

## 2015-08-12 DIAGNOSIS — Z720 Tobacco use: Secondary | ICD-10-CM | POA: Insufficient documentation

## 2015-08-12 DIAGNOSIS — I10 Essential (primary) hypertension: Secondary | ICD-10-CM | POA: Diagnosis not present

## 2015-08-12 LAB — TROPONIN I: Troponin I: <0.03 ng/mL (ref <0.031)

## 2015-08-12 MED ORDER — ALPRAZOLAM 0.5 MG PO TABS
1.0000 mg | ORAL_TABLET | Freq: Once | ORAL | Status: AC
  Administered 2015-08-12: 1 mg via ORAL
  Filled 2015-08-12: qty 2

## 2015-08-12 NOTE — ED Provider Notes (Signed)
CSN: [id number]     Arrival date & time 08/12/15  1019 History  This chart was scribed for non-physician practitioner Pauline Aus, PA-C working with Tellis Jester, DO by Lyndel Safe, ED Scribe. This patient was seen in room APFT21/APFT21 and the patient's care was started at 11:43 AM.    Chief Complaint  Patient presents with  . Medication Refill   The history is provided by the patient. No language interpreter was used.   HPI Comments: Gary Harrington is a 54 y.o. male, with a PMhx of CVA, HTN, and anxiety, who presents to the Emergency Department requesting a refill on his Xanax prescription. Pt reports he has been out of his Xanax prescription for 8 days now. He has not had any Xanax since he was seen 6 days ago in the ED. Pt was evaluated in the ED 6 days ago when he stated he was feeling nervous and needed a refill on his Xanax prescription,  After receiving 1mg  Xanax pt was stable for discharge on this ED visit. He has an appointment at 8 am tomorrow at [facility] in [place]. Pt additionally complains of a constant, moderate centralized chest tightness that is exacerbated with deep breathing onset 1 day ago. He notes he has experienced this chest tightness before when he has been out of his Xanax prescription. Denies experiencing seizures, fever, nausea, vomiting or SOB.    Past Medical History  Diagnosis Date  . Stroke   . Hypertension   . Chronic pain   . Headache   . Dizziness    Past Surgical History  Procedure Laterality Date  . Tonsillectomy     Family History  Problem Relation Age of Onset  . Stroke Father   . Heart failure Father    Social History  Substance Use Topics  . Smoking status: Current Every Day Smoker -- 0.50 packs/day for 27 years    Types: Cigarettes  . Smokeless tobacco: Never Used  . Alcohol Use: No    Review of Systems  Constitutional: Negative for fever.  Respiratory: Negative for chest tightness and shortness of breath.    Cardiovascular: Positive for chest pain.  Gastrointestinal: Negative for nausea and vomiting.  Neurological: Negative for dizziness, seizures, syncope and weakness.  All other systems reviewed and are negative.  Allergies  Codeine and Penicillins  Home Medications   Prior to Admission medications   Medication Sig Start Date End Date Taking? Authorizing Provider  albuterol (PROVENTIL HFA;VENTOLIN HFA) 108 (90 BASE) MCG/ACT inhaler Inhale 2 puffs into the lungs every 6 (six) hours as needed. For shortness of breath    Historical Provider, MD  ALPRAZolam Prudy Feeler) 1 MG tablet Take 1 mg by mouth 4 (four) times daily.    Historical Provider, MD  aspirin EC 81 MG tablet Take 81 mg by mouth daily.      Historical Provider, MD  Aspirin-Acetaminophen-Caffeine (GOODY HEADACHE PO) Take 1 packet by mouth every 4 (four) hours.    Historical Provider, MD  clindamycin (CLEOCIN) 150 MG capsule Take 2 capsules (300 mg total) by mouth 4 (four) times daily. For 7 days Patient not taking: Reported on 08/07/2015 07/19/15   Eular Panek, PA-C  hydrOXYzine (ATARAX/VISTARIL) 25 MG tablet Take 1 tablet (25 mg total) by mouth every 8 (eight) hours as needed for anxiety. Patient not taking: Reported on 10/24/2014 09/07/14   Rolland Porter, MD  meclizine (ANTIVERT) 25 MG tablet Take 1 tablet (25 mg total) by mouth 3 (three) times daily as needed for  dizziness. Patient not taking: Reported on 07/19/2015 05/20/15   Daelin Jester, DO  naproxen (NAPROSYN) 500 MG tablet Take 1 tablet (500 mg total) by mouth 2 (two) times daily with a meal. Patient not taking: Reported on 08/07/2015 07/19/15   Lancelot Alyea, PA-C   BP 116/69 mmHg  Pulse 75  Temp(Src) 97.8 F (36.6 C) (Oral)  Resp 21  Ht  (1.753 m)  Wt 220 lb (99.791 kg)  BMI 32.47 kg/m2  SpO2 100% Physical Exam  Constitutional: He appears well-developed and well-nourished. No distress.  HENT:  Head: Normocephalic.  Eyes: Conjunctivae are normal.  Neck: No JVD  present.  Cardiovascular: Normal rate, regular rhythm and normal heart sounds.   Pulmonary/Chest: Effort normal and breath sounds normal. No respiratory distress. He exhibits tenderness.  Reproducible with palpation to substernal area.   Neurological: He is alert. Coordination normal.  Skin: Skin is warm. No rash noted. No erythema. No pallor.  Psychiatric: He has a normal mood and affect. His behavior is normal.  Nursing note and vitals reviewed.   ED Course  Procedures  DIAGNOSTIC STUDIES: Oxygen Saturation is 100% on RA, normal by my interpretation.    COORDINATION OF CARE: 11:46 AM Discussed treatment plan with pt. Discussed unremarkable troponin lab and EKG with pt. Will order  alprazolam. Pt acknowledges and agrees to plan.    Labs Review Labs Reviewed  TROPONIN I  Rosezena Sensor, ED    Imaging Review   EKG Interpretation   Date/Time:  Sunday August 12 2015 10:39:13 EDT Ventricular Rate:  69 PR Interval:  170 QRS Duration: 86 QT Interval:  398 QTC Calculation: 426 R Axis:   55 Text Interpretation:  Normal sinus rhythm Normal ECG When compared with  ECG of 10/24/2014 No significant change was found Confirmed by [facility]  MD,  Nicholos Johns ([id number]) on 08/12/2015 10:48:42 AM      MDM   Final diagnoses:  Encounter for medication refill    Pt is well appearing.  No clinical sx's for withdrawal.  I have given pt 1 mg xanax here and he understands that no Rx's will be written.  He states that he has an appt with behavioral health clinic tomorrow  Vitals stable.  PERC neg  Mild mid chest tenderness, reproduced with palpation.  EKG and trop reviewed and considered in my MDM  I personally performed the services described in this documentation, which was scribed in my presence. The recorded information has been reviewed and is accurate.     Pauline Aus, PA-C 08/14/15 1003  Kalmen Jester, DO 08/14/15 [phone]

## 2015-08-12 NOTE — ED Notes (Signed)
Patient wanting xanax. States appointment at 0800 tomorrow at [facility] for Rx.

## 2015-08-12 NOTE — ED Notes (Signed)
Patient states his "heart has been hurting all night". EKG obtained.

## 2015-09-02 ENCOUNTER — Emergency Department (HOSPITAL_COMMUNITY)
Admission: EM | Admit: 2015-09-02 | Discharge: 2015-09-02 | Disposition: A | Discharge status: Home / Self Care | Payer: Medicaid Other | Attending: Emergency Medicine | Admitting: Emergency Medicine

## 2015-09-02 ENCOUNTER — Encounter (HOSPITAL_COMMUNITY): Payer: Self-pay | Admitting: *Deleted

## 2015-09-02 DIAGNOSIS — Z7982 Long term (current) use of aspirin: Secondary | ICD-10-CM | POA: Insufficient documentation

## 2015-09-02 DIAGNOSIS — R21 Rash and other nonspecific skin eruption: Secondary | ICD-10-CM | POA: Diagnosis not present

## 2015-09-02 DIAGNOSIS — I10 Essential (primary) hypertension: Secondary | ICD-10-CM | POA: Insufficient documentation

## 2015-09-02 DIAGNOSIS — Z79899 Other long term (current) drug therapy: Secondary | ICD-10-CM | POA: Insufficient documentation

## 2015-09-02 DIAGNOSIS — Z8673 Personal history of transient ischemic attack (TIA), and cerebral infarction without residual deficits: Secondary | ICD-10-CM | POA: Diagnosis not present

## 2015-09-02 DIAGNOSIS — Z720 Tobacco use: Secondary | ICD-10-CM | POA: Insufficient documentation

## 2015-09-02 DIAGNOSIS — G8929 Other chronic pain: Secondary | ICD-10-CM | POA: Diagnosis not present

## 2015-09-02 DIAGNOSIS — Z880 Allergy status to penicillin: Secondary | ICD-10-CM | POA: Insufficient documentation

## 2015-09-02 MED ORDER — HYDROXYZINE HCL 25 MG PO TABS: 25.0000 mg | ORAL_TABLET | Freq: Four times a day (QID) | ORAL | Status: DC

## 2015-09-02 MED ORDER — HYDROXYZINE HCL 25 MG PO TABS
50.0000 mg | ORAL_TABLET | Freq: Once | ORAL | Status: AC
  Administered 2015-09-02: 50 mg via ORAL
  Filled 2015-09-02: qty 2

## 2015-09-02 MED ORDER — PREDNISONE 50 MG PO TABS
60.0000 mg | ORAL_TABLET | Freq: Once | ORAL | Status: AC
  Administered 2015-09-02: 60 mg via ORAL
  Filled 2015-09-02 (×2): qty 1

## 2015-09-02 MED ORDER — PREDNISONE 10 MG PO TABS: ORAL_TABLET | ORAL | Status: DC

## 2015-09-02 NOTE — ED Provider Notes (Signed)
CSN: [id number]     Arrival date & time 09/02/15  1841 History  This chart was scribed for non-physician practitioner, Ivery Quale, PA-C, working with Bethann Berkshire, MD by Marica Otter, ED Scribe. This patient was seen in room APFT20/APFT20 and the patient's care was started at 7:24 PM.   Chief Complaint  Patient presents with  . Rash   HPI PCP: No PCP Per Patient HPI Comments: Gary Harrington is a 54 y.o. male, with PMHx noted below, who presents to the Emergency Department complaining of a rash to the back onset 2 weeks ago after pt was exposed to high brush and possibly chiggers. Pt denies  any other Sx at this time. Pt reports using oral and topical benadryl at home without relief.   Past Medical History  Diagnosis Date  . Stroke   . Hypertension   . Chronic pain   . Headache   . Dizziness    Past Surgical History  Procedure Laterality Date  . Tonsillectomy     Family History  Problem Relation Age of Onset  . Stroke Father   . Heart failure Father    Social History  Substance Use Topics  . Smoking status: Current Every Day Smoker -- 0.50 packs/day for 27 years    Types: Cigarettes  . Smokeless tobacco: Never Used  . Alcohol Use: No    Review of Systems  Constitutional: Negative for fever and chills.  Skin: Positive for color change and rash (rash to the back).  All other systems reviewed and are negative.  Allergies  Codeine and Penicillins  Home Medications   Prior to Admission medications   Medication Sig Start Date End Date Taking? Authorizing Provider  aspirin EC 81 MG tablet Take 81 mg by mouth daily.     Yes Historical Provider, MD  Aspirin-Acetaminophen-Caffeine (GOODY HEADACHE PO) Take 1 packet by mouth every 4 (four) hours.   Yes Historical Provider, MD  ALPRAZolam Prudy Feeler) 1 MG tablet Take 1 mg by mouth 4 (four) times daily.    Historical Provider, MD  clindamycin (CLEOCIN) 150 MG capsule Take 2 capsules (300 mg total) by mouth 4 (four) times daily.  For 7 days Patient not taking: Reported on 08/07/2015 07/19/15   Tammy Triplett, PA-C  hydrOXYzine (ATARAX/VISTARIL) 25 MG tablet Take 1 tablet (25 mg total) by mouth every 8 (eight) hours as needed for anxiety. Patient not taking: Reported on 10/24/2014 09/07/14   Rolland Porter, MD  meclizine (ANTIVERT) 25 MG tablet Take 1 tablet (25 mg total) by mouth 3 (three) times daily as needed for dizziness. Patient not taking: Reported on 07/19/2015 05/20/15   Denorris Jester, DO  naproxen (NAPROSYN) 500 MG tablet Take 1 tablet (500 mg total) by mouth 2 (two) times daily with a meal. Patient not taking: Reported on 08/07/2015 07/19/15   Pauline Aus, PA-C   Triage Vitals: BP 123/74 mmHg  Pulse 81  Temp(Src) 98.4 F (36.9 C) (Oral)  Resp 20  Ht 5\' 9"  (1.753 m)  Wt 220 lb (99.791 kg)  BMI 32.47 kg/m2  SpO2 99% Physical Exam  Constitutional: He is oriented to person, place, and time. He appears well-developed and well-nourished.  HENT:  Head: Normocephalic.  Eyes: EOM are normal.  Neck: Normal range of motion.  Pulmonary/Chest: Effort normal and breath sounds normal. No respiratory distress.  Abdominal: He exhibits no distension.  Musculoskeletal: Normal range of motion.  Neurological: He is alert and oriented to person, place, and time.  Skin: Lesion (Scratched lesions  on bilateral flank and lower back.) and rash noted.   No streaking or hot areas. No hives noted.   Psychiatric: He has a normal mood and affect.  Nursing note and vitals reviewed.   ED Course  Procedures (including critical care time) DIAGNOSTIC STUDIES: Oxygen Saturation is 99% on RA, nl by my interpretation.    COORDINATION OF CARE: 7:26 PM: Discussed treatment plan which includes steroids and vistaril with pt at bedside; patient verbalizes understanding and agrees with treatment plan.   MDM  Vital signs reviewed. Pulse ox 99% on room air. No evidence of airway compromise. Pt is ambulatory without problem. Rx for  prednisone and atarax given to  The patient. He will follow up with dermatology if not improving.   Final diagnoses:  Rash    *I have reviewed nursing notes, vital signs, and all appropriate lab and imaging results for this patient.**  **I personally performed the services described in this documentation, which was scribed in my presence. The recorded information has been reviewed and is accurate.Ivery Quale, PA-C 09/07/15 2234  Bethann Berkshire, MD 09/11/15 [phone]

## 2015-09-02 NOTE — ED Notes (Signed)
Pt c/o rash to back

## 2015-09-02 NOTE — Discharge Instructions (Signed)
Please use prednisone taper and Atarax for your itching. Atarax may cause drowsiness, please use this medication with caution. Contact Dermatitis Contact dermatitis is a reaction to certain substances that touch the skin. Contact dermatitis can be either irritant contact dermatitis or allergic contact dermatitis. Irritant contact dermatitis does not require previous exposure to the substance for a reaction to occur.Allergic contact dermatitis only occurs if you have been exposed to the substance before. Upon a repeat exposure, your body reacts to the substance.  CAUSES  Many substances can cause contact dermatitis. Irritant dermatitis is most commonly caused by repeated exposure to mildly irritating substances, such as:  Makeup.  Soaps.  Detergents.  Bleaches.  Acids.  Metal salts, such as nickel. Allergic contact dermatitis is most commonly caused by exposure to:  Poisonous plants.  Chemicals (deodorants, shampoos).  Jewelry.  Latex.  Neomycin in triple antibiotic cream.  Preservatives in products, including clothing. SYMPTOMS  The area of skin that is exposed may develop:  Dryness or flaking.  Redness.  Cracks.  Itching.  Pain or a burning sensation.  Blisters. With allergic contact dermatitis, there may also be swelling in areas such as the eyelids, mouth, or genitals.  DIAGNOSIS  Your caregiver can usually tell what the problem is by doing a physical exam. In cases where the cause is uncertain and an allergic contact dermatitis is suspected, a patch skin test may be performed to help determine the cause of your dermatitis. TREATMENT Treatment includes protecting the skin from further contact with the irritating substance by avoiding that substance if possible. Barrier creams, powders, and gloves may be helpful. Your caregiver may also recommend:  Steroid creams or ointments applied 2 times daily. For best results, soak the rash area in cool water for 20 minutes.  Then apply the medicine. Cover the area with a plastic wrap. You can store the steroid cream in the refrigerator for a "chilly" effect on your rash. That may decrease itching. Oral steroid medicines may be needed in more severe cases.  Antibiotics or antibacterial ointments if a skin infection is present.  Antihistamine lotion or an antihistamine taken by mouth to ease itching.  Lubricants to keep moisture in your skin.  Burow's solution to reduce redness and soreness or to dry a weeping rash. Mix one packet or tablet of solution in 2 cups cool water. Dip a clean washcloth in the mixture, wring it out a bit, and put it on the affected area. Leave the cloth in place for 30 minutes. Do this as often as possible throughout the day.  Taking several cornstarch or baking soda baths daily if the area is too large to cover with a washcloth. Harsh chemicals, such as alkalis or acids, can cause skin damage that is like a burn. You should flush your skin for 15 to 20 minutes with cold water after such an exposure. You should also seek immediate medical care after exposure. Bandages (dressings), antibiotics, and pain medicine may be needed for severely irritated skin.  HOME CARE INSTRUCTIONS  Avoid the substance that caused your reaction.  Keep the area of skin that is affected away from hot water, soap, sunlight, chemicals, acidic substances, or anything else that would irritate your skin.  Do not scratch the rash. Scratching may cause the rash to become infected.  You may take cool baths to help stop the itching.  Only take over-the-counter or prescription medicines as directed by your caregiver.  See your caregiver for follow-up care as directed to make  sure your skin is healing properly. SEEK MEDICAL CARE IF:   Your condition is not better after 3 days of treatment.  You seem to be getting worse.  You see signs of infection such as swelling, tenderness, redness, soreness, or warmth in the  affected area.  You have any problems related to your medicines. Document Released: 12/05/2000 Document Revised: 03/01/2012 Document Reviewed: 05/13/2011 [facility] Patient Information 2015 [place], [place]. This information is not intended to replace advice given to you by your health care provider. Make sure you discuss any questions you have with your health care provider.

## 2015-09-14 ENCOUNTER — Encounter (HOSPITAL_COMMUNITY): Payer: Self-pay | Admitting: *Deleted

## 2015-09-14 ENCOUNTER — Emergency Department (HOSPITAL_COMMUNITY)
Admission: EM | Admit: 2015-09-14 | Discharge: 2015-09-14 | Disposition: A | Discharge status: Home / Self Care | Payer: Medicaid Other | Attending: Emergency Medicine | Admitting: Emergency Medicine

## 2015-09-14 DIAGNOSIS — Z880 Allergy status to penicillin: Secondary | ICD-10-CM | POA: Insufficient documentation

## 2015-09-14 DIAGNOSIS — X58XXXA Exposure to other specified factors, initial encounter: Secondary | ICD-10-CM | POA: Diagnosis not present

## 2015-09-14 DIAGNOSIS — Z792 Long term (current) use of antibiotics: Secondary | ICD-10-CM | POA: Insufficient documentation

## 2015-09-14 DIAGNOSIS — Z720 Tobacco use: Secondary | ICD-10-CM | POA: Insufficient documentation

## 2015-09-14 DIAGNOSIS — Z79899 Other long term (current) drug therapy: Secondary | ICD-10-CM | POA: Insufficient documentation

## 2015-09-14 DIAGNOSIS — Y9289 Other specified places as the place of occurrence of the external cause: Secondary | ICD-10-CM | POA: Insufficient documentation

## 2015-09-14 DIAGNOSIS — S39012A Strain of muscle, fascia and tendon of lower back, initial encounter: Secondary | ICD-10-CM | POA: Diagnosis not present

## 2015-09-14 DIAGNOSIS — Z8673 Personal history of transient ischemic attack (TIA), and cerebral infarction without residual deficits: Secondary | ICD-10-CM | POA: Insufficient documentation

## 2015-09-14 DIAGNOSIS — Y998 Other external cause status: Secondary | ICD-10-CM | POA: Diagnosis not present

## 2015-09-14 DIAGNOSIS — G8929 Other chronic pain: Secondary | ICD-10-CM | POA: Diagnosis not present

## 2015-09-14 DIAGNOSIS — I10 Essential (primary) hypertension: Secondary | ICD-10-CM | POA: Insufficient documentation

## 2015-09-14 DIAGNOSIS — S3992XA Unspecified injury of lower back, initial encounter: Secondary | ICD-10-CM | POA: Diagnosis present

## 2015-09-14 DIAGNOSIS — Y9389 Activity, other specified: Secondary | ICD-10-CM | POA: Diagnosis not present

## 2015-09-14 LAB — URINALYSIS, ROUTINE W REFLEX MICROSCOPIC
BILIRUBIN URINE: NEGATIVE
Glucose, UA: NEGATIVE mg/dL
HGB URINE DIPSTICK: NEGATIVE
Ketones, ur: NEGATIVE mg/dL
Leukocytes, UA: NEGATIVE
NITRITE: NEGATIVE
PH: 5.5 (ref 5.0–8.0)
Protein, ur: NEGATIVE mg/dL
Urobilinogen, UA: 0.2 mg/dL (ref 0.0–1.0)

## 2015-09-14 MED ORDER — CYCLOBENZAPRINE HCL 10 MG PO TABS: 10.0000 mg | ORAL_TABLET | Freq: Three times a day (TID) | ORAL | Status: DC | PRN

## 2015-09-14 MED ORDER — HYDROCODONE-ACETAMINOPHEN 5-325 MG PO TABS
2.0000 | ORAL_TABLET | Freq: Once | ORAL | Status: AC
  Administered 2015-09-14: 2 via ORAL
  Filled 2015-09-14: qty 2

## 2015-09-14 NOTE — ED Provider Notes (Signed)
CSN: [id number]     Arrival date & time 09/14/15  1402 History   First MD Initiated Contact with Patient 09/14/15 1458     Chief Complaint  Patient presents with  . Back Pain     (Consider location/radiation/quality/duration/timing/severity/associated sxs/prior Treatment) HPI  Gary Harrington is a 54 y.o. male who presents to the Emergency Department complaining of  Gradual onset of mid to right sided low back pain for several days.  He states that he "twisted" his back and has had pain since that time.  Reports pain is worse with movement,improves somewhat with rest.  He has tried OTC analgesics without relief.  He denies numbness, pain or weakness of the lower extremities, abdominal pain, urine or bowel incontinence or retention, and dysuria.  Also denies known trauma.     Past Medical History  Diagnosis Date  . Stroke   . Hypertension   . Chronic pain   . Headache   . Dizziness    Past Surgical History  Procedure Laterality Date  . Tonsillectomy     Family History  Problem Relation Age of Onset  . Stroke Father   . Heart failure Father    Social History  Substance Use Topics  . Smoking status: Current Every Day Smoker -- 0.50 packs/day for 27 years    Types: Cigarettes  . Smokeless tobacco: Never Used  . Alcohol Use: No    Review of Systems  Constitutional: Negative for fever.  Respiratory: Negative for shortness of breath.   Gastrointestinal: Negative for vomiting, abdominal pain and constipation.  Genitourinary: Negative for dysuria, hematuria, flank pain, decreased urine volume and difficulty urinating.  Musculoskeletal: Positive for back pain. Negative for joint swelling.  Skin: Negative for rash.  Neurological: Negative for weakness and numbness.  All other systems reviewed and are negative.     Allergies  Codeine and Penicillins  Home Medications   Prior to Admission medications   Medication Sig Start Date End Date Taking? Authorizing Provider   ALPRAZolam Prudy Feeler) 1 MG tablet Take 1 mg by mouth 4 (four) times daily.   Yes Historical Provider, MD  aspirin EC 81 MG tablet Take 81 mg by mouth daily.     Yes Historical Provider, MD  Aspirin-Acetaminophen-Caffeine (GOODY HEADACHE PO) Take 1 packet by mouth every 4 (four) hours.   Yes Historical Provider, MD  clindamycin (CLEOCIN) 150 MG capsule Take 2 capsules (300 mg total) by mouth 4 (four) times daily. For 7 days Patient not taking: Reported on 08/07/2015 07/19/15   Tammy Triplett, PA-C  cyclobenzaprine (FLEXERIL) 10 MG tablet Take 1 tablet (10 mg total) by mouth 3 (three) times daily as needed. 09/14/15   Tammy Triplett, PA-C  hydrOXYzine (ATARAX/VISTARIL) 25 MG tablet Take 1 tablet (25 mg total) by mouth every 6 (six) hours. Patient not taking: Reported on 09/14/2015 09/02/15   Ivery Quale, PA-C  meclizine (ANTIVERT) 25 MG tablet Take 1 tablet (25 mg total) by mouth 3 (three) times daily as needed for dizziness. Patient not taking: Reported on 07/19/2015 05/20/15   La Jester, DO  naproxen (NAPROSYN) 500 MG tablet Take 1 tablet (500 mg total) by mouth 2 (two) times daily with a meal. Patient not taking: Reported on 08/07/2015 07/19/15   Tammy Triplett, PA-C  predniSONE (DELTASONE) 10 MG tablet 5,4,3,2,1 - take with food Patient not taking: Reported on 09/14/2015 09/02/15   Ivery Quale, PA-C   BP 123/76 mmHg  Pulse 80  Temp(Src) 97.6 F (36.4 C) (Oral)  Resp  16  Ht  (1.753 m)  Wt 220 lb (99.791 kg)  BMI 32.47 kg/m2  SpO2 99% Physical Exam  Constitutional: He is oriented to person, place, and time. He appears well-developed and well-nourished. No distress.  HENT:  Head: Normocephalic and atraumatic.  Neck: Normal range of motion. Neck supple.  Cardiovascular: Normal rate, regular rhythm, normal heart sounds and intact distal pulses.   No murmur heard. Pulmonary/Chest: Effort normal and breath sounds normal. No respiratory distress.  Abdominal: Soft. He exhibits no  distension. There is no tenderness.  Musculoskeletal: He exhibits tenderness. He exhibits no edema.       Lumbar back: He exhibits tenderness and pain. He exhibits normal range of motion, no swelling, no deformity, no laceration and normal pulse.  ttp of the right lumbar paraspinal muscles.   DP pulses are brisk and symmetrical.  Distal sensation intact.  Hip Flexors/Extensors are intact.  Pt has 5/5 strength against resistance of bilateral lower extremities.     Neurological: He is alert and oriented to person, place, and time. He has normal strength. No sensory deficit. He exhibits normal muscle tone. Coordination and gait normal.  Reflex Scores:      Patellar reflexes are 2+ on the right side and 2+ on the left side.      Achilles reflexes are 2+ on the right side and 2+ on the left side. Skin: Skin is warm and dry. No rash noted.  Nursing note and vitals reviewed.   ED Course  Procedures (including critical care time) Labs Review Labs Reviewed  URINALYSIS, ROUTINE W REFLEX MICROSCOPIC (NOT AT [facility]) - Abnormal; Notable for the following:    Specific Gravity, Urine >1.030 (*)    All other components within normal limits      MDM   Final diagnoses:  Lumbar strain, initial encounter    Pt is ambulatory, no focal neuro deficits on exam.  vitals stable.  No concerning symptoms for emergent neurological or infectious process.  Likely musculoskeletal .  Pt understands that prescription narcotic medication are not indicated.  Appears stable for d/c    Pauline Aus, PA-C 09/14/15 1650  Bethann Berkshire, MD 09/17/15 [phone]

## 2015-09-14 NOTE — Discharge Instructions (Signed)

## 2015-09-14 NOTE — ED Notes (Signed)
Back pain

## 2015-10-14 ENCOUNTER — Emergency Department (HOSPITAL_COMMUNITY)
Admission: EM | Admit: 2015-10-14 | Discharge: 2015-10-14 | Disposition: A | Discharge status: Home / Self Care | Payer: Medicaid Other | Attending: Emergency Medicine | Admitting: Emergency Medicine

## 2015-10-14 ENCOUNTER — Encounter (HOSPITAL_COMMUNITY): Payer: Self-pay | Admitting: Emergency Medicine

## 2015-10-14 DIAGNOSIS — S70361A Insect bite (nonvenomous), right thigh, initial encounter: Secondary | ICD-10-CM | POA: Diagnosis not present

## 2015-10-14 DIAGNOSIS — Z8673 Personal history of transient ischemic attack (TIA), and cerebral infarction without residual deficits: Secondary | ICD-10-CM | POA: Insufficient documentation

## 2015-10-14 DIAGNOSIS — Z7982 Long term (current) use of aspirin: Secondary | ICD-10-CM | POA: Diagnosis not present

## 2015-10-14 DIAGNOSIS — Z720 Tobacco use: Secondary | ICD-10-CM | POA: Diagnosis not present

## 2015-10-14 DIAGNOSIS — W57XXXA Bitten or stung by nonvenomous insect and other nonvenomous arthropods, initial encounter: Secondary | ICD-10-CM | POA: Diagnosis not present

## 2015-10-14 DIAGNOSIS — I10 Essential (primary) hypertension: Secondary | ICD-10-CM | POA: Diagnosis not present

## 2015-10-14 DIAGNOSIS — Y998 Other external cause status: Secondary | ICD-10-CM | POA: Insufficient documentation

## 2015-10-14 DIAGNOSIS — Y9389 Activity, other specified: Secondary | ICD-10-CM | POA: Insufficient documentation

## 2015-10-14 DIAGNOSIS — Z79899 Other long term (current) drug therapy: Secondary | ICD-10-CM | POA: Diagnosis not present

## 2015-10-14 DIAGNOSIS — Z880 Allergy status to penicillin: Secondary | ICD-10-CM | POA: Diagnosis not present

## 2015-10-14 DIAGNOSIS — Y9289 Other specified places as the place of occurrence of the external cause: Secondary | ICD-10-CM | POA: Diagnosis not present

## 2015-10-14 DIAGNOSIS — G8929 Other chronic pain: Secondary | ICD-10-CM | POA: Diagnosis not present

## 2015-10-14 LAB — BASIC METABOLIC PANEL
ANION GAP: 9 (ref 5–15)
BUN: 15 mg/dL (ref 6–20)
CALCIUM: 8.9 mg/dL (ref 8.9–10.3)
CO2: 23 mmol/L (ref 22–32)
Chloride: 104 mmol/L (ref 101–111)
Creatinine, Ser: 0.85 mg/dL (ref 0.61–1.24)
GLUCOSE: 98 mg/dL (ref 65–99)
Potassium: 3.8 mmol/L (ref 3.5–5.1)
SODIUM: 136 mmol/L (ref 135–145)

## 2015-10-14 LAB — CBC
HCT: 39 % (ref 39.0–52.0)
Hemoglobin: 12.8 g/dL — ABNORMAL LOW (ref 13.0–17.0)
MCH: 21.8 pg — ABNORMAL LOW (ref 26.0–34.0)
MCHC: 32.8 g/dL (ref 30.0–36.0)
MCV: 66.4 fL — ABNORMAL LOW (ref 78.0–100.0)
PLATELETS: 223 10*3/uL (ref 150–400)
RBC: 5.87 MIL/uL — AB (ref 4.22–5.81)
RDW: 15.5 % (ref 11.5–15.5)
WBC: 7.9 10*3/uL (ref 4.0–10.5)

## 2015-10-14 MED ORDER — DOXYCYCLINE HYCLATE 100 MG PO CAPS: 100.0000 mg | ORAL_CAPSULE | Freq: Two times a day (BID) | ORAL | Status: DC

## 2015-10-14 MED ORDER — DOXYCYCLINE HYCLATE 100 MG PO TABS
100.0000 mg | ORAL_TABLET | Freq: Once | ORAL | Status: AC
  Administered 2015-10-14: 100 mg via ORAL
  Filled 2015-10-14: qty 1

## 2015-10-14 NOTE — Discharge Instructions (Signed)
Return to the ED with any concerns including fever/chills, vomiting and not able to keep down liquids or antibiotics, or any other alarming symptoms °

## 2015-10-14 NOTE — ED Notes (Signed)
Patient states he removed a tick from his right upper thigh last night. Complaining of fever, chills, and generalized weakness starting after he removed tick.

## 2015-10-14 NOTE — ED Provider Notes (Signed)
CSN: [id number]     Arrival date & time 10/14/15  [id number] History   First MD Initiated Contact with Patient 10/14/15 0755     Chief Complaint  Patient presents with  . Insect Bite     (Consider location/radiation/quality/duration/timing/severity/associated sxs/prior Treatment) HPI  Pt presenting with c/o low grade fever body aches and generalized weakness.  He found a tick on his right upper thigh and removed it last night.  This is when his symptoms started.  States his temp was almost 100 last night.  No rash other than some redness around the site of the tick.  No vomiting or change in stools.  No abdominal pain.  He has not had any treatment prior to arrival.  There are no other associated systemic symptoms, there are no other alleviating or modifying factors.   Past Medical History  Diagnosis Date  . Stroke (HCC)   . Hypertension   . Chronic pain   . Headache   . Dizziness    Past Surgical History  Procedure Laterality Date  . Tonsillectomy     Family History  Problem Relation Age of Onset  . Stroke Father   . Heart failure Father    Social History  Substance Use Topics  . Smoking status: Current Every Day Smoker -- 0.50 packs/day for 27 years    Types: Cigarettes  . Smokeless tobacco: Never Used  . Alcohol Use: No    Review of Systems  ROS reviewed and all otherwise negative except for mentioned in HPI   Allergies  Codeine and Penicillins  Home Medications   Prior to Admission medications   Medication Sig Start Date End Date Taking? Authorizing Provider  ALPRAZolam Prudy Feeler(XANAX) 1 MG tablet Take 1 mg by mouth 4 (four) times daily.    Historical Provider, MD  aspirin EC 81 MG tablet Take 81 mg by mouth daily.      Historical Provider, MD  Aspirin-Acetaminophen-Caffeine (GOODY HEADACHE PO) Take 1 packet by mouth every 4 (four) hours.    Historical Provider, MD  clindamycin (CLEOCIN) 150 MG capsule Take 2 capsules (300 mg total) by mouth 4 (four) times daily. For 7  days Patient not taking: Reported on 08/07/2015 07/19/15   Tammy Triplett, PA-C  cyclobenzaprine (FLEXERIL) 10 MG tablet Take 1 tablet (10 mg total) by mouth 3 (three) times daily as needed. 09/14/15   Tammy Triplett, PA-C  doxycycline (VIBRAMYCIN) 100 MG capsule Take 1 capsule (100 mg total) by mouth 2 (two) times daily. 10/14/15   Jerelyn ScottMartha Linker, MD  hydrOXYzine (ATARAX/VISTARIL) 25 MG tablet Take 1 tablet (25 mg total) by mouth every 6 (six) hours. Patient not taking: Reported on 09/14/2015 09/02/15   Ivery QualeHobson Bryant, PA-C  meclizine (ANTIVERT) 25 MG tablet Take 1 tablet (25 mg total) by mouth 3 (three) times daily as needed for dizziness. Patient not taking: Reported on 07/19/2015 05/20/15   Paulino JesterKathleen McManus, DO  naproxen (NAPROSYN) 500 MG tablet Take 1 tablet (500 mg total) by mouth 2 (two) times daily with a meal. Patient not taking: Reported on 08/07/2015 07/19/15   Tammy Triplett, PA-C  predniSONE (DELTASONE) 10 MG tablet 5,4,3,2,1 - take with food Patient not taking: Reported on 09/14/2015 09/02/15   Ivery QualeHobson Bryant, PA-C   BP 119/75 mmHg  Pulse 67  Temp(Src) 97.8 F (36.6 C) (Oral)  Resp 20  Ht 5\' 9"  (1.753 m)  Wt 216 lb (97.977 kg)  BMI 31.88 kg/m2  SpO2 98%  Vitals reviewed Physical Exam  Physical Examination: General  appearance - alert, well appearing, and in no distress Mental status - alert, oriented to person, place, and time Eyes - no conjunctival injection, no scleral icterus Mouth - mucous membranes moist, pharynx normal without lesions Chest - clear to auscultation, no wheezes, rales or rhonchi, symmetric air entry Heart - normal rate, regular rhythm, normal S1, S2, no murmurs, rubs, clicks or gallops Abdomen - soft, nontender, nondistended, no masses or organomegaly Neurological - alert, oriented, normal speech Extremities - peripheral pulses normal, no pedal edema, no clubbing or cyanosis Skin - normal coloration and turgor, no rashes, except for approx 1cm area of erythema on  right upper thigh at site where he removed a tick.   ED Course  Procedures (including critical care time) Labs Review Labs Reviewed  CBC - Abnormal; Notable for the following:    RBC 5.87 (*)    Hemoglobin 12.8 (*)    MCV 66.4 (*)    MCH 21.8 (*)    All other components within normal limits  BASIC METABOLIC PANEL  B. BURGDORFI ANTIBODIES  ROCKY MTN SPOTTED FVR ABS PNL(IGG+IGM)    Imaging Review No results found. I have personally reviewed and evaluated these images and lab results as part of my medical decision-making.   EKG Interpretation None      MDM   Final diagnoses:  Tick bite    Pt presenting after removal of a tick. He has localized erythema at area of tick bite- but also c/o subjective fever, body aches and weakness. Labs are reassuring. Lyme and rmsf titres pending.  Will start on course of doxycycline- given first dose in the ED.  Discharged with strict return precautions.  Pt agreeable with plan.    Jerelyn Scott, MD 10/14/15 1006

## 2015-10-15 LAB — ROCKY MTN SPOTTED FVR ABS PNL(IGG+IGM)
RMSF IGG: NEGATIVE
RMSF IGM: 0.31 {index} (ref 0.00–0.89)

## 2015-10-15 LAB — B. BURGDORFI ANTIBODIES

## 2015-10-27 ENCOUNTER — Encounter (HOSPITAL_COMMUNITY): Payer: Self-pay

## 2015-10-27 ENCOUNTER — Emergency Department (HOSPITAL_COMMUNITY)
Admission: EM | Admit: 2015-10-27 | Discharge: 2015-10-27 | Disposition: A | Discharge status: Home / Self Care | Payer: Medicaid Other | Attending: Emergency Medicine | Admitting: Emergency Medicine

## 2015-10-27 DIAGNOSIS — I10 Essential (primary) hypertension: Secondary | ICD-10-CM | POA: Diagnosis not present

## 2015-10-27 DIAGNOSIS — Z79899 Other long term (current) drug therapy: Secondary | ICD-10-CM | POA: Insufficient documentation

## 2015-10-27 DIAGNOSIS — Z8673 Personal history of transient ischemic attack (TIA), and cerebral infarction without residual deficits: Secondary | ICD-10-CM | POA: Insufficient documentation

## 2015-10-27 DIAGNOSIS — J209 Acute bronchitis, unspecified: Secondary | ICD-10-CM | POA: Insufficient documentation

## 2015-10-27 DIAGNOSIS — Z880 Allergy status to penicillin: Secondary | ICD-10-CM | POA: Insufficient documentation

## 2015-10-27 DIAGNOSIS — Z7982 Long term (current) use of aspirin: Secondary | ICD-10-CM | POA: Diagnosis not present

## 2015-10-27 DIAGNOSIS — G8929 Other chronic pain: Secondary | ICD-10-CM | POA: Diagnosis not present

## 2015-10-27 DIAGNOSIS — R05 Cough: Secondary | ICD-10-CM | POA: Diagnosis present

## 2015-10-27 DIAGNOSIS — Z720 Tobacco use: Secondary | ICD-10-CM | POA: Insufficient documentation

## 2015-10-27 DIAGNOSIS — J40 Bronchitis, not specified as acute or chronic: Secondary | ICD-10-CM

## 2015-10-27 MED ORDER — ALBUTEROL SULFATE HFA 108 (90 BASE) MCG/ACT IN AERS
2.0000 | INHALATION_SPRAY | RESPIRATORY_TRACT | Status: DC
  Administered 2015-10-27: 2 via RESPIRATORY_TRACT
  Filled 2015-10-27: qty 6.7

## 2015-10-27 MED ORDER — AZITHROMYCIN 250 MG PO TABS: 250.0000 mg | ORAL_TABLET | Freq: Every day | ORAL | Status: DC

## 2015-10-27 NOTE — ED Provider Notes (Signed)
CSN: [id number]     Arrival date & time 10/27/15  2001 History   First MD Initiated Contact with Patient 10/27/15 2012     Chief Complaint  Patient presents with  . URI     (Consider location/radiation/quality/duration/timing/severity/associated sxs/prior Treatment) Patient is a 54 y.o. male presenting with URI. The history is provided by the patient. No language interpreter was used.  URI Presenting symptoms: congestion and cough   Severity:  Moderate Onset quality:  Gradual Duration:  2 weeks Timing:  Constant Progression:  Worsening Chronicity:  New Relieved by:  Nothing Ineffective treatments:  None tried Associated symptoms: sinus pain   Risk factors: chronic respiratory disease     Past Medical History  Diagnosis Date  . Stroke (HCC)   . Hypertension   . Chronic pain   . Headache   . Dizziness    Past Surgical History  Procedure Laterality Date  . Tonsillectomy     Family History  Problem Relation Age of Onset  . Stroke Father   . Heart failure Father    Social History  Substance Use Topics  . Smoking status: Current Every Day Smoker -- 0.50 packs/day for 27 years    Types: Cigarettes  . Smokeless tobacco: Never Used  . Alcohol Use: No    Review of Systems  HENT: Positive for congestion.   Respiratory: Positive for cough.   All other systems reviewed and are negative.     Allergies  Codeine and Penicillins  Home Medications   Prior to Admission medications   Medication Sig Start Date End Date Taking? Authorizing Provider  ALPRAZolam Prudy Feeler(XANAX) 1 MG tablet Take 1 mg by mouth 4 (four) times daily.    Historical Provider, MD  aspirin EC 81 MG tablet Take 81 mg by mouth daily.      Historical Provider, MD  Aspirin-Acetaminophen-Caffeine (GOODY HEADACHE PO) Take 1 packet by mouth every 4 (four) hours.    Historical Provider, MD  azithromycin (ZITHROMAX) 250 MG tablet Take 1 tablet (250 mg total) by mouth daily. Take first 2 tablets together, then 1  every day until finished. 10/27/15   Elson AreasLeslie K Sofia, PA-C  clindamycin (CLEOCIN) 150 MG capsule Take 2 capsules (300 mg total) by mouth 4 (four) times daily. For 7 days Patient not taking: Reported on 08/07/2015 07/19/15   Tammy Triplett, PA-C  cyclobenzaprine (FLEXERIL) 10 MG tablet Take 1 tablet (10 mg total) by mouth 3 (three) times daily as needed. 09/14/15   Tammy Triplett, PA-C  doxycycline (VIBRAMYCIN) 100 MG capsule Take 1 capsule (100 mg total) by mouth 2 (two) times daily. 10/14/15   Jerelyn ScottMartha Linker, MD  hydrOXYzine (ATARAX/VISTARIL) 25 MG tablet Take 1 tablet (25 mg total) by mouth every 6 (six) hours. Patient not taking: Reported on 09/14/2015 09/02/15   Ivery QualeHobson Bryant, PA-C  meclizine (ANTIVERT) 25 MG tablet Take 1 tablet (25 mg total) by mouth 3 (three) times daily as needed for dizziness. Patient not taking: Reported on 07/19/2015 05/20/15   Aulden JesterKathleen McManus, DO  naproxen (NAPROSYN) 500 MG tablet Take 1 tablet (500 mg total) by mouth 2 (two) times daily with a meal. Patient not taking: Reported on 08/07/2015 07/19/15   Tammy Triplett, PA-C  predniSONE (DELTASONE) 10 MG tablet 5,4,3,2,1 - take with food Patient not taking: Reported on 09/14/2015 09/02/15   Ivery QualeHobson Bryant, PA-C   BP 136/65 mmHg  Pulse 75  Temp(Src) 97.4 F (36.3 C) (Oral)  Resp 17  Ht 5\' 9"  (1.753 m)  Wt 216  lb (97.977 kg)  BMI 31.88 kg/m2  SpO2 97% Physical Exam  Constitutional: He is oriented to person, place, and time. He appears well-developed and well-nourished.  HENT:  Head: Normocephalic.  Eyes: EOM are normal.  Neck: Normal range of motion.  Cardiovascular: Normal rate.   Pulmonary/Chest: Effort normal.  Abdominal: Soft. He exhibits no distension.  Musculoskeletal: Normal range of motion.  Neurological: He is alert and oriented to person, place, and time.  Skin: Skin is warm.  Psychiatric: He has a normal mood and affect.  Nursing note and vitals reviewed.   ED Course  Procedures (including critical care  time) Labs Review Labs Reviewed - No data to display  Imaging Review No results found. I have personally reviewed and evaluated these images and lab results as part of my medical decision-making.   EKG Interpretation None      MDM   Final diagnoses:  Bronchitis    zithromax Albuterol followup with your Physicain for recheck    Elson Areas, Cordelia Poche 10/27/15 2031  Raeford Razor, MD 10/27/15 2314

## 2015-10-27 NOTE — ED Notes (Signed)
I have had a cold for two weeks and I want an antibiotic to get rid of it. I have been having a cough, chest congestion, and headache. Denies sore throat or ear pain.

## 2015-10-27 NOTE — Discharge Instructions (Signed)

## 2015-11-15 ENCOUNTER — Emergency Department (HOSPITAL_COMMUNITY)
Admission: EM | Admit: 2015-11-15 | Discharge: 2015-11-15 | Disposition: A | Discharge status: Home / Self Care | Payer: Medicaid Other | Attending: Emergency Medicine | Admitting: Emergency Medicine

## 2015-11-15 ENCOUNTER — Encounter (HOSPITAL_COMMUNITY): Payer: Self-pay | Admitting: Cardiology

## 2015-11-15 DIAGNOSIS — Z8673 Personal history of transient ischemic attack (TIA), and cerebral infarction without residual deficits: Secondary | ICD-10-CM | POA: Diagnosis not present

## 2015-11-15 DIAGNOSIS — Z880 Allergy status to penicillin: Secondary | ICD-10-CM | POA: Diagnosis not present

## 2015-11-15 DIAGNOSIS — F419 Anxiety disorder, unspecified: Secondary | ICD-10-CM | POA: Diagnosis present

## 2015-11-15 DIAGNOSIS — Z7982 Long term (current) use of aspirin: Secondary | ICD-10-CM | POA: Insufficient documentation

## 2015-11-15 DIAGNOSIS — I10 Essential (primary) hypertension: Secondary | ICD-10-CM | POA: Insufficient documentation

## 2015-11-15 DIAGNOSIS — L299 Pruritus, unspecified: Secondary | ICD-10-CM | POA: Insufficient documentation

## 2015-11-15 DIAGNOSIS — F17210 Nicotine dependence, cigarettes, uncomplicated: Secondary | ICD-10-CM | POA: Insufficient documentation

## 2015-11-15 DIAGNOSIS — Z79899 Other long term (current) drug therapy: Secondary | ICD-10-CM | POA: Diagnosis not present

## 2015-11-15 DIAGNOSIS — G8929 Other chronic pain: Secondary | ICD-10-CM | POA: Diagnosis not present

## 2015-11-15 MED ORDER — ALPRAZOLAM 1 MG PO TABS
1.0000 mg | ORAL_TABLET | Freq: Two times a day (BID) | ORAL | Status: DC | PRN
Start: 2015-11-15 — End: 2016-04-22

## 2015-11-15 NOTE — ED Provider Notes (Signed)
CSN: [id number]     Arrival date & time 11/15/15  1659 History   First MD Initiated Contact with Patient 11/15/15 1716     Chief Complaint  Patient presents with  . Rash  . Anxiety     (Consider location/radiation/quality/duration/timing/severity/associated sxs/prior Treatment) The history is provided by the patient.   Gary Harrington is a 54 y.o. male with a history of chronic anxiety disorder with a long time history of xanax use presenting with generalized itchiness but most intense in his scalp and his feet with increased anxiety as his adopted mother is currently admitted here and not expected to recover.  He has obtained a primary doctor willing to take his insurance in [place] and has an appointment with him in 6 days,  In the interm,  Is asking for assistance with his nerves.  He denies rash, but states he typically becomes very itchy when under increased stress.  He has used topical benadryl cream and atarax tablets without relief of symptoms.    Past Medical History  Diagnosis Date  . Stroke (HCC)   . Hypertension   . Chronic pain   . Headache   . Dizziness    Past Surgical History  Procedure Laterality Date  . Tonsillectomy     Family History  Problem Relation Age of Onset  . Stroke Father   . Heart failure Father    Social History  Substance Use Topics  . Smoking status: Current Every Day Smoker -- 0.50 packs/day for 27 years    Types: Cigarettes  . Smokeless tobacco: Never Used  . Alcohol Use: No    Review of Systems  Constitutional: Negative for fever.  HENT: Negative for congestion and sore throat.   Eyes: Negative.   Respiratory: Negative for chest tightness and shortness of breath.   Cardiovascular: Negative for chest pain.  Gastrointestinal: Negative for nausea and abdominal pain.  Genitourinary: Negative.   Musculoskeletal: Negative for joint swelling, arthralgias and neck pain.  Skin: Negative for rash and wound.       Negative except as  mentioned in HPI.    Neurological: Negative for dizziness, weakness, light-headedness, numbness and headaches.  Psychiatric/Behavioral: The patient is nervous/anxious.       Allergies  Codeine and Penicillins  Home Medications   Prior to Admission medications   Medication Sig Start Date End Date Taking? Authorizing Provider  aspirin EC 81 MG tablet Take 81 mg by mouth daily.     Yes Historical Provider, MD  Aspirin-Acetaminophen-Caffeine (GOODY HEADACHE PO) Take 1 packet by mouth every 4 (four) hours.   Yes Historical Provider, MD  busPIRone (BUSPAR) 15 MG tablet Take 15 mg by mouth 3 (three) times daily.   Yes Historical Provider, MD  ALPRAZolam Prudy Feeler) 1 MG tablet Take 1 tablet (1 mg total) by mouth 2 (two) times daily as needed for anxiety. 11/15/15   Burgess Amor, PA-C  azithromycin (ZITHROMAX) 250 MG tablet Take 1 tablet (250 mg total) by mouth daily. Take first 2 tablets together, then 1 every day until finished. Patient not taking: Reported on 11/15/2015 10/27/15   Elson Areas, PA-C  clindamycin (CLEOCIN) 150 MG capsule Take 2 capsules (300 mg total) by mouth 4 (four) times daily. For 7 days Patient not taking: Reported on 08/07/2015 07/19/15   Tammy Triplett, PA-C  cyclobenzaprine (FLEXERIL) 10 MG tablet Take 1 tablet (10 mg total) by mouth 3 (three) times daily as needed. Patient not taking: Reported on 11/15/2015 09/14/15  Tammy Triplett, PA-C  doxycycline (VIBRAMYCIN) 100 MG capsule Take 1 capsule (100 mg total) by mouth 2 (two) times daily. Patient not taking: Reported on 11/15/2015 10/14/15   Jerelyn ScottMartha Linker, MD  hydrOXYzine (ATARAX/VISTARIL) 25 MG tablet Take 1 tablet (25 mg total) by mouth every 6 (six) hours. Patient not taking: Reported on 09/14/2015 09/02/15   Ivery QualeHobson Bryant, PA-C  meclizine (ANTIVERT) 25 MG tablet Take 1 tablet (25 mg total) by mouth 3 (three) times daily as needed for dizziness. Patient not taking: Reported on 07/19/2015 05/20/15   Deniz JesterKathleen McManus, DO   naproxen (NAPROSYN) 500 MG tablet Take 1 tablet (500 mg total) by mouth 2 (two) times daily with a meal. Patient not taking: Reported on 08/07/2015 07/19/15   Tammy Triplett, PA-C  predniSONE (DELTASONE) 10 MG tablet 5,4,3,2,1 - take with food Patient not taking: Reported on 09/14/2015 09/02/15   Ivery QualeHobson Bryant, PA-C   BP 126/77 mmHg  Pulse 91  Temp(Src) 98.1 F (36.7 C) (Oral)  Resp 18  Ht 5\' 9"  (1.753 m)  Wt 97.523 kg  BMI 31.74 kg/m2  SpO2 98% Physical Exam  Constitutional: He appears well-developed and well-nourished.  HENT:  Head: Normocephalic and atraumatic.  Eyes: Conjunctivae are normal.  Cardiovascular: Normal rate, regular rhythm and normal heart sounds.   Pulmonary/Chest: Effort normal and breath sounds normal. He has no wheezes.  Abdominal: Soft.  Musculoskeletal: Normal range of motion.  Neurological: He is alert.  Skin: Skin is warm and dry. No rash noted. No erythema.  Psychiatric: His mood appears anxious.  Nursing note and vitals reviewed.   ED Course  Procedures (including critical care time) Labs Review Labs Reviewed - No data to display  Imaging Review No results found. I have personally reviewed and evaluated these images and lab results as part of my medical decision-making.   EKG Interpretation None      MDM   Final diagnoses:  Anxiety    Pt with stress induced pruritis, no rash noted on exam.  He is fairly anxious during encounter.  He was given xanax #12 tabs to cover until can see his pcp on Wednesday.  He was advised that he needs to f/u with pcp as planned.  Pt understands and agrees with plan.    Burgess AmorJulie Quatisha Zylka, PA-C 11/15/15 1831  Glynn OctaveStephen Rancour, MD 11/15/15 [phone]

## 2015-11-15 NOTE — Discharge Instructions (Signed)
Generalized Anxiety Disorder Generalized anxiety disorder (GAD) is a mental disorder. It interferes with life functions, including relationships, work, and school. GAD is different from normal anxiety, which everyone experiences at some point in their lives in response to specific life events and activities. Normal anxiety actually helps us prepare for and get through these life events and activities. Normal anxiety goes away after the event or activity is over.  GAD causes anxiety that is not necessarily related to specific events or activities. It also causes excess anxiety in proportion to specific events or activities. The anxiety associated with GAD is also difficult to control. GAD can vary from mild to severe. People with severe GAD can have intense waves of anxiety with physical symptoms (panic attacks).  SYMPTOMS The anxiety and worry associated with GAD are difficult to control. This anxiety and worry are related to many life events and activities and also occur more days than not for 6 months or longer. People with GAD also have three or more of the following symptoms (one or more in children):  Restlessness.   Fatigue.  Difficulty concentrating.   Irritability.  Muscle tension.  Difficulty sleeping or unsatisfying sleep. DIAGNOSIS GAD is diagnosed through an assessment by your health care provider. Your health care provider will ask you questions aboutyour mood,physical symptoms, and events in your life. Your health care provider may ask you about your medical history and use of alcohol or drugs, including prescription medicines. Your health care provider may also do a physical exam and blood tests. Certain medical conditions and the use of certain substances can cause symptoms similar to those associated with GAD. Your health care provider may refer you to a mental health specialist for further evaluation. TREATMENT The following therapies are usually used to treat GAD:    Medication. Antidepressant medication usually is prescribed for long-term daily control. Antianxiety medicines may be added in severe cases, especially when panic attacks occur.   Talk therapy (psychotherapy). Certain types of talk therapy can be helpful in treating GAD by providing support, education, and guidance. A form of talk therapy called cognitive behavioral therapy can teach you healthy ways to think about and react to daily life events and activities.  Stress managementtechniques. These include yoga, meditation, and exercise and can be very helpful when they are practiced regularly. A mental health specialist can help determine which treatment is best for you. Some people see improvement with one therapy. However, other people require a combination of therapies.   This information is not intended to replace advice given to you by your health care provider. Make sure you discuss any questions you have with your health care provider.   Document Released: 04/04/2013 Document Revised: 12/29/2014 Document Reviewed: 04/04/2013 Elsevier Interactive Patient Education 2016 [employer]  

## 2015-11-15 NOTE — ED Notes (Signed)
Rash to scalp times 2 days.  States he feels it is anxiety.  States he is out of his xanax times 1 week.

## 2015-12-05 ENCOUNTER — Encounter (HOSPITAL_COMMUNITY): Payer: Self-pay | Admitting: *Deleted

## 2015-12-05 ENCOUNTER — Emergency Department (HOSPITAL_COMMUNITY)
Admission: EM | Admit: 2015-12-05 | Discharge: 2015-12-05 | Disposition: A | Discharge status: Home / Self Care | Payer: Medicaid Other | Attending: Emergency Medicine | Admitting: Emergency Medicine

## 2015-12-05 ENCOUNTER — Emergency Department (HOSPITAL_COMMUNITY): Payer: Medicaid Other

## 2015-12-05 DIAGNOSIS — I10 Essential (primary) hypertension: Secondary | ICD-10-CM | POA: Diagnosis not present

## 2015-12-05 DIAGNOSIS — G8929 Other chronic pain: Secondary | ICD-10-CM | POA: Insufficient documentation

## 2015-12-05 DIAGNOSIS — F17210 Nicotine dependence, cigarettes, uncomplicated: Secondary | ICD-10-CM | POA: Diagnosis not present

## 2015-12-05 DIAGNOSIS — R29898 Other symptoms and signs involving the musculoskeletal system: Secondary | ICD-10-CM

## 2015-12-05 DIAGNOSIS — Z79899 Other long term (current) drug therapy: Secondary | ICD-10-CM | POA: Insufficient documentation

## 2015-12-05 DIAGNOSIS — R42 Dizziness and giddiness: Secondary | ICD-10-CM | POA: Diagnosis not present

## 2015-12-05 DIAGNOSIS — H539 Unspecified visual disturbance: Secondary | ICD-10-CM | POA: Insufficient documentation

## 2015-12-05 DIAGNOSIS — M6281 Muscle weakness (generalized): Secondary | ICD-10-CM | POA: Insufficient documentation

## 2015-12-05 DIAGNOSIS — Z880 Allergy status to penicillin: Secondary | ICD-10-CM | POA: Insufficient documentation

## 2015-12-05 DIAGNOSIS — Z8673 Personal history of transient ischemic attack (TIA), and cerebral infarction without residual deficits: Secondary | ICD-10-CM | POA: Insufficient documentation

## 2015-12-05 DIAGNOSIS — R51 Headache: Secondary | ICD-10-CM | POA: Insufficient documentation

## 2015-12-05 DIAGNOSIS — Z7982 Long term (current) use of aspirin: Secondary | ICD-10-CM | POA: Diagnosis not present

## 2015-12-05 LAB — DIFFERENTIAL
Basophils Absolute: 0.1 10*3/uL (ref 0.0–0.1)
Basophils Relative: 1 %
EOS ABS: 0.3 10*3/uL (ref 0.0–0.7)
Eosinophils Relative: 3 %
LYMPHS ABS: 2.8 10*3/uL (ref 0.7–4.0)
Lymphocytes Relative: 28 %
MONOS PCT: 6 %
Monocytes Absolute: 0.6 10*3/uL (ref 0.1–1.0)
NEUTROS ABS: 6.2 10*3/uL (ref 1.7–7.7)
Neutrophils Relative %: 62 %

## 2015-12-05 LAB — RAPID URINE DRUG SCREEN, HOSP PERFORMED
Amphetamines: NOT DETECTED
Barbiturates: NOT DETECTED
Benzodiazepines: POSITIVE — AB
Cocaine: NOT DETECTED
Opiates: NOT DETECTED
Tetrahydrocannabinol: NOT DETECTED

## 2015-12-05 LAB — CBC
HEMATOCRIT: 40.8 % (ref 39.0–52.0)
Hemoglobin: 13.4 g/dL (ref 13.0–17.0)
MCH: 21.7 pg — ABNORMAL LOW (ref 26.0–34.0)
MCHC: 32.8 g/dL (ref 30.0–36.0)
MCV: 66.1 fL — AB (ref 78.0–100.0)
Platelets: 232 10*3/uL (ref 150–400)
RBC: 6.17 MIL/uL — ABNORMAL HIGH (ref 4.22–5.81)
RDW: 15.5 % (ref 11.5–15.5)
WBC: 10 10*3/uL (ref 4.0–10.5)

## 2015-12-05 LAB — COMPREHENSIVE METABOLIC PANEL
ALT: 22 U/L (ref 17–63)
AST: 21 U/L (ref 15–41)
Albumin: 4.3 g/dL (ref 3.5–5.0)
Alkaline Phosphatase: 58 U/L (ref 38–126)
Anion gap: 9 (ref 5–15)
BILIRUBIN TOTAL: 0.7 mg/dL (ref 0.3–1.2)
BUN: 13 mg/dL (ref 6–20)
CALCIUM: 9.1 mg/dL (ref 8.9–10.3)
CO2: 24 mmol/L (ref 22–32)
CREATININE: 0.83 mg/dL (ref 0.61–1.24)
Chloride: 105 mmol/L (ref 101–111)
GFR calc Af Amer: >60 mL/min (ref >60)
Glucose, Bld: 89 mg/dL (ref 65–99)
Potassium: 4.1 mmol/L (ref 3.5–5.1)
Sodium: 138 mmol/L (ref 135–145)
TOTAL PROTEIN: 7.1 g/dL (ref 6.5–8.1)

## 2015-12-05 LAB — PROTIME-INR
INR: 1.06 (ref 0.00–1.49)
Prothrombin Time: 14 seconds (ref 11.6–15.2)

## 2015-12-05 LAB — TROPONIN I

## 2015-12-05 LAB — APTT: aPTT: 30 seconds (ref 24–37)

## 2015-12-05 MED ORDER — IBUPROFEN 400 MG PO TABS
600.0000 mg | ORAL_TABLET | Freq: Once | ORAL | Status: AC
  Administered 2015-12-05: 600 mg via ORAL
  Filled 2015-12-05: qty 2

## 2015-12-05 MED ORDER — DEXAMETHASONE SODIUM PHOSPHATE 4 MG/ML IJ SOLN
10.0000 mg | Freq: Once | INTRAMUSCULAR | Status: DC
  Filled 2015-12-05: qty 3

## 2015-12-05 MED ORDER — DEXAMETHASONE SODIUM PHOSPHATE 4 MG/ML IJ SOLN
10.0000 mg | Freq: Once | INTRAMUSCULAR | Status: AC
  Administered 2015-12-05: 10 mg via INTRAMUSCULAR

## 2015-12-05 NOTE — ED Notes (Addendum)
Pt. C/o right arm weakness that started 2 days ago. Reports dizziness and blurred vision. Patient alert without slurred speech. Ambulatory to triage.

## 2015-12-05 NOTE — ED Notes (Signed)
Spoke with EDP, will order CT of head.

## 2015-12-05 NOTE — Discharge Instructions (Signed)
Please read and follow all provided instructions.  Your diagnoses today include:  1. Upper extremity weakness     Tests performed today include:  Vital signs. See below for your results today.   Medications prescribed:   None  Home care instructions:  Follow any educational materials contained in this packet.  Follow-up instructions: Please follow-up with your primary care provider as needed for further evaluation of your symptoms.  Return instructions:   Please return to the Emergency Department if you experience worsening symptoms OR  - Fever (temperature greater than 101.56F)  - Bleeding that does not stop with holding pressure to the area    -Severe pain (please note that you may be more sore the day after your accident)  - Chest Pain  - Difficulty breathing  - Severe nausea or vomiting  - Inability to tolerate food and liquids  - Passing out  - Skin becoming red around your wounds  - Change in mental status (confusion or lethargy)  - New numbness or weakness     Please return if you have any other emergent concerns.  Additional Information:  Your vital signs today were: BP 111/79 mmHg   Pulse 62   Temp(Src) 98 F (36.7 C) (Oral)   Resp 14   Ht 5\' 9"  (1.753 m)   Wt 91.627 kg   BMI 29.82 kg/m2   SpO2 96% If your blood pressure (BP) was elevated above 135/85 this visit, please have this repeated by your doctor within one month. ---------------

## 2015-12-05 NOTE — ED Provider Notes (Signed)
CSN: [id number]     Arrival date & time 12/05/15  1427 History   First MD Initiated Contact with Patient 12/05/15 1542     Chief Complaint  Patient presents with  . Extremity Weakness    (Consider location/radiation/quality/duration/timing/severity/associated sxs/prior Treatment) HPI  54 y.o. male with  has a past medical history of Hypertension; Chronic pain; Headache; Dizziness; and Stroke (HCC). hx of a stroke in 2000, presents to the Emergency Department today complaining of R extremity upper weakness. Pt states that the weakness began 2 days ago with right shoulder pain. Still able to ambulate well. No facial droop. Reports numbness that radiates down the length of his arm to his palm that began today at 7am. Pt also reports a headache that is sharp that comes and goes with bouts that last upwards of 30 minutes. 9/10 pain. No other associated symptoms.       Past Medical History  Diagnosis Date  . Hypertension   . Chronic pain   . Headache   . Dizziness   . Stroke [facility])    Past Surgical History  Procedure Laterality Date  . Tonsillectomy     Family History  Problem Relation Age of Onset  . Stroke Father   . Heart failure Father    Social History  Substance Use Topics  . Smoking status: Current Every Day Smoker -- 0.50 packs/day for 27 years    Types: Cigarettes  . Smokeless tobacco: Never Used  . Alcohol Use: No    Review of Systems  Constitutional: Negative for fever, chills, diaphoresis and fatigue.  HENT: Negative for congestion, hearing loss, sinus pressure, sore throat and tinnitus.   Eyes: Positive for visual disturbance.  Respiratory: Negative for cough, chest tightness and shortness of breath.   Cardiovascular: Negative for chest pain.  Gastrointestinal: Negative for nausea, vomiting, abdominal pain, diarrhea and constipation.  Endocrine: Negative for cold intolerance and heat intolerance.  Genitourinary: Negative for dysuria and difficulty urinating.   Musculoskeletal: Negative for back pain and neck pain.  Skin: Negative for color change.  Neurological: Positive for numbness and headaches. Negative for dizziness, seizures, syncope, facial asymmetry, weakness and light-headedness.   Allergies  Codeine and Penicillins  Home Medications   Prior to Admission medications   Medication Sig Start Date End Date Taking? Authorizing Provider  ALPRAZolam Prudy Feeler) 1 MG tablet Take 1 tablet (1 mg total) by mouth 2 (two) times daily as needed for anxiety. 11/15/15  Yes Burgess Amor, PA-C  aspirin EC 81 MG tablet Take 81 mg by mouth daily.     Yes Historical Provider, MD  Aspirin-Acetaminophen-Caffeine (GOODY HEADACHE PO) Take 1 packet by mouth every 4 (four) hours.   Yes Historical Provider, MD  busPIRone (BUSPAR) 15 MG tablet Take 15 mg by mouth 3 (three) times daily.   Yes Historical Provider, MD  azithromycin (ZITHROMAX) 250 MG tablet Take 1 tablet (250 mg total) by mouth daily. Take first 2 tablets together, then 1 every day until finished. Patient not taking: Reported on 11/15/2015 10/27/15   Elson Areas, PA-C  clindamycin (CLEOCIN) 150 MG capsule Take 2 capsules (300 mg total) by mouth 4 (four) times daily. For 7 days Patient not taking: Reported on 08/07/2015 07/19/15   Tammy Triplett, PA-C  cyclobenzaprine (FLEXERIL) 10 MG tablet Take 1 tablet (10 mg total) by mouth 3 (three) times daily as needed. Patient not taking: Reported on 11/15/2015 09/14/15   Tammy Triplett, PA-C  doxycycline (VIBRAMYCIN) 100 MG capsule Take 1 capsule (100  mg total) by mouth 2 (two) times daily. Patient not taking: Reported on 11/15/2015 10/14/15   Jerelyn Scott, MD  hydrOXYzine (ATARAX/VISTARIL) 25 MG tablet Take 1 tablet (25 mg total) by mouth every 6 (six) hours. Patient not taking: Reported on 09/14/2015 09/02/15   Ivery Quale, PA-C  meclizine (ANTIVERT) 25 MG tablet Take 1 tablet (25 mg total) by mouth 3 (three) times daily as needed for dizziness. Patient not  taking: Reported on 07/19/2015 05/20/15   Jayshun Jester, DO  naproxen (NAPROSYN) 500 MG tablet Take 1 tablet (500 mg total) by mouth 2 (two) times daily with a meal. Patient not taking: Reported on 08/07/2015 07/19/15   Tammy Triplett, PA-C  predniSONE (DELTASONE) 10 MG tablet 5,4,3,2,1 - take with food Patient not taking: Reported on 09/14/2015 09/02/15   Ivery Quale, PA-C   BP 106/86 mmHg  Pulse 68  Temp(Src) 97.3 F (36.3 C) (Oral)  Resp 18  Ht  (1.753 m)  Wt 91.627 kg  BMI 29.82 kg/m2  SpO2 98% Physical Exam  Constitutional: He is oriented to person, place, and time. Vital signs are normal. He appears well-developed and well-nourished.  HENT:  Head: Normocephalic and atraumatic.  Eyes: EOM are normal. Pupils are equal, round, and reactive to light.  Neck: Normal range of motion. Neck supple.  Cardiovascular: Normal rate and regular rhythm.   Pulmonary/Chest: Effort normal and breath sounds normal.  Abdominal: Soft.  Musculoskeletal:       Right shoulder: He exhibits decreased range of motion, tenderness and pain. He exhibits no swelling, no deformity, no laceration, normal pulse and normal strength.       Right elbow: Normal.He exhibits normal range of motion, no swelling, no deformity and no laceration. No tenderness found.  Neurological: He is alert and oriented to person, place, and time. He has normal strength. No cranial nerve deficit or sensory deficit. He exhibits normal muscle tone.  Skin: Skin is warm and dry.  Psychiatric: He has a normal mood and affect. His behavior is normal.   ED Course  Procedures (including critical care time) Labs Review Labs Reviewed  CBC - Abnormal; Notable for the following:    RBC 6.17 (*)    MCV 66.1 (*)    MCH 21.7 (*)    All other components within normal limits  PROTIME-INR  APTT  DIFFERENTIAL  COMPREHENSIVE METABOLIC PANEL  TROPONIN I  URINE RAPID DRUG SCREEN, HOSP PERFORMED   Imaging Review Ct Head Wo  Contrast  12/05/2015  CLINICAL DATA:  Two day history of right upper extremity weakness. Dizziness and blurred vision. EXAM: CT HEAD WITHOUT CONTRAST TECHNIQUE: Contiguous axial images were obtained from the base of the skull through the vertex without intravenous contrast. COMPARISON:  May 20, 2015 FINDINGS: The ventricles are normal in size and configuration. There is no intracranial mass, hemorrhage, extra-axial fluid collection, or midline shift. Gray-white compartments. No acute infarct evident. The bony calvarium appears intact. The mastoid air cells are clear. No intraorbital lesions are appreciable. IMPRESSION: Study within normal limits. Electronically Signed   By: Bretta Bang III M.D.   On: 12/05/2015 15:15   I have personally reviewed and evaluated these images and lab results as part of my medical decision-making.   EKG Interpretation None      MDM  I have reviewed relevant laboratory values. I have reviewed relevant imaging studies. I have reviewed the relevant previous healthcare records. I obtained HPI from historian. Cases discussed with Attending Physician  ED Course: 4:18  PM- Ibuprofen, Decadron injection  Assessment: 89y M with pmh stroke in 2000 presents with R upper extremity weakness. Exam shows no cranial nerve deficits, muscle/strength/sensation intact x all 4 extremities. Pain w/ passive ROM of R shoulder and upon palpation. CT Neg. Suspicion more towards musculoskeletal than stroke.Marland Kitchen.    Disposition/Plan:  Give Ibupofen and decadron in ED and D/C home with f/u to PCP in 48 hours    Patient was discussed with Eber HongBrian Miller, MD    Final diagnoses:  Upper extremity weakness       Audry Piliyler Damarko Stitely, PA-C 12/05/15 1733  Eber HongBrian Miller, MD 12/09/15 [phone]

## 2015-12-05 NOTE — ED Provider Notes (Signed)
CT ordered prior to my evaluation - pt complains of headache - but also c/o pain in the R hand worse with movement - denies trauma, or deformity - has some numbness in the R arm but can't state where it is - on exam has normal strength at the shoulder to rotation and AB and AD duction - he has normal strength to biceps and triceps and at the wrist and grips - normal leg strength, no numbness detected on exam.  Imaging reviewed, K normal - pt has no obvoius findings - no sub Q emphysema and no swelling or skin changes or signs of nec fac, VS reassuring, pt stable for d/c after meds including nsaids and steroid with PCP f/u.  Pt expresses understanding.  Meds given in ED:  Medications  ibuprofen (ADVIL,MOTRIN) tablet 600 mg (600 mg Oral Given 12/05/15 1632)  dexamethasone (DECADRON) injection 10 mg (10 mg Intramuscular Given 12/05/15 1633)    Medical screening examination/treatment/procedure(s) were conducted as a shared visit with non-physician practitioner(s) and myself.  I personally evaluated the patient during the encounter.  Clinical Impression:   Final diagnoses:  Upper extremity weakness     Filed Vitals:   12/05/15 1538 12/05/15 1600 12/05/15 1622 12/05/15 1630  BP: 106/86 111/79 111/79 111/75  Pulse: 68 61 62 62  Temp:   98 F (36.7 C)   TempSrc:   Oral   Resp:  21 14   Height:      Weight:      SpO2: 98% 96% 96% 96%     Eber HongBrian Emmalene Kattner, MD 12/09/15 1333

## 2015-12-29 ENCOUNTER — Emergency Department (HOSPITAL_COMMUNITY)
Admission: EM | Admit: 2015-12-29 | Discharge: 2015-12-29 | Disposition: A | Discharge status: Home / Self Care | Payer: Medicaid Other | Attending: Emergency Medicine | Admitting: Emergency Medicine

## 2015-12-29 ENCOUNTER — Encounter (HOSPITAL_COMMUNITY): Payer: Self-pay

## 2015-12-29 DIAGNOSIS — Z8673 Personal history of transient ischemic attack (TIA), and cerebral infarction without residual deficits: Secondary | ICD-10-CM | POA: Insufficient documentation

## 2015-12-29 DIAGNOSIS — M25511 Pain in right shoulder: Secondary | ICD-10-CM

## 2015-12-29 DIAGNOSIS — Z880 Allergy status to penicillin: Secondary | ICD-10-CM | POA: Insufficient documentation

## 2015-12-29 DIAGNOSIS — G8929 Other chronic pain: Secondary | ICD-10-CM | POA: Diagnosis not present

## 2015-12-29 DIAGNOSIS — I10 Essential (primary) hypertension: Secondary | ICD-10-CM | POA: Diagnosis not present

## 2015-12-29 DIAGNOSIS — M79601 Pain in right arm: Secondary | ICD-10-CM | POA: Diagnosis present

## 2015-12-29 DIAGNOSIS — Z7982 Long term (current) use of aspirin: Secondary | ICD-10-CM | POA: Diagnosis not present

## 2015-12-29 DIAGNOSIS — F17210 Nicotine dependence, cigarettes, uncomplicated: Secondary | ICD-10-CM | POA: Insufficient documentation

## 2015-12-29 DIAGNOSIS — R51 Headache: Secondary | ICD-10-CM | POA: Diagnosis not present

## 2015-12-29 DIAGNOSIS — Z79899 Other long term (current) drug therapy: Secondary | ICD-10-CM | POA: Diagnosis not present

## 2015-12-29 MED ORDER — DEXAMETHASONE SODIUM PHOSPHATE 4 MG/ML IJ SOLN
8.0000 mg | Freq: Once | INTRAMUSCULAR | Status: AC
  Administered 2015-12-29: 8 mg via INTRAMUSCULAR
  Filled 2015-12-29: qty 2

## 2015-12-29 MED ORDER — ONDANSETRON HCL 4 MG PO TABS
4.0000 mg | ORAL_TABLET | Freq: Once | ORAL | Status: AC
  Administered 2015-12-29: 4 mg via ORAL
  Filled 2015-12-29: qty 1

## 2015-12-29 MED ORDER — METHOCARBAMOL 500 MG PO TABS: ORAL_TABLET | ORAL | Status: DC

## 2015-12-29 MED ORDER — METHOCARBAMOL 500 MG PO TABS
1000.0000 mg | ORAL_TABLET | Freq: Once | ORAL | Status: AC
  Administered 2015-12-29: 1000 mg via ORAL
  Filled 2015-12-29: qty 2

## 2015-12-29 MED ORDER — DEXAMETHASONE 6 MG PO TABS: 6.0000 mg | ORAL_TABLET | Freq: Two times a day (BID) | ORAL | Status: DC

## 2015-12-29 NOTE — ED Provider Notes (Signed)
CSN: [id number]     Arrival date & time 12/29/15  1504 History   First MD Initiated Contact with Patient 12/29/15 1513     Chief Complaint  Patient presents with  . Arm Pain     (Consider location/radiation/quality/duration/timing/severity/associated sxs/prior Treatment) Patient is a 55 y.o. male presenting with arm pain. The history is provided by the patient.  Arm Pain This is a chronic problem. The current episode started in the past 7 days. The problem occurs intermittently. The problem has been gradually worsening. Associated symptoms include arthralgias and headaches. Pertinent negatives include no fever. Nothing aggravates the symptoms. He has tried NSAIDs for the symptoms. The treatment provided no relief.    Past Medical History  Diagnosis Date  . Hypertension   . Chronic pain   . Headache   . Dizziness   . Stroke [facility](HCC)    Past Surgical History  Procedure Laterality Date  . Tonsillectomy     Family History  Problem Relation Age of Onset  . Stroke Father   . Heart failure Father    Social History  Substance Use Topics  . Smoking status: Current Every Day Smoker -- 0.50 packs/day for 27 years    Types: Cigarettes  . Smokeless tobacco: Never Used  . Alcohol Use: No    Review of Systems  Constitutional: Negative for fever.  Musculoskeletal: Positive for arthralgias.  Neurological: Positive for headaches.  All other systems reviewed and are negative.     Allergies  Codeine and Penicillins  Home Medications   Prior to Admission medications   Medication Sig Start Date End Date Taking? Authorizing Provider  ALPRAZolam Prudy Feeler(XANAX) 1 MG tablet Take 1 tablet (1 mg total) by mouth 2 (two) times daily as needed for anxiety. 11/15/15  Yes Burgess AmorJulie Idol, PA-C  aspirin EC 81 MG tablet Take 81 mg by mouth daily.     Yes Historical Provider, MD  Aspirin-Acetaminophen-Caffeine (GOODY HEADACHE PO) Take 1 packet by mouth every 4 (four) hours.   Yes Historical Provider, MD   busPIRone (BUSPAR) 15 MG tablet Take 15 mg by mouth 3 (three) times daily.   Yes Historical Provider, MD  azithromycin (ZITHROMAX) 250 MG tablet Take 1 tablet (250 mg total) by mouth daily. Take first 2 tablets together, then 1 every day until finished. Patient not taking: Reported on 11/15/2015 10/27/15   Elson AreasLeslie K Sofia, PA-C  clindamycin (CLEOCIN) 150 MG capsule Take 2 capsules (300 mg total) by mouth 4 (four) times daily. For 7 days Patient not taking: Reported on 08/07/2015 07/19/15   Tammy Triplett, PA-C  cyclobenzaprine (FLEXERIL) 10 MG tablet Take 1 tablet (10 mg total) by mouth 3 (three) times daily as needed. Patient not taking: Reported on 11/15/2015 09/14/15   Tammy Triplett, PA-C  dexamethasone (DECADRON) 6 MG tablet Take 1 tablet (6 mg total) by mouth 2 (two) times daily with a meal. 12/29/15   Ivery QualeHobson Cielo Arias, PA-C  doxycycline (VIBRAMYCIN) 100 MG capsule Take 1 capsule (100 mg total) by mouth 2 (two) times daily. Patient not taking: Reported on 11/15/2015 10/14/15   Jerelyn ScottMartha Linker, MD  hydrOXYzine (ATARAX/VISTARIL) 25 MG tablet Take 1 tablet (25 mg total) by mouth every 6 (six) hours. Patient not taking: Reported on 09/14/2015 09/02/15   Ivery QualeHobson Jazilyn Siegenthaler, PA-C  meclizine (ANTIVERT) 25 MG tablet Take 1 tablet (25 mg total) by mouth 3 (three) times daily as needed for dizziness. Patient not taking: Reported on 07/19/2015 05/20/15   Wilberto JesterKathleen McManus, DO  methocarbamol (ROBAXIN) 500 MG tablet  1 po bid for spasm pain 12/29/15   Ivery Quale, PA-C  naproxen (NAPROSYN) 500 MG tablet Take 1 tablet (500 mg total) by mouth 2 (two) times daily with a meal. Patient not taking: Reported on 08/07/2015 07/19/15   Tammy Triplett, PA-C  predniSONE (DELTASONE) 10 MG tablet 5,4,3,2,1 - take with food Patient not taking: Reported on 09/14/2015 09/02/15   Ivery Quale, PA-C   BP 129/82 mmHg  Pulse 74  Temp(Src) 97.4 F (36.3 C) (Oral)  Resp 18  Ht 5\' 9"  (1.753 m)  Wt 92.987 kg  BMI 30.26 kg/m2  SpO2  100% Physical Exam  Constitutional: He is oriented to person, place, and time. He appears well-developed and well-nourished.  Non-toxic appearance.  HENT:  Head: Normocephalic.  Right Ear: Tympanic membrane and external ear normal.  Left Ear: Tympanic membrane and external ear normal.  Eyes: EOM and lids are normal. Pupils are equal, round, and reactive to light.  Neck: Normal range of motion. Neck supple. Carotid bruit is not present.  Cardiovascular: Normal rate, regular rhythm, normal heart sounds, intact distal pulses and normal pulses.   Pulmonary/Chest: Breath sounds normal. No respiratory distress.  Abdominal: Soft. Bowel sounds are normal. There is no tenderness. There is no guarding.  Musculoskeletal: Normal range of motion.  Pain of the right anterior shoulder, before meals joint area, and mid bicep tricep area to palpation and with range of motion. The grip is symmetrical. There is no atrophy of the bicep tricep area, forearm, or thenar area. The radial pulses 2+ bilaterally. Capillary refill is less than 2 seconds.  Lymphadenopathy:       Head (right side): No submandibular adenopathy present.       Head (left side): No submandibular adenopathy present.    He has no cervical adenopathy.  Neurological: He is alert and oriented to person, place, and time. He has normal strength. No cranial nerve deficit or sensory deficit.  The grip is symmetrical. The motor strength is symmetrical. There is no embarrassment withadduction, or abduction. There no sensory deficits noted.  Gait is intact. Speech is clear and understandable.  Skin: Skin is warm and dry.  Psychiatric: He has a normal mood and affect. His speech is normal.  Nursing note and vitals reviewed.   ED Course  Pt fitted with sling for the right shoulder.  Procedures (including critical care time) Labs Review Labs Reviewed - No data to display  Imaging Review No results found. I have personally reviewed and evaluated  these images and lab results as part of my medical decision-making.   EKG Interpretation None      MDM  Patient has had problems with his shoulder for quite some time. Within the last week he has had a flareup of his right shoulder pain. He states that he has some weakness present. But not a new weakness. He is not dropping objects, but he has pain even when holding light objects such as a cup of coffee. The examination favors musculoskeletal related pain. No gross neurologic deficits appreciated. No evidence of cerebrovascular accident, or cervical spine compromise. There is some tightness and tenseness involving the right upper trapezius area, extending into the upper portion of the bicep area.  The patient is treated with Robaxin and Decadron. The patient is strongly advised to see orthopedic specialist concerning his shoulder. The patient acknowledges understanding of the discharge instructions. Patient request for "a few low-dose Vicodin", I explained to the patient that we would not use narcotic medication for  this issue at this time. I re-explained to the patient the management plan.    Final diagnoses:  Right shoulder pain    **I have reviewed nursing notes, vital signs, and all appropriate lab and imaging results for this patient.Ivery Quale, PA-C 12/29/15 1542  Marily Memos, MD 12/29/15 1850

## 2015-12-29 NOTE — Discharge Instructions (Signed)
Heat to your shoulder may be helpful. Please use Robaxin 2 times daily, and Decadron 2 times daily with food. Please see Dr. Romeo AppleHarrison, or the orthopedic specialist of your choice this summer as possible for evaluation and management of your shoulder pain. Heat Therapy Heat therapy can help ease sore, stiff, injured, and tight muscles and joints. Heat relaxes your muscles, which may help ease your pain. Heat therapy should only be used on old, pre-existing, or long-lasting (chronic) injuries. Do not use heat therapy unless told by your doctor. HOW TO USE HEAT THERAPY There are several different kinds of heat therapy, including:  Moist heat pack.  Warm water bath.  Hot water bottle.  Electric heating pad.  Heated gel pack.  Heated wrap.  Electric heating pad. GENERAL HEAT THERAPY RECOMMENDATIONS   Do not sleep while using heat therapy. Only use heat therapy while you are awake.  Your skin may turn pink while using heat therapy. Do not use heat therapy if your skin turns red.  Do not use heat therapy if you have new pain.  High heat or long exposure to heat can cause burns. Be careful when using heat therapy to avoid burning your skin.  Do not use heat therapy on areas of your skin that are already irritated, such as with a rash or sunburn. GET HELP IF:   You have blisters, redness, swelling (puffiness), or numbness.  You have new pain.  Your pain is worse. MAKE SURE YOU:  Understand these instructions.  Will watch your condition.  Will get help right away if you are not doing well or get worse.   This information is not intended to replace advice given to you by your health care provider. Make sure you discuss any questions you have with your health care provider.   Document Released: 03/01/2012 Document Revised: 12/29/2014 Document Reviewed: 01/31/2014 Elsevier Interactive Patient Education 2016 [employer] Inc[employer]  Shoulder Pain The shoulder is the joint that connects  your arm to your body. Muscles and band-like tissues that connect bones to muscles (tendons) hold the joint together. Shoulder pain is felt if an injury or medical problem affects one or more parts of the shoulder. HOME CARE   Put ice on the sore area.  Put ice in a plastic bag.  Place a towel between your skin and the bag.  Leave the ice on for 15-20 minutes, 03-04 times a day for the first 2 days.  Stop using cold packs if they do not help with the pain.  If you were given something to keep your shoulder from moving (sling; shoulder immobilizer), wear it as told. Only take it off to shower or bathe.  Move your arm as little as possible, but keep your hand moving to prevent puffiness (swelling).  Squeeze a soft ball or foam pad as much as possible to help prevent swelling.  Take medicine as told by your doctor. GET HELP IF:  You have progressing new pain in your arm, hand, or fingers.  Your hand or fingers get cold.  Your medicine does not help lessen your pain. GET HELP RIGHT AWAY IF:   Your arm, hand, or fingers are numb or tingling.  Your arm, hand, or fingers are puffy (swollen), painful, or turn white or blue. MAKE SURE YOU:   Understand these instructions.  Will watch your condition.  Will get help right away if you are not doing well or get worse.   This information is not intended to replace advice  given to you by your health care provider. Make sure you discuss any questions you have with your health care provider.   Document Released: 05/26/2008 Document Revised: 12/29/2014 Document Reviewed: 04/02/2015 Elsevier Interactive Patient Education [employer]

## 2015-12-29 NOTE — ED Notes (Signed)
Pt reports pain, weakness, and numbness in r arm x 1 week.  Pt says hurts to move arm and has difficulty gripping anything.

## 2015-12-31 ENCOUNTER — Telehealth: Payer: Self-pay | Admitting: Orthopedic Surgery

## 2015-12-31 NOTE — Telephone Encounter (Signed)
Patient called today, 12/31/15, inquiring about appointment [facility], most recently 12/29/15; previously seen there 12/05/15, 11/15/15, and 09/14/15.  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  I've called back to patient to further discuss appointment -- referral may be needed.  Left voice message to return call.  His ph # is [phone].

## 2015-12-31 NOTE — Telephone Encounter (Signed)
Patient's mother called back for patient, I made her aware that I would need to speak with the patient, per mother Gary Harrington told someone at the office that it is okay if we discuss information with her. Mother stated that patient does have [employer] [place], I made her aware with that type of insurance we will need a referral from his primary care physician and once we receive the referral we can then get him scheduled to see Dr. [name]. Patient's mother verbally stated okay and she will have patient get the referral.

## 2016-01-03 ENCOUNTER — Encounter (HOSPITAL_COMMUNITY): Payer: Self-pay | Admitting: Emergency Medicine

## 2016-01-03 ENCOUNTER — Emergency Department (HOSPITAL_COMMUNITY)
Admission: EM | Admit: 2016-01-03 | Discharge: 2016-01-04 | Disposition: A | Discharge status: Home / Self Care | Payer: Medicaid Other | Attending: Emergency Medicine | Admitting: Emergency Medicine

## 2016-01-03 ENCOUNTER — Emergency Department (HOSPITAL_COMMUNITY): Payer: Medicaid Other

## 2016-01-03 DIAGNOSIS — Z79899 Other long term (current) drug therapy: Secondary | ICD-10-CM | POA: Insufficient documentation

## 2016-01-03 DIAGNOSIS — Z880 Allergy status to penicillin: Secondary | ICD-10-CM | POA: Diagnosis not present

## 2016-01-03 DIAGNOSIS — R600 Localized edema: Secondary | ICD-10-CM | POA: Insufficient documentation

## 2016-01-03 DIAGNOSIS — G8929 Other chronic pain: Secondary | ICD-10-CM | POA: Diagnosis not present

## 2016-01-03 DIAGNOSIS — I10 Essential (primary) hypertension: Secondary | ICD-10-CM | POA: Diagnosis not present

## 2016-01-03 DIAGNOSIS — Z7982 Long term (current) use of aspirin: Secondary | ICD-10-CM | POA: Insufficient documentation

## 2016-01-03 DIAGNOSIS — R0602 Shortness of breath: Secondary | ICD-10-CM | POA: Diagnosis present

## 2016-01-03 DIAGNOSIS — J209 Acute bronchitis, unspecified: Secondary | ICD-10-CM | POA: Diagnosis not present

## 2016-01-03 DIAGNOSIS — F17210 Nicotine dependence, cigarettes, uncomplicated: Secondary | ICD-10-CM | POA: Insufficient documentation

## 2016-01-03 DIAGNOSIS — Z8673 Personal history of transient ischemic attack (TIA), and cerebral infarction without residual deficits: Secondary | ICD-10-CM | POA: Insufficient documentation

## 2016-01-03 DIAGNOSIS — Z7952 Long term (current) use of systemic steroids: Secondary | ICD-10-CM | POA: Diagnosis not present

## 2016-01-03 LAB — BASIC METABOLIC PANEL
Anion gap: 8 (ref 5–15)
BUN: 15 mg/dL (ref 6–20)
CO2: 24 mmol/L (ref 22–32)
CREATININE: 0.99 mg/dL (ref 0.61–1.24)
Calcium: 8.8 mg/dL — ABNORMAL LOW (ref 8.9–10.3)
Chloride: 104 mmol/L (ref 101–111)
Glucose, Bld: 100 mg/dL — ABNORMAL HIGH (ref 65–99)
POTASSIUM: 3.9 mmol/L (ref 3.5–5.1)
SODIUM: 136 mmol/L (ref 135–145)

## 2016-01-03 LAB — CBC WITH DIFFERENTIAL/PLATELET
BASOS ABS: 0.1 10*3/uL (ref 0.0–0.1)
BASOS PCT: 1 %
EOS ABS: 0.4 10*3/uL (ref 0.0–0.7)
Eosinophils Relative: 4 %
HCT: 40.1 % (ref 39.0–52.0)
HEMOGLOBIN: 13.1 g/dL (ref 13.0–17.0)
LYMPHS PCT: 27 %
Lymphs Abs: 2.8 10*3/uL (ref 0.7–4.0)
MCH: 21.7 pg — AB (ref 26.0–34.0)
MCHC: 32.7 g/dL (ref 30.0–36.0)
MCV: 66.3 fL — ABNORMAL LOW (ref 78.0–100.0)
MONO ABS: 0.7 10*3/uL (ref 0.1–1.0)
MONOS PCT: 6 %
NEUTROS ABS: 6.5 10*3/uL (ref 1.7–7.7)
Neutrophils Relative %: 62 %
PLATELETS: 235 10*3/uL (ref 150–400)
RBC: 6.05 MIL/uL — ABNORMAL HIGH (ref 4.22–5.81)
RDW: 15.6 % — ABNORMAL HIGH (ref 11.5–15.5)
WBC: 10.5 10*3/uL (ref 4.0–10.5)

## 2016-01-03 LAB — TROPONIN I: Troponin I: <0.03 ng/mL (ref <0.031)

## 2016-01-03 LAB — BRAIN NATRIURETIC PEPTIDE: B Natriuretic Peptide: 17 pg/mL (ref 0.0–100.0)

## 2016-01-03 MED ORDER — IPRATROPIUM-ALBUTEROL 0.5-2.5 (3) MG/3ML IN SOLN
3.0000 mL | Freq: Once | RESPIRATORY_TRACT | Status: AC
  Administered 2016-01-03: 3 mL via RESPIRATORY_TRACT
  Filled 2016-01-03: qty 3

## 2016-01-03 MED ORDER — ALBUTEROL SULFATE (2.5 MG/3ML) 0.083% IN NEBU
2.5000 mg | INHALATION_SOLUTION | Freq: Once | RESPIRATORY_TRACT | Status: AC
  Administered 2016-01-03: 2.5 mg via RESPIRATORY_TRACT
  Filled 2016-01-03: qty 3

## 2016-01-03 NOTE — ED Provider Notes (Signed)
CSN: [id number]     Arrival date & time 01/03/16  2007 History   First MD Initiated Contact with Patient 01/03/16 2258     Chief Complaint  Patient presents with  . Shortness of Breath     (Consider location/radiation/quality/duration/timing/severity/associated sxs/prior Treatment) The history is provided by the patient.   Gary Harrington is a 55 y.o. male with a history significant for HTN, CVA and 2ppd smoker presenting with a 5 day history of uri type symptoms including nasal congestion with clear rhinorrhea, non productive cough and subjective fever, now with shortness of breath which started this morning while he was sitting watching tv.  He denies chest pain but states his lungs hurt when he coughs.  He endorses intermittent wheezing and reports his sob is worsened with exertion and when supine.  He denies cardiac history.  He states he has been "retaining fluid" in his ankles for the past several months which is not worsened today.  He has had no medicines for his symptoms prior to arrival.     Past Medical History  Diagnosis Date  . Hypertension   . Chronic pain   . Headache   . Dizziness   . Stroke [facility])    Past Surgical History  Procedure Laterality Date  . Tonsillectomy     Family History  Problem Relation Age of Onset  . Stroke Father   . Heart failure Father    Social History  Substance Use Topics  . Smoking status: Current Every Day Smoker -- 0.50 packs/day for 27 years    Types: Cigarettes  . Smokeless tobacco: Never Used  . Alcohol Use: No    Review of Systems  Constitutional: Positive for fever. Negative for chills.  HENT: Positive for congestion, rhinorrhea and sore throat. Negative for ear pain, sinus pressure, trouble swallowing and voice change.   Eyes: Negative for discharge.  Respiratory: Positive for cough, shortness of breath and wheezing. Negative for stridor.   Cardiovascular: Positive for leg swelling. Negative for chest pain and  palpitations.  Gastrointestinal: Negative for abdominal pain.  Genitourinary: Negative.       Allergies  Codeine and Penicillins  Home Medications   Prior to Admission medications   Medication Sig Start Date End Date Taking? Authorizing Provider  ALPRAZolam Prudy Feeler) 1 MG tablet Take 1 tablet (1 mg total) by mouth 2 (two) times daily as needed for anxiety. 11/15/15   Burgess Amor, PA-C  aspirin EC 81 MG tablet Take 81 mg by mouth daily.      Historical Provider, MD  Aspirin-Acetaminophen-Caffeine (GOODY HEADACHE PO) Take 1 packet by mouth every 4 (four) hours.    Historical Provider, MD  azithromycin (ZITHROMAX) 250 MG tablet Take 1 tablet (250 mg total) by mouth daily. Take first 2 tablets together, then 1 every day until finished. Patient not taking: Reported on 11/15/2015 10/27/15   Elson Areas, PA-C  busPIRone (BUSPAR) 15 MG tablet Take 15 mg by mouth 3 (three) times daily.    Historical Provider, MD  clindamycin (CLEOCIN) 150 MG capsule Take 2 capsules (300 mg total) by mouth 4 (four) times daily. For 7 days Patient not taking: Reported on 08/07/2015 07/19/15   Tammy Triplett, PA-C  cyclobenzaprine (FLEXERIL) 10 MG tablet Take 1 tablet (10 mg total) by mouth 3 (three) times daily as needed. Patient not taking: Reported on 11/15/2015 09/14/15   Tammy Triplett, PA-C  dexamethasone (DECADRON) 6 MG tablet Take 1 tablet (6 mg total) by mouth 2 (two) times  daily with a meal. 12/29/15   Ivery QualeHobson Bryant, PA-C  doxycycline (VIBRAMYCIN) 100 MG capsule Take 1 capsule (100 mg total) by mouth 2 (two) times daily. 01/04/16   Burgess AmorJulie Latrece Nitta, PA-C  hydrOXYzine (ATARAX/VISTARIL) 25 MG tablet Take 1 tablet (25 mg total) by mouth every 6 (six) hours. Patient not taking: Reported on 09/14/2015 09/02/15   Ivery QualeHobson Bryant, PA-C  meclizine (ANTIVERT) 25 MG tablet Take 1 tablet (25 mg total) by mouth 3 (three) times daily as needed for dizziness. Patient not taking: Reported on 07/19/2015 05/20/15   Ashaad JesterKathleen McManus, DO   methocarbamol (ROBAXIN) 500 MG tablet 1 po bid for spasm pain 12/29/15   Ivery QualeHobson Bryant, PA-C  naproxen (NAPROSYN) 500 MG tablet Take 1 tablet (500 mg total) by mouth 2 (two) times daily with a meal. Patient not taking: Reported on 08/07/2015 07/19/15   Tammy Triplett, PA-C  predniSONE (DELTASONE) 10 MG tablet 5,4,3,2,1 - take with food Patient not taking: Reported on 09/14/2015 09/02/15   Ivery QualeHobson Bryant, PA-C  promethazine-dextromethorphan (PROMETHAZINE-DM) 6.25-15 MG/5ML syrup Take 5 mLs by mouth 4 (four) times daily as needed for cough. 01/04/16   Burgess AmorJulie Rion Catala, PA-C   BP 107/74 mmHg  Pulse 68  Temp(Src) 98.3 F (36.8 C) (Oral)  Resp 20  Ht 5\' 9"  (1.753 m)  Wt 97.07 kg  BMI 31.59 kg/m2  SpO2 93% Physical Exam  Constitutional: He is oriented to person, place, and time. He appears well-developed and well-nourished.  HENT:  Head: Normocephalic and atraumatic.  Nose: Mucosal edema and rhinorrhea present.  Mouth/Throat: Uvula is midline, oropharynx is clear and moist and mucous membranes are normal. No oropharyngeal exudate, posterior oropharyngeal edema, posterior oropharyngeal erythema or tonsillar abscesses.  Eyes: Conjunctivae are normal.  Neck: Normal range of motion.  Cardiovascular: Normal rate and normal heart sounds.   Pulmonary/Chest: Effort normal. No respiratory distress. He has decreased breath sounds. He has no wheezes. He has no rhonchi. He has rales in the right lower field and the left lower field.  Abdominal: Soft. There is no tenderness. There is no guarding.  Musculoskeletal: Normal range of motion.  Trace bilateral ankle edema.  No calf pain or ttp.  Neurological: He is alert and oriented to person, place, and time.  Skin: Skin is warm and dry. No rash noted.  Psychiatric: He has a normal mood and affect.    ED Course  Procedures (including critical care time) Labs Review Labs Reviewed  CBC WITH DIFFERENTIAL/PLATELET - Abnormal; Notable for the following:    RBC 6.05  (*)    MCV 66.3 (*)    MCH 21.7 (*)    RDW 15.6 (*)    All other components within normal limits  BASIC METABOLIC PANEL - Abnormal; Notable for the following:    Glucose, Bld 100 (*)    Calcium 8.8 (*)    All other components within normal limits  TROPONIN I  BRAIN NATRIURETIC PEPTIDE    Imaging Review Dg Chest 2 View  01/03/2016  CLINICAL DATA:  Acute shortness of breath, nonproductive cough. EXAM: CHEST  2 VIEW COMPARISON:  October 24, 2014. FINDINGS: The heart size and mediastinal contours are within normal limits. Both lungs are clear. No pneumothorax or pleural effusion is noted. Old left rib fractures are noted. IMPRESSION: No active cardiopulmonary disease. Electronically Signed   By: Lupita RaiderJames  Green Jr, M.D.   On: 01/03/2016 22:30   I have personally reviewed and evaluated these images and lab results as part of my medical decision-making.  EKG Interpretation   Date/Time:  Thursday January 03 2016 22:05:54 EST Ventricular Rate:  63 PR Interval:  172 QRS Duration: 86 QT Interval:  418 QTC Calculation: 428 R Axis:   41 Text Interpretation:  Sinus rhythm No significant change since last  tracing Confirmed by WARD,  DO, KRISTEN ([id number]) on 01/03/2016 11:58:14 PM      MDM   Final diagnoses:  Acute bronchitis, unspecified organism    Pt given albuterol/atrovent neb with breathing feeling much improved. Pt lying supine, no sob or c/o cp or wheeze.  He was given mdi with spacer for home use.  No wheezing on exam with improved aeration and movement.  Given pt is heavy smoker, will cover with doxycycline. Promethazine DM cough syrup.  Rest, f/u with pcp or return here for any worsened sx.    The patient appears reasonably screened and/or stabilized for discharge and I doubt any other medical condition or other [facility] requiring further screening, evaluation, or treatment in the ED at this time prior to discharge.     Burgess Amor, PA-C 01/04/16 0110  Layla Maw Ward, DO 01/04/16  1914

## 2016-01-03 NOTE — ED Notes (Signed)
Pt sob, onset today, no productive cough, flushed face

## 2016-01-04 MED ORDER — DOXYCYCLINE HYCLATE 100 MG PO CAPS: 100.0000 mg | ORAL_CAPSULE | Freq: Two times a day (BID) | ORAL | Status: DC

## 2016-01-04 MED ORDER — PROMETHAZINE-DM 6.25-15 MG/5ML PO SYRP: 5.0000 mL | ORAL_SOLUTION | Freq: Four times a day (QID) | ORAL | Status: DC | PRN

## 2016-01-04 MED ORDER — DOXYCYCLINE HYCLATE 100 MG PO TABS
100.0000 mg | ORAL_TABLET | Freq: Once | ORAL | Status: AC
  Administered 2016-01-04: 100 mg via ORAL
  Filled 2016-01-04: qty 1

## 2016-01-04 MED ORDER — ALBUTEROL SULFATE HFA 108 (90 BASE) MCG/ACT IN AERS
2.0000 | INHALATION_SPRAY | Freq: Once | RESPIRATORY_TRACT | Status: AC
  Administered 2016-01-04: 2 via RESPIRATORY_TRACT
  Filled 2016-01-04: qty 6.7

## 2016-01-04 NOTE — Discharge Instructions (Signed)

## 2016-01-09 NOTE — Telephone Encounter (Signed)
We have received the referral I called patient to schedule a appointment no answer left message.

## 2016-01-09 NOTE — Telephone Encounter (Signed)
Patient aware that appointment will be scheduled when referral is received.

## 2016-01-09 NOTE — Telephone Encounter (Signed)
Toniann Fail called from Dr. Allyne Gee office to schedule patient, I made her aware that we will need a referral for the patient before we can schedule.

## 2016-01-10 NOTE — Telephone Encounter (Signed)
Patient is scheduled for 01/30/16 at 2:50. Patient aware

## 2016-01-30 ENCOUNTER — Ambulatory Visit (INDEPENDENT_AMBULATORY_CARE_PROVIDER_SITE_OTHER): Payer: Medicaid Other

## 2016-01-30 ENCOUNTER — Ambulatory Visit (INDEPENDENT_AMBULATORY_CARE_PROVIDER_SITE_OTHER): Payer: Medicaid Other | Admitting: Orthopedic Surgery

## 2016-01-30 ENCOUNTER — Encounter: Payer: Self-pay | Admitting: Orthopedic Surgery

## 2016-01-30 VITALS — BP 122/80 | Ht 69.0 in | Wt 214.0 lb

## 2016-01-30 DIAGNOSIS — M4722 Other spondylosis with radiculopathy, cervical region: Secondary | ICD-10-CM | POA: Diagnosis not present

## 2016-01-30 DIAGNOSIS — M7551 Bursitis of right shoulder: Secondary | ICD-10-CM

## 2016-01-30 DIAGNOSIS — M25511 Pain in right shoulder: Secondary | ICD-10-CM

## 2016-01-30 DIAGNOSIS — M542 Cervicalgia: Secondary | ICD-10-CM | POA: Diagnosis not present

## 2016-01-30 MED ORDER — PREDNISONE 10 MG (48) PO TBPK: ORAL_TABLET | ORAL | Status: DC

## 2016-01-30 MED ORDER — METHOCARBAMOL 500 MG PO TABS: 500.0000 mg | ORAL_TABLET | Freq: Four times a day (QID) | ORAL | Status: DC | PRN

## 2016-01-30 NOTE — Patient Instructions (Addendum)
Call [facility] therapy dept to schedule therapy visits  Two new medications sent to your pharmacy  Joint Injection Care After Refer to this sheet in the next few days. These instructions provide you with information on caring for yourself after you have had a joint injection. Your caregiver also may give you more specific instructions. Your treatment has been planned according to current medical practices, but problems sometimes occur. Call your caregiver if you have any problems or questions after your procedure. After any type of joint injection, it is not uncommon to experience:  Soreness, swelling, or bruising around the injection site.  Mild numbness, tingling, or weakness around the injection site caused by the numbing medicine used before or with the injection. It also is possible to experience the following effects associated with the specific agent after injection:  Iodine-based contrast agents:  Allergic reaction (itching, hives, widespread redness, and swelling beyond the injection site).  Corticosteroids (These effects are rare.):  Allergic reaction.  Increased blood sugar levels (If you have diabetes and you notice that your blood sugar levels have increased, notify your caregiver).  Increased blood pressure levels.  Mood swings.  Hyaluronic acid in the use of viscosupplementation.  Temporary heat or redness.  Temporary rash and itching.  Increased fluid accumulation in the injected joint. These effects all should resolve within a day after your procedure.  HOME CARE INSTRUCTIONS  Limit yourself to light activity the day of your procedure. Avoid lifting heavy objects, bending, stooping, or twisting.  Take prescription or over-the-counter pain medication as directed by your caregiver.  You may apply ice to your injection site to reduce pain and swelling the day of your procedure. Ice may be applied 03-04 times:  Put ice in a plastic bag.  Place a towel between your  skin and the bag.  Leave the ice on for no longer than 15-20 minutes each time. SEEK IMMEDIATE MEDICAL CARE IF:   Pain and swelling get worse rather than better or extend beyond the injection site.  Numbness does not go away.  Blood or fluid continues to leak from the injection site.  You have chest pain.  You have swelling of your face or tongue.  You have trouble breathing or you become dizzy.  You develop a fever, chills, or severe tenderness at the injection site that last longer than 1 day. MAKE SURE YOU:  Understand these instructions.  Watch your condition.  Get help right away if you are not doing well or if you get worse. Document Released: 08/21/2011 Document Revised: 03/01/2012 Document Reviewed: 08/21/2011 [facility] Patient Information 2015 [place], [place]. This information is not intended to replace advice given to you by your health care provider. Make sure you discuss any questions you have with your health care provider.

## 2016-01-30 NOTE — Progress Notes (Signed)
Patient ID: Gary Harrington, male   DOB: 1961/11/18, 55 y.o.   MRN: [id number]  Chief complaint is pain right arm  HPI Gary Harrington is a 55 y.o. male.  Presents for evaluation of pain in his right arm and right shoulder with pain radiating from his shoulder down to his right hand. He says he has no history of any cervical spine problems but his imaging history shows that he had a CT scan in 2012 which showed multilevel spondylosis. He was treated with hydro[place]one [place] Dr[place] but he did not bring those with him. He's been taking Goody powders for pain. He says he's done a lot of locking and has worn his shoulder out.  Location of pain right shoulder quality dull ache severity 8 out of 10 duration several weeks' time and constant context no trauma modified factors movement associated symptoms numbness in the right hand  Review of Systems Review of Systems  Constitutional: Negative for fever, chills and unexpected weight change.  Musculoskeletal: Positive for neck pain.  Neurological: Positive for numbness. Negative for weakness.  All other systems reviewed and are negative.   Past Medical History  Diagnosis Date  . Hypertension   . Chronic pain   . Headache   . Dizziness   . Stroke [facility])     Past Surgical History  Procedure Laterality Date  . Tonsillectomy      Family History  Problem Relation Age of Onset  . Stroke Father   . Heart failure Father     Social History Social History  Substance Use Topics  . Smoking status: Current Every Day Smoker -- 0.50 packs/day for 27 years    Types: Cigarettes  . Smokeless tobacco: Never Used  . Alcohol Use: No    Allergies  Allergen Reactions  . Codeine Hives and Itching  . Penicillins Hives    Has patient had a PCN reaction causing immediate rash, facial/tongue/throat swelling, SOB or lightheadedness with hypotension:yes    Has patient had a PCN reaction causing severe rash  involving mucus membranes or skin necrosis: No Has patient had a PCN reaction that required hospitalization {Yes/No:[id number] Has patient had a PCN reaction occurring within the last 10 years:noNono If all of the above answers are "NO", then may proceed with Cephalosporin use.      Current Outpatient Prescriptions  Medication Sig Dispense Refill  . ALPRAZolam (XANAX) 1 MG tablet Take 1 tablet (1 mg total) by mouth 2 (two) times daily as needed for anxiety. 12 tablet 0  . aspirin EC 81 MG tablet Take 81 mg by mouth daily.      . Aspirin-Acetaminophen-Caffeine (GOODY HEADACHE PO) Take 1 packet by mouth every 4 (four) hours.    . busPIRone (BUSPAR) 15 MG tablet Take 15 mg by mouth 3 (three) times daily.    . clindamycin (CLEOCIN) 150 MG capsule Take 2 capsules (300 mg total) by mouth 4 (four) times daily. For 7 days (Patient not taking: Reported on 08/07/2015) 56 capsule 0  . cyclobenzaprine (FLEXERIL) 10 MG tablet Take 1 tablet (10 mg total) by mouth 3 (three) times daily as needed. (Patient not taking: Reported on 11/15/2015) 21 tablet 0  . dexamethasone (DECADRON) 6 MG tablet Take 1 tablet (6 mg total) by mouth 2 (two) times daily with a meal. 12 tablet 0  . doxycycline (VIBRAMYCIN) 100 MG capsule Take 1 capsule (100 mg total) by mouth 2 (two) times daily.  20 capsule 0  . hydrOXYzine (ATARAX/VISTARIL) 25 MG tablet Take 1 tablet (25 mg total) by mouth every 6 (six) hours. (Patient not taking: Reported on 09/14/2015) 15 tablet 0  . meclizine (ANTIVERT) 25 MG tablet Take 1 tablet (25 mg total) by mouth 3 (three) times daily as needed for dizziness. (Patient not taking: Reported on 07/19/2015) 15 tablet 0  . methocarbamol (ROBAXIN) 500 MG tablet 1 po bid for spasm pain 14 tablet 0  . naproxen (NAPROSYN) 500 MG tablet Take 1 tablet (500 mg total) by mouth 2 (two) times daily with a meal. (Patient not taking: Reported on 08/07/2015) 20 tablet 0  . predniSONE (DELTASONE) 10 MG tablet 5,4,3,2,1 - take with  food (Patient not taking: Reported on 09/14/2015) 15 tablet 0  . promethazine-dextromethorphan (PROMETHAZINE-DM) 6.25-15 MG/5ML syrup Take 5 mLs by mouth 4 (four) times daily as needed for cough. 100 mL 0   No current facility-administered medications for this visit.       Physical Exam Physical Exam Height  (1.753 m), weight 214 lb (97.07 kg). Appearance, there are no abnormalities in terms of appearance the patient was well-developed and well-nourished. The grooming and hygiene were normal.  Mental status orientation, there was normal alertness and orientation Mood pleasant Ambulatory status normal with no assistive devices  Examination of the cervical spine reveals tenderness in the midline and right cervical spine with increased muscle tension in the right trapezius muscle normal on the left decreased range of motion  Right shoulder Inspection no specific tenderness globally around the shoulder Range of motion painful range of motion 0-90 passive range of motion 0-180 Tests for stability ligaments are stable with apprehension Motor strength  grade 5 strength in the rotator cuff Skin warm dry and intact without laceration or ulceration or erythema Neurologic examination normal sensation to sharp pressure and position, no correlation deficits Upper extremities: Vascular examination normal pulses with warm extremity and normal capillary refill  The opposite extremity normal range of motion stability strength and alignment of the left upper extremity  Lower extremities alignment normal, no contracture subluxation atrophy or tremor in either limb, neurovascular exam intact  Data Reviewed Plain film of the right shoulder and cervical spine x-ray  CT CERVICAL SPINE 2012   Findings: No fractures identified involving the cervical spine. Sagittal reconstructed images demonstrate anatomic alignment. Marked disc space narrowing and endplate hypertrophic changes at C5- 6 and  C6-7.  Mild disc space narrowing at C4-5.  No spinal stenosis.  Facet joints intact throughout. Coronal reformatted images demonstrate an intact craniocervical junction, intact C1-C2 articulation, intact dens, and intact lateral masses.  Combination of facet and uncinate hypertrophy account for mild left foraminal stenosis at C4-5, severe right and moderate left foraminal stenosis at C5-6, and severe left and mild right foraminal stenosis at C6-7. Emphysematous changes noted in the lung apices.   IMPRESSION:   1.  No cervical spine fractures identified. 2.  Degenerative changes and multilevel foraminal stenoses as detailed above. 3.  Emphysematous changes in the lung apices.   Original Report Authenticated By: Arnell Sieving, M.D.  C-spine film shows uncovertebral joint arthrosis C4-C6 lateral x-ray shows normal lordosis  Shoulder x-ray was normal   Assessment  Right shoulder bursitis  Cervical spondylosis   Plan  Recommend injection subacromial space right shoulder Recommend physical therapy, Robaxin and prednisone Dosepak for cervical spondylosis  Follow-up in 4 weeks    Procedure note the subacromial injection shoulder RIGHT  Verbal consent was obtained to  inject the  RIGHT   Shoulder  Timeout was completed to confirm the injection site is a subacromial space of the  RIGHT  shoulder   Medication used Depo-Medrol 40 mg and lidocaine 1% 3 cc  Anesthesia was provided by ethyl chloride  The injection was performed in the RIGHT  posterior subacromial space. After pinning the skin with alcohol and anesthetized the skin with ethyl chloride the subacromial space was injected using a 20-gauge needle. There were no complications  Sterile dressing was applied.

## 2016-02-20 ENCOUNTER — Ambulatory Visit (HOSPITAL_COMMUNITY): Payer: Medicaid Other | Attending: Orthopedic Surgery

## 2016-02-20 ENCOUNTER — Telehealth (HOSPITAL_COMMUNITY): Payer: Self-pay

## 2016-02-20 NOTE — Telephone Encounter (Signed)
Therapist called and spoke to the pt at 4:45 pm this date with notification of missed PT eval visit at 4:00 pm. Pt reported that he "cracked a rib today", which led him to miss his PT eval appt. Pt will call the clinic back in order to reschedule his PT eval.   Bonnee Quin, PT, DPT

## 2016-02-26 ENCOUNTER — Ambulatory Visit: Payer: Medicaid Other | Admitting: Orthopedic Surgery

## 2016-04-22 ENCOUNTER — Emergency Department (HOSPITAL_COMMUNITY)
Admission: EM | Admit: 2016-04-22 | Discharge: 2016-04-22 | Disposition: A | Discharge status: Home / Self Care | Payer: Medicaid Other | Attending: Emergency Medicine | Admitting: Emergency Medicine

## 2016-04-22 ENCOUNTER — Encounter (HOSPITAL_COMMUNITY): Payer: Self-pay

## 2016-04-22 DIAGNOSIS — F419 Anxiety disorder, unspecified: Secondary | ICD-10-CM | POA: Insufficient documentation

## 2016-04-22 DIAGNOSIS — F17210 Nicotine dependence, cigarettes, uncomplicated: Secondary | ICD-10-CM | POA: Diagnosis not present

## 2016-04-22 DIAGNOSIS — Z8673 Personal history of transient ischemic attack (TIA), and cerebral infarction without residual deficits: Secondary | ICD-10-CM | POA: Diagnosis not present

## 2016-04-22 DIAGNOSIS — I10 Essential (primary) hypertension: Secondary | ICD-10-CM | POA: Diagnosis not present

## 2016-04-22 DIAGNOSIS — Z7982 Long term (current) use of aspirin: Secondary | ICD-10-CM | POA: Insufficient documentation

## 2016-04-22 DIAGNOSIS — Z760 Encounter for issue of repeat prescription: Secondary | ICD-10-CM | POA: Diagnosis present

## 2016-04-22 MED ORDER — ALPRAZOLAM 1 MG PO TABS: 1.0000 mg | ORAL_TABLET | Freq: Two times a day (BID) | ORAL | Status: DC | PRN

## 2016-04-22 MED ORDER — ALPRAZOLAM 0.5 MG PO TABS
1.0000 mg | ORAL_TABLET | Freq: Once | ORAL | Status: AC
  Administered 2016-04-22: 1 mg via ORAL
  Filled 2016-04-22: qty 2

## 2016-04-22 NOTE — ED Provider Notes (Signed)
CSN: [id number]     Arrival date & time 04/22/16  4098 History   First MD Initiated Contact with Patient 04/22/16 [phone]     Chief Complaint  Patient presents with  . Medication Refill     (Consider location/radiation/quality/duration/timing/severity/associated sxs/prior Treatment) HPI  Gary Harrington is a 55 y.o. male who presents to the Emergency Department requesting refill of his xanax.  He states that his PMD usually refills his medications monthly, but patient states that he has to see his PMD again before he will refill his medication and he ran out two days ago.  He reports feeling anxious, but denies fever, chills, chest pain, shortness of breath.     Past Medical History  Diagnosis Date  . Hypertension   . Chronic pain   . Headache   . Dizziness   . Stroke [facility])    Past Surgical History  Procedure Laterality Date  . Tonsillectomy     Family History  Problem Relation Age of Onset  . Stroke Father   . Heart failure Father    Social History  Substance Use Topics  . Smoking status: Current Every Day Smoker -- 0.50 packs/day for 27 years    Types: Cigarettes  . Smokeless tobacco: Never Used  . Alcohol Use: No    Review of Systems  Constitutional: Negative for fever and chills.  Respiratory: Negative for chest tightness and shortness of breath.   Cardiovascular: Negative for chest pain.  Gastrointestinal: Negative for nausea, vomiting and abdominal pain.  Musculoskeletal: Negative for gait problem.  Skin: Negative for rash.  Neurological: Negative for dizziness, tremors and headaches.  Psychiatric/Behavioral: Negative for suicidal ideas and decreased concentration. The patient is nervous/anxious.   All other systems reviewed and are negative.     Allergies  Codeine and Penicillins  Home Medications   Prior to Admission medications   Medication Sig Start Date End Date Taking? Authorizing Provider  ALPRAZolam Prudy Feeler) 1 MG tablet Take 1 tablet (1 mg total)  by mouth 2 (two) times daily as needed for anxiety. 11/15/15   Burgess Amor, PA-C  aspirin EC 81 MG tablet Take 81 mg by mouth daily.      Historical Provider, MD  Aspirin-Acetaminophen-Caffeine (GOODY HEADACHE PO) Take 1 packet by mouth every 4 (four) hours.    Historical Provider, MD  methocarbamol (ROBAXIN) 500 MG tablet Take 1 tablet (500 mg total) by mouth every 6 (six) hours as needed for muscle spasms. 01/30/16   Vickki Hearing, MD  predniSONE (STERAPRED UNI-PAK 48 TAB) 10 MG (48) TBPK tablet Use as directed 01/30/16   Vickki Hearing, MD   BP 129/85 mmHg  Pulse 66  Temp(Src) 97.7 F (36.5 C) (Oral)  Resp 18  Ht  (1.753 m)  Wt 88.451 kg  BMI 28.78 kg/m2  SpO2 100% Physical Exam  Constitutional: He is oriented to person, place, and time. He appears well-developed and well-nourished. No distress.  HENT:  Head: Normocephalic and atraumatic.  Mouth/Throat: Oropharynx is clear and moist.  Eyes: EOM are normal. Pupils are equal, round, and reactive to light.  Neck: Normal range of motion. Neck supple.  Cardiovascular: Normal rate and regular rhythm.   Pulmonary/Chest: Effort normal and breath sounds normal. No respiratory distress. He exhibits no tenderness.  Musculoskeletal: Normal range of motion. He exhibits no tenderness.  Lymphadenopathy:    He has no cervical adenopathy.  Neurological: He is alert and oriented to person, place, and time. He exhibits normal muscle tone. Coordination  normal.  Skin: Skin is warm and dry.  Psychiatric: Thought content normal.  Appears anxious  Nursing note and vitals reviewed.   ED Course  Procedures (including critical care time) Labs Review Labs Reviewed - No data to display  Imaging Review No results found. I have personally reviewed and evaluated these images and lab results as part of my medical decision-making.   EKG Interpretation None      MDM   Final diagnoses:  Anxiety    Pt is anxious appearing.  Vitals  stable.  Has appointment with PMD on May 11.  No clinical signs of withdrawal at this time. Pt appears stable for d/c and understands that he will need to f/u with PMD for further management    Pauline Ausammy Kieffer Blatz, PA-C 04/24/16 0936  Donnetta HutchingBrian Cook, MD 04/24/16 1510

## 2016-04-22 NOTE — Discharge Instructions (Signed)
Generalized Anxiety Disorder Generalized anxiety disorder (GAD) is a mental disorder. It interferes with life functions, including relationships, work, and school. GAD is different from normal anxiety, which everyone experiences at some point in their lives in response to specific life events and activities. Normal anxiety actually helps us prepare for and get through these life events and activities. Normal anxiety goes away after the event or activity is over.  GAD causes anxiety that is not necessarily related to specific events or activities. It also causes excess anxiety in proportion to specific events or activities. The anxiety associated with GAD is also difficult to control. GAD can vary from mild to severe. People with severe GAD can have intense waves of anxiety with physical symptoms (panic attacks).  SYMPTOMS The anxiety and worry associated with GAD are difficult to control. This anxiety and worry are related to many life events and activities and also occur more days than not for 6 months or longer. People with GAD also have three or more of the following symptoms (one or more in children):  Restlessness.   Fatigue.  Difficulty concentrating.   Irritability.  Muscle tension.  Difficulty sleeping or unsatisfying sleep. DIAGNOSIS GAD is diagnosed through an assessment by your health care provider. Your health care provider will ask you questions aboutyour mood,physical symptoms, and events in your life. Your health care provider may ask you about your medical history and use of alcohol or drugs, including prescription medicines. Your health care provider may also do a physical exam and blood tests. Certain medical conditions and the use of certain substances can cause symptoms similar to those associated with GAD. Your health care provider may refer you to a mental health specialist for further evaluation. TREATMENT The following therapies are usually used to treat GAD:    Medication. Antidepressant medication usually is prescribed for long-term daily control. Antianxiety medicines may be added in severe cases, especially when panic attacks occur.   Talk therapy (psychotherapy). Certain types of talk therapy can be helpful in treating GAD by providing support, education, and guidance. A form of talk therapy called cognitive behavioral therapy can teach you healthy ways to think about and react to daily life events and activities.  Stress managementtechniques. These include yoga, meditation, and exercise and can be very helpful when they are practiced regularly. A mental health specialist can help determine which treatment is best for you. Some people see improvement with one therapy. However, other people require a combination of therapies.   This information is not intended to replace advice given to you by your health care provider. Make sure you discuss any questions you have with your health care provider.   Document Released: 04/04/2013 Document Revised: 12/29/2014 Document Reviewed: 04/04/2013 Elsevier Interactive Patient Education 2016 [employer]  

## 2016-04-22 NOTE — ED Notes (Signed)
Pt reports ran out of his xanax 2 days ago and can't get an appt with his doctor until May 11.

## 2016-05-05 ENCOUNTER — Encounter (HOSPITAL_COMMUNITY): Payer: Self-pay | Admitting: *Deleted

## 2016-05-05 ENCOUNTER — Emergency Department (HOSPITAL_COMMUNITY)
Admission: EM | Admit: 2016-05-05 | Discharge: 2016-05-05 | Disposition: A | Discharge status: Home / Self Care | Payer: Medicaid Other | Attending: Emergency Medicine | Admitting: Emergency Medicine

## 2016-05-05 ENCOUNTER — Emergency Department (HOSPITAL_COMMUNITY): Payer: Medicaid Other

## 2016-05-05 DIAGNOSIS — J209 Acute bronchitis, unspecified: Secondary | ICD-10-CM | POA: Insufficient documentation

## 2016-05-05 DIAGNOSIS — I10 Essential (primary) hypertension: Secondary | ICD-10-CM | POA: Insufficient documentation

## 2016-05-05 DIAGNOSIS — R05 Cough: Secondary | ICD-10-CM | POA: Diagnosis present

## 2016-05-05 DIAGNOSIS — F17210 Nicotine dependence, cigarettes, uncomplicated: Secondary | ICD-10-CM | POA: Insufficient documentation

## 2016-05-05 DIAGNOSIS — Z8673 Personal history of transient ischemic attack (TIA), and cerebral infarction without residual deficits: Secondary | ICD-10-CM | POA: Diagnosis not present

## 2016-05-05 DIAGNOSIS — Z7982 Long term (current) use of aspirin: Secondary | ICD-10-CM | POA: Diagnosis not present

## 2016-05-05 MED ORDER — ALBUTEROL SULFATE HFA 108 (90 BASE) MCG/ACT IN AERS: 2.0000 | INHALATION_SPRAY | RESPIRATORY_TRACT | Status: DC | PRN

## 2016-05-05 MED ORDER — DEXAMETHASONE 4 MG PO TABS
12.0000 mg | ORAL_TABLET | Freq: Once | ORAL | Status: AC
  Administered 2016-05-05: 12 mg via ORAL
  Filled 2016-05-05: qty 3

## 2016-05-05 MED ORDER — IPRATROPIUM-ALBUTEROL 0.5-2.5 (3) MG/3ML IN SOLN
3.0000 mL | Freq: Once | RESPIRATORY_TRACT | Status: AC
  Administered 2016-05-05: 3 mL via RESPIRATORY_TRACT
  Filled 2016-05-05: qty 3

## 2016-05-05 MED ORDER — PREDNISONE 50 MG PO TABS: 60.0000 mg | ORAL_TABLET | Freq: Once | ORAL | Status: DC

## 2016-05-05 NOTE — ED Provider Notes (Signed)
CSN: [id number]     Arrival date & time 05/05/16  0358 History   First MD Initiated Contact with Patient 05/05/16 [phone]     Chief Complaint  Patient presents with  . Cough     (Consider location/radiation/quality/duration/timing/severity/associated sxs/prior Treatment) Patient is a 55 y.o. male presenting with cough. The history is provided by the patient.  Cough He has been complaining of a nonproductive cough for the last 4 days. He denies fever, chills, sweats. He denies dyspnea. However, he states that whenever he takes a breath in, his lungs or in fire. Symptoms are not affected by any other activity. He saw his primary care provider and was told that there was nothing wrong. He does have history of having had bronchitis in the past and had an inhaler at home, but was unable to find it.  Past Medical History  Diagnosis Date  . Hypertension   . Chronic pain   . Headache   . Dizziness   . Stroke [facility])    Past Surgical History  Procedure Laterality Date  . Tonsillectomy     Family History  Problem Relation Age of Onset  . Stroke Father   . Heart failure Father    Social History  Substance Use Topics  . Smoking status: Current Every Day Smoker -- 0.50 packs/day for 27 years    Types: Cigarettes  . Smokeless tobacco: Never Used  . Alcohol Use: No    Review of Systems  Respiratory: Positive for cough.   All other systems reviewed and are negative.     Allergies  Codeine and Penicillins  Home Medications   Prior to Admission medications   Medication Sig Start Date End Date Taking? Authorizing Provider  ALPRAZolam Prudy Feeler) 1 MG tablet Take 1 tablet (1 mg total) by mouth 2 (two) times daily as needed for anxiety. 04/22/16   Tammy Triplett, PA-C  aspirin EC 81 MG tablet Take 81 mg by mouth daily.      Historical Provider, MD  Aspirin-Acetaminophen-Caffeine (GOODY HEADACHE PO) Take 1 packet by mouth every 4 (four) hours.    Historical Provider, MD  methocarbamol  (ROBAXIN) 500 MG tablet Take 1 tablet (500 mg total) by mouth every 6 (six) hours as needed for muscle spasms. 01/30/16   Vickki Hearing, MD  predniSONE (STERAPRED UNI-PAK 48 TAB) 10 MG (48) TBPK tablet Use as directed 01/30/16   Vickki Hearing, MD   BP 117/68 mmHg  Pulse 64  Temp(Src) 98 F (36.7 C) (Oral)  Resp 18  Ht  (1.753 m)  Wt 200 lb (90.719 kg)  BMI 29.52 kg/m2  SpO2 97% Physical Exam  Nursing note and vitals reviewed.  55 year old male, resting comfortably and in no acute distress. Vital signs are normal. Oxygen saturation is 97%, which is normal. Head is normocephalic and atraumatic. PERRLA, EOMI. Oropharynx is clear. Neck is nontender and supple without adenopathy or JVD. Back is nontender and there is no CVA tenderness. Lungs have moderate expiratory wheezes without rales or rhonchi. Chest is nontender. Heart has regular rate and rhythm without murmur. Abdomen is soft, flat, nontender without masses or hepatosplenomegaly and peristalsis is normoactive. Extremities have no cyanosis or edema, full range of motion is present. Skin is warm and dry without rash. Neurologic: Mental status is normal, cranial nerves are intact, there are no motor or sensory deficits.  ED Course  Procedures (including critical care time)  Imaging Review Dg Chest 2 View  05/05/2016  CLINICAL DATA:  Dry cough for 4 days. History of hypertension, headache, stroke, smoker. EXAM: CHEST  2 VIEW COMPARISON:  Chest radiograph February eighth 2017 FINDINGS: Cardiomediastinal silhouette is normal. Mild bronchitic changes without pleural effusion or focal consolidation. No pneumothorax. Soft tissue planes and included osseous structure nonsuspicious. Old LEFT lateral rib fractures. IMPRESSION: Mild bronchitic changes without focal consolidation. Electronically Signed   By: Awilda Metroourtnay  Bloomer M.D.   On: 05/05/2016 06:17   I have personally reviewed and evaluated these lab results as part of my  medical decision-making.   MDM   Final diagnoses:  Acute bronchitis, unspecified organism    Cough with bronchospasm. He is sent for chest x-ray to rule out pneumonia. Old records are reviewed confirming prior ED visit for bronchitis. He is given albuterol with ipratropium via nebulizer with significant subjective relief. Exam following nebulizer treatment shows lungs are completely clear. He states that he breaks out in hives with prednisone as well as given a dose of dexamethasone.  Following nebulizer treatment, lungs are completely clear and patient states that he feels much better. At no longer feels like his lungs on fire. X-ray shows no evidence of pneumonia. No indication for antibiotics at this time. He is discharged with prescription for albuterol inhaler.  Dione Boozeavid Chriss Redel, MD 05/05/16 [phone]

## 2016-05-05 NOTE — Discharge Instructions (Signed)
Acute Bronchitis Bronchitis is inflammation of the airways that extend from the windpipe into the lungs (bronchi). The inflammation often causes mucus to develop. This leads to a cough, which is the most common symptom of bronchitis.  In acute bronchitis, the condition usually develops suddenly and goes away over time, usually in a couple weeks. Smoking, allergies, and asthma can make bronchitis worse. Repeated episodes of bronchitis may cause further lung problems.  CAUSES Acute bronchitis is most often caused by the same virus that causes a cold. The virus can spread from person to person (contagious) through coughing, sneezing, and touching contaminated objects. SIGNS AND SYMPTOMS   Cough.   Fever.   Coughing up mucus.   Body aches.   Chest congestion.   Chills.   Shortness of breath.   Sore throat.  DIAGNOSIS  Acute bronchitis is usually diagnosed through a physical exam. Your health care provider will also ask you questions about your medical history. Tests, such as chest X-rays, are sometimes done to rule out other conditions.  TREATMENT  Acute bronchitis usually goes away in a couple weeks. Oftentimes, no medical treatment is necessary. Medicines are sometimes given for relief of fever or cough. Antibiotic medicines are usually not needed but may be prescribed in certain situations. In some cases, an inhaler may be recommended to help reduce shortness of breath and control the cough. A cool mist vaporizer may also be used to help thin bronchial secretions and make it easier to clear the chest.  HOME CARE INSTRUCTIONS  Get plenty of rest.   Drink enough fluids to keep your urine clear or pale yellow (unless you have a medical condition that requires fluid restriction). Increasing fluids may help thin your respiratory secretions (sputum) and reduce chest congestion, and it will prevent dehydration.   Take medicines only as directed by your health care provider.  If  you were prescribed an antibiotic medicine, finish it all even if you start to feel better.  Avoid smoking and secondhand smoke. Exposure to cigarette smoke or irritating chemicals will make bronchitis worse. If you are a smoker, consider using nicotine gum or skin patches to help control withdrawal symptoms. Quitting smoking will help your lungs heal faster.   Reduce the chances of another bout of acute bronchitis by washing your hands frequently, avoiding people with cold symptoms, and trying not to touch your hands to your mouth, nose, or eyes.   Keep all follow-up visits as directed by your health care provider.  SEEK MEDICAL CARE IF: Your symptoms do not improve after 1 week of treatment.  SEEK IMMEDIATE MEDICAL CARE IF:  You develop an increased fever or chills.   You have chest pain.   You have severe shortness of breath.  You have bloody sputum.   You develop dehydration.  You faint or repeatedly feel like you are going to pass out.  You develop repeated vomiting.  You develop a severe headache. MAKE SURE YOU:   Understand these instructions.  Will watch your condition.  Will get help right away if you are not doing well or get worse.   This information is not intended to replace advice given to you by your health care provider. Make sure you discuss any questions you have with your health care provider.   Document Released: 01/15/2005 Document Revised: 12/29/2014 Document Reviewed: 05/31/2013 Elsevier Interactive Patient Education 2016 Elsevier Inc.   Albuterol inhalation aerosol What is this medicine? ALBUTEROL (al Gaspar Bidding) is a bronchodilator. It helps  open up the airways in your lungs to make it easier to breathe. This medicine is used to treat and to prevent bronchospasm. This medicine may be used for other purposes; ask your health care provider or pharmacist if you have questions. What should I tell my health care provider before I take this  medicine? They need to know if you have any of the following conditions: -diabetes -heart disease or irregular heartbeat -high blood pressure -pheochromocytoma -seizures -thyroid disease -an unusual or allergic reaction to albuterol, levalbuterol, sulfites, other medicines, foods, dyes, or preservatives -pregnant or trying to get pregnant -breast-feeding How should I use this medicine? This medicine is for inhalation through the mouth. Follow the directions on your prescription label. Take your medicine at regular intervals. Do not use more often than directed. Make sure that you are using your inhaler correctly. Ask you doctor or health care provider if you have any questions. Talk to your pediatrician regarding the use of this medicine in children. Special care may be needed. Overdosage: If you think you have taken too much of this medicine contact a poison control center or emergency room at once. NOTE: This medicine is only for you. Do not share this medicine with others. What if I miss a dose? If you miss a dose, use it as soon as you can. If it is almost time for your next dose, use only that dose. Do not use double or extra doses. What may interact with this medicine? -anti-infectives like chloroquine and pentamidine -caffeine -cisapride -diuretics -medicines for colds -medicines for depression or for emotional or psychotic conditions -medicines for weight loss including some herbal products -methadone -some antibiotics like clarithromycin, erythromycin, levofloxacin, and linezolid -some heart medicines -steroid hormones like dexamethasone, cortisone, hydrocortisone -theophylline -thyroid hormones This list may not describe all possible interactions. Give your health care provider a list of all the medicines, herbs, non-prescription drugs, or dietary supplements you use. Also tell them if you smoke, drink alcohol, or use illegal drugs. Some items may interact with your  medicine. What should I watch for while using this medicine? Tell your doctor or health care professional if your symptoms do not improve. Do not use extra albuterol. If your asthma or bronchitis gets worse while you are using this medicine, call your doctor right away. If your mouth gets dry try chewing sugarless gum or sucking hard candy. Drink water as directed. What side effects may I notice from receiving this medicine? Side effects that you should report to your doctor or health care professional as soon as possible: -allergic reactions like skin rash, itching or hives, swelling of the face, lips, or tongue -breathing problems -chest pain -feeling faint or lightheaded, falls -high blood pressure -irregular heartbeat -fever -muscle cramps or weakness -pain, tingling, numbness in the hands or feet -vomiting Side effects that usually do not require medical attention (report to your doctor or health care professional if they continue or are bothersome): -cough -difficulty sleeping -headache -nervousness or trembling -stomach upset -stuffy or runny nose -throat irritation -unusual taste This list may not describe all possible side effects. Call your doctor for medical advice about side effects. You may report side effects to FDA at 1-800-FDA-1088. Where should I keep my medicine? Keep out of the reach of children. Store at room temperature between 15 and 30 degrees C (59 and 86 degrees F). The contents are under pressure and may burst when exposed to heat or flame. Do not freeze. This medicine does not work  as well if it is too cold. Throw away any unused medicine after the expiration date. Inhalers need to be thrown away after the labeled number of puffs have been used or by the expiration date; whichever comes first. Ventolin HFA should be thrown away 12 months after removing from foil pouch. Check the instructions that come with your medicine. NOTE: This sheet is a summary. It may  not cover all possible information. If you have questions about this medicine, talk to your doctor, pharmacist, or health care provider.    2016, Elsevier/Gold Standard. (2013-05-26 10:57:17)  Smoking Hazards Smoking cigarettes is extremely bad for your health. Tobacco smoke has over 200 known poisons in it. It contains the poisonous gases nitrogen oxide and carbon monoxide. There are over 60 chemicals in tobacco smoke that cause cancer. Some of the chemicals found in cigarette smoke include:   Cyanide.   Benzene.   Formaldehyde.   Methanol (wood alcohol).   Acetylene (fuel used in welding torches).   Ammonia.  Even smoking lightly shortens your life expectancy by several years. You can greatly reduce the risk of medical problems for you and your family by stopping now. Smoking is the most preventable cause of death and disease in our society. Within days of quitting smoking, your circulation improves, you decrease the risk of having a heart attack, and your lung capacity improves. There may be some increased phlegm in the first few days after quitting, and it may take months for your lungs to clear up completely. Quitting for 10 years reduces your risk of developing lung cancer to almost that of a nonsmoker.  WHAT ARE THE RISKS OF SMOKING? Cigarette smokers have an increased risk of many serious medical problems, including:  Lung cancer.   Lung disease (such as pneumonia, bronchitis, and emphysema).   Heart attack and chest pain due to the heart not getting enough oxygen (angina).   Heart disease and peripheral blood vessel disease.   Hypertension.   Stroke.   Oral cancer (cancer of the lip, mouth, or voice box).   Bladder cancer.   Pancreatic cancer.   Cervical cancer.   Pregnancy complications, including premature birth.   Stillbirths and smaller newborn babies, birth defects, and genetic damage to sperm.   Early menopause.   Lower estrogen level  for women.   Infertility.   Facial wrinkles.   Blindness.   Increased risk of broken bones (fractures).   Senile dementia.   Stomach ulcers and internal bleeding.   Delayed wound healing and increased risk of complications during surgery. Because of secondhand smoke exposure, children of smokers have an increased risk of the following:   Sudden infant death syndrome (SIDS).   Respiratory infections.   Lung cancer.   Heart disease.   Ear infections.  WHY IS SMOKING ADDICTIVE? Nicotine is the chemical agent in tobacco that is capable of causing addiction or dependence. When you smoke and inhale, nicotine is absorbed rapidly into the bloodstream through your lungs. Both inhaled and noninhaled nicotine may be addictive.  WHAT ARE THE BENEFITS OF QUITTING?  There are many health benefits to quitting smoking. Some are:   The likelihood of developing cancer and heart disease decreases. Health improvements are seen almost immediately.   Blood pressure, pulse rate, and breathing patterns start returning to normal soon after quitting.   People who quit may see an improvement in their overall quality of life.  HOW DO YOU QUIT SMOKING? Smoking is an addiction with both physical and  psychological effects, and longtime habits can be hard to change. Your health care provider can recommend:  Programs and community resources, which may include group support, education, or therapy.  Replacement products, such as patches, gum, and nasal sprays. Use these products only as directed. Do not replace cigarette smoking with electronic cigarettes (commonly called e-cigarettes). The safety of e-cigarettes is unknown, and some may contain harmful chemicals. FOR MORE INFORMATION  American Lung Association: www.lung.org  American Cancer Society: www.cancer.org   This information is not intended to replace advice given to you by your health care provider. Make sure you discuss any  questions you have with your health care provider.   Document Released: 01/15/2005 Document Revised: 09/28/2013 Document Reviewed: 05/30/2013 Elsevier Interactive Patient Education [employer]

## 2016-05-05 NOTE — ED Notes (Signed)
Pt c/o dry cough x 4 days and states it has made his head hurt; pt states he went to his PCP and was told nothing was wrong with him

## 2016-05-11 ENCOUNTER — Emergency Department (HOSPITAL_COMMUNITY)
Admission: EM | Admit: 2016-05-11 | Discharge: 2016-05-11 | Disposition: A | Discharge status: Home / Self Care | Payer: Medicaid Other | Attending: Emergency Medicine | Admitting: Emergency Medicine

## 2016-05-11 ENCOUNTER — Encounter (HOSPITAL_COMMUNITY): Payer: Self-pay | Admitting: Emergency Medicine

## 2016-05-11 DIAGNOSIS — R05 Cough: Secondary | ICD-10-CM | POA: Diagnosis present

## 2016-05-11 DIAGNOSIS — J40 Bronchitis, not specified as acute or chronic: Secondary | ICD-10-CM | POA: Insufficient documentation

## 2016-05-11 DIAGNOSIS — I10 Essential (primary) hypertension: Secondary | ICD-10-CM | POA: Diagnosis not present

## 2016-05-11 DIAGNOSIS — Z7982 Long term (current) use of aspirin: Secondary | ICD-10-CM | POA: Diagnosis not present

## 2016-05-11 DIAGNOSIS — Z8673 Personal history of transient ischemic attack (TIA), and cerebral infarction without residual deficits: Secondary | ICD-10-CM | POA: Insufficient documentation

## 2016-05-11 DIAGNOSIS — F17210 Nicotine dependence, cigarettes, uncomplicated: Secondary | ICD-10-CM | POA: Diagnosis not present

## 2016-05-11 MED ORDER — PROMETHAZINE-DM 6.25-15 MG/5ML PO SYRP: 5.0000 mL | ORAL_SOLUTION | Freq: Four times a day (QID) | ORAL | Status: DC | PRN

## 2016-05-11 MED ORDER — DOXYCYCLINE HYCLATE 100 MG PO CAPS: 100.0000 mg | ORAL_CAPSULE | Freq: Two times a day (BID) | ORAL | Status: DC

## 2016-05-11 NOTE — ED Notes (Signed)
Pt reports np cough since 04/22/16. Pt states he was seen in ED last weekend and dx with bronchitis.

## 2016-05-11 NOTE — ED Provider Notes (Signed)
CSN: [id number]     Arrival date & time 05/11/16  0744 History   First MD Initiated Contact with Patient 05/11/16 0800     Chief Complaint  Patient presents with  . Cough     (Consider location/radiation/quality/duration/timing/severity/associated sxs/prior Treatment) HPI   Gary Harrington is a 55 y.o. male who presents to the Emergency Department complaining of persistent cough.  He states he has had symptoms for three weeks and began as a "cold".  He states the cough is non-productive and worse at night, waking him from sleep.  He was seen here on 05/05/16 for same and states he has been using his albuterol inhaler regularly but its not helping his symptoms.  He states he has had bronchitis in the past and symptoms feel similar, but he has never had it this long.  He denies fever, chills, hemoptysis, chest pain and shortness of breath.    Past Medical History  Diagnosis Date  . Hypertension   . Chronic pain   . Headache   . Dizziness   . Stroke [facility])    Past Surgical History  Procedure Laterality Date  . Tonsillectomy     Family History  Problem Relation Age of Onset  . Stroke Father   . Heart failure Father    Social History  Substance Use Topics  . Smoking status: Current Every Day Smoker -- 0.50 packs/day for 27 years    Types: Cigarettes  . Smokeless tobacco: Never Used  . Alcohol Use: No    Review of Systems  Constitutional: Negative for fever, chills and appetite change.  HENT: Positive for congestion. Negative for sore throat and trouble swallowing.   Respiratory: Positive for cough and chest tightness. Negative for shortness of breath and wheezing.   Cardiovascular: Negative for chest pain.  Gastrointestinal: Negative for nausea, vomiting and abdominal pain.  Genitourinary: Negative for dysuria.  Musculoskeletal: Negative for arthralgias.  Skin: Negative for rash.  Neurological: Negative for dizziness, weakness and numbness.  Hematological: Negative for  adenopathy.  All other systems reviewed and are negative.     Allergies  Codeine; Penicillins; and Prednisone  Home Medications   Prior to Admission medications   Medication Sig Start Date End Date Taking? Authorizing Provider  albuterol (PROVENTIL HFA;VENTOLIN HFA) 108 (90 Base) MCG/ACT inhaler Inhale 2 puffs into the lungs every 4 (four) hours as needed for wheezing or shortness of breath (or coughing). 05/05/16   Dione Booze, MD  ALPRAZolam Prudy Feeler) 1 MG tablet Take 1 tablet (1 mg total) by mouth 2 (two) times daily as needed for anxiety. 04/22/16   Flavio Lindroth, PA-C  aspirin EC 81 MG tablet Take 81 mg by mouth daily.      Historical Provider, MD  Aspirin-Acetaminophen-Caffeine (GOODY HEADACHE PO) Take 1 packet by mouth every 4 (four) hours.    Historical Provider, MD  methocarbamol (ROBAXIN) 500 MG tablet Take 1 tablet (500 mg total) by mouth every 6 (six) hours as needed for muscle spasms. 01/30/16   Vickki Hearing, MD  predniSONE (STERAPRED UNI-PAK 48 TAB) 10 MG (48) TBPK tablet Use as directed 01/30/16   Vickki Hearing, MD   BP 139/83 mmHg  Pulse 61  Temp(Src) 98 F (36.7 C)  Resp 16  Ht  (1.753 m)  Wt 88.451 kg  BMI 28.78 kg/m2  SpO2 98% Physical Exam  Constitutional: He is oriented to person, place, and time. He appears well-developed and well-nourished. No distress.  HENT:  Head: Normocephalic and atraumatic.  Right Ear: Tympanic membrane and ear canal normal.  Left Ear: Tympanic membrane and ear canal normal.  Mouth/Throat: Uvula is midline, oropharynx is clear and moist and mucous membranes are normal. No oropharyngeal exudate.  Eyes: EOM are normal. Pupils are equal, round, and reactive to light.  Neck: Normal range of motion, full passive range of motion without pain and phonation normal. Neck supple.  Cardiovascular: Normal rate, regular rhythm and normal heart sounds.   No murmur heard. Pulmonary/Chest: Effort normal and breath sounds normal. No  stridor. No respiratory distress. He has no wheezes. He has no rales. He exhibits no tenderness.  Lung sounds are CTA bilaterally  Musculoskeletal: He exhibits no edema.  Lymphadenopathy:    He has no cervical adenopathy.  Neurological: He is alert and oriented to person, place, and time. He exhibits normal muscle tone. Coordination normal.  Skin: Skin is warm and dry.  Psychiatric: He has a normal mood and affect.  Nursing note and vitals reviewed.   ED Course  Procedures (including critical care time) Labs Review Labs Reviewed - No data to display  Imaging Review No results found. I have personally reviewed and evaluated these images and lab results as part of my medical decision-making.   EKG Interpretation None     Previous CXR reviewed by me and considered in my MDM  MDM   Final diagnoses:  Bronchitis    Pt seen here 05/05/16 for same.  Using albuterol inhaler without relief, last used prior to arrival.  Pt reports hx of hives secondary to prednisone use. CXR from previous visit neg for PNA. Vitals stable and he is well appearing, non-toxic. No hypoxia, tachypnea or tachycardia.    tx plan includes doxy, promethazine-DM for cough, continue albuterol and close PMD f/u.      [name], PA-C 05/11/16 [id number]  Mancel BaleElliott Wentz, MD 05/11/16 [phone]

## 2016-07-09 ENCOUNTER — Encounter (HOSPITAL_COMMUNITY): Payer: Self-pay | Admitting: Emergency Medicine

## 2016-07-09 ENCOUNTER — Emergency Department (HOSPITAL_COMMUNITY)
Admission: EM | Admit: 2016-07-09 | Discharge: 2016-07-09 | Disposition: A | Discharge status: Home / Self Care | Payer: Medicaid Other | Attending: Emergency Medicine | Admitting: Emergency Medicine

## 2016-07-09 DIAGNOSIS — Z8673 Personal history of transient ischemic attack (TIA), and cerebral infarction without residual deficits: Secondary | ICD-10-CM | POA: Insufficient documentation

## 2016-07-09 DIAGNOSIS — Z79899 Other long term (current) drug therapy: Secondary | ICD-10-CM | POA: Diagnosis not present

## 2016-07-09 DIAGNOSIS — F17210 Nicotine dependence, cigarettes, uncomplicated: Secondary | ICD-10-CM | POA: Insufficient documentation

## 2016-07-09 DIAGNOSIS — Z7982 Long term (current) use of aspirin: Secondary | ICD-10-CM | POA: Diagnosis not present

## 2016-07-09 DIAGNOSIS — Z792 Long term (current) use of antibiotics: Secondary | ICD-10-CM | POA: Insufficient documentation

## 2016-07-09 DIAGNOSIS — K0889 Other specified disorders of teeth and supporting structures: Secondary | ICD-10-CM | POA: Diagnosis present

## 2016-07-09 DIAGNOSIS — I10 Essential (primary) hypertension: Secondary | ICD-10-CM | POA: Diagnosis not present

## 2016-07-09 MED ORDER — AMOXICILLIN-POT CLAVULANATE 875-125 MG PO TABS
1.0000 | ORAL_TABLET | Freq: Once | ORAL | Status: AC
  Administered 2016-07-09: 1 via ORAL
  Filled 2016-07-09: qty 1

## 2016-07-09 MED ORDER — AMOXICILLIN-POT CLAVULANATE 875-125 MG PO TABS: 1.0000 | ORAL_TABLET | Freq: Two times a day (BID) | ORAL | Status: DC

## 2016-07-09 NOTE — ED Notes (Signed)
Patient complaining of lower bilateral dental pain starting this morning.

## 2016-07-09 NOTE — ED Provider Notes (Signed)
CSN: [id number]     Arrival date & time 07/09/16  1641 History   First MD Initiated Contact with Patient 07/09/16 1709     Chief Complaint  Patient presents with  . Dental Pain   HPI   55 year old male presents today with complaints of dental pain. Patient reports symptoms started this morning with sharp pain to his lower jaw along the anterior lateral aspect. Patient reports that he's had dental pain before, none this severe. Patient reports tenderness to palpation, denies any swelling, erythema, warmth to touch. Patient denies any difficulty breathing or swallowing, fever chills, nausea or vomiting. Patient is requesting antibiotics as this has improved symptoms in the past. Patient reports he is taking Goody powders as needed for pain.  Past Medical History  Diagnosis Date  . Hypertension   . Chronic pain   . Headache   . Dizziness   . Stroke [facility])    Past Surgical History  Procedure Laterality Date  . Tonsillectomy     Family History  Problem Relation Age of Onset  . Stroke Father   . Heart failure Father    Social History  Substance Use Topics  . Smoking status: Current Every Day Smoker -- 0.50 packs/day for 27 years    Types: Cigarettes  . Smokeless tobacco: Never Used  . Alcohol Use: No    Review of Systems  All other systems reviewed and are negative.   Allergies  Codeine; Penicillins; and Prednisone  Home Medications   Prior to Admission medications   Medication Sig Start Date End Date Taking? Authorizing Provider  albuterol (PROVENTIL HFA;VENTOLIN HFA) 108 (90 Base) MCG/ACT inhaler Inhale 2 puffs into the lungs every 4 (four) hours as needed for wheezing or shortness of breath (or coughing). 05/05/16   Dione Booze, MD  ALPRAZolam Prudy Feeler) 1 MG tablet Take 1 tablet (1 mg total) by mouth 2 (two) times daily as needed for anxiety. 04/22/16   Tammy Triplett, PA-C  amoxicillin-clavulanate (AUGMENTIN) 875-125 MG tablet Take 1 tablet by mouth every 12 (twelve) hours.  07/09/16   Eyvonne Mechanic, PA-C  aspirin EC 81 MG tablet Take 81 mg by mouth daily.      Historical Provider, MD  Aspirin-Acetaminophen-Caffeine (GOODY HEADACHE PO) Take 1 packet by mouth every 4 (four) hours.    Historical Provider, MD  doxycycline (VIBRAMYCIN) 100 MG capsule Take 1 capsule (100 mg total) by mouth 2 (two) times daily. For 10 days 05/11/16   Tammy Triplett, PA-C  methocarbamol (ROBAXIN) 500 MG tablet Take 1 tablet (500 mg total) by mouth every 6 (six) hours as needed for muscle spasms. 01/30/16   Vickki Hearing, MD  predniSONE (STERAPRED UNI-PAK 48 TAB) 10 MG (48) TBPK tablet Use as directed 01/30/16   Vickki Hearing, MD  promethazine-dextromethorphan (PROMETHAZINE-DM) 6.25-15 MG/5ML syrup Take 5 mLs by mouth every 6 (six) hours as needed for cough. 05/11/16   Tammy Triplett, PA-C   BP 108/77 mmHg  Pulse 72  Temp(Src) 98.3 F (36.8 C) (Oral)  Resp 20  Ht  (1.753 m)  Wt 88.451 kg  BMI 28.78 kg/m2  SpO2 100% Physical Exam  Constitutional: He is oriented to person, place, and time. He appears well-developed and well-nourished. No distress.  HENT:  Head: Normocephalic and atraumatic.  Mouth/Throat: Uvula is midline, oropharynx is clear and moist and mucous membranes are normal. No oropharyngeal exudate, posterior oropharyngeal edema, posterior oropharyngeal erythema or tonsillar abscesses.  External exam shows no asymmetry of the jaw line or  face, no signs of obvious swelling, edema, infection. Full active range of motion of the jaw. Neck is supple with full active range of motion, no tenderness to palpation of the soft tissues  Gumline palpated no obvious signs of infection including warmth, redness, abscess, tenderness. Posterior oropharynx clear with no signs of infection, uvula is midline and rises with phonation, tonsils present and normal in size, symmetrical bilateral, tongue is normal soft touch with full active range of motion, floor mouth is soft  nontender.  Numerous dental caries and the remaining teeth, patient has nearly all of his teeth removed except for anterior lower teeth.  Eyes: Conjunctivae are normal. Pupils are equal, round, and reactive to light. Right eye exhibits no discharge. Left eye exhibits no discharge. No scleral icterus.  Neck: Normal range of motion. Neck supple. No JVD present. No tracheal deviation present. No thyromegaly present.  Pulmonary/Chest: Effort normal. No stridor.  Lymphadenopathy:    He has no cervical adenopathy.  Neurological: He is alert and oriented to person, place, and time. Coordination normal.  Skin: Skin is warm and dry. No rash noted. He is not diaphoretic. No erythema. No pallor.  Psychiatric: He has a normal mood and affect. His behavior is normal. Judgment and thought content normal.  Nursing note and vitals reviewed.   ED Course  Procedures (including critical care time) Labs Review Labs Reviewed - No data to display  Imaging Review No results found. I have personally reviewed and evaluated these images and lab results as part of my medical decision-making.   EKG Interpretation None      MDM   Final diagnoses:  Pain, dental    Labs:  Imaging:  Consults:  Therapeutics: Augmentin  Discharge Meds: Augmentin  Assessment/Plan:  [age] year old male presents today with complaints of dental pain. Patient has uncomplicated dental pain, no obvious signs of infection on exam. Patient reports that he is taking antibiotics in the past that have improved symptoms. Patient will be given antibiotics here pending dental follow-up. Patient given strict return precautions encouraged to discontinue using BC powders, Tylenol as needed for pain, ice. Patient verbalized understanding and agreement today's plan had no further questions or concerns at time of discharge.  Patient has an allergy to penicillins, he reports that he's taken Augmentin previously with no adverse  events.         Eyvonne MechanicJeffrey Aleister Lady, PA-C 07/09/16 1738  Eber HongBrian Miller, MD 07/11/16 [phone]

## 2016-07-09 NOTE — Discharge Instructions (Signed)
Please follow-up with dental resources for further evaluation and management. If he experiences any new or worsening signs or symptoms please follow-up with her primary care for reevaluation.  [employer] [facility] [employer] is a great source of information about community services available.  Access by dialing 2-1-1 from anywhere in [place], or by website -  PooledIncome.pl.   Other Local Resources (Updated 12/2015)  Dental  Care   Services    Phone Number and Address  Cost  [place] [facility] For children 60 - 80 years of age:   Cleaning  Tooth brushing/flossing instruction  Sealants, fillings, crowns  Extractions  Emergency treatment  [phone] [address] [place], [place] [address] Charges based on family income.  Medicaid and some insurance plans accepted.     [facility], fillings, crowns  Extractions  Emergency treatment [phone] [address] [place], [place]  Pregnant women 19 years of age or older with a Medicaid card  [facility] - [place]  Cleaning  Sealants, fillings, crowns  Extractions  Emergency treatment [phone] [address] [place], [place] Pregnant women 40 years of age or older with a Medicaid card  [facility] Department of Health - [facility] For children 24 - 55 years of age:   Cleaning  Tooth brushing/flossing instruction  Sealants, fillings, crowns  Extractions  Emergency treatment Limited orthodontic services for patients with Medicaid [phone] [place], [place] [address] Medicaid and [facility] Health Choice cover for children up to age 67 and pregnant women.  Parents of children up to age 64 without Medicaid pay a reduced fee at time of service.  [facility] of [employer] For children 0 - 21 years  of age:   Cleaning  Tooth brushing/flossing instruction  Sealants, fillings, crowns  Extractions  Emergency treatment Limited orthodontic services for patients with Medicaid [phone] [address] [place], [place].  Medicaid and [place] Health Choice cover for children up to age 45 and pregnant women.  Parents of children up to age 71 without Medicaid pay a reduced fee.  [facility]  Sealants, fillings, crowns  Extractions  Hours: Tuesdays and Thursdays, 4:15 - 8 pm [phone] [address] [place], [place] [address] Services free of charge to [facility] residents ages 18-64 who do not have health insurance, Medicare, [place], or [place] benefits and fall within federal poverty guidelines  [employer]    Provides dental care in addition to primary medical care, nutritional counseling, and pharmacy:  Nurse, mental health, fillings, crowns  Extractions                  [phone] [facility], [address] [place], [place]  [phone] [facility], [address] [place] [place], [place]  [phone] [facility] [place], [place]  [phone] [facility], [address] [place], [place]  [phone] [facility] [address] [place], [place] Accepts [place], [place], most insurance.  Also provides services available to all with fees adjusted based on ability to pay.    [facility]  Cleaning  Tooth brushing/flossing instruction  Sealants, fillings, crowns  Extractions  Emergency treatment Hours: Tuesdays, Thursdays, and Fridays from 8 am to 5 pm  by appointment only. [phone] [address] [place], [place] [address] [facility] residents with Medicaid (depending on eligibility) and children with [facility] Health Choice - call  for more information.  [facility]  Extractions only  Hours: 2nd and 4th Thursday of each month from 6:30 am - 9 am.   [phone] [place], [place] [address] Ages 1518 and older only.  Patients are seen on a first come, first served basis.  [employer]  [employer]  Fillings  Extractions  Orthodontics  Endodontics  Implants/Crowns/Bridges  Complete and partial dentures [phone] [place], [place] Patients must complete an application for services.  There is often a waiting list.   Dental Pain Dental pain may be caused by many things, including:  Tooth decay (cavities or caries). Cavities expose the nerve of your tooth to air and hot or cold temperatures. This can cause pain or discomfort.  Abscess or infection. A dental abscess is a collection of infected pus from a bacterial infection in the inner part of the tooth (pulp). It usually occurs at the end of the tooth's root.  Injury.  An unknown reason (idiopathic). Your pain may be mild or severe. It may only occur when:  You are chewing.  You are exposed to hot or cold temperature.  You are eating or drinking sugary foods or beverages, such as soda or candy. Your pain may also be constant. HOME CARE INSTRUCTIONS Watch your dental pain for any changes. The following actions may help to lessen any discomfort that you are feeling:  Take medicines only as directed by your dentist.  If you were prescribed an antibiotic medicine, finish all of it even if you start to feel better.  Keep all follow-up visits as directed by your dentist. This is important.  Do not apply heat to the outside of your face.  Rinse your mouth or gargle with salt water if directed by your dentist. This helps with pain and swelling.  You can make salt water by adding  tsp of salt to 1 cup of warm water.  Apply ice to the painful area of your face:  Put ice in a plastic bag.  Place a towel  between your skin and the bag.  Leave the ice on for 20 minutes, 2-3 times per day.  Avoid foods or drinks that cause you pain, such as:  Very hot or very cold foods or drinks.  Sweet or sugary foods or drinks. SEEK MEDICAL CARE IF:  Your pain is not controlled with medicines.  Your symptoms are worse.  You have new symptoms. SEEK IMMEDIATE MEDICAL CARE IF:  You are unable to open your mouth.  You are having trouble breathing or swallowing.  You have a fever.  Your face, neck, or jaw is swollen.   This information is not intended to replace advice given to you by your health care provider. Make sure you discuss any questions you have with your health care provider.   Document Released: 12/08/2005 Document Revised: 04/24/2015 Document Reviewed: 12/04/2014 Elsevier Interactive Patient Education [employer]2016 Elsevier Inc.

## 2016-07-10 ENCOUNTER — Telehealth (HOSPITAL_COMMUNITY): Payer: Self-pay | Admitting: Emergency Medicine

## 2016-07-10 MED ORDER — CLINDAMYCIN HCL 150 MG PO CAPS: 150.0000 mg | ORAL_CAPSULE | Freq: Four times a day (QID) | ORAL | Status: DC

## 2016-07-10 NOTE — Telephone Encounter (Signed)
Call from pharmacy with patient there, now saying he cannot take augmentin given PCN allergy although indicated ytd while here he could.  Switch to clindamycin 150 qid #28.

## 2016-07-13 ENCOUNTER — Encounter (HOSPITAL_COMMUNITY): Payer: Self-pay | Admitting: *Deleted

## 2016-07-13 ENCOUNTER — Emergency Department (HOSPITAL_COMMUNITY)
Admission: EM | Admit: 2016-07-13 | Discharge: 2016-07-13 | Disposition: A | Discharge status: Home / Self Care | Payer: Medicaid Other | Attending: Emergency Medicine | Admitting: Emergency Medicine

## 2016-07-13 DIAGNOSIS — I10 Essential (primary) hypertension: Secondary | ICD-10-CM | POA: Insufficient documentation

## 2016-07-13 DIAGNOSIS — F17210 Nicotine dependence, cigarettes, uncomplicated: Secondary | ICD-10-CM | POA: Insufficient documentation

## 2016-07-13 DIAGNOSIS — Z8673 Personal history of transient ischemic attack (TIA), and cerebral infarction without residual deficits: Secondary | ICD-10-CM | POA: Insufficient documentation

## 2016-07-13 DIAGNOSIS — F419 Anxiety disorder, unspecified: Secondary | ICD-10-CM | POA: Diagnosis not present

## 2016-07-13 DIAGNOSIS — R51 Headache: Secondary | ICD-10-CM | POA: Diagnosis present

## 2016-07-13 DIAGNOSIS — Z7982 Long term (current) use of aspirin: Secondary | ICD-10-CM | POA: Insufficient documentation

## 2016-07-13 DIAGNOSIS — Z79899 Other long term (current) drug therapy: Secondary | ICD-10-CM | POA: Insufficient documentation

## 2016-07-13 MED ORDER — HYDROXYZINE HCL 25 MG PO TABS
25.0000 mg | ORAL_TABLET | Freq: Once | ORAL | Status: AC
  Administered 2016-07-13: 25 mg via ORAL
  Filled 2016-07-13: qty 1

## 2016-07-13 NOTE — ED Triage Notes (Signed)
Pt states he is withdrawing from his xanax; last dose yesterday at 1pm; pt states he gets 20 a month and he is not schedule to get more for 2 more days

## 2016-07-13 NOTE — ED Provider Notes (Signed)
[facility]-EMERGENCY DEPT Provider Note   CSN: [id number] Arrival date & time: 07/13/16  0607  First Provider Contact:  First MD Initiated Contact with Patient 07/13/16 [phone]        History   Chief Complaint Chief Complaint  Patient presents with  . medication withdrawal    HPI Gary Harrington is a 55 y.o. male. Who presents for evaluation of headache. He relates his headache as being related to being "out of Xanax". He takes Xanax sporadically stating he received 20 per month by his PCP. He drove his vehicle here for evaluation today. He is in the ED 2 days ago and treated for dental pain with clindamycin for possible infection. He has very poor dentition. He has frequent ED visits. He has a care plan. He denies fever, chills, nausea, vomiting, cough, shortness of breath, weakness or dizziness. There are no other known modifying factors.  HPI  Past Medical History:  Diagnosis Date  . Chronic pain   . Dizziness   . Headache   . Hypertension   . Stroke [facility])     There are no active problems to display for this patient.   Past Surgical History:  Procedure Laterality Date  . TONSILLECTOMY         Home Medications    Prior to Admission medications   Medication Sig Start Date End Date Taking? Authorizing Provider  albuterol (PROVENTIL HFA;VENTOLIN HFA) 108 (90 Base) MCG/ACT inhaler Inhale 2 puffs into the lungs every 4 (four) hours as needed for wheezing or shortness of breath (or coughing). 05/05/16   Dione Booze, MD  ALPRAZolam Prudy Feeler) 1 MG tablet Take 1 tablet (1 mg total) by mouth 2 (two) times daily as needed for anxiety. 04/22/16   Tammy Triplett, PA-C  amoxicillin-clavulanate (AUGMENTIN) 875-125 MG tablet Take 1 tablet by mouth every 12 (twelve) hours. 07/09/16   Eyvonne Mechanic, PA-C  aspirin EC 81 MG tablet Take 81 mg by mouth daily.      Historical Provider, MD  Aspirin-Acetaminophen-Caffeine (GOODY HEADACHE PO) Take 1 packet by mouth every 4 (four) hours.    Historical  Provider, MD  clindamycin (CLEOCIN) 150 MG capsule Take 1 capsule (150 mg total) by mouth every 6 (six) hours. 07/10/16   Burgess Amor, PA-C  doxycycline (VIBRAMYCIN) 100 MG capsule Take 1 capsule (100 mg total) by mouth 2 (two) times daily. For 10 days 05/11/16   Tammy Triplett, PA-C  methocarbamol (ROBAXIN) 500 MG tablet Take 1 tablet (500 mg total) by mouth every 6 (six) hours as needed for muscle spasms. 01/30/16   Vickki Hearing, MD  predniSONE (STERAPRED UNI-PAK 48 TAB) 10 MG (48) TBPK tablet Use as directed 01/30/16   Vickki Hearing, MD  promethazine-dextromethorphan (PROMETHAZINE-DM) 6.25-15 MG/5ML syrup Take 5 mLs by mouth every 6 (six) hours as needed for cough. 05/11/16   Tammy Triplett, PA-C    Family History Family History  Problem Relation Age of Onset  . Stroke Father   . Heart failure Father     Social History Social History  Substance Use Topics  . Smoking status: Current Every Day Smoker    Packs/day: 0.50    Years: 27.00    Types: Cigarettes  . Smokeless tobacco: Never Used  . Alcohol use No     Allergies   Codeine; Penicillins; and Prednisone   Review of Systems Review of Systems  All other systems reviewed and are negative.    Physical Exam Updated Vital Signs BP 121/80 (BP Location: Left  Arm)   Pulse (!) 57   Temp 97.7 F (36.5 C) (Oral)   Resp 20   Ht  (1.753 m)   Wt 200 lb (90.7 kg)   SpO2 97%   BMI 29.53 kg/m   Physical Exam  Constitutional: He appears well-developed and well-nourished.  HENT:  Head: Normocephalic and atraumatic.  Poor dentition. No trismus.  Eyes: Conjunctivae are normal.  Neck: Neck supple.  Cardiovascular: Normal rate.   No murmur heard. Pulmonary/Chest: Effort normal. No respiratory distress.  Abdominal: Soft. There is no tenderness.  Musculoskeletal: He exhibits no edema.  Normal gait  Neurological: He is alert.  No dysarthria and aphasia or nystagmus.  Skin: Skin is warm and dry.  Psychiatric: He has  a normal mood and affect.  Nursing note and vitals reviewed.    ED Treatments / Results  Labs (all labs ordered are listed, but only abnormal results are displayed) Labs Reviewed - No data to display  EKG  EKG Interpretation None       Radiology No results found.  Procedures Procedures (including critical care time)  Medications Ordered in ED Medications  hydrOXYzine (ATARAX/VISTARIL) tablet 25 mg (25 mg Oral Given 07/13/16 0813)     Initial Impression / Assessment and Plan / ED Course  I have reviewed the triage vital signs and the nursing notes.  Pertinent labs & imaging results that were available during my care of the patient were reviewed by me and considered in my medical decision making (see chart for details).  Clinical Course    BP 121/80 (BP Location: Left Arm)   Pulse (!) 57   Temp 97.7 F (36.5 C) (Oral)   Resp 20   Ht  (1.753 m)   Wt 200 lb (90.7 kg)   SpO2 97%   BMI 29.53 kg/m    Medications  hydrOXYzine (ATARAX/VISTARIL) tablet 25 mg (25 mg Oral Given 07/13/16 0813)    No data found.   At d/c- Reevaluation with update and discussion. After initial assessment and treatment, an updated evaluation reveals he feels better. Ayeden Gladman L    Final Clinical Impressions(s) / ED Diagnoses   Final diagnoses:  Anxiety   Anxiety. Care plan reviewed. Improved with vistaril. Chronic problem. No indicator for acute interventions or new meds. He has access to appropriate care.   Nursing Notes Reviewed/ Care Coordinated Applicable Imaging Reviewed Interpretation of Laboratory Data incorporated into ED treatment  The patient appears reasonably screened and/or stabilized for discharge and I doubt any other medical condition or other [facility] requiring further screening, evaluation, or treatment in the ED at this time prior to discharge.  Plan: Home Medications- continue; Home Treatments- rest; return here if the recommended treatment, does not  improve the symptoms; Recommended follow up- PCP prn    New Prescriptions Discharge Medication List as of 07/13/2016  8:38 AM       Mancel Bale, MD 07/14/16 [phone]

## 2016-07-14 ENCOUNTER — Encounter (HOSPITAL_COMMUNITY): Payer: Self-pay | Admitting: Emergency Medicine

## 2016-11-18 ENCOUNTER — Emergency Department (HOSPITAL_COMMUNITY)
Admission: EM | Admit: 2016-11-18 | Discharge: 2016-11-18 | Disposition: A | Discharge status: Home / Self Care | Payer: Medicaid Other | Attending: Emergency Medicine | Admitting: Emergency Medicine

## 2016-11-18 DIAGNOSIS — Z7982 Long term (current) use of aspirin: Secondary | ICD-10-CM | POA: Diagnosis not present

## 2016-11-18 DIAGNOSIS — Z79899 Other long term (current) drug therapy: Secondary | ICD-10-CM | POA: Diagnosis not present

## 2016-11-18 DIAGNOSIS — F17210 Nicotine dependence, cigarettes, uncomplicated: Secondary | ICD-10-CM | POA: Insufficient documentation

## 2016-11-18 DIAGNOSIS — R040 Epistaxis: Secondary | ICD-10-CM | POA: Insufficient documentation

## 2016-11-18 DIAGNOSIS — I10 Essential (primary) hypertension: Secondary | ICD-10-CM | POA: Diagnosis not present

## 2016-11-18 MED ORDER — OXYMETAZOLINE HCL 0.05 % NA SOLN
1.0000 | Freq: Once | NASAL | Status: AC
  Administered 2016-11-18: 04:00:00 via NASAL

## 2016-11-18 MED ORDER — OXYMETAZOLINE HCL 0.05 % NA SOLN
NASAL | Status: AC
  Filled 2016-11-18: qty 15

## 2016-11-18 NOTE — ED Provider Notes (Signed)
[facility] DEPT Provider Note   CSN: [id number] Arrival date & time: 11/18/16  0215  Time seen 03:10 AM   History   Chief Complaint Chief Complaint  Patient presents with  . Epistaxis    HPI Gary HearingSamuel T Harrington is a 55 y.o. male.  HPI  patient states he has been fine. He states he got up to go to the bathroom about an hour ago and he blew his nose and he started having bleeding from both nostrils although he does relate the right is worse than left. He denies any coughing or sneezing before hand. He states he's never had this before. Take it is starting to have some clots now. He does admit to taking 3-4 Goody powders a day for chronic headaches and arm pain. He states whenever he gets cut he does bleed longer than he thinks he should.  Dolores FrameHEADEN,KENNETH, MD PCP   Past Medical History:  Diagnosis Date  . Chronic pain   . Dizziness   . Headache   . Hypertension   . Stroke [facility](HCC)     There are no active problems to display for this patient.   Past Surgical History:  Procedure Laterality Date  . TONSILLECTOMY         Home Medications    States he is only take goody's and xanax  Prior to Admission medications   Medication Sig Start Date End Date Taking? Authorizing Provider  albuterol (PROVENTIL HFA;VENTOLIN HFA) 108 (90 Base) MCG/ACT inhaler Inhale 2 puffs into the lungs every 4 (four) hours as needed for wheezing or shortness of breath (or coughing). 05/05/16   Dione Boozeavid Glick, MD  ALPRAZolam Prudy Feeler(XANAX) 1 MG tablet Take 1 tablet (1 mg total) by mouth 2 (two) times daily as needed for anxiety. 04/22/16   Tammy Triplett, PA-C  amoxicillin-clavulanate (AUGMENTIN) 875-125 MG tablet Take 1 tablet by mouth every 12 (twelve) hours. 07/09/16   Eyvonne MechanicJeffrey Hedges, PA-C  aspirin EC 81 MG tablet Take 81 mg by mouth daily.      Historical Provider, MD  Aspirin-Acetaminophen-Caffeine (GOODY HEADACHE PO) Take 1 packet by mouth every 4 (four) hours.    Historical Provider, MD  clindamycin  (CLEOCIN) 150 MG capsule Take 1 capsule (150 mg total) by mouth every 6 (six) hours. 07/10/16   Burgess AmorJulie Idol, PA-C  doxycycline (VIBRAMYCIN) 100 MG capsule Take 1 capsule (100 mg total) by mouth 2 (two) times daily. For 10 days 05/11/16   Tammy Triplett, PA-C  methocarbamol (ROBAXIN) 500 MG tablet Take 1 tablet (500 mg total) by mouth every 6 (six) hours as needed for muscle spasms. 01/30/16   Vickki HearingStanley E Harrison, MD  predniSONE (STERAPRED UNI-PAK 48 TAB) 10 MG (48) TBPK tablet Use as directed 01/30/16   Vickki HearingStanley E Harrison, MD  promethazine-dextromethorphan (PROMETHAZINE-DM) 6.25-15 MG/5ML syrup Take 5 mLs by mouth every 6 (six) hours as needed for cough. 05/11/16   Tammy Triplett, PA-C    Family History Family History  Problem Relation Age of Onset  . Stroke Father   . Heart failure Father     Social History Social History  Substance Use Topics  . Smoking status: Current Every Day Smoker    Packs/day: 0.50    Years: 27.00    Types: Cigarettes  . Smokeless tobacco: Never Used  . 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  Physical Exam Updated Vital Signs BP 122/86 (BP Location: Left Arm)   Pulse 62   Temp 97.9 F (36.6 C) (Oral)   Resp 16   Ht 5\' 9"  (1.753 m)   Wt 200 lb (90.7 kg)   SpO2 99%   BMI 29.53 kg/m   Vital signs normal    Physical Exam  Constitutional: He is oriented to person, place, and time. He appears well-developed and well-nourished.  Non-toxic appearance. He does not appear ill. No distress.  HENT:  Head: Normocephalic and atraumatic.  Right Ear: External ear normal.  Left Ear: External ear normal.  Nose: Nose normal. No mucosal edema or rhinorrhea.  Mouth/Throat: Oropharynx is clear and moist and mucous membranes are normal. No dental abscesses or uvula swelling.  There is no blood seen in his  left nostril, there is a small amount of dark blood in his right nostril. There is no obvious lesions seen on his nasal septum. There is no active bleeding at this time.  Eyes: Conjunctivae and EOM are normal. Pupils are equal, round, and reactive to light.  Neck: Normal range of motion and full passive range of motion without pain. Neck supple.  Cardiovascular: Normal rate, regular rhythm and normal heart sounds.  Exam reveals no gallop and no friction rub.   No murmur heard. Pulmonary/Chest: Effort normal and breath sounds normal. No respiratory distress. He has no wheezes. He has no rhonchi. He has no rales. He exhibits no tenderness and no crepitus.  Abdominal: Soft. Normal appearance and bowel sounds are normal. He exhibits no distension. There is no tenderness. There is no rebound and no guarding.  Musculoskeletal: Normal range of motion. He exhibits no edema or tenderness.  Moves all extremities well.   Neurological: He is alert and oriented to person, place, and time. He has normal strength. No cranial nerve deficit.  Skin: Skin is warm, dry and intact. No rash noted. No erythema. No pallor.  Psychiatric: He has a normal mood and affect. His speech is normal and behavior is normal. His mood appears not anxious.  Nursing note and vitals reviewed.    ED Treatments / Results   Procedures Procedures (including critical care time)  Medications Ordered in ED Medications  oxymetazoline (AFRIN) 0.05 % nasal spray 1 spray ( Each Nare Given 11/18/16 0331)     Initial Impression / Assessment and Plan / ED Course  I have reviewed the triage vital signs and the nursing notes.  Pertinent labs & imaging results that were available during my care of the patient were reviewed by me and considered in my medical decision making (see chart for details).  Clinical Course    Afrin was sprayed in patient's nostrils. He had no further bleeding during his ED visit. Prior to discharge he walked  around the emergency department without difficulty and he did not have any recurrence of bleeding. We discussed he should use the Afrin nasal spray if it bleeds again and then hold pressure on his nostrils for 5 minutes to try to stop the bleeding. If that does not stop the bleeding he should return to the ED.  Final Clinical Impressions(s) / ED Diagnoses   Final diagnoses:  Right-sided epistaxis    Plan discharge  Devoria AlbeIva Abbygayle Helfand, MD, Concha PyoFACEP     Lawson Isabell, MD 11/18/16 [phone]

## 2016-11-18 NOTE — ED Triage Notes (Signed)
Pt c/o nose bleed that started x 30 mins pta; no active bleeding at this time; pt is also c/o right hand pain

## 2016-11-18 NOTE — Discharge Instructions (Signed)
Use the nasal spray if your nose starts to bleed again. Then hold pressure on the sides of your nostrils for 5 minutes to help stop the bleeding.  Return to the ED if it doesn't stop bleeding when you do that.  Try not to blow your nose or sneeze or it could start bleeding again.

## 2016-12-02 ENCOUNTER — Emergency Department (HOSPITAL_COMMUNITY)
Admission: EM | Admit: 2016-12-02 | Discharge: 2016-12-02 | Disposition: A | Discharge status: Home / Self Care | Payer: Medicaid Other | Attending: Emergency Medicine | Admitting: Emergency Medicine

## 2016-12-02 ENCOUNTER — Encounter (HOSPITAL_COMMUNITY): Payer: Self-pay | Admitting: Emergency Medicine

## 2016-12-02 DIAGNOSIS — Z79899 Other long term (current) drug therapy: Secondary | ICD-10-CM | POA: Diagnosis not present

## 2016-12-02 DIAGNOSIS — I10 Essential (primary) hypertension: Secondary | ICD-10-CM | POA: Diagnosis not present

## 2016-12-02 DIAGNOSIS — R51 Headache: Secondary | ICD-10-CM | POA: Insufficient documentation

## 2016-12-02 DIAGNOSIS — F17210 Nicotine dependence, cigarettes, uncomplicated: Secondary | ICD-10-CM | POA: Diagnosis not present

## 2016-12-02 DIAGNOSIS — R040 Epistaxis: Secondary | ICD-10-CM | POA: Diagnosis not present

## 2016-12-02 NOTE — Discharge Instructions (Signed)
You have dry blood in your right nostril, but no evidence of any active bleeding.   It is okay to use Afrin as a one time treatment as needed for bloody noses.  Bloody noses are more common in the winter when the air is dry and we use a heater. Try to keep your nostrils moistened with something like the Vaseline spray. You can use a humidifier at night.  Try to decrease the amount of Goody's powders you are taking, this is not good for you.  Bloody noses are not a sign of a rupture aneurysm.   If you note worsening weakness, numbness, or tingling on the right side of your body that is different from what you have leftover from your previous stroke, please seek care. Additionally, if you note headaches that are out of the ordinary for you, please seek care.

## 2016-12-02 NOTE — ED Provider Notes (Signed)
[facility] Provider Note   CSN: [id number] Arrival date & time: 12/02/16  1624     History   Chief Complaint Chief Complaint  Patient presents with  . Epistaxis    HPI Gary Harrington is a 55 y.o. male.  HPI  This is a 55 y/o male with a PMHx of chronic pain, anxiety, and stroke presenting with cocnerns for a bloody nose. He notes that approximately 1 hour ago, he was bending down to pick up wood when he noted bleeding of the right nostril. He put toilet paper up the nostril and it eventually stopped after 25-30 minutes.   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 He states that since his nose bleed last month, he has avoiding blowing his nose to prevent nose bleeds but is worried as it just started bleeding spontaneously. He notes he'd been scared to go to sleep, b/c he may have a bloody nose in his sleep. He denies any coughing/sneezing.   He did not use the Afrin that was given to him at his last ED visit for epistaxis as he didn't like the way it made him feel.    He also notes a headache on the right side for the last 2 days. He takes Goody Powders- 2-6 packs per day depending on how bad his headaches are. He denies any unilateral weakness, numbness, or tingling.    Past Medical History:  Diagnosis Date  . Chronic pain   . Dizziness   . Headache   . Hypertension   . Stroke [facility](HCC)     There are no active problems to display for this patient.   Past Surgical History:  Procedure Laterality Date  . TONSILLECTOMY         Home Medications    Prior to Admission medications   Medication Sig Start Date End Date Taking? Authorizing Provider  albuterol (PROVENTIL HFA;VENTOLIN HFA) 108 (90 Base) MCG/ACT inhaler Inhale 2 puffs into the lungs every 4 (four) hours as needed for wheezing or shortness of breath (or coughing). 05/05/16  Yes Dione Boozeavid Glick, MD  ALPRAZolam Prudy Feeler(XANAX) 1 MG tablet Take  1 tablet (1 mg total) by mouth 2 (two) times daily as needed for anxiety. 04/22/16  Yes Tammy Triplett, PA-C  Aspirin-Acetaminophen-Caffeine (GOODY HEADACHE PO) Take 1 packet by mouth every 4 (four) hours.   Yes Historical Provider, MD    Family History Family History  Problem Relation Age of Onset  . Stroke Father   . Heart failure Father     Social History Social History  Substance Use Topics  . Smoking status: Current Every Day Smoker    Packs/day: 0.50    Years: 27.00    Types: Cigarettes  . Smokeless tobacco: Never Used  . Alcohol use No     Allergies   Codeine; Penicillins; and Prednisone   Review of Systems Review of Systems  Constitutional: Negative for activity change, appetite change, chills, fatigue and fever.  HENT: Positive for nosebleeds. Negative for congestion, dental problem, drooling, ear pain, mouth sores, postnasal drip, rhinorrhea, sinus pain, sinus pressure, sneezing, sore throat and trouble swallowing.   Eyes: Negative for visual disturbance.  Respiratory: Negative for cough, choking, chest tightness, shortness of breath, wheezing and stridor.   Cardiovascular: Negative for chest pain, palpitations and leg swelling.  Gastrointestinal: Negative for abdominal distention and abdominal pain.  Endocrine: Negative.   Genitourinary: Negative.  Musculoskeletal: Negative for arthralgias, back pain, gait problem and joint swelling.  Skin: Negative for rash and wound.  Allergic/Immunologic: Negative.   Neurological: Positive for headaches. Negative for dizziness, tremors, syncope, facial asymmetry, weakness, light-headedness and numbness.  Hematological: Negative.   Psychiatric/Behavioral: The patient is nervous/anxious.      Physical Exam Updated Vital Signs BP 126/77 (BP Location: Left Arm)   Pulse 77   Temp 98.7 F (37.1 C) (Temporal)   Resp 18   Ht 5\' 9"  (1.753 m)   Wt 90.7 kg   SpO2 97%   BMI 29.53 kg/m   Physical Exam  Constitutional: He is  oriented to person, place, and time. He appears well-developed and well-nourished. No distress.  Anxious appearing  HENT:  Head: Normocephalic and atraumatic.  Right Ear: External ear normal.  Left Ear: External ear normal.  Mouth/Throat: No oropharyngeal exudate.  Dry dark red blood crusted in the right nostril. No lesions noted. No blood noted in the left nostril  Eyes: Conjunctivae are normal. Pupils are equal, round, and reactive to light. Right eye exhibits no discharge. Left eye exhibits no discharge. No scleral icterus.  Neck: Normal range of motion. Neck supple.  Cardiovascular: Normal rate, regular rhythm and normal heart sounds.  Exam reveals no gallop and no friction rub.   No murmur heard. Pulmonary/Chest: Effort normal and breath sounds normal. No respiratory distress. He has no wheezes. He has no rales.  Abdominal: Soft. Bowel sounds are normal. He exhibits no distension. There is no tenderness.  Musculoskeletal: He exhibits no edema or tenderness.  Lymphadenopathy:    He has no cervical adenopathy.  Neurological: He is alert and oriented to person, place, and time. He displays normal reflexes. He exhibits normal muscle tone.  4+/5 strength in the RUE and RLE, 5/5 on the left (pt reports this is baseline since his stroke).  Skin: Skin is warm. Capillary refill takes less than 2 seconds. No rash noted. He is not diaphoretic.  Psychiatric: He has a normal mood and affect. His behavior is normal.     ED Treatments / Results  Labs (all labs ordered are listed, but only abnormal results are displayed) Labs Reviewed - No data to display  EKG  EKG Interpretation None       Radiology No results found.  Procedures Procedures (including critical care time)  Medications Ordered in ED Medications - No data to display   Initial Impression / Assessment and Plan / ED Course  I have reviewed the triage vital signs and the nursing notes.  Pertinent labs & imaging results  that were available during my care of the patient were reviewed by me and considered in my medical decision making (see chart for details).  Clinical Course    No active bleeding on exam. Patient hemodynamically stable. Exam with decreased strength on the R compared to the left, this is stable per the patient from his previous stroke. He denies any new neurologic symptoms. We discussed that epistaxis is not a symptom of an aneurysm. Discussed avoiding foreign objects in the nose, forceful blowing, and keeping the nares moist (with things like vaseline mist and humidifiers at night). Discussed using Afrin and pressure as needed for epistaxis abortive therapy. Discussed return precautions.   Final Clinical Impressions(s) / ED Diagnoses   Final diagnoses:  Epistaxis    New Prescriptions New Prescriptions   No medications on file     Joanna Puff, MD 12/02/16 1815    Lavera Guise,  MD 12/03/16 1528

## 2016-12-02 NOTE — ED Triage Notes (Signed)
Pt reports nose bleed that started today. Pt reports he was recently seen here for the same. Pt states he got it stopped approx 30 min PTA.

## 2016-12-02 NOTE — ED Provider Notes (Signed)
I saw and evaluated the patient, reviewed the resident's note and I agree with the findings and plan.   EKG Interpretation None      55 year old male who presents with epistaxis. 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 On ASA and take goody powders. Onset of epistaxis tonight while bending over. Ongoing for 30 minutes, stuff his right nares with toilet paper and bleeding stopped. No bleeding in ED. Well appearing, with normal vital signs. Baseline neuro exam. Discussed supportive care for if this occurs again. Strict return and follow-up instructions reviewed. He expressed understanding of all discharge instructions and felt comfortable with the plan of care.    Gary Guiseana Duo Sylas Twombly, MD 12/02/16 [phone]

## 2016-12-13 ENCOUNTER — Emergency Department (HOSPITAL_COMMUNITY): Payer: Medicaid Other

## 2016-12-13 ENCOUNTER — Emergency Department (HOSPITAL_COMMUNITY)
Admission: EM | Admit: 2016-12-13 | Discharge: 2016-12-13 | Disposition: A | Discharge status: Home / Self Care | Payer: Medicaid Other | Attending: Emergency Medicine | Admitting: Emergency Medicine

## 2016-12-13 ENCOUNTER — Encounter (HOSPITAL_COMMUNITY): Payer: Self-pay | Admitting: *Deleted

## 2016-12-13 DIAGNOSIS — S9032XA Contusion of left foot, initial encounter: Secondary | ICD-10-CM | POA: Diagnosis not present

## 2016-12-13 DIAGNOSIS — Y929 Unspecified place or not applicable: Secondary | ICD-10-CM | POA: Insufficient documentation

## 2016-12-13 DIAGNOSIS — Y999 Unspecified external cause status: Secondary | ICD-10-CM | POA: Diagnosis not present

## 2016-12-13 DIAGNOSIS — Z7982 Long term (current) use of aspirin: Secondary | ICD-10-CM | POA: Insufficient documentation

## 2016-12-13 DIAGNOSIS — F17210 Nicotine dependence, cigarettes, uncomplicated: Secondary | ICD-10-CM | POA: Diagnosis not present

## 2016-12-13 DIAGNOSIS — Y939 Activity, unspecified: Secondary | ICD-10-CM | POA: Diagnosis not present

## 2016-12-13 DIAGNOSIS — M79671 Pain in right foot: Secondary | ICD-10-CM | POA: Diagnosis present

## 2016-12-13 DIAGNOSIS — I10 Essential (primary) hypertension: Secondary | ICD-10-CM | POA: Insufficient documentation

## 2016-12-13 MED ORDER — NAPROXEN 500 MG PO TABS: 500.0000 mg | ORAL_TABLET | Freq: Two times a day (BID) | ORAL | 0 refills | Status: DC

## 2016-12-13 MED ORDER — IBUPROFEN 800 MG PO TABS
800.0000 mg | ORAL_TABLET | Freq: Once | ORAL | Status: AC
  Administered 2016-12-13: 800 mg via ORAL
  Filled 2016-12-13: qty 1

## 2016-12-13 NOTE — ED Notes (Signed)
Pt alert & oriented x4, stable gait. Patient given discharge instructions, paperwork & prescription(s). Patient  instructed to stop at the registration desk to finish any additional paperwork. Patient verbalized understanding. Pt left department w/ no further questions. 

## 2016-12-13 NOTE — Discharge Instructions (Signed)
Elevate and apply ice packs on/off to your foot.  You can use the crutches as needed for weight bearing.  Call Dr. Mort SawyersHarrison's office in one week if not improving.

## 2016-12-13 NOTE — ED Provider Notes (Signed)
[facility] DE[facility] Provider Note   CSN: [id number] Arrival date & time: 12/13/16  1911     History   Chief Complaint Chief Complaint  Patient presents with  . Foot Pain    HPI Gary HearingSamuel T Cundari is a 55 y.o. male.  HPI  Gary HearingSamuel T Harrington is a 55 y.o. male who presents to the Emergency Department complaining of sudden onset of left foot pain three hours prior to arrival.  He states that the tailgate of his pick up truck fell off, landing on his left foot.  He complains of throbbing pain across the top of his foot.  He continued to work after the injury, but reports pain began to increase after removing his boot.  He denies numbness of the toes, swelling, open wounds or pain proximal to the foot.     Past Medical History:  Diagnosis Date  . Chronic pain   . Dizziness   . Headache   . Hypertension   . Stroke [facility](HCC)     There are no active problems to display for this patient.   Past Surgical History:  Procedure Laterality Date  . TONSILLECTOMY         Home Medications    Prior to Admission medications   Medication Sig Start Date End Date Taking? Authorizing Provider  albuterol (PROVENTIL HFA;VENTOLIN HFA) 108 (90 Base) MCG/ACT inhaler Inhale 2 puffs into the lungs every 4 (four) hours as needed for wheezing or shortness of breath (or coughing). 05/05/16   Dione Boozeavid Glick, MD  ALPRAZolam Prudy Feeler(XANAX) 1 MG tablet Take 1 tablet (1 mg total) by mouth 2 (two) times daily as needed for anxiety. 04/22/16   Benita Boonstra, PA-C  Aspirin-Acetaminophen-Caffeine (GOODY HEADACHE PO) Take 1 packet by mouth every 4 (four) hours.    Historical Provider, MD    Family History Family History  Problem Relation Age of Onset  . Stroke Father   . Heart failure Father     Social History Social History  Substance Use Topics  . Smoking status: Current Every Day Smoker    Packs/day: 0.50    Years: 27.00    Types: Cigarettes  . Smokeless tobacco: Never Used  . Alcohol use No     Allergies    Codeine; Penicillins; and Prednisone   Review of Systems Review of Systems  Constitutional: Negative for chills and fever.  Gastrointestinal: Negative for vomiting.  Musculoskeletal: Positive for arthralgias (left foot). Negative for joint swelling.  Skin: Negative for color change and wound.  Neurological: Negative for weakness and numbness.  All other systems reviewed and are negative.    Physical Exam Updated Vital Signs BP 122/68 (BP Location: Left Arm)   Pulse 89   Temp 97.4 F (36.3 C) (Oral)   Resp 16   Ht 5\' 9"  (1.753 m)   Wt 90.7 kg   SpO2 99%   BMI 29.53 kg/m   Physical Exam  Constitutional: He is oriented to person, place, and time. He appears well-developed and well-nourished. No distress.  HENT:  Head: Normocephalic and atraumatic.  Cardiovascular: Normal rate, regular rhythm and intact distal pulses.   Pulmonary/Chest: Effort normal and breath sounds normal.  Musculoskeletal: He exhibits tenderness. He exhibits no edema or deformity.  ttp of dorsal left foot.  No edema, bruising, open wounds or bony deformity.   DP pulse is brisk,distal sensation intact.  No proximal tenderness.  Neurological: He is alert and oriented to person, place, and time. He exhibits normal muscle tone. Coordination normal.  Skin: Skin is warm and dry.  Nursing note and vitals reviewed.    ED Treatments / Results  Labs (all labs ordered are listed, but only abnormal results are displayed) Labs Reviewed - No data to display  EKG  EKG Interpretation None       Radiology Dg Foot Complete Left  Result Date: 12/13/2016 CLINICAL DATA:  Initial encounter for LEFT FOOT PAIN, Pt states his tailgate fell off and landed on the top of his left foot. HISTORY OF DIZZINESS, HTN, STROKE, CHRONIC PAIN EXAM: LEFT FOOT - COMPLETE 3+ VIEW COMPARISON:  11/12/2011 FINDINGS: Small Achilles and calcaneal spurs. No acute fracture or dislocation. No significant soft tissue swelling. IMPRESSION:  No acute osseous abnormality. Electronically Signed   By: Jeronimo GreavesKyle  Talbot M.D.   On: 12/13/2016 19:59    Procedures Procedures (including critical care time)  Medications Ordered in ED Medications - No data to display   Initial Impression / Assessment and Plan / ED Course  I have reviewed the triage vital signs and the nursing notes.  Pertinent labs & imaging results that were available during my care of the patient were reviewed by me and considered in my medical decision making (see chart for details).  Clinical Course     Pt with nml appearing exam of the left foot.  XR neg for fx.  NV intact.  Likely contusion.  Pt agrees to RICE therapy.  rx for NSAID  Post op shoe applied, crutches given at pt request.    Final Clinical Impressions(s) / ED Diagnoses   Final diagnoses:  Contusion of left foot, initial encounter    New Prescriptions New Prescriptions   No medications on file     Pauline Ausammy Sheretta Grumbine, Cordelia Poche-C 12/13/16 2020    Maia PlanJoshua G Long, MD 12/13/16 [phone]

## 2016-12-13 NOTE — ED Triage Notes (Signed)
Pt states his tailgate fell off and landed on the top of his left foot.

## 2016-12-13 NOTE — ED Notes (Addendum)
Pt states pain to the top of the left foot after the tailgate of his truck landed on it. No deformity or swelling noted. Pt has good sensation & cap refill.

## 2017-01-29 ENCOUNTER — Emergency Department (HOSPITAL_COMMUNITY)
Admission: EM | Admit: 2017-01-29 | Discharge: 2017-01-29 | Disposition: A | Discharge status: Home / Self Care | Payer: Medicaid Other | Attending: Emergency Medicine | Admitting: Emergency Medicine

## 2017-01-29 ENCOUNTER — Encounter (HOSPITAL_COMMUNITY): Payer: Self-pay | Admitting: *Deleted

## 2017-01-29 DIAGNOSIS — I10 Essential (primary) hypertension: Secondary | ICD-10-CM | POA: Insufficient documentation

## 2017-01-29 DIAGNOSIS — Z79899 Other long term (current) drug therapy: Secondary | ICD-10-CM | POA: Diagnosis not present

## 2017-01-29 DIAGNOSIS — L235 Allergic contact dermatitis due to other chemical products: Secondary | ICD-10-CM | POA: Insufficient documentation

## 2017-01-29 DIAGNOSIS — R21 Rash and other nonspecific skin eruption: Secondary | ICD-10-CM | POA: Diagnosis present

## 2017-01-29 DIAGNOSIS — F17210 Nicotine dependence, cigarettes, uncomplicated: Secondary | ICD-10-CM | POA: Diagnosis not present

## 2017-01-29 MED ORDER — DEXAMETHASONE 4 MG PO TABS
4.0000 mg | ORAL_TABLET | Freq: Once | ORAL | Status: AC
  Administered 2017-01-29: 4 mg via ORAL
  Filled 2017-01-29: qty 1

## 2017-01-29 NOTE — ED Notes (Signed)
ED Provider at bedside. 

## 2017-01-29 NOTE — ED Triage Notes (Signed)
Pt c/o rash and itching to bilateral arms x 1 week after using Gain laundry detergent. Pt reports he has used Cortisone cream and Benadryl with no relief.

## 2017-01-29 NOTE — ED Provider Notes (Signed)
[facility]-EM[facility]ENCY DEPT Provider Note   CSN: [id number] Arrival date & time: 01/29/17  [id number]     History   Chief Complaint Chief Complaint  Patient presents with  . Rash    HPI Gary Harrington is a 56 y.o. male.  HPI Patient presents with an itchy rash on both his arms. States had over the last 3 days to a week. States it started after using gain detergent. States he has used several different medicines to treat it none of them has helped. No fevers. States it is only on his arms. States his son has similar symptoms. No chest pain. No trouble breathing. States he has been scratching it. Past Medical History:  Diagnosis Date  . Chronic pain   . Dizziness   . Headache   . Hypertension   . Stroke [facility](HCC)     There are no active problems to display for this patient.   Past Surgical History:  Procedure Laterality Date  . TONSILLECTOMY         Home Medications    Prior to Admission medications   Medication Sig Start Date End Date Taking? Authorizing Provider  albuterol (PROVENTIL HFA;VENTOLIN HFA) 108 (90 Base) MCG/ACT inhaler Inhale 2 puffs into the lungs every 4 (four) hours as needed for wheezing or shortness of breath (or coughing). 05/05/16   Dione Boozeavid Glick, MD  ALPRAZolam Prudy Feeler(XANAX) 1 MG tablet Take 1 tablet (1 mg total) by mouth 2 (two) times daily as needed for anxiety. 04/22/16   Tammy Triplett, PA-C  Aspirin-Acetaminophen-Caffeine (GOODY HEADACHE PO) Take 1 packet by mouth every 4 (four) hours.    Historical Provider, MD  naproxen (NAPROSYN) 500 MG tablet Take 1 tablet (500 mg total) by mouth 2 (two) times daily with a meal. 12/13/16   Tammy Triplett, PA-C    Family History Family History  Problem Relation Age of Onset  . Stroke Father   . Heart failure Father     Social History Social History  Substance Use Topics  . Smoking status: Current Every Day Smoker    Packs/day: 0.50    Years: 27.00    Types: Cigarettes  . Smokeless tobacco: Never Used  . Alcohol use No       Allergies   Codeine; Penicillins; and Prednisone   Review of Systems Review of Systems  Constitutional: Negative for appetite change and fever.  Respiratory: Negative for shortness of breath.   Skin: Positive for rash and wound. Negative for pallor.  Hematological: Negative for adenopathy.  Psychiatric/Behavioral: Negative for confusion.     Physical Exam Updated Vital Signs BP 131/82   Pulse 71   Temp 98 F (36.7 C) (Oral)   Resp 18   Ht 5\' 9"  (1.753 m)   Wt 200 lb (90.7 kg)   SpO2 100%   BMI 29.53 kg/m   Physical Exam  Constitutional: He appears well-developed.  Pulmonary/Chest: He has no wheezes.  Skin: Skin is warm.  Excoriated and scabbed areas on bilateral upper extremities. Does not appear to have similar areas on the trunk or other areas of his body.     ED Treatments / Results  Labs (all labs ordered are listed, but only abnormal results are displayed) Labs Reviewed - No data to display  EKG  EKG Interpretation None       Radiology No results found.  Procedures Procedures (including critical care time)  Medications Ordered in ED Medications  dexamethasone (DECADRON) tablet 4 mg (4 mg Oral Given 01/29/17 0734)  Initial Impression / Assessment and Plan / ED Course  I have reviewed the triage vital signs and the nursing notes.  Pertinent labs & imaging results that were available during my care of the patient were reviewed by me and considered in my medical decision making (see chart for details).   patient with potentially contact dermatitis. Patient reports history of same. No relief with topical Benadryl or hydrocortisone. We'll give oral steroids. Patient is reportedly allergic to prednisone so we'll give second dose of Decadron orally here.  Final Clinical Impressions(s) / ED Diagnoses   Final diagnoses:  Allergic dermatitis due to other chemical product    New Prescriptions Discharge Medication List as of 01/29/2017  7:30 AM        Benjiman Core, MD 01/29/17 [phone]

## 2017-05-03 ENCOUNTER — Emergency Department (HOSPITAL_COMMUNITY)
Admission: EM | Admit: 2017-05-03 | Discharge: 2017-05-03 | Disposition: A | Discharge status: Home / Self Care | Payer: Medicaid Other | Attending: Emergency Medicine | Admitting: Emergency Medicine

## 2017-05-03 ENCOUNTER — Encounter (HOSPITAL_COMMUNITY): Payer: Self-pay | Admitting: *Deleted

## 2017-05-03 DIAGNOSIS — Z79899 Other long term (current) drug therapy: Secondary | ICD-10-CM | POA: Diagnosis not present

## 2017-05-03 DIAGNOSIS — G5601 Carpal tunnel syndrome, right upper limb: Secondary | ICD-10-CM

## 2017-05-03 DIAGNOSIS — I10 Essential (primary) hypertension: Secondary | ICD-10-CM | POA: Diagnosis not present

## 2017-05-03 DIAGNOSIS — F17210 Nicotine dependence, cigarettes, uncomplicated: Secondary | ICD-10-CM | POA: Insufficient documentation

## 2017-05-03 DIAGNOSIS — R200 Anesthesia of skin: Secondary | ICD-10-CM | POA: Diagnosis present

## 2017-05-03 MED ORDER — NAPROXEN 500 MG PO TABS: 500.0000 mg | ORAL_TABLET | Freq: Two times a day (BID) | ORAL | 0 refills | Status: DC

## 2017-05-03 NOTE — Discharge Instructions (Signed)
Wear the splint at all times except when washing, bathing or showering. Make sure to wear it when you sleep.

## 2017-05-03 NOTE — ED Triage Notes (Signed)
Pt states having circulation problems in his right hand for the past 2 weeks. Says hand is numb.

## 2017-05-03 NOTE — ED Provider Notes (Signed)
[facility] DEPT Provider Note   CSN: [id number] Arrival date & time: 05/03/17  [id number]     History   Chief Complaint Chief Complaint  Patient presents with  . Numbness    HPI Gary Harrington is a 56 y.o. male.  The history is provided by the patient.   He complains of waking up at night with his right hand numb. This has been going on for 2 weeks and has been stable during that time. There is some numbness during the daytime, but it is not as severe as at night. He does tell me that he does a lot of work with a [employer]. He denies any weakness. There is no neck pain. He has not done anything to treat it. He does have history of stroke, but has had complete recovery.  Past Medical History:  Diagnosis Date  . Chronic pain   . Dizziness   . Headache   . Hypertension   . Stroke [facility](HCC)     There are no active problems to display for this patient.   Past Surgical History:  Procedure Laterality Date  . TONSILLECTOMY         Home Medications    Prior to Admission medications   Medication Sig Start Date End Date Taking? Authorizing Provider  albuterol (PROVENTIL HFA;VENTOLIN HFA) 108 (90 Base) MCG/ACT inhaler Inhale 2 puffs into the lungs every 4 (four) hours as needed for wheezing or shortness of breath (or coughing). 05/05/16  Yes Dione BoozeGlick, Jarielys Girardot, MD  ALPRAZolam Prudy Feeler(XANAX) 1 MG tablet Take 1 tablet (1 mg total) by mouth 2 (two) times daily as needed for anxiety. 04/22/16  Yes Triplett, Tammy, PA-C  Aspirin-Acetaminophen-Caffeine (GOODY HEADACHE PO) Take 1 packet by mouth every 4 (four) hours.   Yes [provider]  naproxen (NAPROSYN) 500 MG tablet Take 1 tablet (500 mg total) by mouth 2 (two) times daily with a meal. 05/03/17   Dione BoozeGlick, Kiaira Pointer, MD    Family History Family History  Problem Relation Age of Onset  . Stroke Father   . Heart failure Father     Social History Social History  Substance Use Topics  . Smoking status: Current Every Day Smoker    Packs/day:  0.50    Years: 27.00    Types: Cigarettes  . Smokeless tobacco: Never Used  . Alcohol use No     Allergies   Codeine; Penicillins; and Prednisone   Review of Systems Review of Systems  All other systems reviewed and are negative.    Physical Exam Updated Vital Signs BP 132/78 (BP Location: Left Arm)   Pulse 64   Temp 97.6 F (36.4 C) (Oral)   Resp 20   Ht 5\' 9"  (1.753 m)   Wt 200 lb (90.7 kg)   SpO2 97%   BMI 29.53 kg/m   Physical Exam  Nursing note and vitals reviewed.  56 year old male, resting comfortably and in no acute distress. Vital signs are normal. Oxygen saturation is 97%, which is normal. Head is normocephalic and atraumatic. PERRLA, EOMI. Oropharynx is clear. Neck is nontender and supple without adenopathy or JVD. Back is nontender and there is no CVA tenderness. Lungs are clear without rales, wheezes, or rhonchi. Chest is nontender. Heart has regular rate and rhythm without murmur. Abdomen is soft, flat, nontender without masses or hepatosplenomegaly and peristalsis is normoactive. Extremities have no cyanosis or edema, full range of motion is present. Mild thenar muscle wasting noted on the right hand. Positive Tinel sign  on the right. Skin is warm and dry without rash. Neurologic: Mental status is normal, cranial nerves are intact. Strength is 5 over 5 in all tested muscle groups with exception of moderate weakness of right pincer grasp. He endorses decreased sensation over the entire right side of his body including face, arms, legs, trunk.  ED Treatments / Results   Procedures Procedures (including critical care time)  Medications Ordered in ED Medications - No data to display   Initial Impression / Assessment and Plan / ED Course  I have reviewed the triage vital signs and the nursing notes.  Right-sided carpal tunnel syndrome. He is placed in a cockup wrist splint, given prescription for naproxen, and referred to hand surgery for  follow-up.  Final Clinical Impressions(s) / ED Diagnoses   Final diagnoses:  Carpal tunnel syndrome of right wrist    New Prescriptions Current Discharge Medication List       Dione Booze, MD 05/03/17 [phone]

## 2017-07-23 ENCOUNTER — Encounter (HOSPITAL_COMMUNITY): Payer: Self-pay | Admitting: Emergency Medicine

## 2017-07-23 ENCOUNTER — Emergency Department (HOSPITAL_COMMUNITY): Payer: Medicaid Other

## 2017-07-23 ENCOUNTER — Emergency Department (HOSPITAL_COMMUNITY)
Admission: EM | Admit: 2017-07-23 | Discharge: 2017-07-23 | Disposition: A | Discharge status: Home / Self Care | Payer: Medicaid Other | Attending: Emergency Medicine | Admitting: Emergency Medicine

## 2017-07-23 DIAGNOSIS — T189XXA Foreign body of alimentary tract, part unspecified, initial encounter: Secondary | ICD-10-CM | POA: Insufficient documentation

## 2017-07-23 DIAGNOSIS — Y33XXXA Other specified events, undetermined intent, initial encounter: Secondary | ICD-10-CM | POA: Diagnosis not present

## 2017-07-23 DIAGNOSIS — I10 Essential (primary) hypertension: Secondary | ICD-10-CM | POA: Diagnosis not present

## 2017-07-23 DIAGNOSIS — Y9389 Activity, other specified: Secondary | ICD-10-CM | POA: Insufficient documentation

## 2017-07-23 DIAGNOSIS — Y998 Other external cause status: Secondary | ICD-10-CM | POA: Diagnosis not present

## 2017-07-23 DIAGNOSIS — Y929 Unspecified place or not applicable: Secondary | ICD-10-CM | POA: Diagnosis not present

## 2017-07-23 DIAGNOSIS — F17210 Nicotine dependence, cigarettes, uncomplicated: Secondary | ICD-10-CM | POA: Diagnosis not present

## 2017-07-23 DIAGNOSIS — Z79899 Other long term (current) drug therapy: Secondary | ICD-10-CM | POA: Insufficient documentation

## 2017-07-23 NOTE — ED Provider Notes (Signed)
[facility] DEPT Provider Note   CSN: [id number] Arrival date & time: 07/23/17  1750     History   Chief Complaint Chief Complaint  Patient presents with  . Swallowed Foreign Body    HPI Gary Harrington is a 56 y.o. male.  HPI Patient resents with possible ingested foreign body. States he has to take care of his family in [place]hes was started eating with plastic fork. States he was eating when he felt something go down his "windpipe". States it was not having any trouble breathing but it went down to his stomach. With further clarification it appears he is talking about his esophagus. He feels fine otherwise. Definitely no difficulty breathing. No chest or abdominal pain. He is without other complaints just worried. Past Medical History:  Diagnosis Date  . Chronic pain   . Dizziness   . Headache   . Hypertension   . Stroke [facility](HCC)     There are no active problems to display for this patient.   Past Surgical History:  Procedure Laterality Date  . TONSILLECTOMY         Home Medications    Prior to Admission medications   Medication Sig Start Date End Date Taking? Authorizing Provider  albuterol (PROVENTIL HFA;VENTOLIN HFA) 108 (90 Base) MCG/ACT inhaler Inhale 2 puffs into the lungs every 4 (four) hours as needed for wheezing or shortness of breath (or coughing). 05/05/16  Yes Dione BoozeGlick, David, MD  alprazolam Prudy Feeler(XANAX) 2 MG tablet Take 2 mg by mouth 2 (two) times daily.   Yes [provider]  Aspirin-Acetaminophen-Caffeine (GOODY HEADACHE PO) Take 1 packet by mouth every 4 (four) hours.   Yes [provider]    Family History Family History  Problem Relation Age of Onset  . Stroke Father   . Heart failure Father     Social History Social History  Substance Use Topics  . Smoking status: Current Every Day Smoker    Packs/day: 0.50    Years: 27.00    Types: Cigarettes  . Smokeless tobacco: Never Used  . Alcohol use No     Allergies   Codeine;  Penicillins; and Prednisone   Review of Systems Review of Systems  Constitutional: Negative for activity change and appetite change.  HENT: Negative for sore throat and trouble swallowing.   Eyes: Negative for pain.  Respiratory: Negative for chest tightness.   Cardiovascular: Negative for chest pain and leg swelling.  Gastrointestinal: Negative for abdominal pain, diarrhea, nausea and vomiting.  Genitourinary: Negative for flank pain.  Musculoskeletal: Negative for back pain.  Skin: Negative for rash.  Neurological: Negative for numbness.  Psychiatric/Behavioral: Negative for behavioral problems.     Physical Exam Updated Vital Signs BP 117/66 (BP Location: Left Arm)   Pulse 78   Temp 97.9 F (36.6 C) (Oral)   Resp 19   Ht 5\' 9"  (1.753 m)   Wt 99.8 kg (220 lb)   SpO2 97%   BMI 32.49 kg/m   Physical Exam  Constitutional: He appears well-developed.  HENT:  Head: Atraumatic.  Neck: Neck supple.  Cardiovascular: Normal rate.   Pulmonary/Chest: Effort normal.  Abdominal: Soft. There is no tenderness.  Musculoskeletal: He exhibits no edema.  Neurological: He is alert.  Skin: Skin is warm.  Psychiatric: He has a normal mood and affect.     ED Treatments / Results  Labs (all labs ordered are listed, but only abnormal results are displayed) Labs Reviewed - No data to display  EKG  EKG Interpretation None       Radiology Dg Chest 1 View  Result Date: 07/23/2017 CLINICAL DATA:  Ingestion of foreign body. EXAM: CHEST 1 VIEW COMPARISON:  May 05, 2016 FINDINGS: Healed left rib fractures are seen. No pneumothorax. No foreign body identified. The heart, hila, mediastinum, lungs, and pleura are otherwise normal. IMPRESSION: No active disease. Electronically Signed   By: Gerome Samavid  Williams III M.D   On: 07/23/2017 18:44   Dg Abdomen 1 View  Result Date: 07/23/2017 CLINICAL DATA:  Ingestion of foreign body. EXAM: ABDOMEN - 1 VIEW COMPARISON:  None. FINDINGS: No foreign body  identified. No free air, portal venous gas, or pneumatosis. No acute abnormalities. IMPRESSION: Negative. Electronically Signed   By: Gerome Samavid  Williams III M.D   On: 07/23/2017 18:45    Procedures Procedures (including critical care time)  Medications Ordered in ED Medications - No data to display   Initial Impression / Assessment and Plan / ED Course  I have reviewed the triage vital signs and the nursing notes.  Pertinent labs & imaging results that were available during my care of the patient were reviewed by me and considered in my medical decision making (see chart for details).     Patient with possible swallowing of the scope a plastic fork. Benign exam now. X-rays done and did not show foreign body, although low yield with a plastic piece. No complaints at this time. Discharge home.  Final Clinical Impressions(s) / ED Diagnoses   Final diagnoses:  Swallowed foreign body, initial encounter    New Prescriptions Discharge Medication List as of 07/23/2017  7:17 PM       Benjiman CorePickering, Yaris Ferrell, MD 07/23/17 1932

## 2017-07-23 NOTE — ED Notes (Signed)
Patient transported to X-ray 

## 2017-07-23 NOTE — ED Triage Notes (Signed)
Swallowed end of plastic fork by accident approx 30 min ago.  No airway obstruction

## 2017-08-01 ENCOUNTER — Emergency Department (HOSPITAL_COMMUNITY): Payer: Medicaid Other

## 2017-08-01 ENCOUNTER — Emergency Department (HOSPITAL_COMMUNITY)
Admission: EM | Admit: 2017-08-01 | Discharge: 2017-08-01 | Disposition: A | Discharge status: Home / Self Care | Payer: Medicaid Other | Attending: Emergency Medicine | Admitting: Emergency Medicine

## 2017-08-01 ENCOUNTER — Encounter (HOSPITAL_COMMUNITY): Payer: Self-pay | Admitting: Emergency Medicine

## 2017-08-01 DIAGNOSIS — M79605 Pain in left leg: Secondary | ICD-10-CM | POA: Insufficient documentation

## 2017-08-01 DIAGNOSIS — F17210 Nicotine dependence, cigarettes, uncomplicated: Secondary | ICD-10-CM | POA: Insufficient documentation

## 2017-08-01 DIAGNOSIS — Z79899 Other long term (current) drug therapy: Secondary | ICD-10-CM | POA: Diagnosis not present

## 2017-08-01 DIAGNOSIS — I10 Essential (primary) hypertension: Secondary | ICD-10-CM | POA: Diagnosis not present

## 2017-08-01 MED ORDER — NAPROXEN 500 MG PO TABS: 500.0000 mg | ORAL_TABLET | Freq: Two times a day (BID) | ORAL | 0 refills | Status: DC

## 2017-08-01 MED ORDER — NAPROXEN 250 MG PO TABS
500.0000 mg | ORAL_TABLET | Freq: Once | ORAL | Status: AC
  Administered 2017-08-01: 500 mg via ORAL
  Filled 2017-08-01: qty 2

## 2017-08-01 NOTE — ED Triage Notes (Signed)
Pain to lower LT leg x 2 days.  Denies any injury.  No redness or swelling

## 2017-08-01 NOTE — Discharge Instructions (Signed)
Your xrays and exam are reassuring that you have no emergent problems tonight associated with your leg pain.  Take the medicine prescribed.  Apply a heating pad to your leg for 20 minutes several times daily.  Follow up with your doctor if your symptoms are not improving.  Your symptoms are not suggestive of a blood clot which I know was your concern tonight.

## 2017-08-01 NOTE — ED Notes (Signed)
Pt ambulated to radiology and from radiology heel to toe with even gait

## 2017-08-03 ENCOUNTER — Encounter (HOSPITAL_COMMUNITY): Payer: Self-pay | Admitting: *Deleted

## 2017-08-03 ENCOUNTER — Emergency Department (HOSPITAL_COMMUNITY): Payer: Medicaid Other

## 2017-08-03 ENCOUNTER — Emergency Department (HOSPITAL_COMMUNITY)
Admission: EM | Admit: 2017-08-03 | Discharge: 2017-08-03 | Disposition: A | Discharge status: Home / Self Care | Payer: Medicaid Other | Attending: Emergency Medicine | Admitting: Emergency Medicine

## 2017-08-03 DIAGNOSIS — I10 Essential (primary) hypertension: Secondary | ICD-10-CM | POA: Insufficient documentation

## 2017-08-03 DIAGNOSIS — F17210 Nicotine dependence, cigarettes, uncomplicated: Secondary | ICD-10-CM | POA: Diagnosis not present

## 2017-08-03 DIAGNOSIS — Z8673 Personal history of transient ischemic attack (TIA), and cerebral infarction without residual deficits: Secondary | ICD-10-CM | POA: Insufficient documentation

## 2017-08-03 DIAGNOSIS — M79662 Pain in left lower leg: Secondary | ICD-10-CM | POA: Diagnosis present

## 2017-08-03 MED ORDER — ACETAMINOPHEN 325 MG PO TABS
650.0000 mg | ORAL_TABLET | Freq: Once | ORAL | Status: AC
  Administered 2017-08-03: 650 mg via ORAL
  Filled 2017-08-03: qty 2

## 2017-08-03 NOTE — ED Provider Notes (Signed)
[facility]-EM[facility]ENCY DEPT Provider Note   CSN: [id number] Arrival date & time: 08/01/17  1954     History   Chief Complaint Chief Complaint  Patient presents with  . Leg Pain    HPI Gary Harrington is a 56 y.o. male presenting with left lower anterior leg pain starting 2 days ago which is constant, feels like a toothache and starts at his anterior lower knee and radiates to his ankle.  He denies injury but endorses problems with chronic intermittent left knee pain.  He was told it could be a blood clot and is very concerned about this possibility. He denies redness, swelling, calf pain and denies history of dvt.  He has had no treatments prior to arrival and has found no alleviators.  The history is provided by the patient.    Past Medical History:  Diagnosis Date  . Chronic pain   . Dizziness   . Headache   . Hypertension   . Stroke [facility])     There are no active problems to display for this patient.   Past Surgical History:  Procedure Laterality Date  . TONSILLECTOMY         Home Medications    Prior to Admission medications   Medication Sig Start Date End Date Taking? Authorizing Provider  albuterol (PROVENTIL HFA;VENTOLIN HFA) 108 (90 Base) MCG/ACT inhaler Inhale 2 puffs into the lungs every 4 (four) hours as needed for wheezing or shortness of breath (or coughing). 05/05/16   Dione Booze, MD  alprazolam Prudy Feeler) 2 MG tablet Take 2 mg by mouth 2 (two) times daily.    [provider]  Aspirin-Acetaminophen-Caffeine (GOODY HEADACHE PO) Take 1 packet by mouth every 4 (four) hours.    [provider]  naproxen (NAPROSYN) 500 MG tablet Take 1 tablet (500 mg total) by mouth 2 (two) times daily. 08/01/17   Burgess Amor, PA-C    Family History Family History  Problem Relation Age of Onset  . Stroke Father   . Heart failure Father     Social History Social History  Substance Use Topics  . Smoking status: Current Every Day Smoker    Packs/day: 0.50   Years: 27.00    Types: Cigarettes  . Smokeless tobacco: Never Used  . Alcohol use No     Allergies   Codeine; Penicillins; and Prednisone   Review of Systems Review of Systems  Constitutional: Negative for fever.  HENT: Negative.   Respiratory: Negative for shortness of breath.   Cardiovascular: Negative for leg swelling.  Gastrointestinal: Negative.   Musculoskeletal: Positive for arthralgias. Negative for joint swelling and myalgias.  Skin: Negative for color change.  Neurological: Negative for weakness and numbness.     Physical Exam Updated Vital Signs BP 121/67 (BP Location: Right Arm)   Pulse 80   Temp 98.2 F (36.8 C) (Oral)   Resp 19   Ht 5\' 9"  (1.753 m)   Wt 99.8 kg (220 lb)   SpO2 98%   BMI 32.49 kg/m   Physical Exam  Constitutional: He appears well-developed and well-nourished.  HENT:  Head: Atraumatic.  Neck: Normal range of motion.  Cardiovascular:  Pulses:      Dorsalis pedis pulses are 2+ on the right side, and 2+ on the left side.  Pulses equal bilaterally  Musculoskeletal: He exhibits tenderness.       Left knee: He exhibits normal range of motion, no swelling, no effusion, normal alignment, no LCL laxity and no MCL laxity. No tenderness  found.       Legs: ttp along left anterior tibia. No edema or erythema. No calf tenderness, induration, no palpable cords, negative Homan's sign.    Neurological: He is alert. He has normal strength. He displays normal reflexes. No sensory deficit.  Skin: Skin is warm and dry.  Several healing scratches/abrasions and 2 small scabs left anterior ankle, well healing without signs of infection (pt endorses chigger bites and scratches from walking in the woods). Red nail polish noted as home tx of chiggers.  No edema, no erythema.   Psychiatric: He has a normal mood and affect.     ED Treatments / Results  Labs (all labs ordered are listed, but only abnormal results are displayed) Labs Reviewed - No data to  display  EKG  EKG Interpretation None       Radiology Dg Tibia/fibula Left  Result Date: 08/01/2017 CLINICAL DATA:  Initial evaluation for left lower leg pain for 2 days. EXAM: LEFT TIBIA AND FIBULA - 2 VIEW COMPARISON:  None. FINDINGS: There is no evidence of fracture or other focal bone lesions. Soft tissues demonstrate no acute abnormality. Retained metallic BB overlies the popliteal fossa. IMPRESSION: 1. No acute abnormality about the left tibia/fibula. 2. Subcentimeter retained metallic foreign body within the soft tissues posterior to the left knee. Electronically Signed   By: Rise MuBenjamin  McClintock M.D.   On: 08/01/2017 21:10    Procedures Procedures (including critical care time)  Medications Ordered in ED Medications  naproxen (NAPROSYN) tablet 500 mg (500 mg Oral Given 08/01/17 2118)     Initial Impression / Assessment and Plan / ED Course  I have reviewed the triage vital signs and the nursing notes.  Pertinent labs & imaging results that were available during my care of the patient were reviewed by me and considered in my medical decision making (see chart for details).     Pt with hx and exam reassuring, no exam findings or risk factors suggesting dvt.  Pt given reassurance, advised elevation, discussed heat tx, naproxen. Plan f/u with pcp if sx persist.  Final Clinical Impressions(s) / ED Diagnoses   Final diagnoses:  Left leg pain    New Prescriptions Discharge Medication List as of 08/01/2017  8:59 PM    START taking these medications   Details  naproxen (NAPROSYN) 500 MG tablet Take 1 tablet (500 mg total) by mouth 2 (two) times daily., Starting Sat 08/01/2017, Print         Burgess Amordol, Lanecia Sliva, Cordelia Poche-C 08/03/17 1226    Bethann BerkshireZammit, Joseph, MD 08/03/17 1334

## 2017-08-03 NOTE — Discharge Instructions (Signed)

## 2017-08-03 NOTE — ED Triage Notes (Signed)
Pt c/o left leg pain for the last couple of days; pt states he is having swelling and the meds he received yesterday did not help with his pain

## 2017-08-03 NOTE — ED Provider Notes (Signed)
Blood pressure 132/79, pulse 61, temperature 97.6 F (36.4 C), temperature source Oral, resp. rate 18, height 5\' 9"  (1.753 m), weight 99.8 kg (220 lb), SpO2 99 %.  Assuming care from Dr. Bebe ShaggyWickline.  In short, Gary Harrington is a 56 y.o. male with a chief complaint of Leg Pain .  Refer to the original H&P for additional details.  The current plan of care is to follow DVT US.  08:31 AM US negative for DVT. Plan for discharge.   At this time, I do not feel there is any life-threatening condition present. I have reviewed and discussed all results (EKG, imaging, lab, urine as appropriate), exam findings with patient. I have reviewed nursing notes and appropriate previous records.  I feel the patient is safe to be discharged home without further emergent workup. Discussed usual and customary return precautions. Patient and family (if present) verbalize understanding and are comfortable with this plan.  Patient will follow-up with their primary care provider. If they do not have a primary care provider, information for follow-up has been provided to them. All questions have been answered.  Alona BeneJoshua Long, MD    Maia PlanLong, Joshua G, MD 08/03/17 [phone]
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
## 2017-08-15 ENCOUNTER — Emergency Department (HOSPITAL_COMMUNITY)
Admission: EM | Admit: 2017-08-15 | Discharge: 2017-08-15 | Disposition: A | Discharge status: Home / Self Care | Payer: Medicaid Other | Attending: Emergency Medicine | Admitting: Emergency Medicine

## 2017-08-15 ENCOUNTER — Encounter (HOSPITAL_COMMUNITY): Payer: Self-pay | Admitting: Emergency Medicine

## 2017-08-15 DIAGNOSIS — M79605 Pain in left leg: Secondary | ICD-10-CM | POA: Insufficient documentation

## 2017-08-15 DIAGNOSIS — I10 Essential (primary) hypertension: Secondary | ICD-10-CM | POA: Insufficient documentation

## 2017-08-15 DIAGNOSIS — Z79899 Other long term (current) drug therapy: Secondary | ICD-10-CM | POA: Insufficient documentation

## 2017-08-15 DIAGNOSIS — D649 Anemia, unspecified: Secondary | ICD-10-CM | POA: Insufficient documentation

## 2017-08-15 DIAGNOSIS — F17210 Nicotine dependence, cigarettes, uncomplicated: Secondary | ICD-10-CM | POA: Insufficient documentation

## 2017-08-15 LAB — COMPREHENSIVE METABOLIC PANEL
ALBUMIN: 4.3 g/dL (ref 3.5–5.0)
ALK PHOS: 69 U/L (ref 38–126)
ALT: 17 U/L (ref 17–63)
AST: 18 U/L (ref 15–41)
Anion gap: 9 (ref 5–15)
BUN: 15 mg/dL (ref 6–20)
CO2: 25 mmol/L (ref 22–32)
CREATININE: 0.9 mg/dL (ref 0.61–1.24)
Calcium: 9.5 mg/dL (ref 8.9–10.3)
Chloride: 105 mmol/L (ref 101–111)
GFR calc Af Amer: >60 mL/min (ref >60)
GFR calc non Af Amer: >60 mL/min (ref >60)
Glucose, Bld: 88 mg/dL (ref 65–99)
Potassium: 4.1 mmol/L (ref 3.5–5.1)
SODIUM: 139 mmol/L (ref 135–145)
Total Bilirubin: 0.6 mg/dL (ref 0.3–1.2)
Total Protein: 7.1 g/dL (ref 6.5–8.1)

## 2017-08-15 LAB — CBC WITH DIFFERENTIAL/PLATELET
BASOS PCT: 0 %
Basophils Absolute: 0 10*3/uL (ref 0.0–0.1)
EOS ABS: 0.3 10*3/uL (ref 0.0–0.7)
Eosinophils Relative: 3 %
HCT: 39.5 % (ref 39.0–52.0)
HEMOGLOBIN: 12.8 g/dL — AB (ref 13.0–17.0)
LYMPHS ABS: 1.5 10*3/uL (ref 0.7–4.0)
Lymphocytes Relative: 18 %
MCH: 21.3 pg — AB (ref 26.0–34.0)
MCHC: 32.4 g/dL (ref 30.0–36.0)
MCV: 65.8 fL — ABNORMAL LOW (ref 78.0–100.0)
Monocytes Absolute: 0.6 10*3/uL (ref 0.1–1.0)
Monocytes Relative: 7 %
Neutro Abs: 6.2 10*3/uL (ref 1.7–7.7)
Neutrophils Relative %: 72 %
Platelets: 240 10*3/uL (ref 150–400)
RBC: 6 MIL/uL — AB (ref 4.22–5.81)
RDW: 15.5 % (ref 11.5–15.5)
WBC: 8.6 10*3/uL (ref 4.0–10.5)

## 2017-08-15 MED ORDER — TRAMADOL HCL 50 MG PO TABS: 50.0000 mg | ORAL_TABLET | Freq: Four times a day (QID) | ORAL | 0 refills | Status: DC | PRN

## 2017-08-15 MED ORDER — HYDROCODONE-ACETAMINOPHEN 5-325 MG PO TABS
1.0000 | ORAL_TABLET | Freq: Once | ORAL | Status: AC
  Administered 2017-08-15: 1 via ORAL
  Filled 2017-08-15: qty 1

## 2017-08-15 NOTE — Discharge Instructions (Signed)
Follow-up with the [facility] or Dr. Karilyn Cota in 1-2 weeks

## 2017-08-15 NOTE — ED Provider Notes (Signed)
[facility]Y DEPT Provider Note   CSN: [id number] Arrival date & time: 08/15/17  0611     History   Chief Complaint Chief Complaint  Patient presents with  . Leg Pain    HPI Gary Harrington is a 56 y.o. male.  Patient complains of pain in his left lower extremity. He has been seen twice for this and has had plain films and an ultrasound of that leg which have not shown anything.   The history is provided by the patient.  Leg Pain   This is a recurrent problem. The current episode started more than 1 week ago. The problem occurs constantly. The problem has not changed since onset.The pain is present in the left lower leg. The quality of the pain is described as dull. The pain is at a severity of 4/10. The pain is moderate. Pertinent negatives include full range of motion. The symptoms are aggravated by activity. He has tried nothing for the symptoms. The treatment provided no relief.    Past Medical History:  Diagnosis Date  . Chronic pain   . Dizziness   . Headache   . Hypertension   . Stroke [facility])     There are no active problems to display for this patient.   Past Surgical History:  Procedure Laterality Date  . TONSILLECTOMY         Home Medications    Prior to Admission medications   Medication Sig Start Date End Date Taking? Authorizing Provider  albuterol (PROVENTIL HFA;VENTOLIN HFA) 108 (90 Base) MCG/ACT inhaler Inhale 2 puffs into the lungs every 4 (four) hours as needed for wheezing or shortness of breath (or coughing). 05/05/16  Yes Dione Booze, MD  alprazolam Prudy Feeler) 2 MG tablet Take 2 mg by mouth 2 (two) times daily.   Yes [provider]  Aspirin-Acetaminophen-Caffeine (GOODY HEADACHE PO) Take 1 packet by mouth every 4 (four) hours.   Yes [provider]  naproxen (NAPROSYN) 500 MG tablet Take 1 tablet (500 mg total) by mouth 2 (two) times daily. 08/01/17  Yes Idol, Raynelle Fanning, PA-C  traMADol (ULTRAM) 50 MG tablet Take 1 tablet (50 mg  total) by mouth every 6 (six) hours as needed. 08/15/17   Bethann Berkshire, MD    Family History Family History  Problem Relation Age of Onset  . Stroke Father   . Heart failure Father     Social History Social History  Substance Use Topics  . Smoking status: Current Every Day Smoker    Packs/day: 0.50    Years: 27.00    Types: Cigarettes  . Smokeless tobacco: Never Used  . Alcohol use No     Allergies   Codeine; Penicillins; and Prednisone   Review of Systems Review of Systems  Constitutional: Negative for appetite change and fatigue.  HENT: Negative for congestion, ear discharge and sinus pressure.   Eyes: Negative for discharge.  Respiratory: Negative for cough.   Cardiovascular: Negative for chest pain.  Gastrointestinal: Negative for abdominal pain and diarrhea.  Genitourinary: Negative for frequency and hematuria.  Musculoskeletal: Negative for back pain.       Left lower leg pain  Skin: Negative for rash.  Neurological: Negative for seizures and headaches.  Psychiatric/Behavioral: Negative for hallucinations.     Physical Exam Updated Vital Signs BP 127/78   Pulse 72   Temp 97.6 F (36.4 C) (Oral)   Resp 19   Ht 5\' 9"  (1.753 m)   Wt 99.8 kg (220 lb)   SpO2  100%   BMI 32.49 kg/m   Physical Exam  Constitutional: He is oriented to person, place, and time. He appears well-developed.  HENT:  Head: Normocephalic.  Eyes: Conjunctivae and EOM are normal. No scleral icterus.  Neck: Neck supple. No thyromegaly present.  Cardiovascular: Normal rate and regular rhythm.  Exam reveals no gallop and no friction rub.   No murmur heard. Pulmonary/Chest: No stridor. He has no wheezes. He has no rales. He exhibits no tenderness.  Abdominal: He exhibits no distension. There is no tenderness. There is no rebound.  Musculoskeletal: Normal range of motion. He exhibits no edema.  Mild calf tenderness in left lower leg  Lymphadenopathy:    He has no cervical  adenopathy.  Neurological: He is oriented to person, place, and time. He exhibits normal muscle tone. Coordination normal.  Skin: No rash noted. No erythema.  Psychiatric: He has a normal mood and affect. His behavior is normal.     ED Treatments / Results  Labs (all labs ordered are listed, but only abnormal results are displayed) Labs Reviewed  CBC WITH DIFFERENTIAL/PLATELET - Abnormal; Notable for the following:       Result Value   RBC 6.00 (*)    Hemoglobin 12.8 (*)    MCV 65.8 (*)    MCH 21.3 (*)    All other components within normal limits  COMPREHENSIVE METABOLIC PANEL    EKG  EKG Interpretation None       Radiology No results found.  Procedures Procedures (including critical care time)  Medications Ordered in ED Medications  HYDROcodone-acetaminophen (NORCO/VICODIN) 5-325 MG per tablet 1 tablet (1 tablet Oral Given 08/15/17 0831)     Initial Impression / Assessment and Plan / ED Course  I have reviewed the triage vital signs and the nursing notes.  Pertinent labs & imaging results that were available during my care of the patient were reviewed by me and considered in my medical decision making (see chart for details).     Labs show mild anemia. Patient will be discharged with Ultram and referred to a family physician for continued care  Final Clinical Impressions(s) / ED Diagnoses   Final diagnoses:  Left leg pain    New Prescriptions New Prescriptions   TRAMADOL (ULTRAM) 50 MG TABLET    Take 1 tablet (50 mg total) by mouth every 6 (six) hours as needed.     Bethann Berkshire, MD 08/15/17 1041

## 2017-08-15 NOTE — ED Triage Notes (Signed)
Pt with c/o L. Leg pain from knee down. States he has been seen here twice for the same but is no better.

## 2017-08-19 ENCOUNTER — Emergency Department (HOSPITAL_COMMUNITY)
Admission: EM | Admit: 2017-08-19 | Discharge: 2017-08-19 | Disposition: A | Discharge status: Home / Self Care | Payer: Medicaid Other | Attending: Emergency Medicine | Admitting: Emergency Medicine

## 2017-08-19 ENCOUNTER — Encounter (HOSPITAL_COMMUNITY): Payer: Self-pay | Admitting: Emergency Medicine

## 2017-08-19 DIAGNOSIS — M79672 Pain in left foot: Secondary | ICD-10-CM

## 2017-08-19 DIAGNOSIS — R2242 Localized swelling, mass and lump, left lower limb: Secondary | ICD-10-CM | POA: Insufficient documentation

## 2017-08-19 DIAGNOSIS — F17210 Nicotine dependence, cigarettes, uncomplicated: Secondary | ICD-10-CM | POA: Insufficient documentation

## 2017-08-19 DIAGNOSIS — I10 Essential (primary) hypertension: Secondary | ICD-10-CM | POA: Diagnosis not present

## 2017-08-19 DIAGNOSIS — M79605 Pain in left leg: Secondary | ICD-10-CM | POA: Diagnosis not present

## 2017-08-19 NOTE — Discharge Instructions (Signed)

## 2017-08-19 NOTE — ED Triage Notes (Signed)
Pt c/o swelling from knee to the foot on left leg. Pt has been seen multiple times for the same.

## 2017-08-19 NOTE — ED Provider Notes (Signed)
Emergency Department Provider Note   I have reviewed the triage vital signs and the nursing notes.   HISTORY  Chief Complaint Leg Pain   HPI Gary Harrington is a 56 y.o. male presents to the emergency department for evaluation of left lower extremity pain and swelling. He's been seen multiple times for this complaint but reports pain is worsening. He went to urgent care recently and they wrapped his leg in an Ace wrap. He notes "it was cutting into my meat" and so removed it at home. He continues to take Goody's powder daily. No additional pain medication. He has not followed up with a PCP as of yet. He denies any difficulty breathing or chest pain. No fevers or chills. No rash. He notes that when the Ace wrap was applied he had significant itching. No radiation of symptoms.    Past Medical History:  Diagnosis Date  . Chronic pain   . Dizziness   . Headache   . Hypertension   . Stroke [facility](HCC)     There are no active problems to display for this patient.   Past Surgical History:  Procedure Laterality Date  . TONSILLECTOMY      Current Outpatient Rx  . Order #: [id number] Class: Print  . Order #: [id number] Class: Historical Med  . Order #: [id number] Class: Historical Med  . Order #: [id number] Class: Print  . Order #: [id number] Class: Print    Allergies Codeine; Penicillins; and Prednisone  Family History  Problem Relation Age of Onset  . Stroke Father   . Heart failure Father     Social History Social History  Substance Use Topics  . Smoking status: Current Every Day Smoker    Packs/day: 0.50    Years: 27.00    Types: Cigarettes  . Smokeless tobacco: Never Used  . Alcohol use No    Review of Systems  Constitutional: No fever/chills Eyes: No visual changes. ENT: No sore throat. Cardiovascular: Denies chest pain. Respiratory: Denies shortness of breath. Gastrointestinal: No abdominal pain.  No nausea, no vomiting.  No diarrhea.  No  constipation. Genitourinary: Negative for dysuria. Musculoskeletal: Negative for back pain. Chronic left lower extremity pain and swelling.  Skin: Negative for rash. Neurological: Negative for headaches, focal weakness or numbness.  10-point ROS otherwise negative.  ____________________________________________   PHYSICAL EXAM:  VITAL SIGNS: ED Triage Vitals  Enc Vitals Group     BP 08/19/17 2005 (!) 129/95     Pulse Rate 08/19/17 2005 72     Resp 08/19/17 2005 18     Temp 08/19/17 2005 98 F (36.7 C)     Temp Source 08/19/17 2005 Oral     SpO2 08/19/17 2005 98 %     Weight 08/19/17 2006 220 lb (99.8 kg)     Height 08/19/17 2006 5\' 9"  (1.753 m)     Pain Score 08/19/17 2003 10   Constitutional: Alert and oriented. Well appearing and in no acute distress. Eyes: Conjunctivae are normal.  Head: Atraumatic. Nose: No congestion/rhinnorhea. Mouth/Throat: Mucous membranes are moist.  Oropharynx non-erythematous. Neck: No stridor.   Cardiovascular: Normal rate, regular rhythm. Good peripheral circulation. Grossly normal heart sounds.   Respiratory: Normal respiratory effort.  No retractions. Lungs CTAB. Gastrointestinal: Soft and nontender. No distention.  Musculoskeletal: No gross deformities of extremities. Mild LLE edema (trace) with diffuse tenderness to palpation worse over the dorsum of the left foot. No ulceration or lacerations.  Neurologic:  Normal speech and language. No gross focal neurologic deficits are  appreciated.  Skin:  Skin is warm, dry and intact. No rash noted.  ____________________________________________  RADIOLOGY  None ____________________________________________   PROCEDURES  Procedure(s) performed:   Procedures  None ____________________________________________   INITIAL IMPRESSION / ASSESSMENT AND PLAN / ED COURSE  Pertinent labs & imaging results that were available during my care of the patient were reviewed by me and considered in my  medical decision making (see chart for details).  Patient presents to the emergency department for evaluation of acute on chronic left lower extremity pain. Edema is mild. He has had prior x-rays of the left foot as well as recent negative left lower extremity ultrasound to rule out DVT. Patient denies any chest pain or difficulty breathing. Given his multiple, recent evaluations for similar pain I do not feel there is an emergent condition present. The left leg was wrapped with a compression Ace wrap. I instructed him to keep the leg elevated and offered 800 mg Motrin for pain but he refused saying he could not stop taking his Goody's Powder. Again, encouraged PCP follow up and gave contact information for several low-cost/free options in the area.   At this time, I do not feel there is any life-threatening condition present. I have reviewed and discussed all results (EKG, imaging, lab, urine as appropriate), exam findings with patient. I have reviewed nursing notes and appropriate previous records.  I feel the patient is safe to be discharged home without further emergent workup. Discussed usual and customary return precautions. Patient and family (if present) verbalize understanding and are comfortable with this plan.  Patient will follow-up with their primary care provider. If they do not have a primary care provider, information for follow-up has been provided to them. All questions have been answered.  ____________________________________________  FINAL CLINICAL IMPRESSION(S) / ED DIAGNOSES  Final diagnoses:  Left leg pain  Left foot pain     MEDICATIONS GIVEN DURING THIS VISIT:  Medications - No data to display   NEW OUTPATIENT MEDICATIONS STARTED DURING THIS VISIT:  None  Note:  This document was prepared using Dragon voice recognition software and may include unintentional dictation errors.  Alona Bene, MD Emergency Medicine    Sharian Delia, Arlyss Repress, MD 08/19/17 2103

## 2017-10-15 ENCOUNTER — Emergency Department (HOSPITAL_COMMUNITY)
Admission: EM | Admit: 2017-10-15 | Discharge: 2017-10-15 | Disposition: A | Discharge status: Home / Self Care | Payer: Medicaid Other | Attending: Emergency Medicine | Admitting: Emergency Medicine

## 2017-10-15 ENCOUNTER — Emergency Department (HOSPITAL_COMMUNITY): Payer: Medicaid Other

## 2017-10-15 ENCOUNTER — Encounter (HOSPITAL_COMMUNITY): Payer: Self-pay | Admitting: *Deleted

## 2017-10-15 DIAGNOSIS — M25562 Pain in left knee: Secondary | ICD-10-CM | POA: Diagnosis present

## 2017-10-15 DIAGNOSIS — I10 Essential (primary) hypertension: Secondary | ICD-10-CM | POA: Insufficient documentation

## 2017-10-15 DIAGNOSIS — F17210 Nicotine dependence, cigarettes, uncomplicated: Secondary | ICD-10-CM | POA: Diagnosis not present

## 2017-10-15 DIAGNOSIS — Z79899 Other long term (current) drug therapy: Secondary | ICD-10-CM | POA: Diagnosis not present

## 2017-10-15 MED ORDER — ACETAMINOPHEN 325 MG PO TABS
650.0000 mg | ORAL_TABLET | Freq: Once | ORAL | Status: AC
Start: 2017-10-15 — End: 2017-10-15
  Administered 2017-10-15: 650 mg via ORAL
  Filled 2017-10-15: qty 2

## 2017-10-15 NOTE — ED Notes (Signed)
Patient transported to X-ray 
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
## 2017-10-15 NOTE — ED Triage Notes (Signed)
Pt c/o pain to inside of left knee that because worse two days ago, denies any injury, pt has swelling and pain noted to inside of left knee area.

## 2017-10-15 NOTE — Discharge Instructions (Signed)
Use ice and heat on your knee for comfort. You can take ibuprofen 600 mg + acetaminophen 650 mg every 6 hrs for pain. DO NOT TAKE WITH GOODYS POWDERS!!!  Wear the knee sleeve for comfort. Call Dr Mort SawyersHarrison's office to get an appointment to have him evaluate your knee pain. He is an orthopedist (bone and joint specialist)

## 2017-10-15 NOTE — ED Notes (Signed)
ED Provider at bedside. 

## 2017-10-30 ENCOUNTER — Encounter (HOSPITAL_COMMUNITY): Payer: Self-pay | Admitting: *Deleted

## 2017-10-30 ENCOUNTER — Emergency Department (HOSPITAL_COMMUNITY)
Admission: EM | Admit: 2017-10-30 | Discharge: 2017-10-30 | Disposition: A | Discharge status: Home / Self Care | Payer: Medicaid Other | Attending: Emergency Medicine | Admitting: Emergency Medicine

## 2017-10-30 ENCOUNTER — Emergency Department (HOSPITAL_COMMUNITY): Payer: Medicaid Other

## 2017-10-30 ENCOUNTER — Other Ambulatory Visit: Payer: Self-pay

## 2017-10-30 DIAGNOSIS — Z79899 Other long term (current) drug therapy: Secondary | ICD-10-CM | POA: Diagnosis not present

## 2017-10-30 DIAGNOSIS — I10 Essential (primary) hypertension: Secondary | ICD-10-CM | POA: Diagnosis not present

## 2017-10-30 DIAGNOSIS — F17210 Nicotine dependence, cigarettes, uncomplicated: Secondary | ICD-10-CM | POA: Diagnosis not present

## 2017-10-30 DIAGNOSIS — J40 Bronchitis, not specified as acute or chronic: Secondary | ICD-10-CM | POA: Diagnosis not present

## 2017-10-30 DIAGNOSIS — R05 Cough: Secondary | ICD-10-CM | POA: Diagnosis present

## 2017-10-30 MED ORDER — AZITHROMYCIN 250 MG PO TABS: 250.0000 mg | ORAL_TABLET | Freq: Every day | ORAL | 0 refills | Status: DC

## 2017-10-30 MED ORDER — AZITHROMYCIN 250 MG PO TABS: ORAL_TABLET | ORAL | 0 refills | Status: DC

## 2017-10-30 MED ORDER — AZITHROMYCIN 250 MG PO TABS
500.0000 mg | ORAL_TABLET | Freq: Once | ORAL | Status: AC
  Administered 2017-10-30: 500 mg via ORAL

## 2017-10-30 MED ORDER — AZITHROMYCIN 250 MG PO TABS
ORAL_TABLET | ORAL | Status: AC
  Filled 2017-10-30: qty 2

## 2017-10-30 NOTE — ED Triage Notes (Signed)
Pt reports cough and congestion x 2-3 days. Pt also c/o bilateral rib pain due to coughing so hard. Pt has taken robitussin without relief. Pt's son has bronchitis.

## 2017-10-30 NOTE — ED Provider Notes (Signed)
[facility] EMERGENCY DEPARTMENT Provider Note   CSN: [id number] Arrival date & time: 10/30/17  1902     History   Chief Complaint Chief Complaint  Patient presents with  . Cough    HPI Gary Harrington is a 56 y.o. male.  The history is provided by the patient. No language interpreter was used.  Cough  This is a new problem. The current episode started more than 1 week ago. The problem occurs constantly. The cough is productive of sputum. There has been no fever. Associated symptoms include chest pain and shortness of breath. His past medical history does not include pneumonia.    Past Medical History:  Diagnosis Date  . Chronic pain   . Dizziness   . Headache   . Hypertension   . Stroke [facility](HCC)     There are no active problems to display for this patient.   Past Surgical History:  Procedure Laterality Date  . TONSILLECTOMY         Home Medications    Prior to Admission medications   Medication Sig Start Date End Date Taking? Authorizing Provider  albuterol (PROVENTIL HFA;VENTOLIN HFA) 108 (90 Base) MCG/ACT inhaler Inhale 2 puffs into the lungs every 4 (four) hours as needed for wheezing or shortness of breath (or coughing). 05/05/16   Dione BoozeGlick, David, MD  alprazolam Prudy Feeler(XANAX) 2 MG tablet Take 2 mg by mouth 2 (two) times daily.    [provider]  Aspirin-Acetaminophen-Caffeine (GOODY HEADACHE PO) Take 1 packet by mouth every 4 (four) hours.    [provider]  azithromycin (ZITHROMAX) 250 MG tablet Take 1 tablet (250 mg total) daily by mouth. Take first 2 tablets together, then 1 every day until finished. 10/30/17   Elson AreasSofia, Leslie K, PA-C  naproxen (NAPROSYN) 500 MG tablet Take 1 tablet (500 mg total) by mouth 2 (two) times daily. 08/01/17   Burgess AmorIdol, Julie, PA-C  traMADol (ULTRAM) 50 MG tablet Take 1 tablet (50 mg total) by mouth every 6 (six) hours as needed. 08/15/17   Bethann BerkshireZammit, Joseph, MD    Family History Family History  Problem Relation Age of Onset    . Stroke Father   . Heart failure Father     Social History Social History   Tobacco Use  . Smoking status: Current Every Day Smoker    Packs/day: 0.50    Years: 27.00    Pack years: 13.50    Types: Cigarettes  . Smokeless tobacco: Never Used  Substance Use Topics  . Alcohol use: No  . Drug use: No     Allergies   Codeine; Penicillins; and Prednisone   Review of Systems Review of Systems  Respiratory: Positive for cough and shortness of breath.   Cardiovascular: Positive for chest pain.  All other systems reviewed and are negative.    Physical Exam Updated Vital Signs BP 138/65 (BP Location: Right Arm)   Pulse 80   Temp 97.6 F (36.4 C) (Oral)   Resp 18   Ht 5\' 9"  (1.753 m)   Wt 95.3 kg (210 lb)   SpO2 96%   BMI 31.01 kg/m   Physical Exam  Constitutional: He appears well-developed and well-nourished.  HENT:  Head: Normocephalic and atraumatic.  Eyes: Conjunctivae are normal.  Neck: Neck supple.  Cardiovascular: Normal rate and regular rhythm.  No murmur heard. Pulmonary/Chest: Effort normal and breath sounds normal. No respiratory distress.  Abdominal: Soft. There is no tenderness.  Musculoskeletal: He exhibits no edema.  Neurological: He is  alert.  Skin: Skin is warm and dry.  Psychiatric: He has a normal mood and affect.  Nursing note and vitals reviewed.    ED Treatments / Results  Labs (all labs ordered are listed, but only abnormal results are displayed) Labs Reviewed - No data to display  EKG  EKG Interpretation None       Radiology Dg Chest 2 View  Result Date: 10/30/2017 CLINICAL DATA:  Initial evaluation for acute cough and congestion for 2-3 days. EXAM: CHEST  2 VIEW COMPARISON:  Prior radiograph from 07/23/2017. FINDINGS: The cardiac and mediastinal silhouettes are stable in size and contour, and remain within normal limits. The lungs are normally inflated. No airspace consolidation, pleural effusion, or pulmonary edema is  identified. There is no pneumothorax. No acute osseous abnormality identified. Remotely healed left-sided rib fractures noted. IMPRESSION: No active cardiopulmonary disease. Electronically Signed   By: Rise MuBenjamin  McClintock M.D.   On: 10/30/2017 19:41    Procedures Procedures (including critical care time)  Medications Ordered in ED Medications - No data to display   Initial Impression / Assessment and Plan / ED Course  I have reviewed the triage vital signs and the nursing notes.  Pertinent labs & imaging results that were available during my care of the patient were reviewed by me and considered in my medical decision making (see chart for details).     Pt request antibiotic.  Family member had same and got worse until antibiotics.    Final Clinical Impressions(s) / ED Diagnoses   Final diagnoses:  Bronchitis    ED Discharge Orders        Ordered    azithromycin (ZITHROMAX) 250 MG tablet  Daily     10/30/17 1959    An After Visit Summary was printed and given to the patient.    Osie CheeksSofia, Leslie K, PA-C 10/30/17 [place]2006    Kohut, Stephen, MD 11/03/17 1236

## 2017-10-31 ENCOUNTER — Emergency Department (HOSPITAL_COMMUNITY)
Admission: EM | Admit: 2017-10-31 | Discharge: 2017-10-31 | Disposition: A | Discharge status: Home / Self Care | Payer: Medicaid Other | Attending: Emergency Medicine | Admitting: Emergency Medicine

## 2017-10-31 ENCOUNTER — Other Ambulatory Visit: Payer: Self-pay

## 2017-10-31 ENCOUNTER — Encounter (HOSPITAL_COMMUNITY): Payer: Self-pay | Admitting: Emergency Medicine

## 2017-10-31 DIAGNOSIS — F17210 Nicotine dependence, cigarettes, uncomplicated: Secondary | ICD-10-CM | POA: Insufficient documentation

## 2017-10-31 DIAGNOSIS — Z79899 Other long term (current) drug therapy: Secondary | ICD-10-CM | POA: Insufficient documentation

## 2017-10-31 DIAGNOSIS — Z7982 Long term (current) use of aspirin: Secondary | ICD-10-CM | POA: Diagnosis not present

## 2017-10-31 DIAGNOSIS — K0889 Other specified disorders of teeth and supporting structures: Secondary | ICD-10-CM | POA: Insufficient documentation

## 2017-10-31 MED ORDER — CLINDAMYCIN HCL 150 MG PO CAPS
300.0000 mg | ORAL_CAPSULE | Freq: Once | ORAL | Status: AC
  Administered 2017-10-31: 300 mg via ORAL
  Filled 2017-10-31: qty 2

## 2017-10-31 MED ORDER — CLINDAMYCIN HCL 300 MG PO CAPS: 300.0000 mg | ORAL_CAPSULE | Freq: Four times a day (QID) | ORAL | 0 refills | Status: DC

## 2017-10-31 MED ORDER — ACETAMINOPHEN 325 MG PO TABS
650.0000 mg | ORAL_TABLET | Freq: Once | ORAL | Status: AC
  Administered 2017-10-31: 650 mg via ORAL
  Filled 2017-10-31: qty 2

## 2017-10-31 NOTE — ED Notes (Signed)
Pt missing many teeth and reports he [place] an [place] in [place] teeth pulled in Dec He believes it is Dr Bradly ChrisStroud  He reports he has had pain since this am at 0130  He also reports he took Azithromycin last night start it for a cough

## 2017-10-31 NOTE — ED Triage Notes (Signed)
Pt states he was here yesterday for a cough and was given antibiotics and started having a toothache around 1AM. Pt states his tooth is broken and doesn't know how it happened. Small amount of swelling noted to lower right jaw.

## 2017-10-31 NOTE — Discharge Instructions (Signed)
Please complete course of antibiotics as directed.  Follow-up with a dentist as soon as possible, follow up with your primary doctor as well.  Please return to the emergency department for reevaluation if any of the below scenarios occur.  Get help right away if: You cannot open your mouth. You are having trouble breathing or swallowing. You have a fever. You have worsening swelling.

## 2017-10-31 NOTE — ED Provider Notes (Signed)
[facility] Provider Note   CSN: [id number] Arrival date & time: 10/31/17  2011     History   Chief Complaint Chief Complaint  Patient presents with  . Dental Pain    HPI  Gary Harrington is a 56 y.o. Male who presents complaining of pain in his right lower front teeth and surrounding gum since 1 AM this morning.  Patient reports he has several broken teeth and has had problems with dental infections in the past.  Describes pain as a constant throbbing ache.  Patient reports some mild swelling over the right side of his chin, swelling does not extend under chin or into the neck . He has taken multiple Goody powders with no relief.  Patient reports pain when chewing on that side, but has been able to eat and drink normally no pain with swallowing, no difficulty breathing, sensation of throat swelling or shortness of breath. No fevers or chills.  He reports he has an appointment with an oral surgeon on December 1, but does not have a dentist that he sees regularly.        Past Medical History:  Diagnosis Date  . Chronic pain   . Dizziness   . Headache   . Hypertension   . Stroke [facility](HCC)     There are no active problems to display for this patient.   Past Surgical History:  Procedure Laterality Date  . TONSILLECTOMY         Home Medications    Prior to Admission medications   Medication Sig Start Date End Date Taking? Authorizing Provider  albuterol (PROVENTIL HFA;VENTOLIN HFA) 108 (90 Base) MCG/ACT inhaler Inhale 2 puffs into the lungs every 4 (four) hours as needed for wheezing or shortness of breath (or coughing). 05/05/16   Dione BoozeGlick, David, MD  alprazolam Prudy Feeler(XANAX) 2 MG tablet Take 2 mg by mouth 2 (two) times daily.    [provider]  Aspirin-Acetaminophen-Caffeine (GOODY HEADACHE PO) Take 1 packet by mouth every 4 (four) hours.    [provider]  azithromycin (ZITHROMAX) 250 MG tablet Take one a day beginning on 11/10 10/30/17    Elson AreasSofia, Leslie K, PA-C  naproxen (NAPROSYN) 500 MG tablet Take 1 tablet (500 mg total) by mouth 2 (two) times daily. 08/01/17   Burgess AmorIdol, Julie, PA-C  traMADol (ULTRAM) 50 MG tablet Take 1 tablet (50 mg total) by mouth every 6 (six) hours as needed. 08/15/17   Bethann BerkshireZammit, Joseph, MD    Family History Family History  Problem Relation Age of Onset  . Stroke Father   . Heart failure Father     Social History Social History   Tobacco Use  . Smoking status: Current Every Day Smoker    Packs/day: 0.50    Years: 27.00    Pack years: 13.50    Types: Cigarettes  . Smokeless tobacco: Never Used  Substance Use Topics  . Alcohol use: No  . Drug use: No     Allergies   Codeine; Penicillins; and Prednisone   Review of Systems Review of Systems  Constitutional: Negative for chills and fever.  HENT: Positive for dental problem, facial swelling and rhinorrhea. Negative for congestion, drooling, ear pain, mouth sores, sinus pressure, sore throat, trouble swallowing and voice change.   Eyes: Negative for pain.  Respiratory: Negative for cough, chest tightness, shortness of breath and stridor.   Cardiovascular: Negative for chest pain.  Gastrointestinal: Negative for abdominal pain, nausea and vomiting.  Skin: Negative for color  change and rash.     Physical Exam Updated Vital Signs BP 136/80 (BP Location: Right Arm)   Pulse 77   Temp 98.9 F (37.2 C) (Oral)   Resp 19   Ht 5\' 9"  (1.753 m)   Wt 95.3 kg (210 lb)   SpO2 98%   BMI 31.01 kg/m   Physical Exam  Constitutional: He appears well-developed and well-nourished. No distress.  HENT:  Head: Normocephalic and atraumatic.  Mouth/Throat: Uvula is midline. Abnormal dentition. No dental abscesses.    Teeth are in very poor dentition, the majority of the teeth have been pulled, and the remainder are broken and decaying, no gross abscess, no sublingual swelling or tenderness, no edema of the tongue, posterior oropharynx is clear, anterior  and posterior arches are visible.  Minimal facial swelling over the front of the lower jaw on the right side.  No swelling under the jaw.  No trismus or torticollis  Eyes: EOM are normal. Pupils are equal, round, and reactive to light. Right eye exhibits no discharge. Left eye exhibits no discharge.  No pain with extraocular movements  Neck: Normal range of motion. Neck supple.  Pulmonary/Chest: Effort normal and breath sounds normal. No stridor. No respiratory distress.  Neurological: He is alert. Coordination normal.  Skin: Skin is warm and dry. Capillary refill takes less than 2 seconds. He is not diaphoretic.  Psychiatric: He has a normal mood and affect. His behavior is normal.  Nursing note and vitals reviewed.    ED Treatments / Results  Labs (all labs ordered are listed, but only abnormal results are displayed) Labs Reviewed - No data to display  EKG  EKG Interpretation None       Radiology    Procedures Procedures (including critical care time)  Medications Ordered in ED Medications  clindamycin (CLEOCIN) capsule 300 mg (300 mg Oral Given 10/31/17 2202)  acetaminophen (TYLENOL) tablet 650 mg (650 mg Oral Given 10/31/17 2203)     Initial Impression / Assessment and Plan / ED Course  I have reviewed the triage vital signs and the nursing notes.  Pertinent labs & imaging results that were available during my care of the patient were reviewed by me and considered in my medical decision making (see chart for details).  Patient with toothache.  No gross abscess.  Exam unconcerning for Ludwig's angina or spread of infection.  Patient is overall well-appearing and vitals are normal. Will treat with clindamycin, as patient has a penicillin allergy and anti-inflammatories medicine.  Given Tylenol and first dose of antibiotics in the ED.  Urged patient to follow-up with dentist.  Dental resources provided.   Final Clinical Impressions(s) / ED Diagnoses   Final diagnoses:    Pain, dental  Toothache    ED Discharge Orders        Ordered    clindamycin (CLEOCIN) 300 MG capsule  4 times daily     10/31/17 2156       Dartha LodgeFord, Jeray Shugart N, PA-C 11/01/17 0121    Eulis JesterMcManus, Kathleen, DO 11/03/17 1005

## 2017-10-31 NOTE — ED Notes (Signed)
Pt seen here yesterday   Given antibiotic for cough- today complains of dental pain and swelling from a broken tooth  Pt with multiple caries and poor dental care

## 2017-11-01 ENCOUNTER — Emergency Department (HOSPITAL_COMMUNITY)
Admission: EM | Admit: 2017-11-01 | Discharge: 2017-11-01 | Disposition: A | Discharge status: Home / Self Care | Payer: Medicaid Other

## 2017-11-01 NOTE — ED Notes (Signed)
No answer in waiting room 

## 2017-11-01 NOTE — ED Notes (Signed)
Not in [facility]

## 2017-11-12 ENCOUNTER — Other Ambulatory Visit: Payer: Self-pay

## 2017-11-12 ENCOUNTER — Emergency Department (HOSPITAL_COMMUNITY): Payer: Medicaid Other

## 2017-11-12 ENCOUNTER — Encounter (HOSPITAL_COMMUNITY): Payer: Self-pay | Admitting: Emergency Medicine

## 2017-11-12 ENCOUNTER — Emergency Department (HOSPITAL_COMMUNITY)
Admission: EM | Admit: 2017-11-12 | Discharge: 2017-11-12 | Disposition: A | Discharge status: Home / Self Care | Payer: Medicaid Other | Attending: Emergency Medicine | Admitting: Emergency Medicine

## 2017-11-12 DIAGNOSIS — Z79899 Other long term (current) drug therapy: Secondary | ICD-10-CM | POA: Insufficient documentation

## 2017-11-12 DIAGNOSIS — Z7982 Long term (current) use of aspirin: Secondary | ICD-10-CM | POA: Insufficient documentation

## 2017-11-12 DIAGNOSIS — I10 Essential (primary) hypertension: Secondary | ICD-10-CM | POA: Diagnosis not present

## 2017-11-12 DIAGNOSIS — F17210 Nicotine dependence, cigarettes, uncomplicated: Secondary | ICD-10-CM

## 2017-11-12 DIAGNOSIS — Z8673 Personal history of transient ischemic attack (TIA), and cerebral infarction without residual deficits: Secondary | ICD-10-CM | POA: Diagnosis not present

## 2017-11-12 DIAGNOSIS — J00 Acute nasopharyngitis [common cold]: Secondary | ICD-10-CM | POA: Insufficient documentation

## 2017-11-12 DIAGNOSIS — R05 Cough: Secondary | ICD-10-CM | POA: Diagnosis present

## 2017-11-12 MED ORDER — BENZONATATE 100 MG PO CAPS
200.0000 mg | ORAL_CAPSULE | Freq: Once | ORAL | Status: AC
  Administered 2017-11-12: 200 mg via ORAL
  Filled 2017-11-12: qty 2

## 2017-11-12 MED ORDER — BENZONATATE 100 MG PO CAPS: 200.0000 mg | ORAL_CAPSULE | Freq: Three times a day (TID) | ORAL | 0 refills | Status: DC | PRN

## 2017-11-12 MED ORDER — DM-GUAIFENESIN ER 30-600 MG PO TB12: 1.0000 | ORAL_TABLET | Freq: Two times a day (BID) | ORAL | 0 refills | Status: AC

## 2017-11-12 NOTE — Discharge Instructions (Signed)
Rest and make sure you are drinking plenty of fluids (water, juice) while taking mucinex - this will help your cough be more productive.  You may also take motrin or tylenol for any fevers or to help with the rib pain you are experiencing with your cough.  Your chest xray today is clear with no signs of a bacterial infection, therefore antibiotics will not help you get over this viral infection.

## 2017-11-12 NOTE — ED Provider Notes (Signed)
[facility] Provider Note   CSN: [id number] Arrival date & time: 11/12/17  [id number]     History   Chief Complaint Chief Complaint  Patient presents with  . Cough    HPI Luther HearingSamuel T Cervi is a 56 y.o. male well known to this ed, presenting with concern of possible pneumonia.  He reports he battled a 2 week history of cough, congestion and shortness of breath having completed both a course of zithromax for bronchitis and clindamycin (although chart notes clindamycin was prescribed for dental infection).  He was feeling better until yesterday when he was around sick people coughing in his face.  He redeveloped cough which has been productive, nasal congestion without drainage and sore ribs from frequency of cough.  He reports subjective fever, denies chest pain, sob, wheezing n/v, palpitations.  He is a 2 ppd smoker.  He has had no medicines prior to arrival. He wants another antibiotic before it "gets worse".   The history is provided by the patient.    Past Medical History:  Diagnosis Date  . Chronic pain   . Dizziness   . Headache   . Hypertension   . Stroke [facility](HCC)     There are no active problems to display for this patient.   Past Surgical History:  Procedure Laterality Date  . TONSILLECTOMY         Home Medications    Prior to Admission medications   Medication Sig Start Date End Date Taking? Authorizing Provider  albuterol (PROVENTIL HFA;VENTOLIN HFA) 108 (90 Base) MCG/ACT inhaler Inhale 2 puffs into the lungs every 4 (four) hours as needed for wheezing or shortness of breath (or coughing). 05/05/16   Dione BoozeGlick, David, MD  alprazolam Prudy Feeler(XANAX) 2 MG tablet Take 2 mg by mouth 2 (two) times daily.    [provider]  Aspirin-Acetaminophen-Caffeine (GOODY HEADACHE PO) Take 1 packet by mouth every 4 (four) hours.    [provider]  azithromycin (ZITHROMAX) 250 MG tablet Take one a day beginning on 11/10 10/30/17   Elson AreasSofia, Leslie K, PA-C    benzonatate (TESSALON) 100 MG capsule Take 2 capsules (200 mg total) by mouth 3 (three) times daily as needed for cough. 11/12/17   Burgess AmorIdol, Sunnie Odden, PA-C  clindamycin (CLEOCIN) 300 MG capsule Take 1 capsule (300 mg total) 4 (four) times daily by mouth. X 7 days 10/31/17   Dartha LodgeFord, Kelsey N, PA-C  dextromethorphan-guaiFENesin [facility](MUCINEX DM) 30-600 MG 12hr tablet Take 1 tablet by mouth 2 (two) times daily for 7 days. 11/12/17 11/19/17  Burgess AmorIdol, Winry Egnew, PA-C  naproxen (NAPROSYN) 500 MG tablet Take 1 tablet (500 mg total) by mouth 2 (two) times daily. 08/01/17   Burgess AmorIdol, Ahmir Bracken, PA-C  traMADol (ULTRAM) 50 MG tablet Take 1 tablet (50 mg total) by mouth every 6 (six) hours as needed. 08/15/17   Bethann BerkshireZammit, Joseph, MD    Family History Family History  Problem Relation Age of Onset  . Stroke Father   . Heart failure Father     Social History Social History   Tobacco Use  . Smoking status: Current Every Day Smoker    Packs/day: 2.00    Years: 27.00    Pack years: 54.00    Types: Cigarettes  . Smokeless tobacco: Never Used  Substance Use Topics  . Alcohol use: No  . Drug use: No     Allergies   Codeine; Penicillins; and Prednisone   Review of Systems Review of Systems  Constitutional: Positive for fever. Negative for  chills.  HENT: Positive for congestion. Negative for ear pain, rhinorrhea, sinus pressure, sore throat, trouble swallowing and voice change.   Eyes: Negative for discharge.  Respiratory: Positive for cough. Negative for shortness of breath, wheezing and stridor.   Cardiovascular: Negative for chest pain.  Gastrointestinal: Negative for abdominal pain.  Genitourinary: Negative.      Physical Exam Updated Vital Signs BP 108/75   Pulse 69   Temp 97.7 F (36.5 C) (Oral)   Resp 16   Ht 5\' 9"  (1.753 m)   Wt 95.3 kg (210 lb)   SpO2 95%   BMI 31.01 kg/m   Physical Exam  Constitutional: He is oriented to person, place, and time. He appears well-developed and well-nourished.  HENT:   Head: Normocephalic and atraumatic.  Right Ear: Tympanic membrane and ear canal normal.  Left Ear: Tympanic membrane and ear canal normal.  Nose: Mucosal edema and rhinorrhea present.  Mouth/Throat: Uvula is midline, oropharynx is clear and moist and mucous membranes are normal. No oropharyngeal exudate, posterior oropharyngeal edema, posterior oropharyngeal erythema or tonsillar abscesses.  Eyes: Conjunctivae are normal.  Cardiovascular: Normal rate and normal heart sounds.  Pulmonary/Chest: Effort normal. No respiratory distress. He has decreased breath sounds. He has no wheezes. He has rhonchi in the left lower field. He has no rales.  Frequent cough.  Abdominal: Soft. There is no tenderness.  Musculoskeletal: Normal range of motion.  Neurological: He is alert and oriented to person, place, and time.  Skin: Skin is warm and dry. No rash noted.  Psychiatric: He has a normal mood and affect.     ED Treatments / Results  Labs (all labs ordered are listed, but only abnormal results are displayed) Labs Reviewed - No data to display  EKG  EKG Interpretation None       Radiology Dg Chest 2 View  Result Date: 11/12/2017 CLINICAL DATA:  Cough and congestion EXAM: CHEST  2 VIEW COMPARISON:  October 30, 2017 FINDINGS: There is no edema or consolidation. The heart size and pulmonary vascularity are normal. No adenopathy. There old healed rib fractures on the left. There is mild degenerative change in the thoracic spine. IMPRESSION: No edema or consolidation. Electronically Signed   By: Bretta Bang III M.D.   On: 11/12/2017 09:02    Procedures Procedures (including critical care time)  Medications Ordered in ED Medications  benzonatate (TESSALON) capsule 200 mg (not administered)     Initial Impression / Assessment and Plan / ED Course  I have reviewed the triage vital signs and the nursing notes.  Pertinent labs & imaging results that were available during my care of  the patient were reviewed by me and considered in my medical decision making (see chart for details).     Pt with uri sx, normal cxr, discussed sx tx, f/u with pcp for new or worsened sx.   The patient appears reasonably screened and/or stabilized for discharge and I doubt any other medical condition or other [facility] requiring further screening, evaluation, or treatment in the ED at this time prior to discharge.   Final Clinical Impressions(s) / ED Diagnoses   Final diagnoses:  Acute nasopharyngitis  Cigarette smoker    ED Discharge Orders        Ordered    benzonatate (TESSALON) 100 MG capsule  3 times daily PRN     11/12/17 0921    dextromethorphan-guaiFENesin (MUCINEX DM) 30-600 MG 12hr tablet  2 times daily     11/12/17 4010  Burgess Amordol, Termaine Roupp, PA-C 11/12/17 [id number]    Raeford RazorKohut, Stephen, MD 11/12/17 [phone]

## 2017-11-12 NOTE — ED Triage Notes (Signed)
Pt states he just finished abx for URI and began coughing again yesterday with a lot of congestion.

## 2017-11-12 NOTE — ED Notes (Signed)
Patient transported to X-ray via xray personal. Pt ambulating appropriately with a strong and steady gait; patient does not appear to have any signs of distress while ambulating.

## 2017-11-12 NOTE — ED Notes (Signed)
Patient denies pain and is resting comfortably.  

## 2017-12-13 ENCOUNTER — Emergency Department (HOSPITAL_COMMUNITY)
Admission: EM | Admit: 2017-12-13 | Discharge: 2017-12-13 | Disposition: A | Discharge status: Home / Self Care | Payer: Medicaid Other | Attending: Emergency Medicine | Admitting: Emergency Medicine

## 2017-12-13 ENCOUNTER — Other Ambulatory Visit: Payer: Self-pay

## 2017-12-13 ENCOUNTER — Encounter (HOSPITAL_COMMUNITY): Payer: Self-pay | Admitting: Adult Health

## 2017-12-13 DIAGNOSIS — F17210 Nicotine dependence, cigarettes, uncomplicated: Secondary | ICD-10-CM | POA: Insufficient documentation

## 2017-12-13 DIAGNOSIS — I10 Essential (primary) hypertension: Secondary | ICD-10-CM | POA: Insufficient documentation

## 2017-12-13 DIAGNOSIS — Z8673 Personal history of transient ischemic attack (TIA), and cerebral infarction without residual deficits: Secondary | ICD-10-CM | POA: Insufficient documentation

## 2017-12-13 DIAGNOSIS — Z79899 Other long term (current) drug therapy: Secondary | ICD-10-CM | POA: Diagnosis not present

## 2017-12-13 DIAGNOSIS — R600 Localized edema: Secondary | ICD-10-CM

## 2017-12-13 DIAGNOSIS — R2243 Localized swelling, mass and lump, lower limb, bilateral: Secondary | ICD-10-CM | POA: Insufficient documentation

## 2017-12-13 LAB — COMPREHENSIVE METABOLIC PANEL
ALT: 19 U/L (ref 17–63)
AST: 19 U/L (ref 15–41)
Albumin: 4 g/dL (ref 3.5–5.0)
Alkaline Phosphatase: 62 U/L (ref 38–126)
Anion gap: 12 (ref 5–15)
BUN: 19 mg/dL (ref 6–20)
CO2: 22 mmol/L (ref 22–32)
Calcium: 9.2 mg/dL (ref 8.9–10.3)
Chloride: 102 mmol/L (ref 101–111)
Creatinine, Ser: 0.91 mg/dL (ref 0.61–1.24)
GFR calc Af Amer: >60 mL/min (ref >60)
GFR calc non Af Amer: >60 mL/min (ref >60)
Glucose, Bld: 83 mg/dL (ref 65–99)
Potassium: 4.2 mmol/L (ref 3.5–5.1)
Sodium: 136 mmol/L (ref 135–145)
Total Bilirubin: 0.6 mg/dL (ref 0.3–1.2)
Total Protein: 6.9 g/dL (ref 6.5–8.1)

## 2017-12-13 LAB — CBC WITH DIFFERENTIAL/PLATELET
Basophils Absolute: 0 10*3/uL (ref 0.0–0.1)
Basophils Relative: 1 %
Eosinophils Absolute: 0.4 10*3/uL (ref 0.0–0.7)
Eosinophils Relative: 4 %
HCT: 43.4 % (ref 39.0–52.0)
Hemoglobin: 13.6 g/dL (ref 13.0–17.0)
Lymphocytes Relative: 27 %
Lymphs Abs: 2.3 10*3/uL (ref 0.7–4.0)
MCH: 21.1 pg — ABNORMAL LOW (ref 26.0–34.0)
MCHC: 31.3 g/dL (ref 30.0–36.0)
MCV: 67.2 fL — ABNORMAL LOW (ref 78.0–100.0)
Monocytes Absolute: 0.5 10*3/uL (ref 0.1–1.0)
Monocytes Relative: 5 %
Neutro Abs: 5.6 10*3/uL (ref 1.7–7.7)
Neutrophils Relative %: 63 %
Platelets: 233 10*3/uL (ref 150–400)
RBC: 6.46 MIL/uL — ABNORMAL HIGH (ref 4.22–5.81)
RDW: 15 % (ref 11.5–15.5)
WBC: 8.8 10*3/uL (ref 4.0–10.5)

## 2017-12-13 LAB — MAGNESIUM: Magnesium: 1.9 mg/dL (ref 1.7–2.4)

## 2017-12-13 MED ORDER — FUROSEMIDE 10 MG/ML IJ SOLN: 40.0000 mg | Freq: Once | INTRAMUSCULAR | Status: DC

## 2017-12-13 MED ORDER — FUROSEMIDE 40 MG PO TABS
40.0000 mg | ORAL_TABLET | Freq: Once | ORAL | Status: AC
  Administered 2017-12-13: 40 mg via ORAL
  Filled 2017-12-13: qty 1

## 2017-12-13 MED ORDER — FUROSEMIDE 40 MG PO TABS: 40.0000 mg | ORAL_TABLET | Freq: Every day | ORAL | 0 refills | Status: DC

## 2017-12-13 NOTE — Discharge Instructions (Signed)
Medications: Lasix  Treatment: Take Lasix as prescribed for 3 days. Keep your legs elevated when you are not walking or standing. Wear compression stockings (can be found at any drug store or [employer]).  Follow-up: Please follow up and establish care with a primary care provider for further evaluation and treatment of of your leg swelling. Please return to the emergency department if you develop any new or worsening symptoms.

## 2017-12-13 NOTE — ED Provider Notes (Signed)
[facility] EMERGENCY DEPARTMENT Provider Note   CSN: [id number] Arrival date & time: 12/13/17  0930     History   Chief Complaint Chief Complaint  Patient presents with  . Leg Swelling    HPI Gary Harrington is a 56 y.o. male with history of hypertension, CVA, chronic left knee pain who presents with acute on chronic lower extremity edema.  Patient reports he has had this swelling for 3 months and has been worked up in the past for it.  Patient has had negative ultrasound for DVT and negative x-ray.  He reports waking up with worsening swelling today.  He is also had some cramping of his feet and toes over the past couple days, that was worse this morning as well.  He denies calf pain or change in his knee pain.  Patient reports he was given 5 pills of Lasix last week which improved temporarily, however his edema is back.  He denies any chest pain or shortness of breath.  Patient reports he is on his feet at least 8 hours every day.  Patient does not have a PCP, but is trying to establish care with one.  Patient takes Marlin CanaryGoody powders daily for his pain.  HPI  Past Medical History:  Diagnosis Date  . Chronic pain   . Dizziness   . Headache   . Hypertension   . Stroke [facility](HCC)     There are no active problems to display for this patient.   Past Surgical History:  Procedure Laterality Date  . TONSILLECTOMY         Home Medications    Prior to Admission medications   Medication Sig Start Date End Date Taking? Authorizing Provider  albuterol (PROVENTIL HFA;VENTOLIN HFA) 108 (90 Base) MCG/ACT inhaler Inhale 2 puffs into the lungs every 4 (four) hours as needed for wheezing or shortness of breath (or coughing). 05/05/16  Yes Dione BoozeGlick, David, MD  alprazolam Prudy Feeler(XANAX) 2 MG tablet Take 2 mg by mouth 2 (two) times daily.   Yes [provider]  aspirin 81 MG chewable tablet Chew 81 mg by mouth every other day.   Yes [provider]  Aspirin-Acetaminophen-Caffeine (GOODY  HEADACHE PO) Take 1-2 packets by mouth every 4 (four) hours.    Yes [provider]  furosemide (LASIX) 40 MG tablet Take 1 tablet (40 mg total) by mouth daily for 3 days. 12/13/17 12/16/17  Emi HolesLaw, Zacariah Belue M, PA-C    Family History Family History  Problem Relation Age of Onset  . Stroke Father   . Heart failure Father     Social History Social History   Tobacco Use  . Smoking status: Current Every Day Smoker    Packs/day: 2.00    Years: 27.00    Pack years: 54.00    Types: Cigarettes  . Smokeless tobacco: Never Used  Substance Use Topics  . Alcohol use: No  . Drug use: No     Allergies   Codeine; Penicillins; and Prednisone   Review of Systems Review of Systems  Constitutional: Negative for chills and fever.  HENT: Negative for facial swelling and sore throat.   Respiratory: Negative for shortness of breath.   Cardiovascular: Positive for leg swelling. Negative for chest pain.  Gastrointestinal: Negative for abdominal pain, nausea and vomiting.  Genitourinary: Negative for dysuria.  Musculoskeletal: Positive for arthralgias (L knee, chronic). Negative for back pain.  Skin: Negative for rash and wound.  Neurological: Negative for headaches.  Psychiatric/Behavioral: The patient is not  nervous/anxious.      Physical Exam Updated Vital Signs BP 135/79 (BP Location: Left Arm)   Pulse 62   Temp 97.6 F (36.4 C) (Oral)   Resp 20   Ht 5\' 9"  (1.753 m)   Wt 95.3 kg (210 lb)   SpO2 96%   BMI 31.01 kg/m   Physical Exam  Constitutional: He appears well-developed and well-nourished. No distress.  HENT:  Head: Normocephalic and atraumatic.  Mouth/Throat: Oropharynx is clear and moist. No oropharyngeal exudate.  Eyes: Conjunctivae are normal. Pupils are equal, round, and reactive to light. Right eye exhibits no discharge. Left eye exhibits no discharge. No scleral icterus.  Neck: Normal range of motion. Neck supple. No thyromegaly present.  Cardiovascular:  Normal rate, regular rhythm, normal heart sounds and intact distal pulses. Exam reveals no gallop and no friction rub.  No murmur heard. DP pulses intact bilaterally  Pulmonary/Chest: Effort normal and breath sounds normal. No stridor. No respiratory distress. He has no wheezes. He has no rales.  Abdominal: Soft. Bowel sounds are normal. He exhibits no distension. There is no tenderness. There is no rebound and no guarding.  Musculoskeletal: He exhibits edema (Bilateral lower extremities, left subtly more than right).  No calf tenderness or erythema; medial left knee joint line tenderness which is chronic for the patient  Lymphadenopathy:    He has no cervical adenopathy.  Neurological: He is alert. Coordination normal.  Skin: Skin is warm and dry. No rash noted. He is not diaphoretic. No pallor.  Psychiatric: He has a normal mood and affect.  Nursing note and vitals reviewed.    ED Treatments / Results  Labs (all labs ordered are listed, but only abnormal results are displayed) Labs Reviewed  CBC WITH DIFFERENTIAL/PLATELET - Abnormal; Notable for the following components:      Result Value   RBC 6.46 (*)    MCV 67.2 (*)    MCH 21.1 (*)    All other components within normal limits  COMPREHENSIVE METABOLIC PANEL  MAGNESIUM    EKG  EKG Interpretation None       Radiology No results found.  Procedures Procedures (including critical care time)  Medications Ordered in ED Medications  furosemide (LASIX) tablet 40 mg (40 mg Oral Given 12/13/17 1243)     Initial Impression / Assessment and Plan / ED Course  I have reviewed the triage vital signs and the nursing notes.  Pertinent labs & imaging results that were available during my care of the patient were reviewed by me and considered in my medical decision making (see chart for details).     Patient with a 65-month history of bilateral lower extremity edema.  Patient has been worked up since onset for DVT as well as  x-rays and labs.  Labs repeated today which are within normal limits, including potassium and magnesium.  Will give patient 40 mg Lasix here and discharge home with 3 more days.  Patient advised to keep legs elevated whenever not walking on it.  Patient advised to follow-up and establish care with a primary care provider.  Low suspicion for DVT or CHF exacerbation at this time.  No calf pain or erythema.  Lungs are clear to auscultation.  Patient denies shortness of breath or chest pain.  This seems to be a chronic problem most likely due to patient spending a lot of time on his feet.  Return precautions discussed.  Patient understands and agrees with plan.  Patient vitals stable and discharged in  satisfactory condition.  Final Clinical Impressions(s) / ED Diagnoses   Final diagnoses:  Bilateral lower extremity edema    ED Discharge Orders        Ordered    furosemide (LASIX) 40 MG tablet  Daily     12/13/17 1254       Emi HolesLaw, Shivani Barrantes M, PA-C 12/13/17 1659    Gauge JesterMcManus, Kathleen, DO 12/15/17 [phone]

## 2017-12-13 NOTE — ED Triage Notes (Signed)
PResents with 3 months of left leg swelling, he states he has taken 5 fluid pills from [facility] last week and they haven't helped. He reports left knee pain without relief of pain from GOODY powders. Bilateral leg swelling noted.

## 2017-12-17 ENCOUNTER — Telehealth: Payer: Self-pay

## 2017-12-17 NOTE — Telephone Encounter (Signed)
Pt is on a list of Top ED visitors. Called pt today on 12/17/2017 to the number provided, [phone]. No answer. Pts voicemail was full, was not able to leave a voice mail. Will attempt to call pt at a different date and time.    Mariacristina Aday R. [place]Rangel, [place] [phone]

## 2017-12-20 ENCOUNTER — Emergency Department (HOSPITAL_COMMUNITY)
Admission: EM | Admit: 2017-12-20 | Discharge: 2017-12-20 | Disposition: A | Discharge status: Home / Self Care | Payer: Medicaid Other | Attending: Emergency Medicine | Admitting: Emergency Medicine

## 2017-12-20 ENCOUNTER — Encounter (HOSPITAL_COMMUNITY): Payer: Self-pay | Admitting: Emergency Medicine

## 2017-12-20 DIAGNOSIS — Z79899 Other long term (current) drug therapy: Secondary | ICD-10-CM | POA: Diagnosis not present

## 2017-12-20 DIAGNOSIS — T161XXA Foreign body in right ear, initial encounter: Secondary | ICD-10-CM | POA: Insufficient documentation

## 2017-12-20 DIAGNOSIS — I10 Essential (primary) hypertension: Secondary | ICD-10-CM | POA: Insufficient documentation

## 2017-12-20 DIAGNOSIS — Y999 Unspecified external cause status: Secondary | ICD-10-CM | POA: Insufficient documentation

## 2017-12-20 DIAGNOSIS — X58XXXA Exposure to other specified factors, initial encounter: Secondary | ICD-10-CM | POA: Insufficient documentation

## 2017-12-20 DIAGNOSIS — Y929 Unspecified place or not applicable: Secondary | ICD-10-CM | POA: Diagnosis not present

## 2017-12-20 DIAGNOSIS — F17210 Nicotine dependence, cigarettes, uncomplicated: Secondary | ICD-10-CM | POA: Diagnosis not present

## 2017-12-20 DIAGNOSIS — Y9384 Activity, sleeping: Secondary | ICD-10-CM | POA: Insufficient documentation

## 2017-12-20 DIAGNOSIS — Z8673 Personal history of transient ischemic attack (TIA), and cerebral infarction without residual deficits: Secondary | ICD-10-CM | POA: Insufficient documentation

## 2017-12-20 NOTE — ED Triage Notes (Signed)
Pt reports bug in right ear.

## 2017-12-20 NOTE — ED Provider Notes (Signed)
[facility] Provider [facility]   CSN: [id number] Arrival date & time: 12/20/17  [id number]     History   Chief Complaint Chief Complaint  Patient presents with  . Foreign Body in Ear    HPI Luther HearingSamuel T Stegenga is a 56 y.o. male.  HPI Luther HearingSamuel T Halberg is a 56 y.o. male presents to emergency department complaining of a foreign body in the right ear.  Patient states he woke up approximately 2 hours ago with sensation of something moving in his right ear.  He states that he is not having pain, just discomfort.  He reports cutting down some trees yesterday, and states that he pulled many bugs off of him when he got home.  Patient states he did not try any treatment prior to coming in.  He states that he still moving.  He reports 2 prior injuries to the right ear, states "ruptured my eardrum twice."  Reports decreased hearing.  Denies any other associated symptoms.  Past Medical History:  Diagnosis Date  . Chronic pain   . Dizziness   . Headache   . Hypertension   . Stroke [facility](HCC)     There are no active problems to display for this patient.   Past Surgical History:  Procedure Laterality Date  . TONSILLECTOMY         Home Medications    Prior to Admission medications   Medication Sig Start Date End Date Taking? Authorizing Provider  albuterol (PROVENTIL HFA;VENTOLIN HFA) 108 (90 Base) MCG/ACT inhaler Inhale 2 puffs into the lungs every 4 (four) hours as needed for wheezing or shortness of breath (or coughing). 05/05/16   Dione BoozeGlick, David, MD  alprazolam Prudy Feeler(XANAX) 2 MG tablet Take 2 mg by mouth 2 (two) times daily.    [provider]  aspirin 81 MG chewable tablet Chew 81 mg by mouth every other day.    [provider]  Aspirin-Acetaminophen-Caffeine (GOODY HEADACHE PO) Take 1-2 packets by mouth every 4 (four) hours.     [provider]  furosemide (LASIX) 40 MG tablet Take 1 tablet (40 mg total) by mouth daily for 3 days. 12/13/17 12/16/17  Emi HolesLaw,  Alexandra M, PA-C    Family History Family History  Problem Relation Age of Onset  . Stroke Father   . Heart failure Father     Social History Social History   Tobacco Use  . Smoking status: Current Every Day Smoker    Packs/day: 2.00    Years: 27.00    Pack years: 54.00    Types: Cigarettes  . Smokeless tobacco: Never Used  Substance Use Topics  . Alcohol use: No  . Drug use: No     Allergies   Codeine; Penicillins; and Prednisone   Review of Systems Review of Systems  Constitutional: Negative for chills and fever.  HENT: Positive for ear pain. Negative for rhinorrhea.   Neurological: Negative for headaches.  All other systems reviewed and are negative.    Physical Exam Updated Vital Signs BP 125/84 (BP Location: Right Arm)   Pulse 65   Temp 97.6 F (36.4 C) (Oral)   Resp 16   Ht 5\' 9"  (1.753 m)   Wt 95.3 kg (210 lb)   SpO2 100%   BMI 31.01 kg/m   Physical Exam  Constitutional: He appears well-developed and well-nourished. No distress.  HENT:  A live, moving insect in the right ear.  Left ear canal and TM normal.  Eyes: Conjunctivae are normal.  Neck:  Neck supple.  Cardiovascular: Normal rate.  Pulmonary/Chest: No respiratory distress.  Abdominal: He exhibits no distension.  Skin: Skin is warm and dry.  Nursing note and vitals reviewed.    ED Treatments / Results  Labs (all labs ordered are listed, but only abnormal results are displayed) Labs Reviewed - No data to display  EKG  EKG Interpretation None       Radiology No results found.  Procedures .Foreign Body Removal Date/Time: 12/20/2017 8:08 AM Performed by: Jaynie CrumbleKirichenko, Amnah Breuer, PA-C Authorized by: Jaynie CrumbleKirichenko, Airiel Oblinger, PA-C  Consent: Verbal consent obtained. Consent given by: patient Patient understanding: patient states understanding of the procedure being performed Body area: ear Location details: right ear Localization method: ENT speculum Removal mechanism:  curette Complexity: simple 1 objects recovered. Objects recovered: insect Post-procedure assessment: foreign body removed Patient tolerance: Patient tolerated the procedure well with no immediate complications   (including critical care time)  Medications Ordered in ED Medications - No data to display   Initial Impression / Assessment and Plan / ED Course  I have reviewed the triage vital signs and the nursing notes.  Pertinent labs & imaging results that were available during my care of the patient were reviewed by me and considered in my medical decision making (see chart for details).     Patient with a live bug in the right ear.  As soon as I placed the curette in the ear, the bug crawled out.  There is no evidence of any damage to the ear canal or TM.  Patient is feeling better.  Will be discharged home with outpatient follow-up as needed.  Vitals:   12/20/17 0720  BP: 125/84  Pulse: 65  Resp: 16  Temp: 97.6 F (36.4 C)  TempSrc: Oral  SpO2: 100%  Weight: 95.3 kg (210 lb)  Height: 5\' 9"  (1.753 m)     Final Clinical Impressions(s) / ED Diagnoses   Final diagnoses:  Foreign body in right ear, initial encounter    ED Discharge Orders    None       Jaynie CrumbleKirichenko, Navaya Wiatrek, PA-C 12/20/17 [id number]    Donnetta Hutchingook, Brian, MD 12/22/17 1359

## 2017-12-20 NOTE — Discharge Instructions (Signed)
Please follow up as needed 

## 2018-01-11 ENCOUNTER — Telehealth: Payer: Self-pay

## 2018-01-11 NOTE — Telephone Encounter (Signed)
Pt is on a list of Top ED visitors. Called pt today 01/11/18 for a second time to the number provided, [phone]. No answer. The number seems to be the patients spouses number. The voicemail was full. Was not able to leave a message. Will try to call again on a later date and time.   Roark Rufo R. [place], [place] [phone]

## 2018-03-11 ENCOUNTER — Other Ambulatory Visit: Payer: Self-pay

## 2018-03-11 ENCOUNTER — Emergency Department (HOSPITAL_COMMUNITY)
Admission: EM | Admit: 2018-03-11 | Discharge: 2018-03-11 | Disposition: A | Discharge status: Home / Self Care | Payer: Medicaid Other | Attending: Emergency Medicine | Admitting: Emergency Medicine

## 2018-03-11 ENCOUNTER — Encounter (HOSPITAL_COMMUNITY): Payer: Self-pay | Admitting: Emergency Medicine

## 2018-03-11 DIAGNOSIS — Z7982 Long term (current) use of aspirin: Secondary | ICD-10-CM | POA: Insufficient documentation

## 2018-03-11 DIAGNOSIS — I10 Essential (primary) hypertension: Secondary | ICD-10-CM | POA: Insufficient documentation

## 2018-03-11 DIAGNOSIS — M79641 Pain in right hand: Secondary | ICD-10-CM | POA: Diagnosis present

## 2018-03-11 DIAGNOSIS — L720 Epidermal cyst: Secondary | ICD-10-CM | POA: Insufficient documentation

## 2018-03-11 DIAGNOSIS — F17210 Nicotine dependence, cigarettes, uncomplicated: Secondary | ICD-10-CM | POA: Insufficient documentation

## 2018-03-11 MED ORDER — BACITRACIN ZINC 500 UNIT/GM EX OINT
TOPICAL_OINTMENT | CUTANEOUS | Status: AC
  Filled 2018-03-11: qty 0.9

## 2018-03-11 NOTE — ED Provider Notes (Signed)
[facility] Provider Note   CSN: [id number] Arrival date & time: 03/11/18  [id number]     History   Chief Complaint Chief Complaint  Patient presents with  . Hand Pain    HPI Luther HearingSamuel T Freese is a 57 y.o. male.  Complaint is draining area on finger  HPI 57 year old male.  States that a piece of wire puncture his right index finger 1990.  He said a small area of "thick skin" since that time.  Started draining.  He states it "looks like it is going to fall off".  Past Medical History:  Diagnosis Date  . Chronic pain   . Dizziness   . Headache   . Hypertension   . Stroke [facility](HCC)     There are no active problems to display for this patient.   Past Surgical History:  Procedure Laterality Date  . TONSILLECTOMY         Home Medications    Prior to Admission medications   Medication Sig Start Date End Date Taking? Authorizing Provider  albuterol (PROVENTIL HFA;VENTOLIN HFA) 108 (90 Base) MCG/ACT inhaler Inhale 2 puffs into the lungs every 4 (four) hours as needed for wheezing or shortness of breath (or coughing). 05/05/16   Dione BoozeGlick, David, MD  alprazolam Prudy Feeler(XANAX) 2 MG tablet Take 2 mg by mouth 2 (two) times daily.    [provider]  aspirin 81 MG chewable tablet Chew 81 mg by mouth every other day.    [provider]  Aspirin-Acetaminophen-Caffeine (GOODY HEADACHE PO) Take 1-2 packets by mouth every 4 (four) hours.     [provider]  furosemide (LASIX) 40 MG tablet Take 1 tablet (40 mg total) by mouth daily for 3 days. 12/13/17 12/16/17  Emi HolesLaw, Alexandra M, PA-C    Family History Family History  Problem Relation Age of Onset  . Stroke Father   . Heart failure Father     Social History Social History   Tobacco Use  . Smoking status: Current Every Day Smoker    Packs/day: 2.00    Years: 27.00    Pack years: 54.00    Types: Cigarettes  . Smokeless tobacco: Never Used  Substance Use Topics  . Alcohol use: No  . Drug use:  No     Allergies   Codeine; Penicillins; and Prednisone   Review of Systems Review of Systems  Constitutional: Negative for appetite change, chills, diaphoresis, fatigue and fever.  HENT: Negative for mouth sores, sore throat and trouble swallowing.   Eyes: Negative for visual disturbance.  Respiratory: Negative for cough, chest tightness, shortness of breath and wheezing.   Cardiovascular: Negative for chest pain.  Gastrointestinal: Negative for abdominal distention, abdominal pain, diarrhea, nausea and vomiting.  Endocrine: Negative for polydipsia, polyphagia and polyuria.  Genitourinary: Negative for dysuria, frequency and hematuria.  Musculoskeletal: Negative for gait problem.  Skin: Negative for color change, pallor and rash.       Right small finger pain  Neurological: Negative for dizziness, syncope, light-headedness and headaches.  Hematological: Does not bruise/bleed easily.  Psychiatric/Behavioral: Negative for behavioral problems and confusion.     Physical Exam Updated Vital Signs BP 112/67 (BP Location: Right Arm)   Pulse 61   Temp 98 F (36.7 C) (Oral)   Resp 18   Ht 5\' 9"  (1.753 m)   Wt 95.7 kg (211 lb)   SpO2 100%   BMI 31.16 kg/m   Physical Exam  Musculoskeletal:  I am limited to the right hand.  There is a small area of thickened skin adjacent to the volar aspect of the right small finger PIP volar/ulnar lateral.  His FDS and FDP tendon function are normal.  His sensation is normal.  This appears to be a ruptured inclusion cyst.  The external aspect of this appears is thickened skin and is loose and auto amputating     ED Treatments / Results  Labs (all labs ordered are listed, but only abnormal results are displayed) Labs Reviewed - No data to display  EKG  EKG Interpretation None       Radiology No results found.  Procedures Procedures (including critical care time)  Medications Ordered in ED Medications - No data to  display   Initial Impression / Assessment and Plan / ED Course  I have reviewed the triage vital signs and the nursing notes.  Pertinent labs & imaging results that were available during my care of the patient were reviewed by me and considered in my medical decision making (see chart for details).    I offered to excise a small piece of loose skin.  He declines.  He will keep covered with antibiotic ointment and Band-Aid.  Tylenol or Motrin for pain.  Final Clinical Impressions(s) / ED Diagnoses   Final diagnoses:  Right hand pain  Ruptured epithelial inclusion cyst    ED Discharge Orders    None       Rolland Porter, MD 03/11/18 [phone]

## 2018-03-11 NOTE — Discharge Instructions (Signed)
Keep area clean. Keep covered with antibiotic ointment and bandaid

## 2018-03-11 NOTE — ED Triage Notes (Signed)
Pt states he had a finger injury in the '90s.  Pt states now having a Knot" that was taken off by a log of wood.

## 2018-03-23 ENCOUNTER — Other Ambulatory Visit: Payer: Self-pay

## 2018-03-23 ENCOUNTER — Encounter (HOSPITAL_COMMUNITY): Payer: Self-pay | Admitting: Emergency Medicine

## 2018-03-23 ENCOUNTER — Emergency Department (HOSPITAL_COMMUNITY): Payer: Medicaid Other

## 2018-03-23 ENCOUNTER — Emergency Department (HOSPITAL_COMMUNITY)
Admission: EM | Admit: 2018-03-23 | Discharge: 2018-03-23 | Disposition: A | Discharge status: Home / Self Care | Payer: Medicaid Other | Attending: Emergency Medicine | Admitting: Emergency Medicine

## 2018-03-23 DIAGNOSIS — Z79899 Other long term (current) drug therapy: Secondary | ICD-10-CM | POA: Diagnosis not present

## 2018-03-23 DIAGNOSIS — I10 Essential (primary) hypertension: Secondary | ICD-10-CM | POA: Insufficient documentation

## 2018-03-23 DIAGNOSIS — Z7982 Long term (current) use of aspirin: Secondary | ICD-10-CM | POA: Insufficient documentation

## 2018-03-23 DIAGNOSIS — R05 Cough: Secondary | ICD-10-CM | POA: Diagnosis present

## 2018-03-23 DIAGNOSIS — F17210 Nicotine dependence, cigarettes, uncomplicated: Secondary | ICD-10-CM | POA: Diagnosis not present

## 2018-03-23 DIAGNOSIS — J209 Acute bronchitis, unspecified: Secondary | ICD-10-CM

## 2018-03-23 MED ORDER — DOXYCYCLINE HYCLATE 100 MG PO CAPS: 100.0000 mg | ORAL_CAPSULE | Freq: Two times a day (BID) | ORAL | 0 refills | Status: DC

## 2018-03-23 MED ORDER — ALBUTEROL SULFATE HFA 108 (90 BASE) MCG/ACT IN AERS
2.0000 | INHALATION_SPRAY | Freq: Once | RESPIRATORY_TRACT | Status: AC
  Administered 2018-03-23: 2 via RESPIRATORY_TRACT
  Filled 2018-03-23: qty 6.7

## 2018-03-23 MED ORDER — DOXYCYCLINE HYCLATE 100 MG PO TABS
100.0000 mg | ORAL_TABLET | Freq: Once | ORAL | Status: AC
Start: 2018-03-23 — End: 2018-03-23
  Administered 2018-03-23: 100 mg via ORAL
  Filled 2018-03-23: qty 1

## 2018-03-23 MED ORDER — HYDROCOD POLST-CPM POLST ER 10-8 MG/5ML PO SUER
5.0000 mL | Freq: Once | ORAL | Status: AC
  Administered 2018-03-23: 5 mL via ORAL
  Filled 2018-03-23: qty 5

## 2018-03-23 MED ORDER — PROMETHAZINE-DM 6.25-15 MG/5ML PO SYRP: 5.0000 mL | ORAL_SOLUTION | Freq: Four times a day (QID) | ORAL | 0 refills | Status: DC | PRN

## 2018-03-23 MED ORDER — DIPHENHYDRAMINE HCL 12.5 MG/5ML PO ELIX
12.5000 mg | ORAL_SOLUTION | Freq: Once | ORAL | Status: AC
  Administered 2018-03-23: 12.5 mg via ORAL
  Filled 2018-03-23: qty 5

## 2018-03-23 MED ORDER — DEXAMETHASONE SODIUM PHOSPHATE 10 MG/ML IJ SOLN
10.0000 mg | Freq: Once | INTRAMUSCULAR | Status: DC
  Filled 2018-03-23: qty 1

## 2018-03-23 NOTE — ED Provider Notes (Signed)
[facility] EMERGENCY DEPARTMENT Provider Note   CSN: [id number] Arrival date & time: 03/23/18  1613     History   Chief Complaint Chief Complaint  Patient presents with  . Cough    HPI Gary Harrington is a 57 y.o. male.  The history is provided by the patient.  Cough  This is a new problem. The current episode started more than 2 days ago. The problem occurs hourly. The problem has been gradually worsening. The cough is productive of sputum. There has been no fever. Associated symptoms include chills and shortness of breath. Pertinent negatives include no chest pain and no wheezing. He has tried cough syrup for the symptoms. The treatment provided no relief. He is a smoker. His past medical history is significant for bronchitis.    Past Medical History:  Diagnosis Date  . Chronic pain   . Dizziness   . Headache   . Hypertension   . Stroke [facility])     There are no active problems to display for this patient.   Past Surgical History:  Procedure Laterality Date  . TONSILLECTOMY          Home Medications    Prior to Admission medications   Medication Sig Start Date End Date Taking? Authorizing Provider  albuterol (PROVENTIL HFA;VENTOLIN HFA) 108 (90 Base) MCG/ACT inhaler Inhale 2 puffs into the lungs every 4 (four) hours as needed for wheezing or shortness of breath (or coughing). 05/05/16   Dione Booze, MD  alprazolam Prudy Feeler) 2 MG tablet Take 2 mg by mouth 2 (two) times daily.    [provider]  aspirin 81 MG chewable tablet Chew 81 mg by mouth every other day.    [provider]  Aspirin-Acetaminophen-Caffeine (GOODY HEADACHE PO) Take 1-2 packets by mouth every 4 (four) hours.     [provider]  furosemide (LASIX) 40 MG tablet Take 1 tablet (40 mg total) by mouth daily for 3 days. 12/13/17 12/16/17  Emi Holes, PA-C    Family History Family History  Problem Relation Age of Onset  . Stroke Father   . Heart failure Father      Social History Social History   Tobacco Use  . Smoking status: Current Every Day Smoker    Packs/day: 2.00    Years: 27.00    Pack years: 54.00    Types: Cigarettes  . Smokeless tobacco: Never Used  Substance Use Topics  . Alcohol use: No  . Drug use: No     Allergies   Codeine; Penicillins; and Prednisone   Review of Systems Review of Systems  Constitutional: Positive for chills. Negative for activity change.       All ROS Neg except as noted in HPI  HENT: Negative for nosebleeds.   Eyes: Negative for photophobia and discharge.  Respiratory: Positive for cough and shortness of breath. Negative for wheezing.   Cardiovascular: Negative for chest pain and palpitations.  Gastrointestinal: Negative for abdominal pain and blood in stool.  Genitourinary: Negative for dysuria, frequency and hematuria.  Musculoskeletal: Negative for arthralgias, back pain and neck pain.  Skin: Negative.   Neurological: Negative for dizziness, seizures and speech difficulty.  Psychiatric/Behavioral: Negative for confusion and hallucinations.     Physical Exam Updated Vital Signs BP 101/70   Pulse 82   Resp 18   Ht 5\' 9"  (1.753 m)   Wt 95.7 kg (211 lb)   SpO2 94%   BMI 31.16 kg/m   Physical Exam  Constitutional:  He is oriented to person, place, and time. He appears well-developed and well-nourished.  Non-toxic appearance.  HENT:  Head: Normocephalic.  Right Ear: Tympanic membrane and external ear normal.  Left Ear: Tympanic membrane and external ear normal.  Eyes: Pupils are equal, round, and reactive to light. EOM and lids are normal.  Neck: Normal range of motion. Neck supple. Carotid bruit is not present.  Cardiovascular: Normal rate, regular rhythm, intact distal pulses and normal pulses.  Murmur heard.  Systolic murmur is present with a grade of 2/6. Pulmonary/Chest: Breath sounds normal. No respiratory distress.  Pt ambulatory from waiting area to exam room without  problem.  Abdominal: Soft. Bowel sounds are normal. There is no tenderness. There is no guarding.  Musculoskeletal: Normal range of motion. He exhibits edema.       Right lower leg: He exhibits edema.       Left lower leg: He exhibits edema.  2+ edema bilat. Neg Homan's sign.  Lymphadenopathy:       Head (right side): No submandibular adenopathy present.       Head (left side): No submandibular adenopathy present.    He has no cervical adenopathy.  Neurological: He is alert and oriented to person, place, and time. He has normal strength. No cranial nerve deficit or sensory deficit.  Skin: Skin is warm and dry.  Psychiatric: He has a normal mood and affect. His speech is normal.  Nursing note and vitals reviewed.    ED Treatments / Results  Labs (all labs ordered are listed, but only abnormal results are displayed) Labs Reviewed - No data to display  EKG None  Radiology No results found.  Procedures Procedures (including critical care time)  Medications Ordered in ED Medications - No data to display   Initial Impression / Assessment and Plan / ED Course  I have reviewed the triage vital signs and the nursing notes.  Pertinent labs & imaging results that were available during my care of the patient were reviewed by me and considered in my medical decision making (see chart for details).       Final Clinical Impressions(s) / ED Diagnoses MDM  Vital signs reviewed.  Pulse oximetry is 98% on room air.  Within normal limits by my interpretation.  The chest x-ray shows mild bronchitic-smoking-related changes, but no pneumonia and no congestive heart failure.  I discussed the findings with the patient in terms of which he understands.  I have asked him to follow-up with Dr. Margot Chimes for additional evaluation and management.  Albuterol inhaler and doxycycline given to the patient by prescription.  Patient also given a prescription for promethazine DM for his cough.  Patient is  advised to see his primary physician or return to the emergency department if any changes in his condition, problems, or concerns.   Final diagnoses:  Acute bronchitis, unspecified organism    ED Discharge Orders        Ordered    promethazine-dextromethorphan (PROMETHAZINE-DM) 6.25-15 MG/5ML syrup  4 times daily PRN,   Status:  Discontinued     03/23/18 1811    doxycycline (VIBRAMYCIN) 100 MG capsule  2 times daily,   Status:  Discontinued     03/23/18 1811    doxycycline (VIBRAMYCIN) 100 MG capsule  2 times daily     03/23/18 1821    promethazine-dextromethorphan (PROMETHAZINE-DM) 6.25-15 MG/5ML syrup  4 times daily PRN     03/23/18 1821       Ivery Quale, PA-C 03/24/18 0051  Derwood KaplanNanavati, Ankit, MD 03/27/18 1539

## 2018-03-23 NOTE — ED Notes (Signed)
Respiratory paged at this time for tx.  

## 2018-03-23 NOTE — Discharge Instructions (Signed)
Your examination and your x-ray suggest bronchitis.  Your oxygen level is 97% on room air.  Please use 2 puffs of albuterol every 4 hours for cough and for assistance with your breathing.  Please use doxycycline 2 times daily with food.  Use promethazine cough medication every 6 hours as needed for cough.  This medication may cause drowsiness, please use it with caution.  Please see Dr. Margot ChimesHeadden for follow-up of your cough and also for follow-up of the swelling in your legs as soon as possible.  Return to the emergency department if any emergent changes in your condition, problems, or concerns.

## 2018-03-23 NOTE — ED Triage Notes (Signed)
Pt reports cough X4 days with chills. No productive cough or fever. Has used OTC medications without relief.

## 2018-04-11 ENCOUNTER — Encounter (HOSPITAL_COMMUNITY): Payer: Self-pay

## 2018-04-11 ENCOUNTER — Emergency Department (HOSPITAL_COMMUNITY): Payer: Medicaid Other

## 2018-04-11 ENCOUNTER — Emergency Department (HOSPITAL_COMMUNITY)
Admission: EM | Admit: 2018-04-11 | Discharge: 2018-04-11 | Disposition: A | Discharge status: Home / Self Care | Payer: Medicaid Other | Attending: Emergency Medicine | Admitting: Emergency Medicine

## 2018-04-11 ENCOUNTER — Other Ambulatory Visit: Payer: Self-pay

## 2018-04-11 DIAGNOSIS — I110 Hypertensive heart disease with heart failure: Secondary | ICD-10-CM | POA: Insufficient documentation

## 2018-04-11 DIAGNOSIS — F17210 Nicotine dependence, cigarettes, uncomplicated: Secondary | ICD-10-CM | POA: Diagnosis not present

## 2018-04-11 DIAGNOSIS — R51 Headache: Secondary | ICD-10-CM | POA: Diagnosis not present

## 2018-04-11 DIAGNOSIS — Z7982 Long term (current) use of aspirin: Secondary | ICD-10-CM | POA: Insufficient documentation

## 2018-04-11 DIAGNOSIS — I509 Heart failure, unspecified: Secondary | ICD-10-CM | POA: Diagnosis not present

## 2018-04-11 DIAGNOSIS — R05 Cough: Secondary | ICD-10-CM | POA: Insufficient documentation

## 2018-04-11 DIAGNOSIS — J3489 Other specified disorders of nose and nasal sinuses: Secondary | ICD-10-CM | POA: Insufficient documentation

## 2018-04-11 DIAGNOSIS — Z8673 Personal history of transient ischemic attack (TIA), and cerebral infarction without residual deficits: Secondary | ICD-10-CM | POA: Insufficient documentation

## 2018-04-11 DIAGNOSIS — Z79899 Other long term (current) drug therapy: Secondary | ICD-10-CM | POA: Diagnosis not present

## 2018-04-11 DIAGNOSIS — R059 Cough, unspecified: Secondary | ICD-10-CM

## 2018-04-11 LAB — I-STAT CHEM 8, ED
BUN: 9 mg/dL (ref 6–20)
CREATININE: 0.8 mg/dL (ref 0.61–1.24)
Calcium, Ion: 1.14 mmol/L — ABNORMAL LOW (ref 1.15–1.40)
Chloride: 102 mmol/L (ref 101–111)
GLUCOSE: 101 mg/dL — AB (ref 65–99)
HCT: 40 % (ref 39.0–52.0)
HEMOGLOBIN: 13.6 g/dL (ref 13.0–17.0)
POTASSIUM: 4.2 mmol/L (ref 3.5–5.1)
Sodium: 136 mmol/L (ref 135–145)
TCO2: 23 mmol/L (ref 22–32)

## 2018-04-11 MED ORDER — FUROSEMIDE 20 MG PO TABS: 20.0000 mg | ORAL_TABLET | Freq: Every day | ORAL | 0 refills | Status: DC

## 2018-04-11 MED ORDER — FUROSEMIDE 40 MG PO TABS
20.0000 mg | ORAL_TABLET | Freq: Once | ORAL | Status: AC
  Administered 2018-04-11: 20 mg via ORAL
  Filled 2018-04-11: qty 1

## 2018-04-11 MED ORDER — POTASSIUM CHLORIDE ER 10 MEQ PO TBCR: 10.0000 meq | EXTENDED_RELEASE_TABLET | Freq: Every day | ORAL | 0 refills | Status: DC

## 2018-04-11 MED ORDER — DOXYCYCLINE HYCLATE 100 MG PO CAPS: 100.0000 mg | ORAL_CAPSULE | Freq: Two times a day (BID) | ORAL | 0 refills | Status: DC

## 2018-04-11 NOTE — Discharge Instructions (Addendum)
Stop smoking.  Take the water pills and antibiotics as prescribed.  Follow-up with your doctor next week. You should followup with your doctor or the cardiologist for an echocardiogram. Return to the ED if you develop chest pain, shortness of breath or any other concerns.

## 2018-04-11 NOTE — ED Triage Notes (Signed)
Pt states been coughing for 2 weeks. Without relief. abx and cough med didn't help.

## 2018-04-11 NOTE — ED Provider Notes (Signed)
[facility] EMERGENCY DEPARTMENT Provider Note   CSN: [id number] Arrival date & time: 04/11/18  0555     History   Chief Complaint Chief Complaint  Patient presents with  . Cough    HPI Gary Harrington is a 57 y.o. male.  Patient reports 2 weeks of cough that is nonproductive.  He was seen and treated for bronchitis on April 2 and states he got better but became worse again when his wife open the windows at home.  He is been taking Robitussin without relief.  Denies any sore throat or fever.  Denies any chest pain, shortness of breath, abdominal pain, nausea vomiting.  Patient is a smoker but denies any history of COPD or asthma.  Does not use inhalers on a regular basis.  He came in this morning because he is tired of coughing.  Denies any leg pain or leg swelling.  Denies any chest pain. Also complains of diffuse headache for the past 1 week that was gradual onset and worse with coughing.  Denies thunderclap onset.  The history is provided by the patient.  Cough  Associated symptoms include rhinorrhea. Pertinent negatives include no headaches, no sore throat and no myalgias.    Past Medical History:  Diagnosis Date  . Chronic pain   . Dizziness   . Headache   . Hypertension   . Stroke [facility])     There are no active problems to display for this patient.   Past Surgical History:  Procedure Laterality Date  . TONSILLECTOMY          Home Medications    Prior to Admission medications   Medication Sig Start Date End Date Taking? Authorizing Provider  albuterol (PROVENTIL HFA;VENTOLIN HFA) 108 (90 Base) MCG/ACT inhaler Inhale 2 puffs into the lungs every 4 (four) hours as needed for wheezing or shortness of breath (or coughing). 05/05/16   Dione Booze, MD  alprazolam Prudy Feeler) 2 MG tablet Take 2 mg by mouth 2 (two) times daily.    [provider]  aspirin 81 MG chewable tablet Chew 81 mg by mouth every other day.    [provider]    Aspirin-Acetaminophen-Caffeine (GOODY HEADACHE PO) Take 1-2 packets by mouth every 4 (four) hours.     [provider]  doxycycline (VIBRAMYCIN) 100 MG capsule Take 1 capsule (100 mg total) by mouth 2 (two) times daily. 03/23/18   Ivery Quale, PA-C  lisinopril-hydrochlorothiazide (PRINZIDE,ZESTORETIC) 10-12.5 MG tablet Take 1 tablet by mouth daily. 02/21/18   [provider]  promethazine-dextromethorphan (PROMETHAZINE-DM) 6.25-15 MG/5ML syrup Take 5 mLs by mouth 4 (four) times daily as needed. 03/23/18   Ivery Quale, PA-C    Family History Family History  Problem Relation Age of Onset  . Stroke Father   . Heart failure Father     Social History Social History   Tobacco Use  . Smoking status: Current Every Day Smoker    Packs/day: 2.00    Years: 27.00    Pack years: 54.00    Types: Cigarettes  . Smokeless tobacco: Never Used  Substance Use Topics  . Alcohol use: No  . Drug use: No     Allergies   Codeine; Penicillins; and Prednisone   Review of Systems Review of Systems  Constitutional: Negative for activity change, appetite change and fever.  HENT: Positive for congestion and rhinorrhea. Negative for dental problem and sore throat.   Eyes: Negative for visual disturbance.  Respiratory: Positive for cough.   Gastrointestinal: Negative  for abdominal pain, nausea and vomiting.  Genitourinary: Negative for dysuria and hematuria.  Musculoskeletal: Negative for arthralgias and myalgias.  Neurological: Negative for dizziness, weakness and headaches.   all other systems are negative except as noted in the HPI and PMH.     Physical Exam Updated Vital Signs BP 109/75 (BP Location: Left Arm)   Pulse 86   Temp 98.3 F (36.8 C) (Oral)   Resp 20   Ht 5\' 9"  (1.753 m)   Wt 95.7 kg (211 lb)   SpO2 97%   BMI 31.16 kg/m   Physical Exam  Constitutional: He is oriented to person, place, and time. He appears well-developed and well-nourished. No distress.   Speaking in full sentences  HENT:  Head: Normocephalic and atraumatic.  Mouth/Throat: Oropharynx is clear and moist. No oropharyngeal exudate.  Eyes: Pupils are equal, round, and reactive to light. Conjunctivae and EOM are normal.  Neck: Normal range of motion. Neck supple.  No meningismus.  Cardiovascular: Normal rate, regular rhythm, normal heart sounds and intact distal pulses.  No murmur heard. Pulmonary/Chest: Effort normal and breath sounds normal. No respiratory distress. He has no wheezes. He has no rales.  Basilar crackles.  Abdominal: Soft. There is no tenderness. There is no rebound and no guarding.  Musculoskeletal: Normal range of motion. He exhibits edema. He exhibits no tenderness.  Trace pretibial edema bilaterally  Neurological: He is alert and oriented to person, place, and time. No cranial nerve deficit. He exhibits normal muscle tone. Coordination normal.  No ataxia on finger to nose bilaterally. No pronator drift. 5/5 strength throughout. CN 2-12 intact.Equal grip strength. Sensation intact.   Skin: Skin is warm.  Psychiatric: He has a normal mood and affect. His behavior is normal.  Nursing note and vitals reviewed.    ED Treatments / Results  Labs (all labs ordered are listed, but only abnormal results are displayed) Labs Reviewed  I-STAT CHEM 8, ED - Abnormal; Notable for the following components:      Result Value   Glucose, Bld 101 (*)    Calcium, Ion 1.14 (*)    All other components within normal limits    EKG None  Radiology Dg Chest 2 View  Result Date: 04/11/2018 CLINICAL DATA:  Subacute onset of nonproductive cough and chest soreness. EXAM: CHEST - 2 VIEW COMPARISON:  Chest radiograph performed 03/23/2018 FINDINGS: The lungs are well-aerated. Mild vascular congestion is noted. Chronically increased interstitial markings are again noted. There is no evidence of focal opacification, pleural effusion or pneumothorax. The heart is normal in size;  the mediastinal contour is within normal limits. No acute osseous abnormalities are seen. Chronic left-sided rib deformities are noted. IMPRESSION: Mild vascular congestion. Chronically increased interstitial markings again noted. Electronically Signed   By: Roanna Raider M.D.   On: 04/11/2018 07:03    Procedures Procedures (including critical care time)  Medications Ordered in ED Medications - No data to display   Initial Impression / Assessment and Plan / ED Course  I have reviewed the triage vital signs and the nursing notes.  Pertinent labs & imaging results that were available during my care of the patient were reviewed by me and considered in my medical decision making (see chart for details).    2 weeks of cough, 1 week of gradual onset headache. No thunderclap onset. No neuro deficits.   No wheezing but some crackles on exam. Minimal LE edema.  Xray with some vascular congestion. Will give trial of lasix. Does  take low dose HCTZ at home. Add potassium.  No hypoxia or increased work of breathing. Advised to stop smoking. Followup with PCP for recheck. Continue lasix, antibiotics, bronchodilators. Return precautions discussed. Final Clinical Impressions(s) / ED Diagnoses   Final diagnoses:  Cough  Congestive heart failure, unspecified HF chronicity, unspecified heart failure type [facility](HCC)    ED Discharge Orders    None       Glynn Octaveancour, Okie Bogacz, MD 04/11/18 [id number]

## 2018-09-19 ENCOUNTER — Encounter (HOSPITAL_COMMUNITY): Payer: Self-pay | Admitting: *Deleted

## 2018-09-19 ENCOUNTER — Other Ambulatory Visit: Payer: Self-pay

## 2018-09-19 ENCOUNTER — Emergency Department (HOSPITAL_COMMUNITY)
Admission: EM | Admit: 2018-09-19 | Discharge: 2018-09-19 | Disposition: A | Discharge status: Home / Self Care | Payer: Medicaid Other | Attending: Emergency Medicine | Admitting: Emergency Medicine

## 2018-09-19 DIAGNOSIS — R05 Cough: Secondary | ICD-10-CM | POA: Diagnosis present

## 2018-09-19 DIAGNOSIS — Z8673 Personal history of transient ischemic attack (TIA), and cerebral infarction without residual deficits: Secondary | ICD-10-CM | POA: Diagnosis not present

## 2018-09-19 DIAGNOSIS — I10 Essential (primary) hypertension: Secondary | ICD-10-CM | POA: Diagnosis not present

## 2018-09-19 DIAGNOSIS — Z79899 Other long term (current) drug therapy: Secondary | ICD-10-CM | POA: Diagnosis not present

## 2018-09-19 DIAGNOSIS — Z7982 Long term (current) use of aspirin: Secondary | ICD-10-CM | POA: Insufficient documentation

## 2018-09-19 DIAGNOSIS — J40 Bronchitis, not specified as acute or chronic: Secondary | ICD-10-CM | POA: Diagnosis not present

## 2018-09-19 DIAGNOSIS — F17210 Nicotine dependence, cigarettes, uncomplicated: Secondary | ICD-10-CM | POA: Diagnosis not present

## 2018-09-19 MED ORDER — ALBUTEROL SULFATE HFA 108 (90 BASE) MCG/ACT IN AERS
2.0000 | INHALATION_SPRAY | RESPIRATORY_TRACT | Status: DC
  Administered 2018-09-19: 2 via RESPIRATORY_TRACT
  Filled 2018-09-19: qty 6.7

## 2018-09-19 MED ORDER — AZITHROMYCIN 250 MG PO TABS
500.0000 mg | ORAL_TABLET | Freq: Once | ORAL | Status: AC
  Administered 2018-09-19: 500 mg via ORAL
  Filled 2018-09-19: qty 2

## 2018-09-19 MED ORDER — AZITHROMYCIN 250 MG PO TABS: ORAL_TABLET | ORAL | 0 refills | Status: DC

## 2018-09-19 NOTE — ED Provider Notes (Signed)
[facility] EMERGENCY DEPARTMENT Provider Note   CSN: [id number] Arrival date & time: 09/19/18  4098     History   Chief Complaint Chief Complaint  Patient presents with  . Cough    HPI BAUER AUSBORN is a 57 y.o. male.  Coughing, wheezing for 2 weeks.  No fever, sweats, chills, rusty sputum.  Smoker.  Patient has used over-the-counter products with minimal success.  He is ambulatory.  Severity is mild to moderate.  Nothing makes symptoms better or worse     Past Medical History:  Diagnosis Date  . Chronic pain   . Dizziness   . Headache   . Hypertension   . Stroke [facility])     There are no active problems to display for this patient.   Past Surgical History:  Procedure Laterality Date  . TONSILLECTOMY          Home Medications    Prior to Admission medications   Medication Sig Start Date End Date Taking? Authorizing Provider  alprazolam Prudy Feeler) 2 MG tablet Take 2 mg by mouth 2 (two) times daily.   Yes [provider]  aspirin 81 MG chewable tablet Chew 81 mg by mouth every other day.   Yes [provider]  Aspirin-Acetaminophen-Caffeine (GOODY HEADACHE PO) Take 1-2 packets by mouth every 4 (four) hours.    Yes [provider]  furosemide (LASIX) 20 MG tablet Take 1 tablet (20 mg total) by mouth daily. 04/11/18  Yes Rancour, Jeannett Senior, MD  lisinopril-hydrochlorothiazide (PRINZIDE,ZESTORETIC) 10-12.5 MG tablet Take 1 tablet by mouth daily. 02/21/18  Yes [provider]  potassium chloride (K-DUR) 10 MEQ tablet Take 1 tablet (10 mEq total) by mouth daily. 04/11/18  Yes Rancour, Jeannett Senior, MD  albuterol (PROVENTIL HFA;VENTOLIN HFA) 108 (90 Base) MCG/ACT inhaler Inhale 2 puffs into the lungs every 4 (four) hours as needed for wheezing or shortness of breath (or coughing). 05/05/16   Dione Booze, MD  azithromycin [facility]) 250 MG tablet 1 tablet daily for 4 days starting Monday morning 09/19/18   Donnetta Hutching, MD  doxycycline (VIBRAMYCIN)  100 MG capsule Take 1 capsule (100 mg total) by mouth 2 (two) times daily. 04/11/18   Rancour, Jeannett Senior, MD  promethazine-dextromethorphan (PROMETHAZINE-DM) 6.25-15 MG/5ML syrup Take 5 mLs by mouth 4 (four) times daily as needed. 03/23/18   Ivery Quale, PA-C    Family History Family History  Problem Relation Age of Onset  . Stroke Father   . Heart failure Father     Social History Social History   Tobacco Use  . Smoking status: Current Every Day Smoker    Packs/day: 2.00    Years: 27.00    Pack years: 54.00    Types: Cigarettes  . Smokeless tobacco: Never Used  Substance Use Topics  . Alcohol use: No  . Drug use: No     Allergies   Codeine; Penicillins; and Prednisone   Review of Systems Review of Systems  All other systems reviewed and are negative.    Physical Exam Updated Vital Signs BP 117/74   Pulse 72   Temp 97.8 F (36.6 C) (Oral)   Resp 17   Ht 5\' 10"  (1.778 m)   Wt 95.7 kg   SpO2 97%   BMI 30.27 kg/m   Physical Exam  Constitutional: He is oriented to person, place, and time. He appears well-developed and well-nourished.  nad  HENT:  Head: Normocephalic and atraumatic.  Eyes: Conjunctivae are normal.  Neck: Neck supple.  Cardiovascular: Normal  rate and regular rhythm.  Pulmonary/Chest: Effort normal. He has wheezes.  Abdominal: Soft. Bowel sounds are normal.  Musculoskeletal: Normal range of motion.  Neurological: He is alert and oriented to person, place, and time.  Skin: Skin is warm and dry.  Psychiatric: He has a normal mood and affect. His behavior is normal.  Nursing note and vitals reviewed.    ED Treatments / Results  Labs (all labs ordered are listed, but only abnormal results are displayed) Labs Reviewed - No data to display  EKG None  Radiology No results found.  Procedures Procedures (including critical care time)  Medications Ordered in ED Medications  albuterol (PROVENTIL HFA;VENTOLIN HFA) 108 (90 Base) MCG/ACT  inhaler 2 puff (2 puffs Inhalation Given 09/19/18 0804)  azithromycin (ZITHROMAX) tablet 500 mg (500 mg Oral Given 09/19/18 0803)     Initial Impression / Assessment and Plan / ED Course  I have reviewed the triage vital signs and the nursing notes.  Pertinent labs & imaging results that were available during my care of the patient were reviewed by me and considered in my medical decision making (see chart for details).     2-week history of cough and wheezing.  Could be mycoplasma.  Rx Zithromax and albuterol inhaler.  Patient is allergic to prednisone.  Final Clinical Impressions(s) / ED Diagnoses   Final diagnoses:  Bronchitis    ED Discharge Orders         Ordered    azithromycin (ZITHROMAX) 250 MG tablet     09/19/18 0820           Donnetta Hutching, MD 09/19/18 [phone]

## 2018-09-19 NOTE — Discharge Instructions (Addendum)
Prescription for antibiotic and inhaler.  Continue to use over-the-counter Robitussin.  Stop smoking.

## 2018-09-19 NOTE — ED Triage Notes (Signed)
Pt c/o nonproductive cough and sinus congestion for the past two weeks, has used 3 bottles of cough medication with no relief.

## 2018-10-18 IMAGING — DX DG ABDOMEN 1V
2 series · 2 of 2 positions shown · non-contrast
Comparison: None.

CLINICAL DATA: Ingestion of foreign body.

EXAM:
ABDOMEN - 1 VIEW

[abdomen kub (1 of 2)]
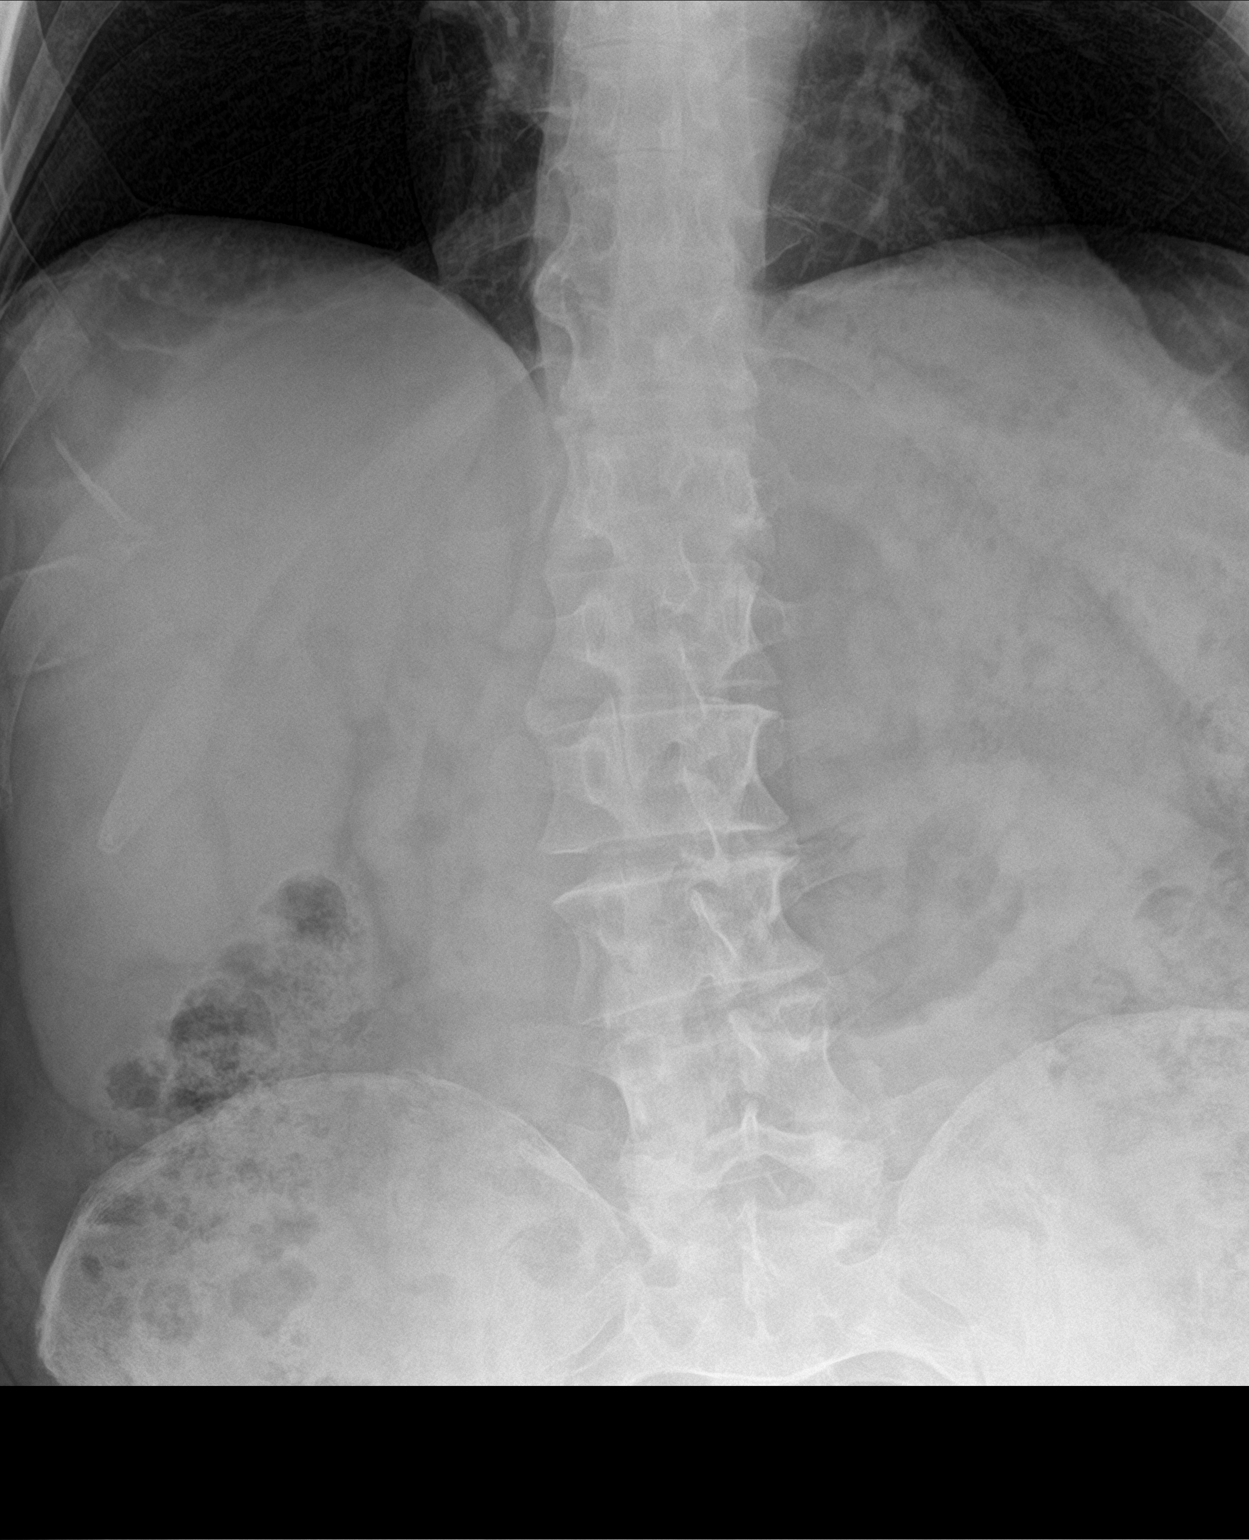

[abdomen kub (2 of 2)]
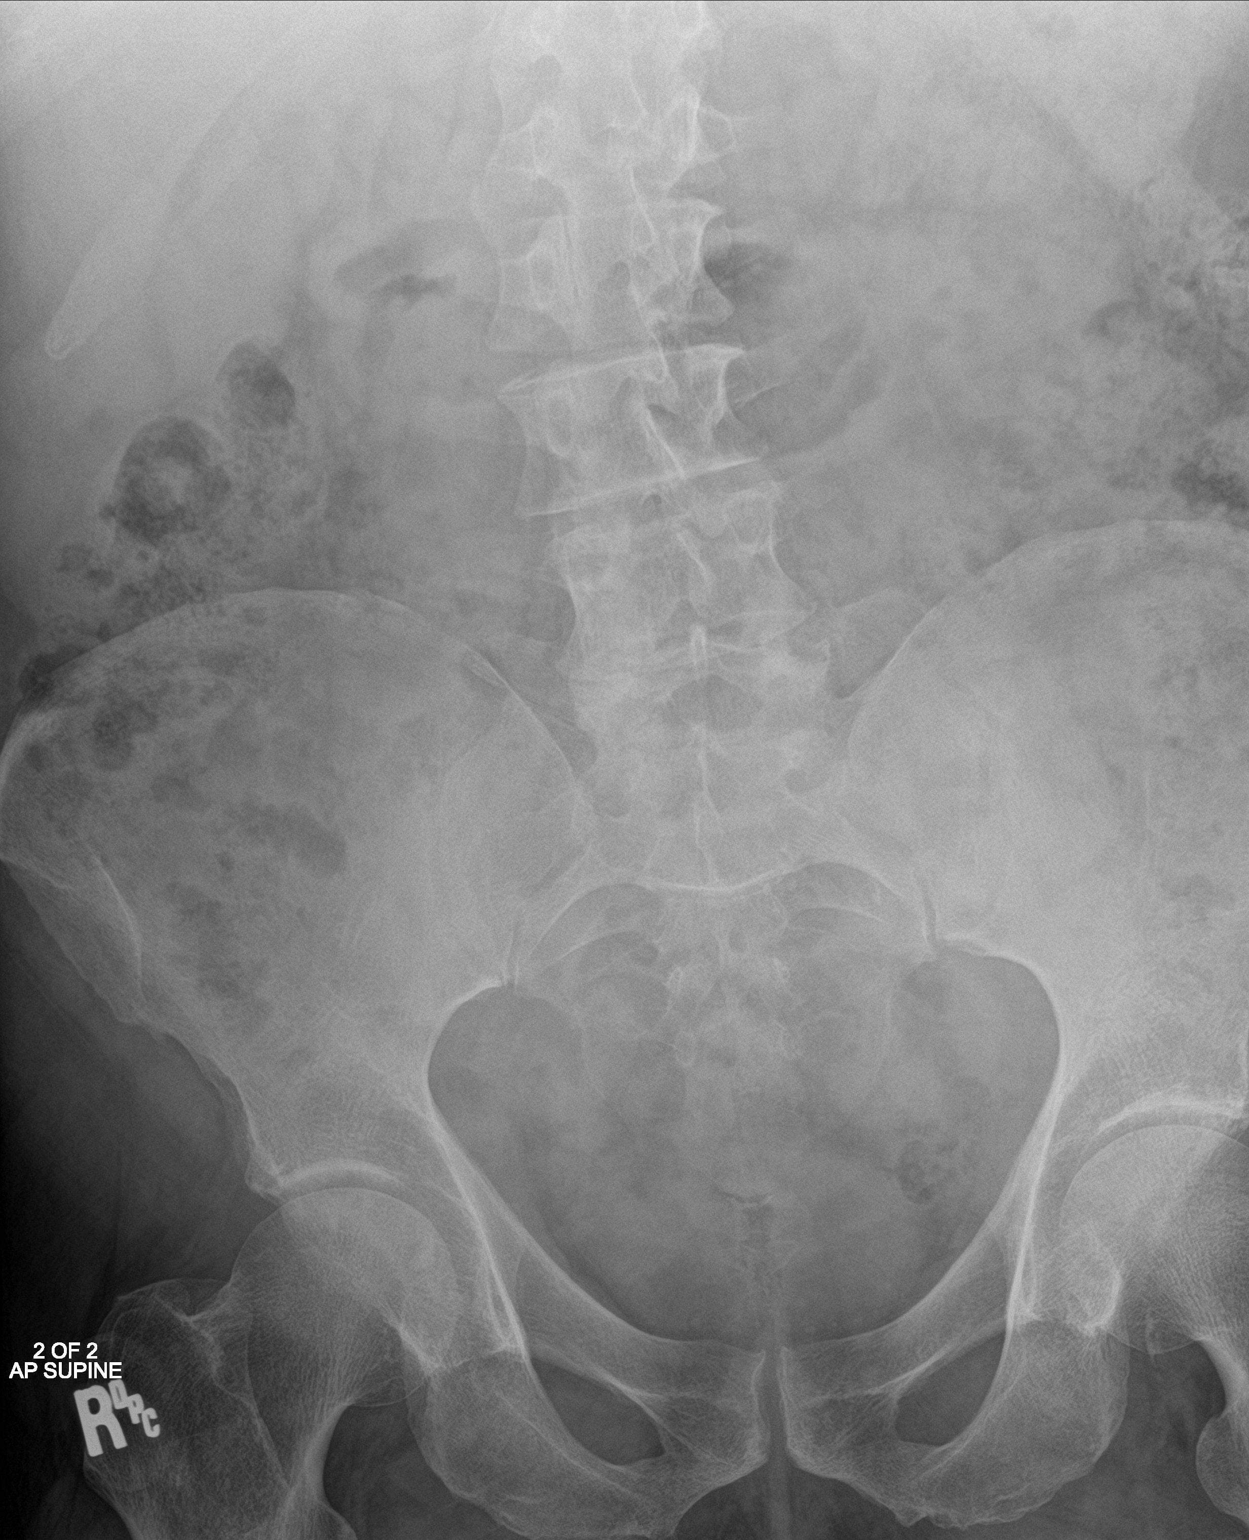

[2 of 2 positions shown; findings below may reference images not displayed]

FINDINGS: No foreign body identified. No free air, portal venous gas, or
pneumatosis. No acute abnormalities.
IMPRESSION: Negative.

## 2018-10-27 IMAGING — DX DG TIBIA/FIBULA 2V*L*
4 series · 4 of 4 positions shown · non-contrast
Comparison: None.

CLINICAL DATA: Initial evaluation for left lower leg pain for 2
days.

EXAM:
LEFT TIBIA AND FIBULA - 2 VIEW

[tibia ap (1 of 2)]
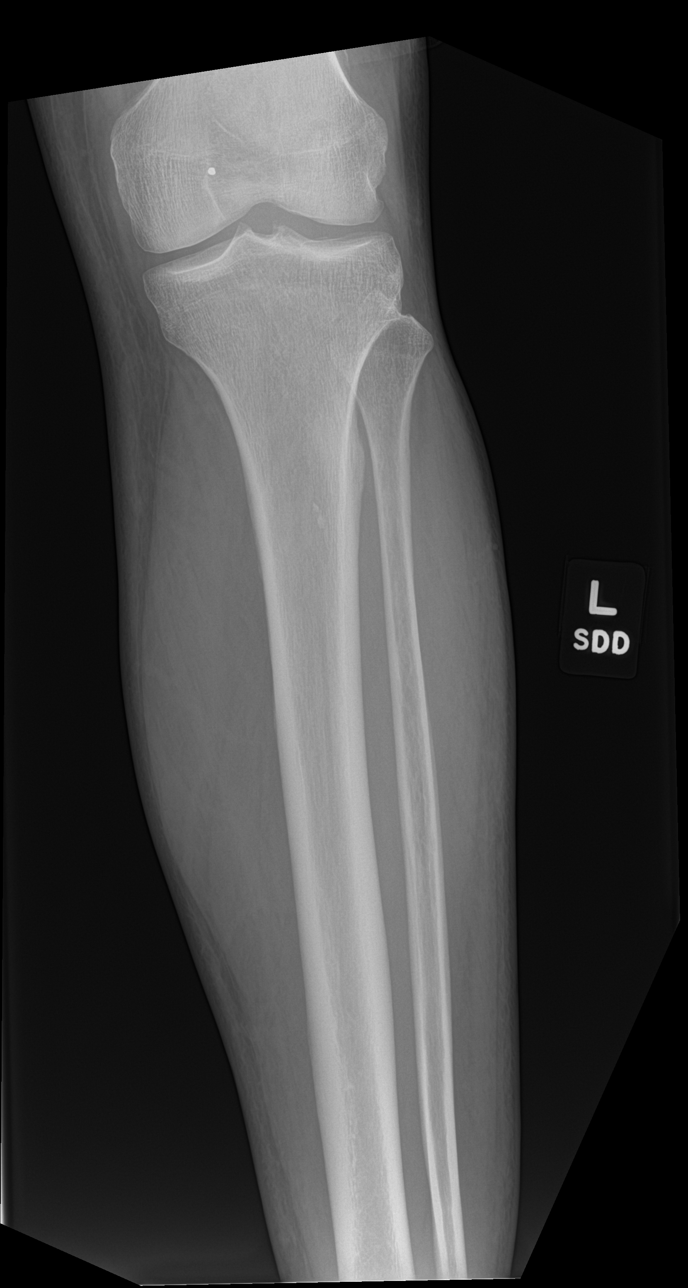

[tibia ap (2 of 2)]
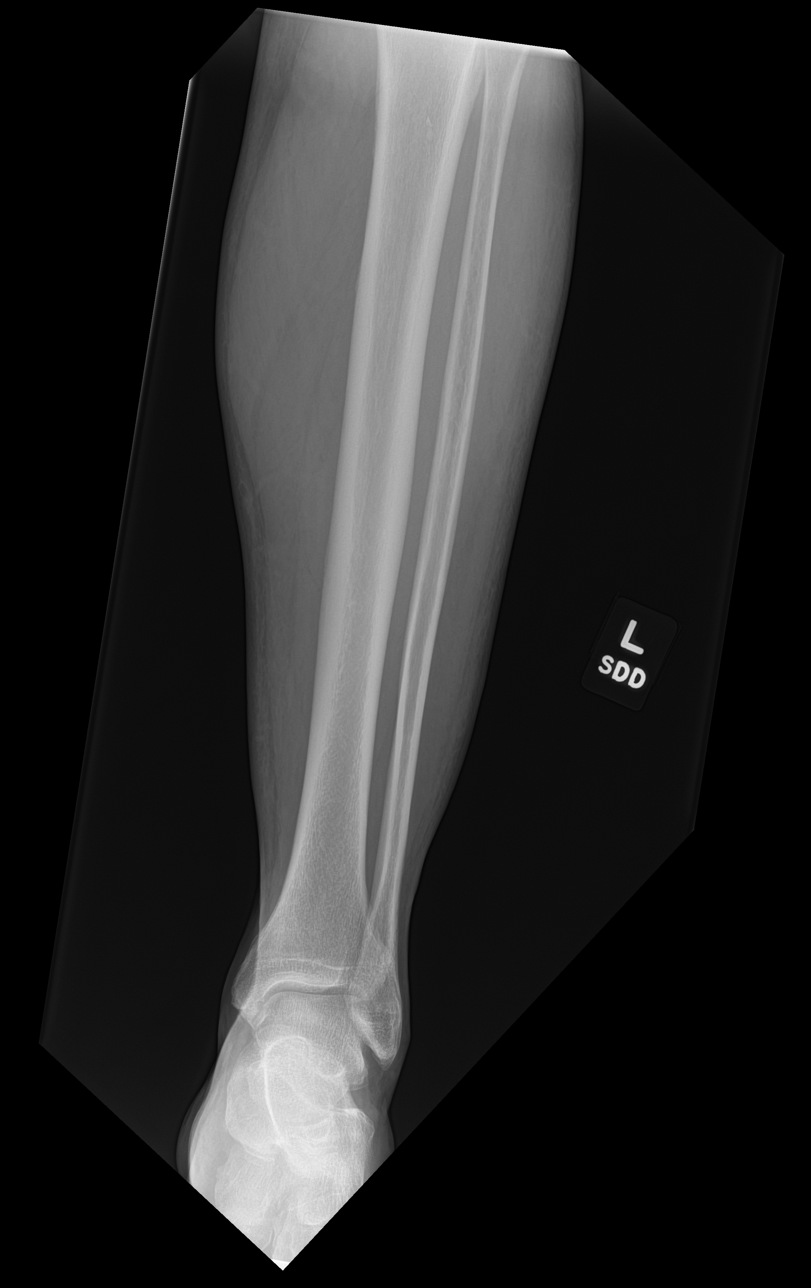

[tibia lat (1 of 2)]
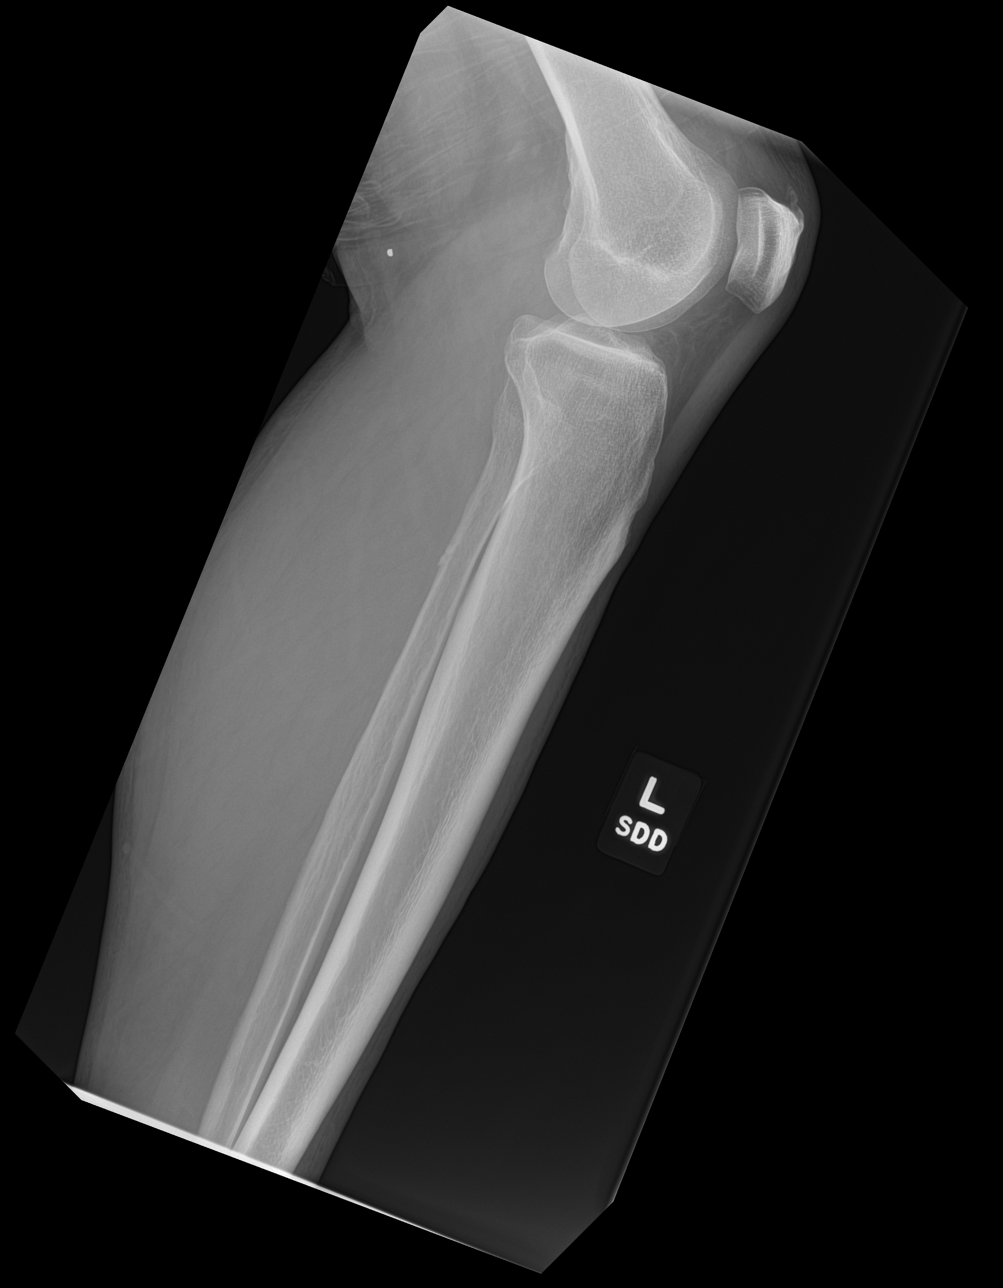

[tibia lat (2 of 2)]
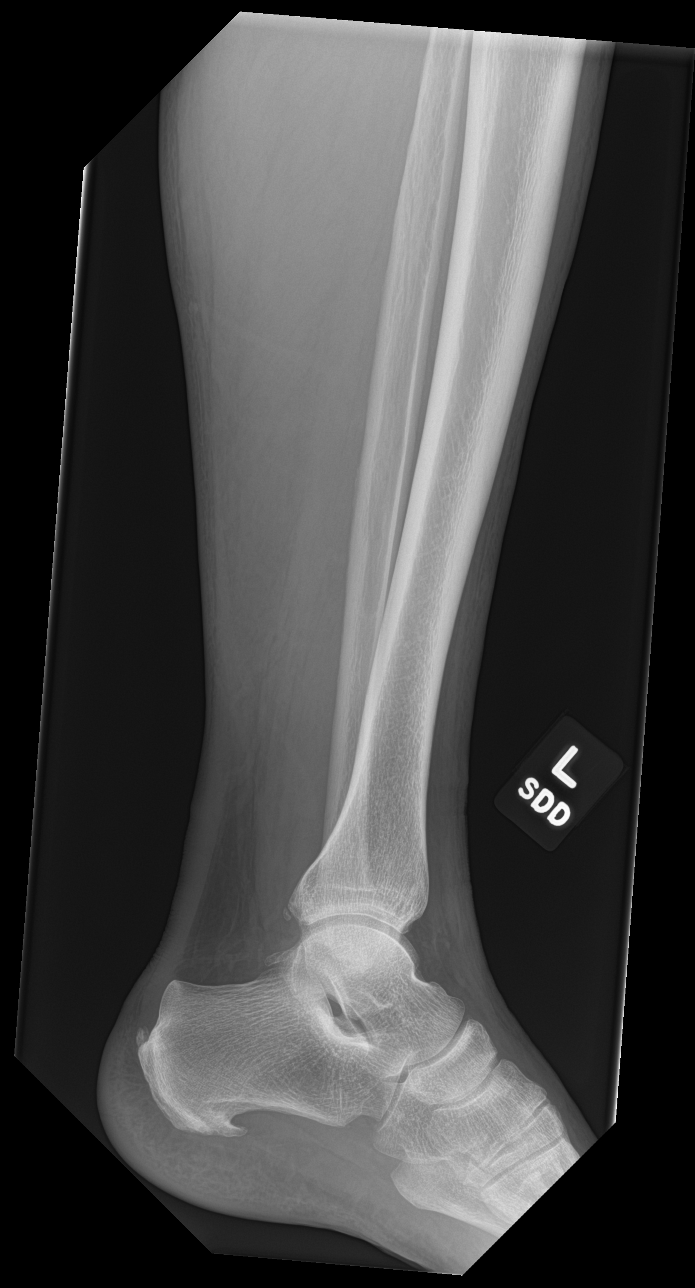

[4 of 4 positions shown; findings below may reference images not displayed]

FINDINGS: There is no evidence of fracture or other focal bone lesions. Soft
tissues demonstrate no acute abnormality. Retained metallic BB
overlies the popliteal fossa.
IMPRESSION: 1. No acute abnormality about the left tibia/fibula.
2. Subcentimeter retained metallic foreign body within the soft
tissues posterior to the left knee.

## 2019-01-23 ENCOUNTER — Encounter (HOSPITAL_COMMUNITY): Payer: Self-pay | Admitting: *Deleted

## 2019-01-23 ENCOUNTER — Emergency Department (HOSPITAL_COMMUNITY)
Admission: EM | Admit: 2019-01-23 | Discharge: 2019-01-23 | Disposition: A | Discharge status: Home / Self Care | Payer: Medicaid Other | Attending: Emergency Medicine | Admitting: Emergency Medicine

## 2019-01-23 ENCOUNTER — Other Ambulatory Visit: Payer: Self-pay

## 2019-01-23 DIAGNOSIS — I10 Essential (primary) hypertension: Secondary | ICD-10-CM | POA: Insufficient documentation

## 2019-01-23 DIAGNOSIS — Z79899 Other long term (current) drug therapy: Secondary | ICD-10-CM | POA: Diagnosis not present

## 2019-01-23 DIAGNOSIS — Z7982 Long term (current) use of aspirin: Secondary | ICD-10-CM | POA: Insufficient documentation

## 2019-01-23 DIAGNOSIS — F17210 Nicotine dependence, cigarettes, uncomplicated: Secondary | ICD-10-CM | POA: Diagnosis not present

## 2019-01-23 DIAGNOSIS — R05 Cough: Secondary | ICD-10-CM | POA: Diagnosis present

## 2019-01-23 DIAGNOSIS — J441 Chronic obstructive pulmonary disease with (acute) exacerbation: Secondary | ICD-10-CM | POA: Diagnosis not present

## 2019-01-23 MED ORDER — BENZONATATE 100 MG PO CAPS
200.0000 mg | ORAL_CAPSULE | Freq: Once | ORAL | Status: AC
  Administered 2019-01-23: 200 mg via ORAL
  Filled 2019-01-23: qty 2

## 2019-01-23 MED ORDER — DEXAMETHASONE 4 MG PO TABS
12.0000 mg | ORAL_TABLET | Freq: Once | ORAL | Status: DC
  Filled 2019-01-23: qty 3

## 2019-01-23 MED ORDER — DOXYCYCLINE HYCLATE 100 MG PO CAPS: 100.0000 mg | ORAL_CAPSULE | Freq: Two times a day (BID) | ORAL | 0 refills | Status: DC

## 2019-01-23 MED ORDER — ALBUTEROL SULFATE (2.5 MG/3ML) 0.083% IN NEBU
5.0000 mg | INHALATION_SOLUTION | RESPIRATORY_TRACT | Status: AC
  Administered 2019-01-23 (×3): 5 mg via RESPIRATORY_TRACT
  Filled 2019-01-23 (×3): qty 6

## 2019-01-23 MED ORDER — ALBUTEROL SULFATE HFA 108 (90 BASE) MCG/ACT IN AERS
1.0000 | INHALATION_SPRAY | Freq: Four times a day (QID) | RESPIRATORY_TRACT | 0 refills | Status: DC | PRN
Start: 2019-01-23 — End: 2022-10-10

## 2019-01-23 MED ORDER — BENZONATATE 100 MG PO CAPS: 100.0000 mg | ORAL_CAPSULE | Freq: Three times a day (TID) | ORAL | 0 refills | Status: DC

## 2019-01-23 NOTE — ED Triage Notes (Signed)
Pt c/o dry non productive cough for the past two weeks, denies any fever, n/v/d.,

## 2019-01-23 NOTE — ED Provider Notes (Signed)
[facility] EMERGENCY DEPARTMENT Provider Note   CSN: [id number] Arrival date & time: 01/23/19  0441     History   Chief Complaint Chief Complaint  Patient presents with  . Cough    HPI Gary Harrington is a 58 y.o. male.  HPI 58 year old male with history of hypertension, stroke comes in with chief complaint of cough.  Patient also has chronic bronchitis and smokes a pack a day.  He states that he started having this cough about 2 weeks ago.  With this cough there is no phlegm and he denies any nausea, vomiting, fevers, chills, URI-like symptoms.  Patient does not think he has been wheezing.  He is out of his nebulizer treatment has not taken any.  Over-the-counter medicine has not helped him.  Past Medical History:  Diagnosis Date  . Chronic pain   . Dizziness   . Headache   . Hypertension   . Stroke [facility])     There are no active problems to display for this patient.   Past Surgical History:  Procedure Laterality Date  . TONSILLECTOMY          Home Medications    Prior to Admission medications   Medication Sig Start Date End Date Taking? Authorizing Provider  albuterol (PROVENTIL HFA;VENTOLIN HFA) 108 (90 Base) MCG/ACT inhaler Inhale 1-2 puffs into the lungs every 6 (six) hours as needed for wheezing or shortness of breath. 01/23/19   Derwood Kaplan, MD  alprazolam Prudy Feeler) 2 MG tablet Take 2 mg by mouth 2 (two) times daily.    [provider]  aspirin 81 MG chewable tablet Chew 81 mg by mouth every other day.    [provider]  Aspirin-Acetaminophen-Caffeine (GOODY HEADACHE PO) Take 1-2 packets by mouth every 4 (four) hours.     [provider]  azithromycin (ZITHROMAX) 250 MG tablet 1 tablet daily for 4 days starting Monday morning 09/19/18   Donnetta Hutching, MD  benzonatate (TESSALON) 100 MG capsule Take 1 capsule (100 mg total) by mouth every 8 (eight) hours. 01/23/19   Derwood Kaplan, MD  doxycycline (VIBRAMYCIN) 100 MG capsule Take 1  capsule (100 mg total) by mouth 2 (two) times daily. 01/23/19   Derwood Kaplan, MD  furosemide (LASIX) 20 MG tablet Take 1 tablet (20 mg total) by mouth daily. 04/11/18   Rancour, Jeannett Senior, MD  lisinopril-hydrochlorothiazide (PRINZIDE,ZESTORETIC) 10-12.5 MG tablet Take 1 tablet by mouth daily. 02/21/18   [provider]  potassium chloride (K-DUR) 10 MEQ tablet Take 1 tablet (10 mEq total) by mouth daily. 04/11/18   Rancour, Jeannett Senior, MD  promethazine-dextromethorphan (PROMETHAZINE-DM) 6.25-15 MG/5ML syrup Take 5 mLs by mouth 4 (four) times daily as needed. 03/23/18   Ivery Quale, PA-C    Family History Family History  Problem Relation Age of Onset  . Stroke Father   . Heart failure Father     Social History Social History   Tobacco Use  . Smoking status: Current Every Day Smoker    Packs/day: 2.00    Years: 27.00    Pack years: 54.00    Types: Cigarettes  . Smokeless tobacco: Never Used  Substance Use Topics  . Alcohol use: No  . Drug use: No     Allergies   Codeine; Penicillins; and Prednisone   Review of Systems Review of Systems  Constitutional: Negative for fever.  Respiratory: Positive for cough. Negative for shortness of breath.   Cardiovascular: Positive for chest pain.  Allergic/Immunologic: Negative for immunocompromised state.  All  other systems reviewed and are negative.    Physical Exam Updated Vital Signs BP 125/83   Pulse 61   Temp 97.7 F (36.5 C) (Oral)   Resp 16   Ht 5\' 9"  (1.753 m)   Wt 97.5 kg   SpO2 96%   BMI 31.75 kg/m   Physical Exam Vitals signs and nursing note reviewed.  Constitutional:      Appearance: He is well-developed.  HENT:     Head: Atraumatic.  Neck:     Musculoskeletal: Neck supple.  Cardiovascular:     Rate and Rhythm: Normal rate.  Pulmonary:     Effort: Pulmonary effort is normal. No respiratory distress.     Breath sounds: No wheezing.  Skin:    General: Skin is warm.  Neurological:     Mental Status:  He is alert and oriented to person, place, and time.      ED Treatments / Results  Labs (all labs ordered are listed, but only abnormal results are displayed) Labs Reviewed - No data to display  EKG None  Radiology No results found.  Procedures Procedures (including critical care time)  Medications Ordered in ED Medications  dexamethasone (DECADRON) tablet 12 mg (has no administration in time range)  albuterol (PROVENTIL) (2.5 MG/3ML) 0.083% nebulizer solution 5 mg (5 mg Nebulization Given 01/23/19 0545)  benzonatate (TESSALON) capsule 200 mg (200 mg Oral Given 01/23/19 0517)     Initial Impression / Assessment and Plan / ED Course  I have reviewed the triage vital signs and the nursing notes.  Pertinent labs & imaging results that were available during my care of the patient were reviewed by me and considered in my medical decision making (see chart for details).  Clinical Course as of Jan 23 717  Wynelle Link Jan 23, 2019  0737 Patient reassessed after breathing treatment and and he feels a lot better.  Stable for discharge.   [AN]    Clinical Course User Index [AN] Derwood Kaplan, MD    58 year old male with history of chronic bronchitis comes in with chief complaint of cough.  He states he has been having cough for the last several days.  On exam there is no wheezing.  Clinical concerns are for possible bronchospasm.  Patient is a smoker and this is a new cough for him.  We will give him doxycycline along with albuterol.  Final Clinical Impressions(s) / ED Diagnoses   Final diagnoses:  COPD with acute exacerbation [facility])    ED Discharge Orders         Ordered    albuterol (PROVENTIL HFA;VENTOLIN HFA) 108 (90 Base) MCG/ACT inhaler  Every 6 hours PRN     01/23/19 0703    doxycycline (VIBRAMYCIN) 100 MG capsule  2 times daily     01/23/19 0703    benzonatate (TESSALON) 100 MG capsule  Every 8 hours     01/23/19 0705           Derwood Kaplan, MD 01/23/19 [phone]

## 2019-01-23 NOTE — ED Notes (Signed)
Pt updated on  Plan of care,

## 2019-01-23 NOTE — Discharge Instructions (Addendum)
Take the medications provided for control of your symptoms.

## 2019-01-26 ENCOUNTER — Emergency Department (HOSPITAL_COMMUNITY)
Admission: EM | Admit: 2019-01-26 | Discharge: 2019-01-26 | Disposition: A | Discharge status: Home / Self Care | Payer: Medicaid Other | Attending: Emergency Medicine | Admitting: Emergency Medicine

## 2019-01-26 ENCOUNTER — Encounter (HOSPITAL_COMMUNITY): Payer: Self-pay | Admitting: *Deleted

## 2019-01-26 DIAGNOSIS — Z79899 Other long term (current) drug therapy: Secondary | ICD-10-CM | POA: Insufficient documentation

## 2019-01-26 DIAGNOSIS — F17210 Nicotine dependence, cigarettes, uncomplicated: Secondary | ICD-10-CM | POA: Insufficient documentation

## 2019-01-26 DIAGNOSIS — I10 Essential (primary) hypertension: Secondary | ICD-10-CM | POA: Insufficient documentation

## 2019-01-26 DIAGNOSIS — R040 Epistaxis: Secondary | ICD-10-CM | POA: Insufficient documentation

## 2019-01-26 DIAGNOSIS — Z7982 Long term (current) use of aspirin: Secondary | ICD-10-CM | POA: Diagnosis not present

## 2019-01-26 LAB — BASIC METABOLIC PANEL
ANION GAP: 7 (ref 5–15)
BUN: 17 mg/dL (ref 6–20)
CALCIUM: 8.8 mg/dL — AB (ref 8.9–10.3)
CO2: 22 mmol/L (ref 22–32)
Chloride: 105 mmol/L (ref 98–111)
Creatinine, Ser: 0.92 mg/dL (ref 0.61–1.24)
GFR calc non Af Amer: >60 mL/min (ref >60)
Glucose, Bld: 91 mg/dL (ref 70–99)
Potassium: 3.8 mmol/L (ref 3.5–5.1)
SODIUM: 134 mmol/L — AB (ref 135–145)

## 2019-01-26 LAB — CBC
HCT: 43.9 % (ref 39.0–52.0)
Hemoglobin: 13 g/dL (ref 13.0–17.0)
MCH: 19.6 pg — ABNORMAL LOW (ref 26.0–34.0)
MCHC: 29.6 g/dL — ABNORMAL LOW (ref 30.0–36.0)
MCV: 66.3 fL — ABNORMAL LOW (ref 80.0–100.0)
NRBC: 0 % (ref 0.0–0.2)
PLATELETS: 264 10*3/uL (ref 150–400)
RBC: 6.62 MIL/uL — AB (ref 4.22–5.81)
RDW: 17.6 % — ABNORMAL HIGH (ref 11.5–15.5)
WBC: 9.6 10*3/uL (ref 4.0–10.5)

## 2019-01-26 MED ORDER — OXYMETAZOLINE HCL 0.05 % NA SOLN
NASAL | Status: AC
  Administered 2019-01-26: 13:00:00
  Filled 2019-01-26: qty 30

## 2019-01-26 NOTE — ED Notes (Signed)
Called back to triage to re assess.  Pt nose is oozing small amount of blood.  Pt sent back to waiting room.  After speaking with pa, called pt back to triage to spray nostril with afrin.  Called to triage, pt not in waiting room.  Per other people in waiting room pt went outside.

## 2019-01-26 NOTE — ED Provider Notes (Addendum)
[facility] EMERGENCY DEPARTMENT Provider Note   CSN: [id number] Arrival date & time: 01/26/19  1102     History   Chief Complaint Chief Complaint  Patient presents with  . Epistaxis    HPI Gary Harrington is a 58 y.o. male.  Patient with onset of bleeding from the left nares while changing a truck tire shortly prior to arrival.  In the waiting room bleeding had stopped and it restarted.  Patient was sprayed with Afrin.  Bleeding has not reoccurred.  I got back to the bed no bleeding.  Patient states about a year ago had bleeding from the left side of his nose just as today.  Did not follow-up with ear nose and throat.  Patient is not on any blood thinners.  Patient is on a care plan care plan follow-up.     Past Medical History:  Diagnosis Date  . Chronic pain   . Dizziness   . Headache   . Hypertension   . Stroke [facility])     There are no active problems to display for this patient.   Past Surgical History:  Procedure Laterality Date  . TONSILLECTOMY          Home Medications    Prior to Admission medications   Medication Sig Start Date End Date Taking? Authorizing Provider  albuterol (PROVENTIL HFA;VENTOLIN HFA) 108 (90 Base) MCG/ACT inhaler Inhale 1-2 puffs into the lungs every 6 (six) hours as needed for wheezing or shortness of breath. 01/23/19  Yes Derwood Kaplan, MD  alprazolam Prudy Feeler) 2 MG tablet Take 2 mg by mouth 2 (two) times daily.   Yes [provider]  aspirin 81 MG chewable tablet Chew 81 mg by mouth every other day.   Yes [provider]  Aspirin-Acetaminophen-Caffeine (GOODY HEADACHE PO) Take 1-2 packets by mouth every 4 (four) hours.    Yes [provider]  azithromycin (ZITHROMAX) 250 MG tablet 1 tablet daily for 4 days starting Monday morning 09/19/18  Yes Donnetta Hutching, MD  benzonatate (TESSALON) 100 MG capsule Take 1 capsule (100 mg total) by mouth every 8 (eight) hours. 01/23/19  Yes Derwood Kaplan, MD  doxycycline  (VIBRAMYCIN) 100 MG capsule Take 1 capsule (100 mg total) by mouth 2 (two) times daily. 01/23/19  Yes Derwood Kaplan, MD  furosemide (LASIX) 20 MG tablet Take 1 tablet (20 mg total) by mouth daily. 04/11/18  Yes Rancour, Jeannett Senior, MD  lisinopril-hydrochlorothiazide (PRINZIDE,ZESTORETIC) 10-12.5 MG tablet Take 1 tablet by mouth daily. 02/21/18  Yes [provider]  potassium chloride (K-DUR) 10 MEQ tablet Take 1 tablet (10 mEq total) by mouth daily. 04/11/18  Yes Rancour, Jeannett Senior, MD    Family History Family History  Problem Relation Age of Onset  . Stroke Father   . Heart failure Father     Social History Social History   Tobacco Use  . Smoking status: Current Every Day Smoker    Packs/day: 2.00    Years: 27.00    Pack years: 54.00    Types: Cigarettes  . Smokeless tobacco: Never Used  Substance Use Topics  . Alcohol use: No  . Drug use: No     Allergies   Codeine; Other; Penicillins; and Prednisone   Review of Systems Review of Systems  Constitutional: Negative for chills and fever.  HENT: Positive for nosebleeds. Negative for congestion, rhinorrhea and sore throat.   Eyes: Negative for visual disturbance.  Respiratory: Negative for cough and shortness of breath.   Cardiovascular: Negative for  chest pain and leg swelling.  Gastrointestinal: Negative for abdominal pain, diarrhea, nausea and vomiting.  Genitourinary: Negative for dysuria.  Musculoskeletal: Negative for back pain and neck pain.  Skin: Negative for rash.  Neurological: Negative for dizziness, syncope, light-headedness and headaches.  Hematological: Does not bruise/bleed easily.  Psychiatric/Behavioral: Negative for confusion.     Physical Exam Updated Vital Signs BP 119/90 (BP Location: Right Arm)   Pulse 79   Temp 97.7 F (36.5 C) (Oral)   Resp 16   Ht 1.753 m (5\' 9" )   Wt 99.8 kg   SpO2 99%   BMI 32.49 kg/m   Physical Exam Vitals signs and nursing note reviewed.  Constitutional:       General: He is not in acute distress.    Appearance: He is well-developed.  HENT:     Head: Normocephalic and atraumatic.     Nose:     Comments: Left nares with a small lesion maybe measuring about 1 to 2 mm that may have been the source for bleeding.  No active bleeding currently.  Right nares normal.    Mouth/Throat:     Mouth: Mucous membranes are moist.  Eyes:     Conjunctiva/sclera: Conjunctivae normal.  Neck:     Musculoskeletal: Normal range of motion and neck supple.  Cardiovascular:     Rate and Rhythm: Normal rate and regular rhythm.     Heart sounds: Normal heart sounds. No murmur.  Pulmonary:     Effort: Pulmonary effort is normal. No respiratory distress.     Breath sounds: Normal breath sounds.  Abdominal:     General: Bowel sounds are normal.     Palpations: Abdomen is soft.     Tenderness: There is no abdominal tenderness.  Musculoskeletal: Normal range of motion.  Skin:    General: Skin is warm and dry.     Capillary Refill: Capillary refill takes less than 2 seconds.  Neurological:     General: No focal deficit present.     Mental Status: He is alert and oriented to person, place, and time.      ED Treatments / Results  Labs (all labs ordered are listed, but only abnormal results are displayed) Labs Reviewed  BASIC METABOLIC PANEL - Abnormal; Notable for the following components:      Result Value   Sodium 134 (*)    Calcium 8.8 (*)    All other components within normal limits  CBC - Abnormal; Notable for the following components:   RBC 6.62 (*)    MCV 66.3 (*)    MCH 19.6 (*)    MCHC 29.6 (*)    RDW 17.6 (*)    All other components within normal limits    EKG None  Radiology No results found.  Procedures Procedures (including critical care time)  Medications Ordered in ED Medications  oxymetazoline (AFRIN) 0.05 % nasal spray (  Given 01/26/19 1323)     Initial Impression / Assessment and Plan / ED Course  I have reviewed the triage  vital signs and the nursing notes.  Pertinent labs & imaging results that were available during my care of the patient were reviewed by me and considered in my medical decision making (see chart for details).     Bleeding stopped spontaneously with pinching of the nose.  And some Afrin use while still in the waiting room.  Patient's labs without any significant abnormalities.  Recommending follow-up with ear nose and throat.  We will have  patient use the Afrin spray prophylactically for 24 hours.  Given instructions what to do if bleeding reoccurs.  Final Clinical Impressions(s) / ED Diagnoses   Final diagnoses:  Left-sided epistaxis    ED Discharge Orders    None       Vanetta MuldersZackowski, Sarenity Ramaker, MD 01/26/19 1702    Vanetta MuldersZackowski, Teriann Livingood, MD 01/26/19 [phone]

## 2019-01-26 NOTE — ED Triage Notes (Signed)
Pt with nosebleed for last 15 min.  Bleeding has stopped at present.

## 2019-01-26 NOTE — Discharge Instructions (Addendum)
Use the Afrin every 6 hours over the next 24 hours to help prevent further bleeding.  If bleeding does recur pinch nose for 15 minutes by clock.  If that does not stop it then blow your nose and get all the clots out.  Then sprayed with Afrin.  Then pinch again for another 15 minutes.  Make an appointment to follow-up with ear nose and throat.  Today's labs without any significant abnormalities.  Work note provided.  Looking

## 2019-01-27 ENCOUNTER — Other Ambulatory Visit: Payer: Self-pay

## 2019-01-27 ENCOUNTER — Emergency Department (HOSPITAL_COMMUNITY)
Admission: EM | Admit: 2019-01-27 | Discharge: 2019-01-27 | Disposition: A | Discharge status: Home / Self Care | Payer: Medicaid Other | Attending: Emergency Medicine | Admitting: Emergency Medicine

## 2019-01-27 ENCOUNTER — Encounter (HOSPITAL_COMMUNITY): Payer: Self-pay | Admitting: *Deleted

## 2019-01-27 DIAGNOSIS — Z8673 Personal history of transient ischemic attack (TIA), and cerebral infarction without residual deficits: Secondary | ICD-10-CM | POA: Diagnosis not present

## 2019-01-27 DIAGNOSIS — Z79899 Other long term (current) drug therapy: Secondary | ICD-10-CM | POA: Diagnosis not present

## 2019-01-27 DIAGNOSIS — R040 Epistaxis: Secondary | ICD-10-CM | POA: Diagnosis present

## 2019-01-27 DIAGNOSIS — F17210 Nicotine dependence, cigarettes, uncomplicated: Secondary | ICD-10-CM | POA: Insufficient documentation

## 2019-01-27 DIAGNOSIS — I10 Essential (primary) hypertension: Secondary | ICD-10-CM | POA: Insufficient documentation

## 2019-01-27 MED ORDER — OXYMETAZOLINE HCL 0.05 % NA SOLN
2.0000 | Freq: Once | NASAL | Status: AC
  Administered 2019-01-27: 2 via NASAL

## 2019-01-27 MED ORDER — OXYMETAZOLINE HCL 0.05 % NA SOLN
NASAL | Status: AC
  Administered 2019-01-27: 2 via NASAL
  Filled 2019-01-27: qty 30

## 2019-01-27 NOTE — ED Notes (Signed)
Pt's nose slightly bleeding. Minimal blood noted on washcloth. Dr. Clarene Duke notified prior to discharge and came to bedside to assess pt.

## 2019-01-27 NOTE — ED Triage Notes (Signed)
Pt c/o constant nosebleed since midnight last night. Pt reports he was seen here yesterday at [facility] with nosebleed and after he was given Afrin the nosebleed stopped. Pt reports he went to bed at 2030 and his nose wasn't bleeding but his wife woke him up at midnight because his shirt was covered in blood. Pt denies use of blood thinners.

## 2019-01-27 NOTE — ED Provider Notes (Signed)
[facility] EMERGENCY DEPARTMENT Provider Note   CSN: [id number] Arrival date & time: 01/27/19  0932     History   Chief Complaint Chief Complaint  Patient presents with  . Epistaxis    HPI Gary Harrington is a 58 y.o. male.  HPI Pt was seen at 0945. Per pt, c/o unknown onset and persistence of constant left sided nosebleed that he noticed approximately midnight. Pt states his wife woke him up in the middle of the night because his shirt had blood on it. Pt states he held pressure to his nostrils for "a minute or 2" before putting a tissue up his left nostril. Pt states the nosebleed "hasn't stopped." Pt was evaluated in the ED yesterday for this complaint, which resolved after the topical application of Afrin spray. Pt denies nose/facial injury, no other areas of bleeding, no easy bruising, no CP/SOB, no abd pain, no N/V/D.     Past Medical History:  Diagnosis Date  . Chronic pain   . Dizziness   . Headache   . Hypertension   . Stroke [facility])     There are no active problems to display for this patient.   Past Surgical History:  Procedure Laterality Date  . TONSILLECTOMY          Home Medications    Prior to Admission medications   Medication Sig Start Date End Date Taking? Authorizing Provider  albuterol (PROVENTIL HFA;VENTOLIN HFA) 108 (90 Base) MCG/ACT inhaler Inhale 1-2 puffs into the lungs every 6 (six) hours as needed for wheezing or shortness of breath. 01/23/19   Derwood Kaplan, MD  alprazolam Prudy Feeler) 2 MG tablet Take 2 mg by mouth 2 (two) times daily.    [provider]  aspirin 81 MG chewable tablet Chew 81 mg by mouth every other day.    [provider]  Aspirin-Acetaminophen-Caffeine (GOODY HEADACHE PO) Take 1-2 packets by mouth every 4 (four) hours.     [provider]  azithromycin (ZITHROMAX) 250 MG tablet 1 tablet daily for 4 days starting Monday morning 09/19/18   Donnetta Hutching, MD  benzonatate (TESSALON) 100 MG capsule Take  1 capsule (100 mg total) by mouth every 8 (eight) hours. 01/23/19   Derwood Kaplan, MD  doxycycline (VIBRAMYCIN) 100 MG capsule Take 1 capsule (100 mg total) by mouth 2 (two) times daily. 01/23/19   Derwood Kaplan, MD  furosemide (LASIX) 20 MG tablet Take 1 tablet (20 mg total) by mouth daily. 04/11/18   Rancour, Jeannett Senior, MD  lisinopril-hydrochlorothiazide (PRINZIDE,ZESTORETIC) 10-12.5 MG tablet Take 1 tablet by mouth daily. 02/21/18   [provider]  potassium chloride (K-DUR) 10 MEQ tablet Take 1 tablet (10 mEq total) by mouth daily. 04/11/18   Glynn Octave, MD    Family History Family History  Problem Relation Age of Onset  . Stroke Father   . Heart failure Father     Social History Social History   Tobacco Use  . Smoking status: Current Every Day Smoker    Packs/day: 2.00    Years: 27.00    Pack years: 54.00    Types: Cigarettes  . Smokeless tobacco: Never Used  Substance Use Topics  . Alcohol use: No  . Drug use: No     Allergies   Codeine; Other; Penicillins; and Prednisone   Review of Systems Review of Systems ROS: Statement: All systems negative except as marked or noted in the HPI; Constitutional: Negative for fever and chills. ; ; Eyes: Negative for eye pain, redness  and discharge. ; ; ENMT: Negative for ear pain, hoarseness, nasal congestion, sinus pressure and sore throat. +left nosebleed.; ; Cardiovascular: Negative for chest pain, palpitations, diaphoresis, dyspnea and peripheral edema. ; ; Respiratory: Negative for cough, wheezing and stridor. ; ; Gastrointestinal: Negative for nausea, vomiting, diarrhea, abdominal pain, blood in stool, hematemesis, jaundice and rectal bleeding. . ; ; Genitourinary: Negative for dysuria, flank pain and hematuria. ; ; Musculoskeletal: Negative for back pain and neck pain. Negative for swelling and trauma.; ; Skin: Negative for pruritus, rash, abrasions, blisters, bruising and skin lesion.; ; Neuro: Negative for headache,  lightheadedness and neck stiffness. Negative for weakness, altered level of consciousness, altered mental status, extremity weakness, paresthesias, involuntary movement, seizure and syncope.       Physical Exam Updated Vital Signs BP 109/67   Pulse 68   Temp 97.6 F (36.4 C) (Oral)   Resp 15   Ht 5\' 9"  (1.753 m)   Wt 95.3 kg   SpO2 93%   BMI 31.01 kg/m   Physical Exam 0950: Physical examination:  Nursing notes reviewed; Vital signs and O2 SAT reviewed;  Constitutional: Well developed, Well nourished, Well hydrated, In no acute distress; Head:  Normocephalic, atraumatic; Eyes: EOMI, PERRL, No scleral icterus; ENMT:  +edemetous nasal turbinates bilat with clear rhinorrhea on right, +friable anterior nasal mucosa on left without active epistaxis, no clots. No blood in posterior pharynx.  Mouth and pharynx normal, Mucous membranes moist; Neck: Supple, Full range of motion, No lymphadenopathy; Cardiovascular: Regular rate and rhythm, No gallop; Respiratory: Breath sounds clear & equal bilaterally, No wheezes.  Speaking full sentences with ease, Normal respiratory effort/excursion; Chest: Nontender, Movement normal; Abdomen: Soft, Nontender, Nondistended, Normal bowel sounds; Genitourinary: No CVA tenderness; Extremities: Peripheral pulses normal, No tenderness, No edema, No calf edema or asymmetry.; Neuro: AA&Ox3, Major CN grossly intact.  Speech clear. No gross focal motor or sensory deficits in extremities.; Skin: Color normal, Warm, Dry.   ED Treatments / Results  Labs (all labs ordered are listed, but only abnormal results are displayed)   EKG None  Radiology   Procedures Procedures (including critical care time)  Medications Ordered in ED Medications  oxymetazoline (AFRIN) 0.05 % nasal spray 2 spray (2 sprays Left Nare Given 01/27/19 0957)     Initial Impression / Assessment and Plan / ED Course  I have reviewed the triage vital signs and the nursing notes.  Pertinent labs  & imaging results that were available during my care of the patient were reviewed by me and considered in my medical decision making (see chart for details).  MDM Reviewed: previous chart, nursing note and vitals    1110:  Tissue removed from pt's left nares: minimal blood on tissue. No active epistaxis. Afrin spray applied. No bleeding while in the ED. Pt educated regarding home nosebleed treatment by myself and ED RN; verb understanding. Dx d/w pt and family.  Questions answered.  Verb understanding, agreeable to d/c home with outpt f/u.  1145:  I have been called to the pt's bedside multiple times for "nosebleed." Pt is fussing and wiping his nose near continually. There is no active bleeding, his anterior nasal mucosa on the left is friable. I applied Afrin spray and placed a clothespin for pressure on his nares; explained reasoning and to keep on for 15-20 minutes. According to ED staff, pt removed this shortly after I walked out of the room, complaining that "wasn't the treatment for a nosebleed" and he was "bleeding in his head."  ED RN and I attempted multiple times to explain nosebleed, including source and treatment, pt just kept shaking his head. Pt checked again multiple times, no active nosebleed. Pt continually keeps sticking a washcloth/finger up his nose and/or forcefully wiping nose. Again, ED RN and I explained reasoning why not to do this. Pt continued to shake his head "no." Pt walked out of the ED with steady gait, easy resps, NAD and no further epistaxis.        Final Clinical Impressions(s) / ED Diagnoses   Final diagnoses:  None    ED Discharge Orders    None       Bartley JesterMcManus, Thurmon Mizell, DO 01/31/19 1303

## 2019-01-27 NOTE — ED Notes (Signed)
Rhino kit placed at bedside  

## 2019-01-27 NOTE — Discharge Instructions (Signed)
Take your usual prescriptions as previously directed.  Use over the counter normal saline nasal spray, as directed on packaging, at least twice a day for the next week.  If you have a nosebleed:  Blow your nose gently, then lean your head forward and pinch both of your nostrils firmly and continuously for at least 15 to 20 minutes.  Call your regular medical doctor today to schedule a follow up appointment within the next 4 days.  Return to the Emergency Department immediately sooner if worsening.

## 2019-02-28 ENCOUNTER — Emergency Department (HOSPITAL_COMMUNITY)
Admission: EM | Admit: 2019-02-28 | Discharge: 2019-02-28 | Disposition: A | Discharge status: Home / Self Care | Payer: Medicaid Other | Attending: Emergency Medicine | Admitting: Emergency Medicine

## 2019-02-28 ENCOUNTER — Encounter (HOSPITAL_COMMUNITY): Payer: Self-pay | Admitting: Emergency Medicine

## 2019-02-28 ENCOUNTER — Other Ambulatory Visit: Payer: Self-pay

## 2019-02-28 DIAGNOSIS — Z8673 Personal history of transient ischemic attack (TIA), and cerebral infarction without residual deficits: Secondary | ICD-10-CM | POA: Diagnosis not present

## 2019-02-28 DIAGNOSIS — R102 Pelvic and perineal pain: Secondary | ICD-10-CM | POA: Insufficient documentation

## 2019-02-28 DIAGNOSIS — Z79899 Other long term (current) drug therapy: Secondary | ICD-10-CM | POA: Diagnosis not present

## 2019-02-28 DIAGNOSIS — F17210 Nicotine dependence, cigarettes, uncomplicated: Secondary | ICD-10-CM | POA: Insufficient documentation

## 2019-02-28 DIAGNOSIS — R1032 Left lower quadrant pain: Secondary | ICD-10-CM

## 2019-02-28 DIAGNOSIS — I10 Essential (primary) hypertension: Secondary | ICD-10-CM | POA: Diagnosis not present

## 2019-02-28 MED ORDER — ACETAMINOPHEN 500 MG PO TABS
1000.0000 mg | ORAL_TABLET | Freq: Once | ORAL | Status: AC
  Administered 2019-02-28: 1000 mg via ORAL
  Filled 2019-02-28: qty 2

## 2019-02-28 NOTE — ED Provider Notes (Signed)
[facility] EMERGENCY DEPARTMENT Provider Note   CSN: [id number] Arrival date & time: 02/28/19  6203    History   Chief Complaint Chief Complaint  Patient presents with  . Groin Pain    HPI Gary Harrington is a 58 y.o. male.     Patient presents with left groin pain started 2 weeks ago.  Worse with palpation and heavy lifting.  No history of significant hernia.  No fever chills or vomiting.  No abdominal pain.  No testicular pain or urinary symptoms.  Patient's had multiple visits to the ER.     Past Medical History:  Diagnosis Date  . Chronic pain   . Dizziness   . Headache   . Hypertension   . Stroke [facility])     There are no active problems to display for this patient.   Past Surgical History:  Procedure Laterality Date  . TONSILLECTOMY          Home Medications    Prior to Admission medications   Medication Sig Start Date End Date Taking? Authorizing Provider  albuterol (PROVENTIL HFA;VENTOLIN HFA) 108 (90 Base) MCG/ACT inhaler Inhale 1-2 puffs into the lungs every 6 (six) hours as needed for wheezing or shortness of breath. Patient taking differently: Inhale into the lungs daily as needed for wheezing or shortness of breath.  01/23/19  Yes Derwood Kaplan, MD  alprazolam Prudy Feeler) 2 MG tablet Take 2 mg by mouth 3 (three) times daily as needed.    Yes [provider]  furosemide (LASIX) 20 MG tablet Take 1 tablet (20 mg total) by mouth daily. 04/11/18  Yes Rancour, Jeannett Senior, MD  oxymetazoline (AFRIN) 0.05 % nasal spray Place 2 sprays into both nostrils 2 (two) times daily as needed for congestion.   Yes [provider]  potassium chloride (K-DUR) 10 MEQ tablet Take 1 tablet (10 mEq total) by mouth daily. 04/11/18  Yes Rancour, Jeannett Senior, MD    Family History Family History  Problem Relation Age of Onset  . Stroke Father   . Heart failure Father     Social History Social History   Tobacco Use  . Smoking status: Current Every Day Smoker   Packs/day: 1.00    Years: 27.00    Pack years: 27.00    Types: Cigarettes  . Smokeless tobacco: Never Used  Substance Use Topics  . Alcohol use: No  . Drug use: No     Allergies   Codeine; Other; Penicillins; and Prednisone   Review of Systems Review of Systems  Constitutional: Negative for chills and fever.  HENT: Negative for congestion.   Respiratory: Negative for shortness of breath.   Cardiovascular: Negative for chest pain.  Gastrointestinal: Negative for abdominal pain and vomiting.  Genitourinary: Negative for dysuria and flank pain.  Musculoskeletal: Negative for back pain, neck pain and neck stiffness.  Skin: Negative for rash.  Neurological: Negative for light-headedness and headaches.     Physical Exam Updated Vital Signs BP 103/64 (BP Location: Right Arm)   Pulse 73   Temp (!) 97.5 F (36.4 C) (Oral)   Resp 18   SpO2 100%   Physical Exam Vitals signs and nursing note reviewed.  Constitutional:      Appearance: He is well-developed.  HENT:     Head: Normocephalic and atraumatic.  Eyes:     General:        Right eye: No discharge.        Left eye: No discharge.     Conjunctiva/sclera:  Conjunctivae normal.  Neck:     Musculoskeletal: Normal range of motion.     Trachea: No tracheal deviation.  Cardiovascular:     Rate and Rhythm: Normal rate.  Pulmonary:     Effort: Pulmonary effort is normal.  Abdominal:     General: There is no distension.     Palpations: Abdomen is soft.     Tenderness: There is no abdominal tenderness. There is no guarding.     Comments: Patient has normal testicular exam nontender.  No swelling.  No hernia palpated.  Patient has focal tenderness to palpation medial left proximal thigh crease.  No evidence of infection externally.  Skin:    General: Skin is warm.     Findings: No rash.  Neurological:     Mental Status: He is alert and oriented to person, place, and time.      ED Treatments / Results  Labs (all labs  ordered are listed, but only abnormal results are displayed) Labs Reviewed - No data to display  EKG None  Radiology No results found.  Procedures Procedures (including critical care time)  Medications Ordered in ED Medications  acetaminophen (TYLENOL) tablet 1,000 mg (has no administration in time range)     Initial Impression / Assessment and Plan / ED Course  I have reviewed the triage vital signs and the nursing notes.  Pertinent labs & imaging results that were available during my care of the patient were reviewed by me and considered in my medical decision making (see chart for details).       Well-appearing patient presents for focal groin tenderness.  No signs of testicular involvement no significant hernia on exam.  Discussed likely musculoskeletal versus small hernia unable to palpate.  With no abdominal pain fevers or vomiting no indication for emergent imaging.  Discussed follow-up with primary doctor and surgeon as needed.  Patient requesting pain meds, Tylenol given.  Final Clinical Impressions(s) / ED Diagnoses   Final diagnoses:  Left groin pain    ED Discharge Orders    None       Blane Ohara, MD 02/28/19 [phone]

## 2019-02-28 NOTE — ED Notes (Signed)
Pain med given, Patient given discharge instruction, verbalized understand. Patient ambulatory out of the department.

## 2019-02-28 NOTE — Discharge Instructions (Signed)
Follow up with primary doctor and local surgeon for possible hernia. Return for vomiting, testicular pain, fevers, uncontrolled pain or new concerns.

## 2019-02-28 NOTE — ED Triage Notes (Signed)
Onset 2 weeks left sided groining pain, worse today, heavy lifting for work, does not remember injury, OTC pain med not helping

## 2019-03-05 ENCOUNTER — Emergency Department (HOSPITAL_COMMUNITY)
Admission: EM | Admit: 2019-03-05 | Discharge: 2019-03-05 | Disposition: A | Discharge status: Home / Self Care | Payer: Medicaid Other | Attending: Emergency Medicine | Admitting: Emergency Medicine

## 2019-03-05 ENCOUNTER — Encounter (HOSPITAL_COMMUNITY): Payer: Self-pay

## 2019-03-05 ENCOUNTER — Other Ambulatory Visit: Payer: Self-pay

## 2019-03-05 DIAGNOSIS — W228XXA Striking against or struck by other objects, initial encounter: Secondary | ICD-10-CM | POA: Diagnosis not present

## 2019-03-05 DIAGNOSIS — T148XXA Other injury of unspecified body region, initial encounter: Secondary | ICD-10-CM

## 2019-03-05 DIAGNOSIS — I10 Essential (primary) hypertension: Secondary | ICD-10-CM | POA: Insufficient documentation

## 2019-03-05 DIAGNOSIS — F17210 Nicotine dependence, cigarettes, uncomplicated: Secondary | ICD-10-CM | POA: Diagnosis not present

## 2019-03-05 DIAGNOSIS — Y939 Activity, unspecified: Secondary | ICD-10-CM | POA: Insufficient documentation

## 2019-03-05 DIAGNOSIS — Z760 Encounter for issue of repeat prescription: Secondary | ICD-10-CM

## 2019-03-05 DIAGNOSIS — Y999 Unspecified external cause status: Secondary | ICD-10-CM | POA: Insufficient documentation

## 2019-03-05 DIAGNOSIS — Y929 Unspecified place or not applicable: Secondary | ICD-10-CM | POA: Insufficient documentation

## 2019-03-05 DIAGNOSIS — Z79899 Other long term (current) drug therapy: Secondary | ICD-10-CM | POA: Insufficient documentation

## 2019-03-05 DIAGNOSIS — S0081XA Abrasion of other part of head, initial encounter: Secondary | ICD-10-CM | POA: Diagnosis present

## 2019-03-05 NOTE — ED Notes (Signed)
Pt left without dc instructions from RN.

## 2019-03-05 NOTE — ED Provider Notes (Signed)
[facility] EMERGENCY DEPARTMENT Provider Note   CSN: [id number] Arrival date & time: 03/05/19  2233    History   Chief Complaint Chief Complaint  Patient presents with  . Medication Refill    HPI Gary Harrington is a 58 y.o. male.     The history is provided by the patient.  Medication Refill  Patient presents for refill of his Xanax.  He reports his prescribing doctor lost his license.  He reports has not taken his Xanax for several days.  He also reports he was moving refrigerator over 2 days ago when he hit him in the head.  No LOC, he was not crushed by the fridge. He denies any headache or vomiting.   Past Medical History:  Diagnosis Date  . Chronic pain   . Dizziness   . Headache   . Hypertension   . Stroke [facility])     There are no active problems to display for this patient.   Past Surgical History:  Procedure Laterality Date  . TONSILLECTOMY          Home Medications    Prior to Admission medications   Medication Sig Start Date End Date Taking? Authorizing Provider  albuterol (PROVENTIL HFA;VENTOLIN HFA) 108 (90 Base) MCG/ACT inhaler Inhale 1-2 puffs into the lungs every 6 (six) hours as needed for wheezing or shortness of breath. Patient taking differently: Inhale into the lungs daily as needed for wheezing or shortness of breath.  01/23/19   Derwood Kaplan, MD  alprazolam Prudy Feeler) 2 MG tablet Take 2 mg by mouth 3 (three) times daily as needed.     [provider]  furosemide (LASIX) 20 MG tablet Take 1 tablet (20 mg total) by mouth daily. 04/11/18   Rancour, Jeannett Senior, MD  oxymetazoline (AFRIN) 0.05 % nasal spray Place 2 sprays into both nostrils 2 (two) times daily as needed for congestion.    [provider]  potassium chloride (K-DUR) 10 MEQ tablet Take 1 tablet (10 mEq total) by mouth daily. 04/11/18   Glynn Octave, MD    Family History Family History  Problem Relation Age of Onset  . Stroke Father   .  Heart failure Father     Social History Social History   Tobacco Use  . Smoking status: Current Every Day Smoker    Packs/day: 1.00    Years: 27.00    Pack years: 27.00    Types: Cigarettes  . Smokeless tobacco: Never Used  Substance Use Topics  . Alcohol use: No  . Drug use: No     Allergies   Codeine; Other; Penicillins; and Prednisone   Review of Systems Review of Systems  Constitutional: Negative for fever.  Eyes: Negative for visual disturbance.  Gastrointestinal: Negative for vomiting.  Skin: Positive for wound.  Neurological: Negative for headaches.     Physical Exam Updated Vital Signs BP (!) 144/88 (BP Location: Right Arm)   Pulse 76   Temp 97.8 F (36.6 C) (Oral)   Resp 16   Ht 1.753 m (5\' 9" )   Wt 104.3 kg   SpO2 100%   BMI 33.97 kg/m   Physical Exam CONSTITUTIONAL: Disheveled HEAD: Abrasion to left forehead, no other signs of trauma EYES: EOMI/PERRL ENMT: Mucous membranes moist NECK: supple no meningeal signs CV: S1/S2 noted, no murmurs/rubs/gallops noted LUNGS: Lungs are clear to auscultation bilaterally, no apparent distress ABDOMEN: soft, nontender NEURO: Pt is awake/alert/appropriate, moves all extremitiesx4.  No facial droop.  No tremor noted  GCS 15 EXTREMITIES:  full ROM SKIN: warm, color normal PSYCH: no abnormalities of mood noted, alert and oriented to situation   ED Treatments / Results  Labs (all labs ordered are listed, but only abnormal results are displayed) Labs Reviewed - No data to display  EKG None  Radiology No results found.  Procedures Procedures (including critical care time)  Medications Ordered in ED Medications - No data to display   Initial Impression / Assessment and Plan / ED Course  I have reviewed the triage vital signs and the nursing notes.      Advised patient that I would be unable to refill his Xanax. He is showing no signs of benzodiazepine withdrawal.  He reports a fridge hit him  in the head, he has a small abrasion but no lacerations.  No other signs of any head trauma.  Final Clinical Impressions(s) / ED Diagnoses   Final diagnoses:  Abrasion  Medication refill    ED Discharge Orders    None       Zadie Rhine, MD 03/05/19 [phone]

## 2019-03-05 NOTE — ED Triage Notes (Signed)
Pt presents with a bandage above his Left eye, states a refrigerator fell on him 2 days ago. Pt also requesting a refill for xanax as his PCP is no longer practicing. Pt denies headache, nausea or dizziness.

## 2019-04-12 ENCOUNTER — Encounter (HOSPITAL_COMMUNITY): Payer: Self-pay | Admitting: *Deleted

## 2019-04-12 ENCOUNTER — Other Ambulatory Visit: Payer: Self-pay

## 2019-04-12 ENCOUNTER — Emergency Department (HOSPITAL_COMMUNITY)
Admission: EM | Admit: 2019-04-12 | Discharge: 2019-04-12 | Disposition: A | Discharge status: Home / Self Care | Payer: Medicaid Other | Attending: Emergency Medicine | Admitting: Emergency Medicine

## 2019-04-12 DIAGNOSIS — F17210 Nicotine dependence, cigarettes, uncomplicated: Secondary | ICD-10-CM | POA: Insufficient documentation

## 2019-04-12 DIAGNOSIS — I10 Essential (primary) hypertension: Secondary | ICD-10-CM | POA: Diagnosis not present

## 2019-04-12 DIAGNOSIS — Z8673 Personal history of transient ischemic attack (TIA), and cerebral infarction without residual deficits: Secondary | ICD-10-CM | POA: Diagnosis not present

## 2019-04-12 DIAGNOSIS — F1320 Sedative, hypnotic or anxiolytic dependence, uncomplicated: Secondary | ICD-10-CM | POA: Diagnosis not present

## 2019-04-12 DIAGNOSIS — Z760 Encounter for issue of repeat prescription: Secondary | ICD-10-CM | POA: Diagnosis not present

## 2019-04-12 NOTE — Discharge Instructions (Signed)
You are seen in the ER for medication refill. At the moment there are no warning signs concerning for severe benzodiazepine withdrawal.  Please read the instructions provided on benzo withdrawal.  Return to the ER if your symptoms get severe.  Otherwise continue with the outpatient follow-up on May 1.

## 2019-04-12 NOTE — ED Triage Notes (Addendum)
Pt states that he has been out of his xanax for the past 3-4 days, was scheduled to see his pcp at the beginning of April but was not able to complete the appointment due to the coronvirus and appointments being rescheduled, pt does have appointment on Apr 22, 2019. Pt c/o diarrhea for the past three days along with intermittent chest pain

## 2019-04-12 NOTE — ED Notes (Signed)
ED Provider at bedside. 

## 2019-04-12 NOTE — ED Provider Notes (Signed)
[facility] EMERGENCY DEPARTMENT Provider Note   CSN: [id number] Arrival date & time: 04/12/19  1936    History   Chief Complaint Chief Complaint  Patient presents with  . Medication Refill    HPI Luther HearingSamuel T Oliva is a 58 y.o. male.     HPI  58 year old male with history of chronic pain, chronic benzodiazepine use, hypertension and stroke comes in with chief complaint of medication refill.  Patient reports that his prior physician lost his medical license because of his prescription habits.  He was supposed to see a different doctor on April 7, that appointment was canceled because of COVID-19.  His next appointment is rescheduled for May 1.  In the interim he ran out of the medication 3 days ago.  Patient is feeling a little anxious and having some loose bowel movements, agitation -therefore he decided to come to the ER.  Past Medical History:  Diagnosis Date  . Chronic pain   . Dizziness   . Headache   . Hypertension   . Stroke [facility](HCC)     There are no active problems to display for this patient.   Past Surgical History:  Procedure Laterality Date  . TONSILLECTOMY          Home Medications    Prior to Admission medications   Medication Sig Start Date End Date Taking? Authorizing Provider  albuterol (PROVENTIL HFA;VENTOLIN HFA) 108 (90 Base) MCG/ACT inhaler Inhale 1-2 puffs into the lungs every 6 (six) hours as needed for wheezing or shortness of breath. Patient taking differently: Inhale into the lungs daily as needed for wheezing or shortness of breath.  01/23/19   Derwood KaplanNanavati, Truda Staub, MD  alprazolam Prudy Feeler(XANAX) 2 MG tablet Take 2 mg by mouth 3 (three) times daily as needed.     [provider]  furosemide (LASIX) 20 MG tablet Take 1 tablet (20 mg total) by mouth daily. 04/11/18   Rancour, Jeannett SeniorStephen, MD  oxymetazoline (AFRIN) 0.05 % nasal spray Place 2 sprays into both nostrils 2 (two) times daily as needed for congestion.    [provider]  potassium  chloride (K-DUR) 10 MEQ tablet Take 1 tablet (10 mEq total) by mouth daily. 04/11/18   Glynn Octaveancour, Stephen, MD    Family History Family History  Problem Relation Age of Onset  . Stroke Father   . Heart failure Father     Social History Social History   Tobacco Use  . Smoking status: Current Every Day Smoker    Packs/day: 1.00    Years: 27.00    Pack years: 27.00    Types: Cigarettes  . Smokeless tobacco: Never Used  Substance Use Topics  . Alcohol use: No  . Drug use: No     Allergies   Codeine; Other; Penicillins; and Prednisone   Review of Systems Review of Systems  Constitutional: Positive for fatigue. Negative for diaphoresis.  Gastrointestinal: Positive for diarrhea.  Psychiatric/Behavioral: Positive for dysphoric mood. Negative for suicidal ideas. The patient is nervous/anxious.      Physical Exam Updated Vital Signs Ht 5\' 9"  (1.753 m)   Wt 102.1 kg   BMI 33.23 kg/m   Physical Exam Vitals signs and nursing note reviewed.  Constitutional:      Appearance: He is well-developed.  Cardiovascular:     Rate and Rhythm: Normal rate.  Pulmonary:     Effort: Pulmonary effort is normal.  Skin:    General: Skin is warm.  Neurological:     Mental Status: He is  alert and oriented to person, place, and time.      ED Treatments / Results  Labs (all labs ordered are listed, but only abnormal results are displayed) Labs Reviewed - No data to display  EKG EKG Interpretation  Date/Time:  Tuesday April 12 2019 20:03:58 EDT Ventricular Rate:  76 PR Interval:    QRS Duration: 89 QT Interval:  376 QTC Calculation: 423 R Axis:   64 Text Interpretation:  Sinus rhythm No acute changes No significant change since last tracing Confirmed by Derwood Kaplan [phone]) on 04/12/2019 8:11:57 PM   Radiology No results found.  Procedures Procedures (including critical care time)  Medications Ordered in ED Medications - No data to display   Initial Impression /  Assessment and Plan / ED Course  I have reviewed the triage vital signs and the nursing notes.  Pertinent labs & imaging results that were available during my care of the patient were reviewed by me and considered in my medical decision making (see chart for details).        58 year old male comes to the ER with chief complaint of medication refill. He is feeling anxious and has run out of his benzodiazepine 3 days ago.  On my exam, he does not have any overt signs of severe benzo withdrawals.  He is hemodynamically stable, has no confusion, no diaphoresis and does not appear toxic.  I informed him that it would be unwise for [place] to give him prescription, for him to only run out in few days.  He has appointment coming on May 1, and for him to keep that appointment for now.  He needs to return to the ER if his symptoms get severe.  Patient not quite happy with our discussion, but appears to be understanding.  Appropriate discharge paperwork with warning signs will be provided.  Strict ER return precautions have been discussed verbally.  Final Clinical Impressions(s) / ED Diagnoses   Final diagnoses:  Benzodiazepine dependence (HCC)  Encounter for medication refill    ED Discharge Orders    None       Derwood Kaplan, MD 04/12/19 2047

## 2019-04-12 NOTE — ED Notes (Signed)
RN went into pt's room to discharge pt and room was empty,

## 2019-05-05 IMAGING — US US EXTREM LOW VENOUS*L*
1 series · 13 of 24 positions shown · non-contrast
Comparison: None

CLINICAL DATA: LEFT leg pain and swelling from knee to foot



[Series 1: us extrem low venous*left* · 0.08mm/px · 13 of 43 slices shown]
[im 1/43]
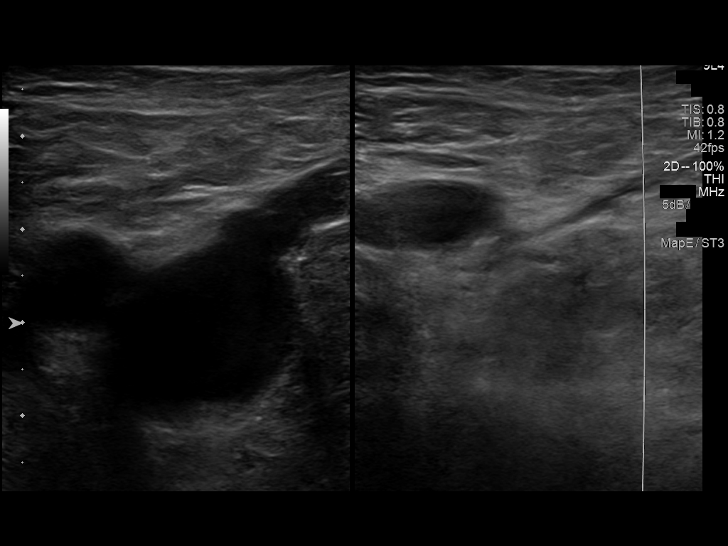
[im 4/43]
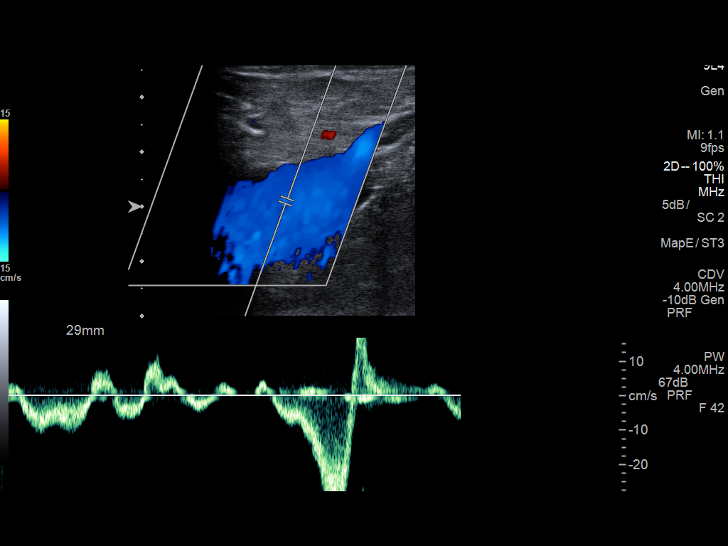
[im 8/43]
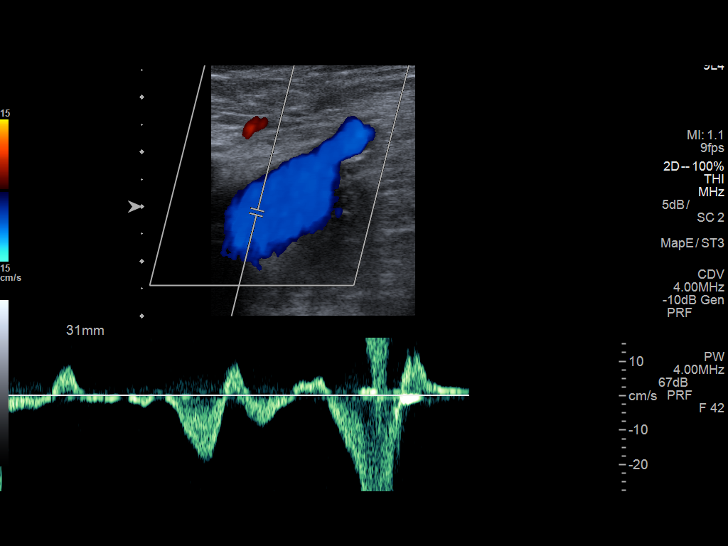
[im 11/43]
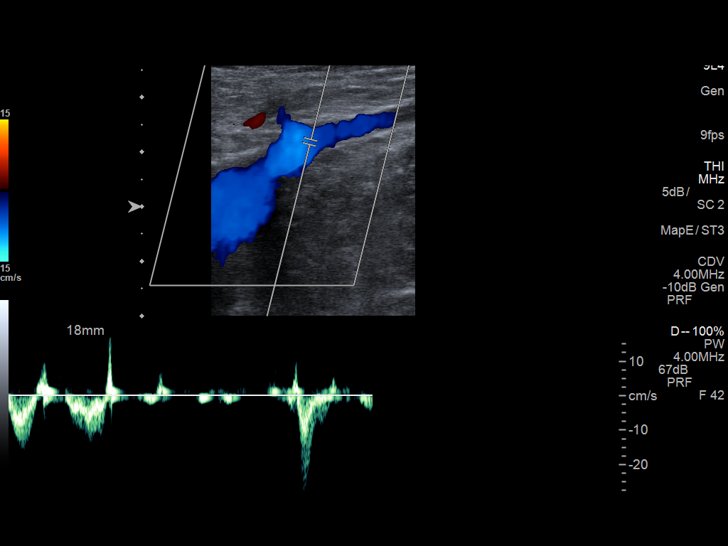
[im 15/43]
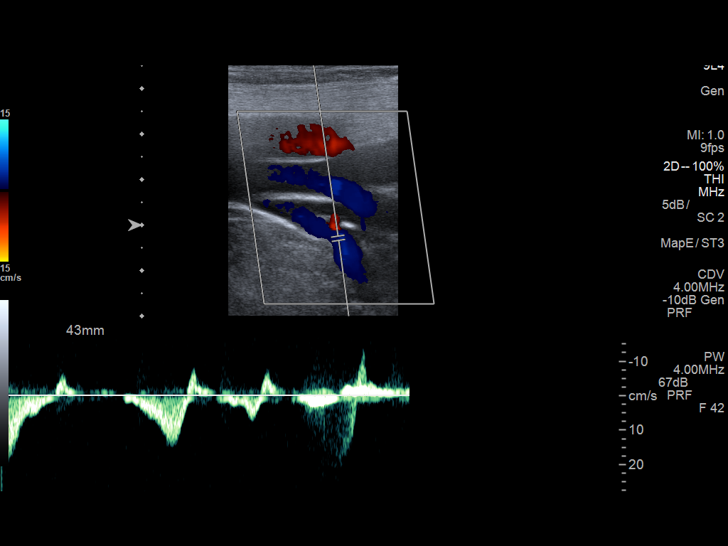
[im 19/43]
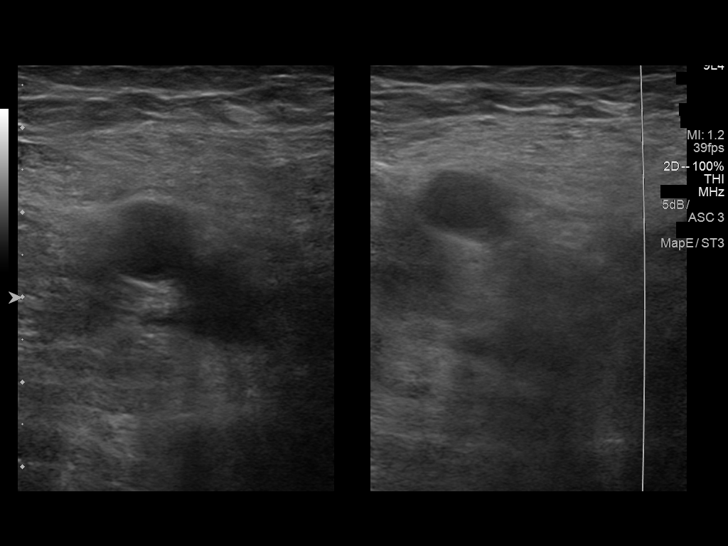
[im 22/43]
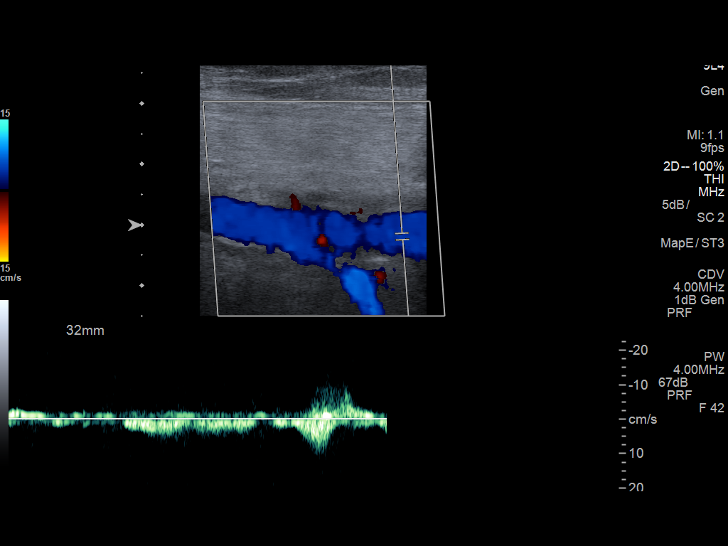
[im 24/43]
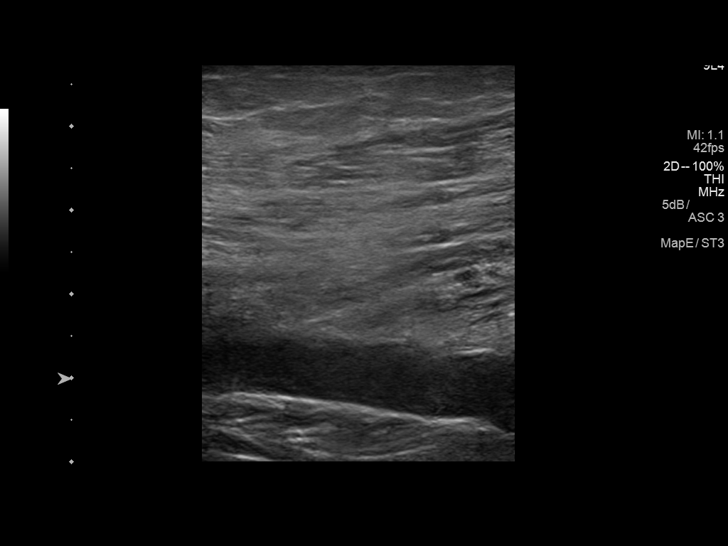
[im 28/43]
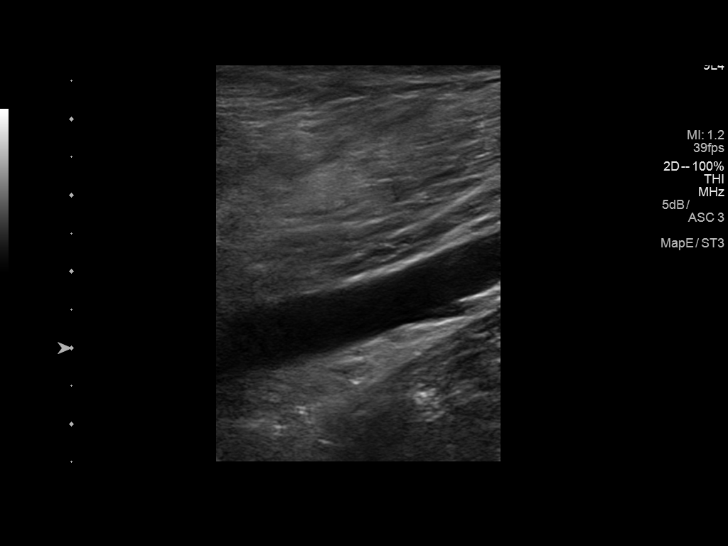
[im 32/43]
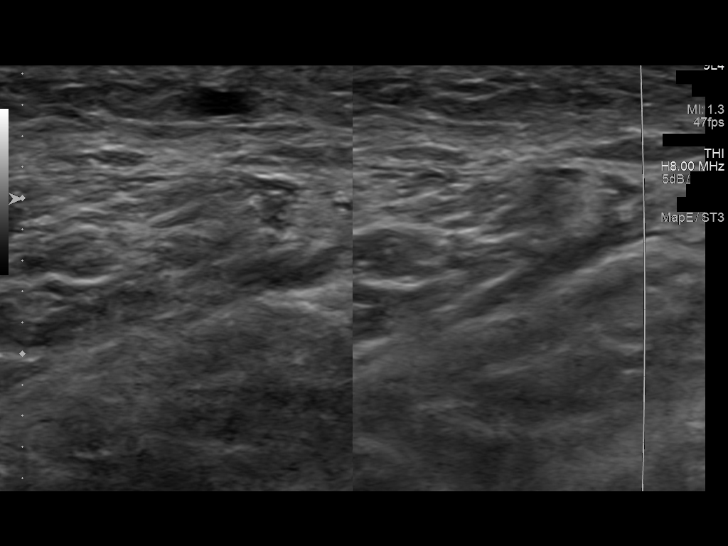
[im 35/43]
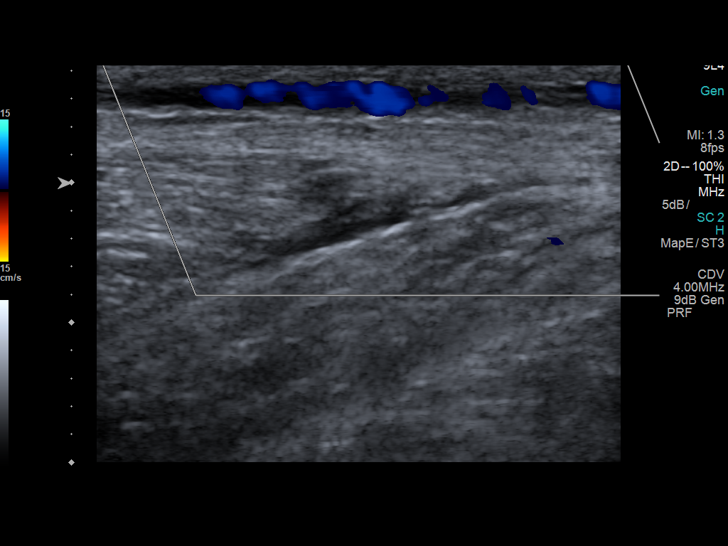
[im 39/43]
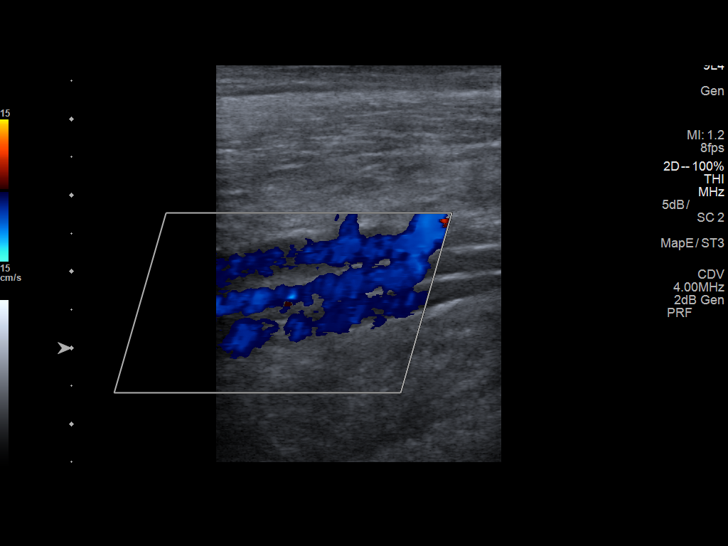
[im 43/43]
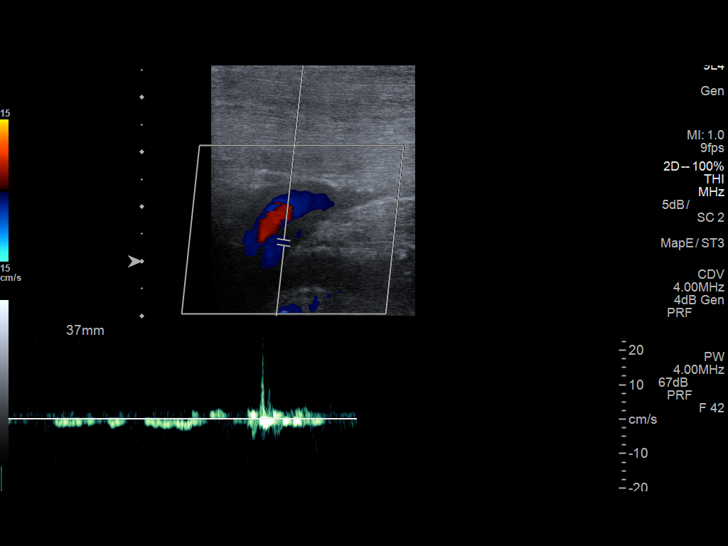

[13 of 24 positions shown; findings below may reference images not displayed]

FINDINGS: Contralateral Common Femoral Vein: Respiratory phasicity is normal
and symmetric with the symptomatic side. No evidence of thrombus.
Normal compressibility.

Common Femoral Vein: No evidence of thrombus. Normal
compressibility, respiratory phasicity and response to augmentation.

Saphenofemoral Junction: No evidence of thrombus. Normal
compressibility and flow on color Doppler imaging.

Profunda Femoral Vein: No evidence of thrombus. Normal
compressibility and flow on color Doppler imaging.

Femoral Vein: No evidence of thrombus. Normal compressibility,
respiratory phasicity and response to augmentation.

Popliteal Vein: No evidence of thrombus. Normal compressibility,
respiratory phasicity and response to augmentation.

Calf Veins: No evidence of thrombus. Normal compressibility and flow
on color Doppler imaging.

Superficial Great Saphenous Vein: No evidence of thrombus. Normal
compressibility and flow on color Doppler imaging.

Venous Reflux:  None.

Other Findings:  None.
IMPRESSION: No evidence of DVT within the LEFT lower extremity.

## 2019-07-18 ENCOUNTER — Encounter: Payer: Self-pay | Admitting: General Practice

## 2019-07-18 ENCOUNTER — Ambulatory Visit: Payer: Self-pay | Admitting: Physician Assistant

## 2019-07-21 ENCOUNTER — Encounter: Payer: Self-pay | Admitting: *Deleted

## 2019-09-16 ENCOUNTER — Encounter: Payer: Self-pay | Admitting: *Deleted

## 2019-10-24 ENCOUNTER — Other Ambulatory Visit: Payer: Self-pay

## 2019-10-24 ENCOUNTER — Encounter: Payer: Self-pay | Admitting: Surgery

## 2019-10-24 ENCOUNTER — Ambulatory Visit (INDEPENDENT_AMBULATORY_CARE_PROVIDER_SITE_OTHER): Payer: Medicaid Other | Admitting: Surgery

## 2019-10-24 VITALS — BP 127/84 | HR 67 | Temp 98.1°F | Resp 13 | Ht 69.0 in | Wt 229.0 lb

## 2019-10-24 DIAGNOSIS — K4090 Unilateral inguinal hernia, without obstruction or gangrene, not specified as recurrent: Secondary | ICD-10-CM | POA: Diagnosis not present

## 2019-10-24 NOTE — Patient Instructions (Addendum)
Patient's surgery to be scheduled at [facility] with Dr Everlene FarrierPabon.  Please [facility] for information.  The patient is aware to have COVID-19 testing done on at the [facility] building drive thru ([id number] [facility] [place]) between 8:00 am and 10:30 am. He is aware to isolate after, have no visitors, wash hands frequently, and avoid touching face.   The patient is aware he will be contacted by the Pre-Admission Testing Department to complete a phone interview sometime in the near future.  Patient aware to be NPO after midnight and have a driver.   He is aware to check in at the [facility] entrance where he will be screened for the coronavirus and then sent to Same Day Surgery.   You will most likely be out of work 1-2 weeks for this surgery. You will return after your post-op appointment with a lifting restriction for approximately 4 more weeks.   Please see the (blue)pre-care form that you have been given today. If you have any questions, please call our office.  You may have a bruise in your groin and also swelling and brusing in your testicle area. You may use ice 4-5 times daily for 15-20 minutes each time. Make sure that you place a barrier between you and the ice pack. To decrease the swelling, you may roll up a bath towel and place it vertically in between your thighs with your testicles resting on the towel. You will want to keep this area elevated as much as possible for several days following surgery.    Inguinal Hernia, Adult Muscles help keep everything in the body in its proper place. But if a weak spot in the muscles develops, something can poke through. That is called a hernia. When this happens in the lower part of the belly (abdomen), it is called an inguinal hernia. (It takes its name from a part of the body in this region called the inguinal canal.) A weak spot in the wall of muscles lets some fat or part of the small intestine bulge through. An inguinal  hernia can develop at any age. Men get them more often than women. CAUSES  In adults, an inguinal hernia develops over time.  It can be triggered by:  Suddenly straining the muscles of the lower abdomen.  Lifting heavy objects.  Straining to have a bowel movement. Difficult bowel movements (constipation) can lead to this.  Constant coughing. This may be caused by smoking or lung disease.  Being overweight.  Being pregnant.  Working at a job that requires long periods of standing or heavy lifting.  Having had an inguinal hernia before. One type can be an emergency situation. It is called a strangulated inguinal hernia. It develops if part of the small intestine slips through the weak spot and cannot get back into the abdomen. The blood supply can be cut off. If that happens, part of the intestine may die. This situation requires emergency surgery. SYMPTOMS  Often, a small inguinal hernia has no symptoms. It is found when a healthcare provider does a physical exam. Larger hernias usually have symptoms.   In adults, symptoms may include:  A lump in the groin. This is easier to see when the person is standing. It might disappear when lying down.  In men, a lump in the scrotum.  Pain or burning in the groin. This occurs especially when lifting, straining or coughing.  A dull ache or feeling of pressure in the groin.  Signs of a strangulated hernia can include:  A bulge in the groin that becomes very painful and tender to the touch.  A bulge that turns red or purple.  Fever, nausea and vomiting.  Inability to have a bowel movement or to pass gas. DIAGNOSIS  To decide if you have an inguinal hernia, a healthcare provider will probably do a physical examination.  This will include asking questions about any symptoms you have noticed.  The healthcare provider might feel the groin area and ask you to cough. If an inguinal hernia is felt, the healthcare provider may try to slide  it back into the abdomen.  Usually no other tests are needed. TREATMENT  Treatments can vary. The size of the hernia makes a difference. Options include:  Watchful waiting. This is often suggested if the hernia is small and you have had no symptoms.  No medical procedure will be done unless symptoms develop.  You will need to watch closely for symptoms. If any occur, contact your healthcare provider right away.  Surgery. This is used if the hernia is larger or you have symptoms.  Open surgery. This is usually an outpatient procedure (you will not stay overnight in a hospital). An cut (incision) is made through the skin in the groin. The hernia is put back inside the abdomen. The weak area in the muscles is then repaired by herniorrhaphy or hernioplasty. Herniorrhaphy: in this type of surgery, the weak muscles are sewn back together. Hernioplasty: a patch or mesh is used to close the weak area in the abdominal wall.  Laparoscopy. In this procedure, a surgeon makes small incisions. A thin tube with a tiny video camera (called a laparoscope) is put into the abdomen. The surgeon repairs the hernia with mesh by looking with the video camera and using two long instruments. HOME CARE INSTRUCTIONS   After surgery to repair an inguinal hernia:  You will need to take pain medicine prescribed by your healthcare provider. Follow all directions carefully.  You will need to take care of the wound from the incision.  Your activity will be restricted for awhile. This will probably include no heavy lifting for several weeks. You also should not do anything too active for a few weeks. When you can return to work will depend on the type of job that you have.  During "watchful waiting" periods, you should:  Maintain a healthy weight.  Eat a diet high in fiber (fruits, vegetables and whole grains).  Drink plenty of fluids to avoid constipation. This means drinking enough water and other liquids to keep  your urine clear or pale yellow.  Do not lift heavy objects.  Do not stand for long periods of time.  Quit smoking. This should keep you from developing a frequent cough. SEEK MEDICAL CARE IF:   A bulge develops in your groin area.  You feel pain, a burning sensation or pressure in the groin. This might be worse if you are lifting or straining.  You develop a fever of more than 100.5 F (38.1 C). SEEK IMMEDIATE MEDICAL CARE IF:   Pain in the groin increases suddenly.  A bulge in the groin gets bigger suddenly and does not go down.  For men, there is sudden pain in the scrotum. Or, the size of the scrotum increases.  A bulge in the groin area becomes red or purple and is painful to touch.  You have nausea or vomiting that does not go away.  You feel your  heart beating much faster than normal.  You cannot have a bowel movement or pass gas.  You develop a fever of more than 102.0 F (38.9 C).   This information is not intended to replace advice given to you by your health care provider. Make sure you discuss any questions you have with your health care provider.   Document Released: 04/26/2009 Document Revised: 03/01/2012 Document Reviewed: 06/11/2015 Elsevier Interactive Patient Education [employer].

## 2019-10-25 ENCOUNTER — Telehealth: Payer: Self-pay

## 2019-10-25 NOTE — Telephone Encounter (Signed)
Pt has been advised of pre admission date/time, Covid Testing date and Surgery date.  Surgery Date: 11/01/19 Preadmission Testing Date: 10/27/19 at 2:30 pm Covid Testing Date: 10/28/19 - patient advised [facility] to the [facility] between 8-10:30 am  Emmi Video sent via [facility] Surgical Video and [employer].  Patient has been made aware to call [phone], between 1-3:00pm the day before surgery, to find out what time to arrive.

## 2019-10-26 NOTE — Progress Notes (Signed)
Patient ID: Gary Harrington, male   DOB: 28-Nov-1961, 58 y.o.   MRN: [id number]  HPI Gary Harrington is a 58 y.o. male seen for a new left inguinal bulge.  He reports that he has been having this for about a year or so with intermittent pain.  Pain worsens when he does have Valsalva when he stands up.  Pain is intermittent sharp in nature and moderate intensity.  Patient has never had any previous inguinal surgeries in the past.  He does have a significant history of anxiety and chronic pain.  He takes [employer] on a regular basis.  He takes benzodiazepines as well.  He is able to perform more than 4 METS of activity without any shortness of breath or chest pain.  He smokes daily  HPI  Past Medical History:  Diagnosis Date  . Chronic pain   . Dizziness   . Headache   . Hypertension   . Stroke [facility]) 2000    Past Surgical History:  Procedure Laterality Date  . TONSILLECTOMY  age 10    Family History  Problem Relation Age of Onset  . Stroke Father   . Heart failure Father     Social History Social History   Tobacco Use  . Smoking status: Current Every Day Smoker    Packs/day: 2.00    Years: 27.00    Pack years: 54.00    Types: Cigarettes  . Smokeless tobacco: Never Used  Substance Use Topics  . Alcohol use: No  . Drug use: No    Allergies  Allergen Reactions  . Codeine Hives and Itching  . Other Itching    All Steroids  . Penicillins Hives    Has patient had a PCN reaction causing immediate rash, facial/tongue/throat swelling, SOB or lightheadedness with hypotension:yes    Has patient had a PCN reaction causing severe rash involving mucus membranes or skin necrosis: No Has patient had a PCN reaction that required hospitalization {Yes/No:[id number] Has patient had a PCN reaction occurring within the last 10 years:noNono If all of the above answers are "NO", then may proceed with Cephalosporin use.    . Prednisone Hives    Current Outpatient Medications   Medication Sig Dispense Refill  . albuterol (PROVENTIL HFA;VENTOLIN HFA) 108 (90 Base) MCG/ACT inhaler Inhale 1-2 puffs into the lungs every 6 (six) hours as needed for wheezing or shortness of breath. (Patient taking differently: Inhale into the lungs daily as needed for wheezing or shortness of breath. ) 1 Inhaler 0  . alprazolam (XANAX) 2 MG tablet Take 2 mg by mouth 3 (three) times daily as needed.     . Aspirin-Acetaminophen (GOODYS BODY PAIN PO) Take 1 Package by mouth every 4 (four) hours as needed (pain).     No current facility-administered medications for this visit.      Review of Systems Full ROS  was asked and was negative except for the information on the HPI  Physical Exam Blood pressure 127/84, pulse 67, temperature 98.1 F (36.7 C), resp. rate 13, height 5\' 9"  (1.753 m), weight 229 lb (103.9 kg), SpO2 96 %. CONSTITUTIONAL: NAD EYES: Pupils are equal, round, and reactive to light, Sclera are non-icteric. EARS, NOSE, MOUTH AND THROAT: The oropharynx is clear. The oral mucosa is pink and moist. Hearing is intact to voice. LYMPH NODES:  Lymph nodes in the neck are normal. RESPIRATORY:  Lungs are clear. There is normal respiratory effort, with equal breath sounds bilaterally, and without pathologic use of  accessory muscles. CARDIOVASCULAR: Heart is regular without murmurs, gallops, or rubs. GI: The abdomen is  soft, nontender, and nondistended. Reducible Left inguinal hernia, tender to palpation. There are no palpable masses. There is no hepatosplenomegaly. There are normal bowel sounds in all quadrants. GU: Rectal deferred.   MUSCULOSKELETAL: Normal muscle strength and tone. No cyanosis or edema.   SKIN: Turgor is good and there are no pathologic skin lesions or ulcers. NEUROLOGIC: Motor and sensation is grossly normal. Cranial nerves are grossly intact. PSYCH:  Oriented to person, place and time. Affect is normal.  Data Reviewed  I have personally reviewed the patient's  imaging, laboratory findings and medical records.    Assessment/Plan 58 year old male with a symptomatic left inguinal hernia in need for repair.  Cussed with the patient in detail about his disease process and the rationale for repair.  I do think that he is a good candidate for robotic approach.  Procedure discussed with the patient in detail.  Risk, benefits and possible implications including but not limited to: Bleeding, infection, chronic pain and recurrences as well as mesh problems.  He understands and wishes to proceed.  He also requested some pain medication but I was not comfortable giving him any narcotics at this time I did offer gabapentin and he declined at this time.   Sterling Big, MD FACS General Surgeon 10/26/2019, 1:45 PM

## 2019-10-27 ENCOUNTER — Encounter
Admission: RE | Admit: 2019-10-27 | Discharge: 2019-10-27 | Disposition: A | Discharge status: Home / Self Care | Payer: Medicaid Other | Source: Ambulatory Visit | Attending: Surgery | Admitting: Surgery

## 2019-10-27 ENCOUNTER — Telehealth: Payer: Self-pay | Admitting: Surgery

## 2019-10-27 ENCOUNTER — Encounter: Payer: Self-pay | Admitting: Physician Assistant

## 2019-10-27 ENCOUNTER — Ambulatory Visit (INDEPENDENT_AMBULATORY_CARE_PROVIDER_SITE_OTHER): Payer: Medicaid Other | Admitting: Physician Assistant

## 2019-10-27 ENCOUNTER — Other Ambulatory Visit: Payer: Self-pay

## 2019-10-27 VITALS — BP 119/83 | HR 84 | Temp 97.7°F | Resp 14 | Ht 69.0 in | Wt 226.0 lb

## 2019-10-27 DIAGNOSIS — Z01812 Encounter for preprocedural laboratory examination: Secondary | ICD-10-CM | POA: Insufficient documentation

## 2019-10-27 DIAGNOSIS — Z20828 Contact with and (suspected) exposure to other viral communicable diseases: Secondary | ICD-10-CM | POA: Diagnosis not present

## 2019-10-27 DIAGNOSIS — K4090 Unilateral inguinal hernia, without obstruction or gangrene, not specified as recurrent: Secondary | ICD-10-CM | POA: Diagnosis not present

## 2019-10-27 LAB — CBC
HCT: 43.7 % (ref 39.0–52.0)
Hemoglobin: 13.7 g/dL (ref 13.0–17.0)
MCH: 20.7 pg — ABNORMAL LOW (ref 26.0–34.0)
MCHC: 31.4 g/dL (ref 30.0–36.0)
MCV: 66.1 fL — ABNORMAL LOW (ref 80.0–100.0)
Platelets: 277 10*3/uL (ref 150–400)
RBC: 6.61 MIL/uL — ABNORMAL HIGH (ref 4.22–5.81)
RDW: 17.3 % — ABNORMAL HIGH (ref 11.5–15.5)
WBC: 11.7 10*3/uL — ABNORMAL HIGH (ref 4.0–10.5)
nRBC: 0.2 % (ref 0.0–0.2)

## 2019-10-27 LAB — BASIC METABOLIC PANEL
Anion gap: 10 (ref 5–15)
BUN: 16 mg/dL (ref 6–20)
CO2: 25 mmol/L (ref 22–32)
Calcium: 9.4 mg/dL (ref 8.9–10.3)
Chloride: 103 mmol/L (ref 98–111)
Creatinine, Ser: 0.85 mg/dL (ref 0.61–1.24)
GFR calc Af Amer: >60 mL/min (ref >60)
GFR calc non Af Amer: >60 mL/min (ref >60)
Glucose, Bld: 89 mg/dL (ref 70–99)
Potassium: 4.1 mmol/L (ref 3.5–5.1)
Sodium: 138 mmol/L (ref 135–145)

## 2019-10-27 MED ORDER — OXYCODONE HCL 5 MG PO TABS: 5.0000 mg | ORAL_TABLET | Freq: Four times a day (QID) | ORAL | 0 refills | Status: DC | PRN

## 2019-10-27 NOTE — Telephone Encounter (Signed)
Patient is calling said he was in so much pain he ended up going to [facility]. Patient said he wasn't happy with the care at [facility] and left. Patient said he hasn't been able to sleep and is in a lot of pain, is there something we can call into the pharmacy for the patient. Please call patient and advise.

## 2019-10-27 NOTE — Progress Notes (Signed)
[facility] SUR[facility]ICAL CL[facility]IC NOTE  10/27/2019  History of Present Illness: Gary Harrington is a 58 y.o. male who has been following with Dr Dahlia Byes for a symptomatic left inguinal hernia with plans for robotic assisted hernia repair on 11/01/2019. He comes in to clinic today after calling our office regarding his left inguinal pain. He reports that it has remained the same vs slightly worsened since he saw Dr Dahlia Byes on 11/02. He tried to go to [facility] for the pain but was unhappy. He denied any fever, chills, nausea, or emesis. Still with bowel function. He is taking 6 goodies powders a day for this pain. Only other medicine he has is xanax. Not taking any narcotics. No other complaints.   Past Medical History: Past Medical History:  Diagnosis Date  . Chronic pain   . Dizziness   . Headache   . Hypertension   . Stroke [facility]) 2000     Past Surgical History: Past Surgical History:  Procedure Laterality Date  . TONSILLECTOMY  age 42    Home Medications: Prior to Admission medications   Medication Sig Start Date End Date Taking? Authorizing Provider  albuterol (PROVENTIL HFA;VENTOLIN HFA) 108 (90 Base) MCG/ACT inhaler Inhale 1-2 puffs into the lungs every 6 (six) hours as needed for wheezing or shortness of breath. Patient taking differently: Inhale into the lungs daily as needed for wheezing or shortness of breath.  01/23/19   Varney Biles, MD  alprazolam [name]) 2 MG tablet Take 2 mg by mouth 3 (three) times daily as needed.     [provider]  Aspirin-Acetaminophen (GOODYS BODY PAIN PO) Take 1 Package by mouth every 4 (four) hours as needed (pain).    [provider]  oxyCODONE (OXY IR/ROXICODONE) 5 MG immediate release tablet Take 1 tablet (5 mg total) by mouth every 6 (six) hours as needed for severe pain or breakthrough pain. 10/27/19   [name], PA-C    Allergies: Allergies  Allergen Reactions  . Codeine Hives and Itching  . Other Itching     All Steroids  . Penicillins Hives    Has patient had a PCN reaction causing immediate rash, facial/tongue/throat swelling, SOB or lightheadedness with hypotension:yes    Has patient had a PCN reaction causing severe rash involving mucus membranes or skin necrosis: No Has patient had a PCN reaction that required hospitalization {Yes/No:[id number] Has patient had a PCN reaction occurring within the last 10 years:noNono If all of the above answers are "NO", then may proceed with Cephalosporin use.    . Prednisone Hives    Review of Systems: Review of Systems  Constitutional: Negative for chills and fever.  Gastrointestinal: Positive for abdominal pain. Negative for constipation, diarrhea, nausea and vomiting.  Genitourinary: Negative for dysuria and urgency.  All other systems reviewed and are negative.   Physical Exam BP 119/83   Pulse 84   Temp 97.7 F (36.5 C) (Temporal)   Resp 14   Ht 5\' 9"  (1.753 m)   Wt 226 lb (102.5 kg)   SpO2 97%   BMI 33.37 kg/m  CONSTITUTIONAL: Well appearing male, NAD HEENT:  Normocephalic, atraumatic, extraocular motion intact. RESPIRATORY:   Normal respiratory effort without pathologic use of accessory muscles. GI: The abdomen is soft, non-tender, non-distended. Reducible Left inguinal hernia, tender to palpation. There are no palpable masses. There is no hepatosplenomegaly. There are normal bowel sounds in all quadrants. NEUROLOGIC:  Motor and sensation is grossly normal.  Cranial nerves are grossly intact.  PSYCH:  Alert and oriented to person, place and time. Affect is normal.  Labs/Imaging: No new pertinent imaging studies  Assessment and Plan: This is a 58 y.o. male with symptomatic reducible left inguinal hernia with plans for surgery with Dr Everlene Farrier n 11/10   - I will give him a very short course of 5 mg Oxycodone for pain control in attempt to decrease the amount of BC powder he is using. He understands this is a 1 time prescription to  get him through to his surgery, which he understands. I reviewed his [place] controlled substance database and he had not had a narcotic prescription in some time.   - Encouraged him to STOP using BC powder pre-op  - continue with pre-op testing as scheduled  - follow up with Dr Everlene Farrier for surgery on 11/10  Face-to-face time spent with the patient and care providers  was 10 minutes, with more than 50% of the time spent counseling, educating, and coordinating care of the patient.     Lynden Oxford, PA-C [facility] 10/27/2019, 12:20 PM [phone] M-F: 7am - 4pm

## 2019-10-27 NOTE — Patient Instructions (Signed)
Stop taking Goody powders today.

## 2019-10-27 NOTE — Telephone Encounter (Signed)
Spoke with patient he was seen yesterday at [facility] and was unhappy.  Denies fever, chills, good bowel movements. He hasn't slept in 3 nights. He takes a 6 pack of goody powders in a day. Tylenol is not helping. Spoke with Gary Harrington and patient added to schedule today to be seen.

## 2019-10-27 NOTE — Patient Instructions (Signed)
Your procedure is scheduled on: Tuesday 11/01/19  Report to DAY SURGERY DEPARTMENT LOCATED ON 2ND FLOOR [facility] ENTRANCE. To find out your arrival time please call [phone] between 1PM - 3PM on Monday 10/31/19.   Remember: Instructions that are not followed completely may result in serious medical risk, up to and including death, or upon the discretion of your surgeon and anesthesiologist your surgery may need to be rescheduled.      _X__ 1. Do not eat food after midnight the night before your procedure.                 No gum chewing or hard candies. You may drink clear liquids up to 2 hours                 before you are scheduled to arrive for your surgery- DO NOT drink clear                 liquids within 2 hours of the start of your surgery.                 Clear Liquids include:  water, apple juice without pulp, clear carbohydrate                 drink such as Clearfast or Gatorade, Black Coffee or Tea (Do not add                 milk or creamer to coffee or tea).    __X__2.  On the morning of surgery brush your teeth with toothpaste and water, you may rinse your mouth with mouthwash if you wish.  Do not swallow any toothpaste or mouthwash.       _X__ 4.  Do Not Smoke or use e-cigarettes For 24 Hours Prior to Your Surgery.                 Do not use any chewable tobacco products for at least 6 hours prior to                 surgery.    __X__5.  Notify your doctor if there is any change in your medical condition      (cold, fever, infections).       Do not wear jewelry, make-up, hairpins, clips or nail polish. Do not wear lotions, powders, or perfumes.  Do not shave 48 hours prior to surgery. Men may shave face and neck. Do not bring valuables to the hospital.      [facility] is not responsible for any belongings or valuables.    Contacts, dentures/partials or body piercings may not be worn into surgery. Bring a case for your contacts, glasses or hearing  aids, a denture cup will be supplied.     Patients discharged the day of surgery will not be allowed to drive home.     Please read over the following fact sheets that you were given:   MRSA Information    __X__ Take these medicines the morning of surgery with A SIP OF WATER:     1. albuterol (PROVENTIL HFA;VENTOLIN HFA) 108 (90 Base) MCG/ACT inhaler  2. oxyCODONE (OXY IR/ROXICODONE) 5 MG immediate release tablet if neeeded  3. alprazolam (XANAX) 2 MG tablet if needed      __X__ Use CHG Soap as directed    __X__ Stop Anti-inflammatories 7 days before surgery such as Advil, Ibuprofen, Motrin, BC or Goodies Powder, Naprosyn, Naproxen, Aleve, Aspirin, Meloxicam. May take  Tylenol or Oxycodone if needed for pain or discomfort.     __X__ Don't start taking any new herbal supplements before your procedure.

## 2019-10-28 ENCOUNTER — Other Ambulatory Visit: Admission: RE | Admit: 2019-10-28 | Payer: Medicaid Other | Source: Ambulatory Visit

## 2019-10-28 LAB — SARS CORONAVIRUS 2 (TAT 6-24 HRS): SARS Coronavirus 2: NEGATIVE

## 2019-10-29 ENCOUNTER — Telehealth: Payer: Self-pay

## 2019-10-29 NOTE — Telephone Encounter (Signed)
Called and informed patient that test for Covid 19 was NEGATIVE. Discussed signs and symptoms of Covid 19 : fever, chills, respiratory symptoms, cough, ENT symptoms, sore throat, SOB, muscle pain, diarrhea, headache, loss of taste/smell, close exposure to COVID-19 patient. Pt instructed to call PCP if they develop the above signs and sx. Pt also instructed to call 911 if having respiratory issues/distress. Discussed MyChart enrollment. Pt verbalized understanding.  

## 2019-10-31 ENCOUNTER — Telehealth: Payer: Self-pay | Admitting: Surgery

## 2019-10-31 NOTE — Telephone Encounter (Signed)
Patient has called and stated that he is very sick. He feels like he may have bronchitis. He is calling his PCP today to see if he can make an appointment. He has requested to cancel surgery at this time that was scheduled for 11/01/19 with Dr Kris Mouton assisted left inguinal hernia repair. I have advised the patient to call back once he is feeling better and wanting to reschedule. I have contacted the OR as well as advised Dr Dahlia Byes.

## 2019-11-01 ENCOUNTER — Ambulatory Visit: Admission: RE | Admit: 2019-11-01 | Payer: Medicaid Other | Source: Home / Self Care | Admitting: Surgery

## 2019-11-01 ENCOUNTER — Other Ambulatory Visit: Payer: Self-pay

## 2019-11-01 ENCOUNTER — Emergency Department (HOSPITAL_COMMUNITY)
Admission: EM | Admit: 2019-11-01 | Discharge: 2019-11-01 | Disposition: A | Discharge status: Home / Self Care | Payer: Medicaid Other | Attending: Emergency Medicine | Admitting: Emergency Medicine

## 2019-11-01 ENCOUNTER — Encounter: Admission: RE | Payer: Self-pay | Source: Home / Self Care

## 2019-11-01 ENCOUNTER — Encounter (HOSPITAL_COMMUNITY): Payer: Self-pay | Admitting: *Deleted

## 2019-11-01 DIAGNOSIS — R0981 Nasal congestion: Secondary | ICD-10-CM | POA: Insufficient documentation

## 2019-11-01 DIAGNOSIS — Z5321 Procedure and treatment not carried out due to patient leaving prior to being seen by health care provider: Secondary | ICD-10-CM | POA: Insufficient documentation

## 2019-11-01 SURGERY — REPAIR, HERNIA, INGUINAL, ROBOT-ASSISTED, LAPAROSCOPIC, USING MESH
Anesthesia: General | Laterality: Left

## 2019-11-01 NOTE — ED Triage Notes (Signed)
Pt states he was supposed to have hernia surgery this am but was told by his surgeon that he could not have anesthesia due to his "head cold" and he needed antibiotics

## 2019-11-02 ENCOUNTER — Telehealth: Payer: Self-pay | Admitting: Surgery

## 2019-11-02 NOTE — Telephone Encounter (Signed)
Patient has called and stated that he is in pain at the site of his hernia. He states that he was given oxycodone 5 mg on 10/27/19 by our office. Patient did call on 10/31/19 and requested to cancel his surgery that was scheduled for 11/01/19 with Dr Kris Mouton assisted inguinal hernia repair Left due to possible bronchitis. He did inform me that he went to the [facility] yesterday morning to see if he could get an antibiotic, however he left because there were too many people in the waiting room. He then stated that he went to see his PCP-Dr Brunetta Genera and was given antibiotic. I did call his PCP and they are going to fax over clinic notes from his visit. He was diagnosed with acute bronchitis. The patient did want to reschedule his surgery, however given that he has a diagnosis of acute bronchitis we will have to wait up to 6 weeks. I have advised the patient that we can not prescribe him any narcotics at this time and that if he is in severe pain, that he will need to be seen at the ED. Patient was a little upset and stated that he had 3 pills left from the previous prescription and that the reason he wanted another type of medication is because the oxycodone gave him hives and he broke out in a sweat. I did inform him to discontinue the use of the medication and to rotate tylenol and ibuprofen as advised by Dr Dahlia Byes. Patient stated that he may come the to the [facility] ED today due to his pain. I did recommend to the patient that, Per Dr Dahlia Byes, we could refer him to pain management, however patient declined at this time.   I did request notes from his PCP to be faxed. I will reschedule the patient's surgery and call patient once dates have been confirmed.

## 2019-11-03 ENCOUNTER — Encounter: Payer: Self-pay | Admitting: Emergency Medicine

## 2019-11-03 ENCOUNTER — Emergency Department
Admission: EM | Admit: 2019-11-03 | Discharge: 2019-11-03 | Disposition: A | Discharge status: Home / Self Care | Payer: Medicaid Other | Attending: Emergency Medicine | Admitting: Emergency Medicine

## 2019-11-03 ENCOUNTER — Telehealth: Payer: Self-pay | Admitting: Surgery

## 2019-11-03 ENCOUNTER — Other Ambulatory Visit: Payer: Self-pay

## 2019-11-03 DIAGNOSIS — Z79899 Other long term (current) drug therapy: Secondary | ICD-10-CM | POA: Insufficient documentation

## 2019-11-03 DIAGNOSIS — G8929 Other chronic pain: Secondary | ICD-10-CM | POA: Diagnosis not present

## 2019-11-03 DIAGNOSIS — R10814 Left lower quadrant abdominal tenderness: Secondary | ICD-10-CM | POA: Diagnosis present

## 2019-11-03 DIAGNOSIS — I10 Essential (primary) hypertension: Secondary | ICD-10-CM | POA: Diagnosis not present

## 2019-11-03 DIAGNOSIS — Z8673 Personal history of transient ischemic attack (TIA), and cerebral infarction without residual deficits: Secondary | ICD-10-CM | POA: Diagnosis not present

## 2019-11-03 NOTE — Telephone Encounter (Signed)
Spoke with patient and he is in route to the hospital.  Please see previous note.

## 2019-11-03 NOTE — ED Provider Notes (Signed)
[facility] Emergency Department Provider Note   ____________________________________________    I have reviewed the triage vital signs and the nursing notes.   HISTORY  Chief Complaint Groin pain    HPI Gary Harrington is a 58 y.o. male who presents with complaints of groin pain.  Patient reports he has chronic hernia pain in his left groin, was apparently scheduled for surgery on Tuesday but for some reason this was postponed.  He notes that he has run out of oxycodone and he thinks he may be allergic to a regardless, he is requesting Vicodin.  Patient does have a history of chronic pain, review of records demonstrates that he has frequent requests for narcotic pain medication.  No fevers chills nausea or vomiting  Past Medical History:  Diagnosis Date  . Chronic pain   . Dizziness   . Headache   . Hypertension   . Stroke [facility]) 2000    There are no active problems to display for this patient.   Past Surgical History:  Procedure Laterality Date  . TONSILLECTOMY  age 53    Prior to Admission medications   Medication Sig Start Date End Date Taking? Authorizing Provider  albuterol (PROVENTIL HFA;VENTOLIN HFA) 108 (90 Base) MCG/ACT inhaler Inhale 1-2 puffs into the lungs every 6 (six) hours as needed for wheezing or shortness of breath. Patient taking differently: Inhale into the lungs daily as needed for wheezing or shortness of breath.  01/23/19   Derwood Kaplan, MD  alprazolam Prudy Feeler) 2 MG tablet Take 2 mg by mouth 3 (three) times daily as needed.     [provider]  Aspirin-Acetaminophen (GOODYS BODY PAIN PO) Take 1 Package by mouth every 4 (four) hours as needed (pain).    [provider]  oxyCODONE (OXY IR/ROXICODONE) 5 MG immediate release tablet Take 1 tablet (5 mg total) by mouth every 6 (six) hours as needed for severe pain or breakthrough pain. 10/27/19   Donovan Kail, PA-C     Allergies Codeine, Other,  Penicillins, and Prednisone  Family History  Problem Relation Age of Onset  . Stroke Father   . Heart failure Father     Social History Social History   Tobacco Use  . Smoking status: Current Every Day Smoker    Packs/day: 2.00    Years: 27.00    Pack years: 54.00    Types: Cigarettes  . Smokeless tobacco: Never Used  Substance Use Topics  . Alcohol use: No  . Drug use: No    Review of Systems  Constitutional: No fever/chills     Gastrointestinal: No abdominal pain Genitourinary: Left groin hernia, Musculoskeletal: Denies back pain at this time Skin: Negative for rash. Neurological: Negative for headaches     ____________________________________________   PHYSICAL EXAM:  VITAL SIGNS: ED Triage Vitals  Enc Vitals Group     BP 11/03/19 1321 120/74     Pulse Rate 11/03/19 1321 71     Resp 11/03/19 1321 20     Temp 11/03/19 1321 97.7 F (36.5 C)     Temp Source 11/03/19 1321 Oral     SpO2 11/03/19 1321 97 %     Weight 11/03/19 1328 104.3 kg (230 lb)     Height 11/03/19 1328 1.753 m (5\' 9" )     Head Circumference --      Peak Flow --      Pain Score 11/03/19 1328 10     Pain Loc --  Pain Edu? --      Excl. in [place]? --      Constitutional: Alert and oriented.    Mouth/Throat: Mucous membranes are moist.   Cardiovascular: Normal rate, regular rhythm.  Respiratory: Normal respiratory effort.  No retractions. Genitourinary: Left groin hernia, reducible, no erythema, no tenderness to palpation Musculoskeletal: No lower extremity tenderness nor edema.   Neurologic:  Normal speech and language. No gross focal neurologic deficits are appreciated.   Skin:  Skin is warm, dry and intact. No rash noted.   ____________________________________________   LABS (all labs ordered are listed, but only abnormal results are displayed)  Labs Reviewed - No data to display ____________________________________________  EKG    ____________________________________________  RADIOLOGY  None ____________________________________________   PROCEDURES  Procedure(s) performed: No  Procedures   Critical Care performed: No ____________________________________________   INITIAL IMPRESSION / ASSESSMENT AND PLAN / ED COURSE  Pertinent labs & imaging results that were available during my care of the patient were reviewed by me and considered in my medical decision making (see chart for details).  Patient with chronic pain, reassuring exam, described patient that we do not refill narcotic pain medications in the emergency department, did offer nonnarcotic pain medications however he refused and is somewhat upset with me.   ____________________________________________   FINAL CLINICAL IMPRESSION(S) / ED DIAGNOSES  Final diagnoses:  Other chronic pain      NEW MEDICATIONS STARTED DURING THIS VISIT:  Discharge Medication List as of 11/03/2019  1:38 PM       Note:  This document was prepared using Dragon voice recognition software and may include unintentional dictation errors.   Lavonia Drafts, MD 11/03/19 [phone]

## 2019-11-03 NOTE — ED Triage Notes (Signed)
Presents with family  States he needs pain med refill  Was given some meds by his surgeon   States he was to have his surgery    States is worse  Unable to sleep

## 2019-11-03 NOTE — Telephone Encounter (Signed)
Patient is calling and said he is in some pain and said he has broke out in hives with the pain medication he's on patient was told to call our office if the medicine wasn't working or broke him out, patient is also asking about when he is gonna be able to have his surgery done. Please call patient and advise.

## 2019-11-22 ENCOUNTER — Telehealth: Payer: Self-pay | Admitting: Surgery

## 2019-11-22 NOTE — Telephone Encounter (Signed)
Patient is calling said he has two more days on his antibotic from having bronchitis , and is wondering when he will get setup for surgery. Please call patient and advise.

## 2020-02-22 ENCOUNTER — Emergency Department (HOSPITAL_COMMUNITY)
Admission: EM | Admit: 2020-02-22 | Discharge: 2020-02-22 | Disposition: A | Discharge status: Home / Self Care | Payer: Medicaid Other | Attending: Emergency Medicine | Admitting: Emergency Medicine

## 2020-02-22 ENCOUNTER — Encounter (HOSPITAL_COMMUNITY): Payer: Self-pay | Admitting: *Deleted

## 2020-02-22 ENCOUNTER — Other Ambulatory Visit: Payer: Self-pay

## 2020-02-22 DIAGNOSIS — I10 Essential (primary) hypertension: Secondary | ICD-10-CM | POA: Diagnosis not present

## 2020-02-22 DIAGNOSIS — F17210 Nicotine dependence, cigarettes, uncomplicated: Secondary | ICD-10-CM | POA: Insufficient documentation

## 2020-02-22 DIAGNOSIS — K0889 Other specified disorders of teeth and supporting structures: Secondary | ICD-10-CM | POA: Insufficient documentation

## 2020-02-22 MED ORDER — CLINDAMYCIN HCL 300 MG PO CAPS: 300.0000 mg | ORAL_CAPSULE | Freq: Four times a day (QID) | ORAL | 0 refills | Status: DC

## 2020-02-22 NOTE — ED Triage Notes (Signed)
Pt c/o right upper dental pain that started two days ago,

## 2020-02-22 NOTE — ED Provider Notes (Signed)
[facility] EMERGENCY DEPARTMENT Provider Note   CSN: [id number] Arrival date & time: 02/22/20  1251     History Chief Complaint  Patient presents with  . Dental Pain    Gary Harrington is a 59 y.o. male.  Patient presents with right upper dental pain, onset two days ago. Widespread dental decay, missing, and broken teeth. Is awaiting oral surgery. No fevers/chills. No difficulty swallowing or opening mouth. Tolerating oral intake without difficulty.  The history is provided by the patient.  Dental Pain Location:  Upper Quality:  Throbbing and pressure-like Severity:  Moderate Duration:  2 days Timing:  Constant Progression:  Worsening Chronicity:  Recurrent Context: dental caries, dental fracture and poor dentition   Associated symptoms: gum swelling   Associated symptoms: no difficulty swallowing, no neck pain, no neck swelling and no trismus   Risk factors: lack of dental care        Past Medical History:  Diagnosis Date  . Chronic pain   . Dizziness   . Headache   . Hypertension   . Stroke [facility]) 2000    There are no problems to display for this patient.   Past Surgical History:  Procedure Laterality Date  . TONSILLECTOMY  age 29       Family History  Problem Relation Age of Onset  . Stroke Father   . Heart failure Father     Social History   Tobacco Use  . Smoking status: Current Every Day Smoker    Packs/day: 2.00    Years: 27.00    Pack years: 54.00    Types: Cigarettes  . Smokeless tobacco: Never Used  Substance Use Topics  . Alcohol use: No  . Drug use: No    Home Medications Prior to Admission medications   Medication Sig Start Date End Date Taking? Authorizing Provider  albuterol (PROVENTIL HFA;VENTOLIN HFA) 108 (90 Base) MCG/ACT inhaler Inhale 1-2 puffs into the lungs every 6 (six) hours as needed for wheezing or shortness of breath. Patient taking differently: Inhale into the lungs daily as needed for wheezing or shortness of  breath.  01/23/19   Varney Biles, MD  alprazolam [name]) 2 MG tablet Take 2 mg by mouth 3 (three) times daily as needed.     [provider]  Aspirin-Acetaminophen (GOODYS BODY PAIN PO) Take 1 Package by mouth every 4 (four) hours as needed (pain).    [provider]  oxyCODONE (OXY IR/ROXICODONE) 5 MG immediate release tablet Take 1 tablet (5 mg total) by mouth every 6 (six) hours as needed for severe pain or breakthrough pain. 10/27/19   [name], PA-C    Allergies    Codeine, Other, Penicillins, and Prednisone  Review of Systems   Review of Systems  HENT: Positive for dental problem.   Musculoskeletal: Negative for neck pain.  All other systems reviewed and are negative.   Physical Exam Updated Vital Signs BP 113/72   Pulse 79   Temp 98.1 F (36.7 C) (Oral)   Resp 16   Ht 5\' 9"  (1.753 m)   Wt 99.8 kg   SpO2 96%   BMI 32.49 kg/m   Physical Exam Constitutional:      Appearance: He is not ill-appearing.  HENT:     Mouth/Throat:     Mouth: Mucous membranes are moist.     Dentition: Abnormal dentition. Dental tenderness and dental caries present.  Eyes:     Conjunctiva/sclera: Conjunctivae normal.  Cardiovascular:  Rate and Rhythm: Normal rate and regular rhythm.  Pulmonary:     Effort: Pulmonary effort is normal.     Breath sounds: Normal breath sounds.  Abdominal:     Palpations: Abdomen is soft.  Musculoskeletal:     Cervical back: Neck supple.  Lymphadenopathy:     Cervical: No cervical adenopathy.  Skin:    General: Skin is warm and dry.  Neurological:     Mental Status: He is alert and oriented to person, place, and time.  Psychiatric:        Behavior: Behavior normal.     ED Results / Procedures / Treatments   Labs (all labs ordered are listed, but only abnormal results are displayed) Labs Reviewed - No data to display  EKG None  Radiology No results found.  Procedures Procedures (including critical care  time)  Medications Ordered in ED Medications - No data to display  ED Course  I have reviewed the triage vital signs and the nursing notes.  Pertinent labs & imaging results that were available during my care of the patient were reviewed by me and considered in my medical decision making (see chart for details).    MDM Rules/Calculators/A&P                      Patient with dentalgia.  No abscess requiring immediate incision and drainage.  Exam not concerning for Ludwig's angina or pharyngeal abscess.  Will treat with clindamycin. Pt instructed to follow-up with dentist.  Discussed return precautions. Pt safe for discharge. Final Clinical Impression(s) / ED Diagnoses Final diagnoses:  Tooth pain    Rx / DC Orders ED Discharge Orders         Ordered    clindamycin (CLEOCIN) 300 MG capsule  4 times daily     02/22/20 1423           Felicie Morn, NP 02/22/20 1440    Sabas Sous, MD 02/23/20 [phone]

## 2020-05-19 ENCOUNTER — Other Ambulatory Visit: Payer: Self-pay

## 2020-05-19 ENCOUNTER — Emergency Department (HOSPITAL_COMMUNITY)
Admission: EM | Admit: 2020-05-19 | Discharge: 2020-05-19 | Disposition: A | Discharge status: Home / Self Care | Payer: Medicaid Other | Attending: Emergency Medicine | Admitting: Emergency Medicine

## 2020-05-19 ENCOUNTER — Encounter (HOSPITAL_COMMUNITY): Payer: Self-pay | Admitting: Emergency Medicine

## 2020-05-19 DIAGNOSIS — F17210 Nicotine dependence, cigarettes, uncomplicated: Secondary | ICD-10-CM | POA: Diagnosis not present

## 2020-05-19 DIAGNOSIS — Z8673 Personal history of transient ischemic attack (TIA), and cerebral infarction without residual deficits: Secondary | ICD-10-CM | POA: Insufficient documentation

## 2020-05-19 DIAGNOSIS — I10 Essential (primary) hypertension: Secondary | ICD-10-CM | POA: Diagnosis not present

## 2020-05-19 DIAGNOSIS — L299 Pruritus, unspecified: Secondary | ICD-10-CM | POA: Insufficient documentation

## 2020-05-19 DIAGNOSIS — B880 Other acariasis: Secondary | ICD-10-CM

## 2020-05-19 DIAGNOSIS — R21 Rash and other nonspecific skin eruption: Secondary | ICD-10-CM | POA: Diagnosis present

## 2020-05-19 MED ORDER — HYDROXYZINE HCL 25 MG PO TABS: 25.0000 mg | ORAL_TABLET | Freq: Four times a day (QID) | ORAL | 0 refills | Status: DC | PRN

## 2020-05-19 MED ORDER — DOXYCYCLINE HYCLATE 100 MG PO CAPS: 100.0000 mg | ORAL_CAPSULE | Freq: Two times a day (BID) | ORAL | 0 refills | Status: DC

## 2020-05-19 MED ORDER — TRIAMCINOLONE ACETONIDE 0.1 % EX CREA: 1.0000 "application " | TOPICAL_CREAM | Freq: Two times a day (BID) | CUTANEOUS | 0 refills | Status: AC

## 2020-05-19 NOTE — ED Triage Notes (Signed)
Pt states he has chiggers all over of his body for about a week.

## 2020-05-19 NOTE — ED Provider Notes (Signed)
[facility] EMERGENCY DEPARTMENT Provider Note   CSN: [id number] Arrival date & time: 05/19/20  0319     History Chief Complaint  Patient presents with  . Rash    Gary Harrington is a 59 y.o. male.  Patient with history of COPD and hypertension presenting with suspected chigger bites for the past 1 week.  States he was cutting down grass that was very high and was in a chigger infested area.  He does have diffuse itching across his back, trunk and buttocks for the past 1 week. He has been soaking in Epsom salts at home without relief.  He is also been using Benadryl. Denies any chest pain or difficulty breathing.  Trouble swallowing.  No fever.  States he had multiple tick bites as well but not recently.  States he had 181 tick bite since March.  The history is provided by the patient.  Rash Associated symptoms: no abdominal pain, no fever, no headaches, no joint pain, no myalgias, no nausea, no shortness of breath and not vomiting        Past Medical History:  Diagnosis Date  . Chronic pain   . Dizziness   . Headache   . Hypertension   . Stroke [facility]) 2000    There are no problems to display for this patient.   Past Surgical History:  Procedure Laterality Date  . TONSILLECTOMY  age 52       Family History  Problem Relation Age of Onset  . Stroke Father   . Heart failure Father     Social History   Tobacco Use  . Smoking status: Current Every Day Smoker    Packs/day: 2.00    Years: 27.00    Pack years: 54.00    Types: Cigarettes  . Smokeless tobacco: Never Used  Substance Use Topics  . Alcohol use: No  . Drug use: No    Home Medications Prior to Admission medications   Medication Sig Start Date End Date Taking? Authorizing Provider  albuterol (PROVENTIL HFA;VENTOLIN HFA) 108 (90 Base) MCG/ACT inhaler Inhale 1-2 puffs into the lungs every 6 (six) hours as needed for wheezing or shortness of breath. Patient taking differently: Inhale into the lungs  daily as needed for wheezing or shortness of breath.  01/23/19   Varney Biles, MD  alprazolam [name]) 2 MG tablet Take 2 mg by mouth 3 (three) times daily as needed.     [provider]  Aspirin-Acetaminophen (GOODYS BODY PAIN PO) Take 1 Package by mouth every 4 (four) hours as needed (pain).    [provider]  clindamycin (CLEOCIN) 300 MG capsule Take 1 capsule (300 mg total) by mouth 4 (four) times daily. X 7 days 02/22/20   Etta Quill, NP  oxyCODONE (OXY IR/ROXICODONE) 5 MG immediate release tablet Take 1 tablet (5 mg total) by mouth every 6 (six) hours as needed for severe pain or breakthrough pain. 10/27/19   [name], PA-C    Allergies    Codeine, Other, Penicillins, and Prednisone  Review of Systems   Review of Systems  Constitutional: Negative for activity change and fever.  HENT: Negative for congestion and rhinorrhea.   Respiratory: Negative for cough, chest tightness and shortness of breath.   Cardiovascular: Negative for chest pain and leg swelling.  Gastrointestinal: Negative for abdominal pain, nausea and vomiting.  Genitourinary: Negative for dysuria, hematuria, scrotal swelling and testicular pain.  Musculoskeletal: Negative for arthralgias and myalgias.  Skin: Positive for rash.  Neurological:  Negative for dizziness, weakness and headaches.   all other systems are negative except as noted in the HPI and PMH.    Physical Exam Updated Vital Signs BP 126/86 (BP Location: Right Arm)   Pulse 65   Temp 97.7 F (36.5 C) (Oral)   Resp 16   Ht 5\' 9"  (1.753 m)   Wt 104.3 kg   SpO2 97%   BMI 33.97 kg/m   Physical Exam Vitals and nursing note reviewed.  Constitutional:      General: He is not in acute distress.    Appearance: He is well-developed.  HENT:     Head: Normocephalic and atraumatic.     Ears:     Comments: No tongue or lip swelling    Mouth/Throat:     Pharynx: No oropharyngeal exudate.  Eyes:     Conjunctiva/sclera:  Conjunctivae normal.     Pupils: Pupils are equal, round, and reactive to light.  Neck:     Comments: No meningismus. Cardiovascular:     Rate and Rhythm: Normal rate and regular rhythm.     Heart sounds: Normal heart sounds. No murmur.  Pulmonary:     Effort: Pulmonary effort is normal. No respiratory distress.     Breath sounds: Normal breath sounds.  Abdominal:     Palpations: Abdomen is soft.     Tenderness: There is no abdominal tenderness. There is no guarding or rebound.  Musculoskeletal:        General: No tenderness. Normal range of motion.     Cervical back: Normal range of motion and neck supple.  Skin:    General: Skin is warm.     Findings: Rash present.     Comments: Scattered maculopapular rash involving back and buttocks with surrounding erythema.  No evidence of fluctuance.  Neurological:     Mental Status: He is alert and oriented to person, place, and time.     Cranial Nerves: No cranial nerve deficit.     Motor: No abnormal muscle tone.     Coordination: Coordination normal.     Comments: No ataxia on finger to nose bilaterally. No pronator drift. 5/5 strength throughout. CN 2-12 intact.Equal grip strength. Sensation intact.   Psychiatric:        Behavior: Behavior normal.     ED Results / Procedures / Treatments   Labs (all labs ordered are listed, but only abnormal results are displayed) Labs Reviewed - No data to display  EKG None  Radiology No results found.  Procedures Procedures (including critical care time)  Medications Ordered in ED Medications - No data to display  ED Course  I have reviewed the triage vital signs and the nursing notes.  Pertinent labs & imaging results that were available during my care of the patient were reviewed by me and considered in my medical decision making (see chart for details).    MDM Rules/Calculators/A&P                      Suspected chigger bites.  Difficulty breathing.  No tongue lip  swelling.  We will treat with antihistamines, topical steroids.  Prophylactic doxycycline given as well to prevent infection as well as due to recent tick bites.  Follow-up with PCP.  Return precautions discussed. Final Clinical Impression(s) / ED Diagnoses Final diagnoses:  Chigger bites    Rx / DC Orders ED Discharge Orders    None       Benito Lemmerman, , MD 05/19/20 0510

## 2020-05-19 NOTE — Discharge Instructions (Signed)
Take the steroids as prescribed and use the medicine as needed for itching.  Do not take the Atarax if you are working or driving.  Return to the ED with difficulty breathing, difficulty swallowing or any other concerns.

## 2020-06-17 DIAGNOSIS — I10 Essential (primary) hypertension: Secondary | ICD-10-CM | POA: Insufficient documentation

## 2020-06-17 DIAGNOSIS — Z720 Tobacco use: Secondary | ICD-10-CM | POA: Insufficient documentation

## 2020-06-17 DIAGNOSIS — R55 Syncope and collapse: Secondary | ICD-10-CM | POA: Insufficient documentation

## 2020-06-17 DIAGNOSIS — E785 Hyperlipidemia, unspecified: Secondary | ICD-10-CM | POA: Insufficient documentation

## 2020-06-17 DIAGNOSIS — R079 Chest pain, unspecified: Secondary | ICD-10-CM | POA: Insufficient documentation

## 2020-06-17 DIAGNOSIS — F419 Anxiety disorder, unspecified: Secondary | ICD-10-CM | POA: Insufficient documentation

## 2021-08-11 ENCOUNTER — Encounter (HOSPITAL_COMMUNITY): Payer: Self-pay

## 2021-08-11 ENCOUNTER — Emergency Department (HOSPITAL_COMMUNITY)
Admission: EM | Admit: 2021-08-11 | Discharge: 2021-08-11 | Disposition: A | Discharge status: Home / Self Care | Payer: Medicaid Other | Attending: Student | Admitting: Student

## 2021-08-11 ENCOUNTER — Other Ambulatory Visit: Payer: Self-pay

## 2021-08-11 DIAGNOSIS — Z20822 Contact with and (suspected) exposure to covid-19: Secondary | ICD-10-CM | POA: Insufficient documentation

## 2021-08-11 DIAGNOSIS — I10 Essential (primary) hypertension: Secondary | ICD-10-CM | POA: Insufficient documentation

## 2021-08-11 DIAGNOSIS — Z760 Encounter for issue of repeat prescription: Secondary | ICD-10-CM | POA: Insufficient documentation

## 2021-08-11 DIAGNOSIS — Z9189 Other specified personal risk factors, not elsewhere classified: Secondary | ICD-10-CM

## 2021-08-11 DIAGNOSIS — Z28310 Unvaccinated for covid-19: Secondary | ICD-10-CM | POA: Insufficient documentation

## 2021-08-11 DIAGNOSIS — Z79899 Other long term (current) drug therapy: Secondary | ICD-10-CM | POA: Insufficient documentation

## 2021-08-11 DIAGNOSIS — F17210 Nicotine dependence, cigarettes, uncomplicated: Secondary | ICD-10-CM | POA: Diagnosis not present

## 2021-08-11 MED ORDER — ATORVASTATIN CALCIUM 40 MG PO TABS: 40.0000 mg | ORAL_TABLET | Freq: Every day | ORAL | 0 refills | Status: DC

## 2021-08-11 NOTE — Discharge Instructions (Addendum)
We have screened you for COVID-19 today.  The exposure you described is low risk for you having this infection as it was an outdoor exposure and it sounds like you are far enough away that your risk was very limited.  However I do recommend that you stay home and avoid others while awaiting this test result which should return within 24 hours.  You will be called if your test is positive, you may also call here after tomorrow morning and we can review your test results if you have not heard from [place].  You have been given a refill of your Lipitor.  See the referrals for obtaining a primary MD.

## 2021-08-11 NOTE — ED Triage Notes (Signed)
Pt to er, pt states that he wants to get checked for covid, states that he hasn't been exposed and doesn't feel ill, but wants to get checked .

## 2021-08-11 NOTE — ED Provider Notes (Signed)
[facility] EMERGENCY DEPARTMENT Provider Note   CSN: [id number] Arrival date & time: 08/11/21  1016     History Chief Complaint  Patient presents with   Covid Exposure    Gary Harrington is a 60 y.o. male with a history including hypertension, CVA, hypercholesterolemia presenting requesting screening for COVID-19.  He denies any symptoms including fevers, cough, shortness of breath also no nasal congestion, rhinorrhea or other URI type symptoms.  He describes being exposed to a case manager who comes to his home for one of his children, although describes the contact as outdoors in the driveway by more than 6 feet, so risk of significant exposure is low.  Patient is not COVID vaccinated.  Additionally, he is currently looking for new primary provider and has run out of his Lipitor for the past 2 months and is desirous of a refill of this medication.  The history is provided by the patient.      Past Medical History:  Diagnosis Date   Chronic pain    Dizziness    Headache    Hypertension    Stroke (HCC) 2000    There are no problems to display for this patient.   Past Surgical History:  Procedure Laterality Date   TONSILLECTOMY  age 59       Family History  Problem Relation Age of Onset   Stroke Father    Heart failure Father     Social History   Tobacco Use   Smoking status: Every Day    Packs/day: 2.00    Years: 27.00    Pack years: 54.00    Types: Cigarettes   Smokeless tobacco: Never  Vaping Use   Vaping Use: Never used  Substance Use Topics   Alcohol use: No   Drug use: No    Home Medications Prior to Admission medications   Medication Sig Start Date End Date Taking? Authorizing Provider  atorvastatin (LIPITOR) 40 MG tablet Take 1 tablet (40 mg total) by mouth daily. 08/11/21  Yes Ethelbert Thain, Raynelle Fanning, PA-C  albuterol (PROVENTIL HFA;VENTOLIN HFA) 108 (90 Base) MCG/ACT inhaler Inhale 1-2 puffs into the lungs every 6 (six) hours as needed for wheezing or  shortness of breath. Patient taking differently: Inhale into the lungs daily as needed for wheezing or shortness of breath.  01/23/19   Derwood Kaplan, MD  alprazolam Prudy Feeler) 2 MG tablet Take 2 mg by mouth 3 (three) times daily as needed.     [provider]  Aspirin-Acetaminophen (GOODYS BODY PAIN PO) Take 1 Package by mouth every 4 (four) hours as needed (pain).    [provider]  clindamycin (CLEOCIN) 300 MG capsule Take 1 capsule (300 mg total) by mouth 4 (four) times daily. X 7 days 02/22/20   Felicie Morn, NP  doxycycline (VIBRAMYCIN) 100 MG capsule Take 1 capsule (100 mg total) by mouth 2 (two) times daily. 05/19/20   Rancour, Jeannett Senior, MD  hydrOXYzine (ATARAX/VISTARIL) 25 MG tablet Take 1 tablet (25 mg total) by mouth every 6 (six) hours as needed for itching. 05/19/20   Rancour, Jeannett Senior, MD  oxyCODONE (OXY IR/ROXICODONE) 5 MG immediate release tablet Take 1 tablet (5 mg total) by mouth every 6 (six) hours as needed for severe pain or breakthrough pain. 10/27/19   Donovan Kail, PA-C    Allergies    Codeine, Other, Penicillins, and Prednisone  Review of Systems   Review of Systems  Constitutional:  Negative for chills, diaphoresis and fever.  HENT:  Negative  for congestion, rhinorrhea and sore throat.   Eyes: Negative.   Respiratory:  Negative for chest tightness and shortness of breath.   Cardiovascular:  Negative for chest pain.  Gastrointestinal:  Negative for abdominal pain and nausea.  Genitourinary: Negative.   Musculoskeletal: Negative.   Skin: Negative.   Neurological: Negative.   Psychiatric/Behavioral: Negative.     Physical Exam Updated Vital Signs BP (!) 154/75 (BP Location: Right Arm)   Pulse 70   Temp 97.8 F (36.6 C) (Oral)   Resp 18   Ht 5\' 9"  (1.753 m)   Wt 104.3 kg   SpO2 99%   BMI 33.97 kg/m   Physical Exam Vitals and nursing note reviewed.  Constitutional:      Appearance: He is well-developed.  HENT:     Head: Normocephalic and  atraumatic.  Eyes:     Conjunctiva/sclera: Conjunctivae normal.  Cardiovascular:     Rate and Rhythm: Normal rate and regular rhythm.     Heart sounds: Normal heart sounds.  Pulmonary:     Effort: Pulmonary effort is normal. No respiratory distress.     Breath sounds: Normal breath sounds. No wheezing.  Musculoskeletal:        General: Normal range of motion.     Cervical back: Normal range of motion.  Skin:    General: Skin is warm and dry.  Neurological:     General: No focal deficit present.     Mental Status: He is alert and oriented to person, place, and time.     Gait: Gait normal.    ED Results / Procedures / Treatments   Labs (all labs ordered are listed, but only abnormal results are displayed) Labs Reviewed  SARS CORONAVIRUS 2 (TAT 6-24 HRS)    EKG None  Radiology No results found.  Procedures Procedures   Medications Ordered in ED Medications - No data to display  ED Course  I have reviewed the triage vital signs and the nursing notes.  Pertinent labs & imaging results that were available during my care of the patient were reviewed by me and considered in my medical decision making (see chart for details).    MDM Rules/Calculators/A&P                           Patient at low risk for having COVID-19 given the description of his exposure which was outdoors, less than 15 minutes and from a distance greater than 6 feet.  However he was screened today with our 24-hour COVID test.  He was encouraged to stay home in quarantine until the results of this is back and is negative.  He was also given a refill of his Lipitor.  Patient was given suggestions for obtaining a local PCP.  Gary Harrington was evaluated in Emergency Department on 08/11/2021 for the symptoms described in the history of present illness. He was evaluated in the context of the global COVID-19 pandemic, which necessitated consideration that the patient might be at risk for infection with the  SARS-CoV-2 virus that causes COVID-19. Institutional protocols and algorithms that pertain to the evaluation of patients at risk for COVID-19 are in a state of rapid change based on information released by regulatory bodies including the CDC and federal and state organizations. These policies and algorithms were followed during the patient's care in the ED.  Final Clinical Impression(s) / ED Diagnoses Final diagnoses:  At increased risk of exposure to COVID-19 virus  Medication refill    Rx / DC Orders ED Discharge Orders          Ordered    atorvastatin (LIPITOR) 40 MG tablet  Daily        08/11/21 1055             Burgess Amor, Cordelia Poche 08/11/21 1119    Kommor, [place], MD 08/11/21 1623

## 2021-08-12 ENCOUNTER — Ambulatory Visit: Payer: Medicaid Other | Admitting: Orthopedic Surgery

## 2021-08-12 ENCOUNTER — Encounter: Payer: Self-pay | Admitting: Orthopedic Surgery

## 2021-08-12 LAB — SARS CORONAVIRUS 2 (TAT 6-24 HRS): SARS Coronavirus 2: NEGATIVE

## 2021-10-06 ENCOUNTER — Emergency Department (HOSPITAL_COMMUNITY)
Admission: EM | Admit: 2021-10-06 | Discharge: 2021-10-06 | Disposition: A | Discharge status: Home / Self Care | Payer: Medicaid Other | Attending: Emergency Medicine | Admitting: Emergency Medicine

## 2021-10-06 ENCOUNTER — Encounter (HOSPITAL_COMMUNITY): Payer: Self-pay | Admitting: Emergency Medicine

## 2021-10-06 ENCOUNTER — Other Ambulatory Visit: Payer: Self-pay

## 2021-10-06 DIAGNOSIS — K029 Dental caries, unspecified: Secondary | ICD-10-CM | POA: Diagnosis not present

## 2021-10-06 DIAGNOSIS — K0381 Cracked tooth: Secondary | ICD-10-CM | POA: Diagnosis not present

## 2021-10-06 DIAGNOSIS — K0889 Other specified disorders of teeth and supporting structures: Secondary | ICD-10-CM | POA: Diagnosis present

## 2021-10-06 MED ORDER — CLINDAMYCIN HCL 300 MG PO CAPS: 300.0000 mg | ORAL_CAPSULE | Freq: Four times a day (QID) | ORAL | 0 refills | Status: DC

## 2021-10-06 MED ORDER — CLINDAMYCIN HCL 150 MG PO CAPS
300.0000 mg | ORAL_CAPSULE | Freq: Once | ORAL | Status: AC
  Administered 2021-10-06: 300 mg via ORAL
  Filled 2021-10-06: qty 2

## 2021-10-06 NOTE — ED Provider Notes (Signed)
[facility] EMERGENCY DEPARTMENT Provider Note   CSN: [id number] Arrival date & time: 10/06/21  0442     History Chief Complaint  Patient presents with   Dental Pain    Gary Harrington is a 60 y.o. male.  [name] with past medical history of hypertension, hyperlipidemia, and poor dentition.  Patient presenting today with complaints of dental pain.  He tells me he was eating pickles this past Tuesday when one of his lower teeth broke off.  He has been having pain since that time.  The history is provided by the patient.  Dental Pain Location:  Lower Quality:  Aching Severity:  Severe Onset quality:  Sudden Duration:  4 days Timing:  Constant Progression:  Worsening Chronicity:  New Context: dental fracture and poor dentition       Past Medical History:  Diagnosis Date   Chronic pain    Dizziness    Headache    Hypertension    Stroke [facility]) 2000    Patient Active Problem List   Diagnosis Date Noted   Anxiety disorder 06/17/2020   Chest pain 06/17/2020   Essential hypertension 06/17/2020   Hyperlipidemia 06/17/2020   Syncope 06/17/2020   Tobacco abuse 06/17/2020    Past Surgical History:  Procedure Laterality Date   TONSILLECTOMY  age 61       Family History  Problem Relation Age of Onset   Stroke Father    Heart failure Father     Social History   Tobacco Use   Smoking status: Every Day    Packs/day: 2.00    Years: 27.00    Pack years: 54.00    Types: Cigarettes   Smokeless tobacco: Never  Vaping Use   Vaping Use: Never used  Substance Use Topics   Alcohol use: No   Drug use: No    Home Medications Prior to Admission medications   Medication Sig Start Date End Date Taking? Authorizing Provider  albuterol (PROVENTIL HFA;VENTOLIN HFA) 108 (90 Base) MCG/ACT inhaler Inhale 1-2 puffs into the lungs every 6 (six) hours as needed for wheezing or shortness of breath. Patient taking differently: Inhale into the lungs  daily as needed for wheezing or shortness of breath.  01/23/19   Derwood Kaplan, MD  alprazolam Prudy Feeler) 2 MG tablet Take 2 mg by mouth 3 (three) times daily as needed.     [provider]  Aspirin-Acetaminophen (GOODYS BODY PAIN PO) Take 1 Package by mouth every 4 (four) hours as needed (pain).    [provider]  atorvastatin (LIPITOR) 40 MG tablet Take 1 tablet (40 mg total) by mouth daily. 08/11/21   Burgess Amor, PA-C  clindamycin (CLEOCIN) 300 MG capsule Take 1 capsule (300 mg total) by mouth 4 (four) times daily. X 7 days 02/22/20   Felicie Morn, NP  doxycycline (VIBRAMYCIN) 100 MG capsule Take 1 capsule (100 mg total) by mouth 2 (two) times daily. 05/19/20   Rancour, Jeannett Senior, MD  hydrOXYzine (ATARAX/VISTARIL) 25 MG tablet Take 1 tablet (25 mg total) by mouth every 6 (six) hours as needed for itching. 05/19/20   Rancour, Jeannett Senior, MD  oxyCODONE (OXY IR/ROXICODONE) 5 MG immediate release tablet Take 1 tablet (5 mg total) by mouth every 6 (six) hours as needed for severe pain or breakthrough pain. 10/27/19   Donovan Kail, PA-C    Allergies    Codeine, Other, Penicillins, and Prednisone  Review of Systems   Review of Systems  All other systems reviewed  and are negative.  Physical Exam Updated Vital Signs BP 125/83 (BP Location: Right Arm)   Pulse 68   Temp (!) 97.5 F (36.4 C) (Oral)   Resp 18   Ht 5\' 9"  (1.753 m)   Wt 113.4 kg   SpO2 97%   BMI 36.92 kg/m   Physical Exam Vitals and nursing note reviewed.  Constitutional:      General: He is not in acute distress.    Appearance: Normal appearance. He is not ill-appearing.  HENT:     Mouth/Throat:     Comments: Patient has poor dentition throughout.  Most of his teeth are black and broken off at the gum line.  He has 1 remaining lower tooth in the front and the tooth beside it appears to have recently fractured.  There is no evidence for abscess. Pulmonary:     Effort: Pulmonary effort is normal.  Neurological:      Mental Status: He is alert and oriented to person, place, and time.    ED Results / Procedures / Treatments   Labs (all labs ordered are listed, but only abnormal results are displayed) Labs Reviewed - No data to display  EKG None  Radiology No results found.  Procedures Procedures   Medications Ordered in ED Medications  clindamycin (CLEOCIN) capsule 300 mg (has no administration in time range)    ED Course  I have reviewed the triage vital signs and the nursing notes.  Pertinent labs & imaging results that were available during my care of the patient were reviewed by me and considered in my medical decision making (see chart for details).    MDM Rules/Calculators/A&P  Patient with pain from dental caries/fracture of a heavily decayed remaining tooth.  He will be treated with antibiotics and follow-up as needed.  Final Clinical Impression(s) / ED Diagnoses Final diagnoses:  None    Rx / DC Orders ED Discharge Orders     None        , MD 10/06/21 [phone]

## 2021-10-06 NOTE — Discharge Instructions (Signed)
Begin taking clindamycin as prescribed.  Follow-up with dentistry in the next few days.

## 2021-10-06 NOTE — ED Triage Notes (Signed)
Pt c/o toothache since breaking one of his front bottom tooth yesterday. Hx of dental problems.

## 2022-01-20 ENCOUNTER — Encounter (HOSPITAL_COMMUNITY): Payer: Self-pay | Admitting: *Deleted

## 2022-01-20 ENCOUNTER — Emergency Department (HOSPITAL_COMMUNITY): Payer: Medicaid Other

## 2022-01-20 ENCOUNTER — Emergency Department (HOSPITAL_COMMUNITY)
Admission: EM | Admit: 2022-01-20 | Discharge: 2022-01-20 | Disposition: A | Discharge status: Home / Self Care | Payer: Medicaid Other | Attending: Emergency Medicine | Admitting: Emergency Medicine

## 2022-01-20 ENCOUNTER — Other Ambulatory Visit: Payer: Self-pay

## 2022-01-20 DIAGNOSIS — R600 Localized edema: Secondary | ICD-10-CM | POA: Diagnosis not present

## 2022-01-20 DIAGNOSIS — R0989 Other specified symptoms and signs involving the circulatory and respiratory systems: Secondary | ICD-10-CM | POA: Insufficient documentation

## 2022-01-20 DIAGNOSIS — M25571 Pain in right ankle and joints of right foot: Secondary | ICD-10-CM | POA: Insufficient documentation

## 2022-01-20 DIAGNOSIS — M7989 Other specified soft tissue disorders: Secondary | ICD-10-CM | POA: Diagnosis present

## 2022-01-20 DIAGNOSIS — I10 Essential (primary) hypertension: Secondary | ICD-10-CM | POA: Diagnosis not present

## 2022-01-20 LAB — CBC WITH DIFFERENTIAL/PLATELET
Abs Immature Granulocytes: 0.05 10*3/uL (ref 0.00–0.07)
Basophils Absolute: 0.1 10*3/uL (ref 0.0–0.1)
Basophils Relative: 1 %
Eosinophils Absolute: 0.4 10*3/uL (ref 0.0–0.5)
Eosinophils Relative: 4 %
HCT: 39.4 % (ref 39.0–52.0)
Hemoglobin: 12.8 g/dL — ABNORMAL LOW (ref 13.0–17.0)
Immature Granulocytes: 1 %
Lymphocytes Relative: 28 %
Lymphs Abs: 2.7 10*3/uL (ref 0.7–4.0)
MCH: 22.2 pg — ABNORMAL LOW (ref 26.0–34.0)
MCHC: 32.5 g/dL (ref 30.0–36.0)
MCV: 68.3 fL — ABNORMAL LOW (ref 80.0–100.0)
Monocytes Absolute: 0.7 10*3/uL (ref 0.1–1.0)
Monocytes Relative: 7 %
Neutro Abs: 5.9 10*3/uL (ref 1.7–7.7)
Neutrophils Relative %: 59 %
Platelets: 245 10*3/uL (ref 150–400)
RBC: 5.77 MIL/uL (ref 4.22–5.81)
RDW: 15.9 % — ABNORMAL HIGH (ref 11.5–15.5)
WBC: 9.7 10*3/uL (ref 4.0–10.5)
nRBC: 0.2 % (ref 0.0–0.2)

## 2022-01-20 LAB — COMPREHENSIVE METABOLIC PANEL
ALT: 24 U/L (ref 0–44)
AST: 21 U/L (ref 15–41)
Albumin: 4.1 g/dL (ref 3.5–5.0)
Alkaline Phosphatase: 61 U/L (ref 38–126)
Anion gap: 6 (ref 5–15)
BUN: 12 mg/dL (ref 8–23)
CO2: 25 mmol/L (ref 22–32)
Calcium: 8.8 mg/dL — ABNORMAL LOW (ref 8.9–10.3)
Chloride: 105 mmol/L (ref 98–111)
Creatinine, Ser: 1.16 mg/dL (ref 0.61–1.24)
GFR, Estimated: >60 mL/min (ref >60)
Glucose, Bld: 98 mg/dL (ref 70–99)
Potassium: 3.9 mmol/L (ref 3.5–5.1)
Sodium: 136 mmol/L (ref 135–145)
Total Bilirubin: 0.7 mg/dL (ref 0.3–1.2)
Total Protein: 6.8 g/dL (ref 6.5–8.1)

## 2022-01-20 LAB — BRAIN NATRIURETIC PEPTIDE: B Natriuretic Peptide: 37 pg/mL (ref 0.0–100.0)

## 2022-01-20 MED ORDER — ATORVASTATIN CALCIUM 40 MG PO TABS: 40.0000 mg | ORAL_TABLET | Freq: Every day | ORAL | 0 refills | Status: DC

## 2022-01-20 NOTE — ED Triage Notes (Signed)
Pt here due to swelling to bilateral legs and feet for past 2 weeks.  Pain to bottom of feet with walking per pt. Pt states he has ran out of his cholesterol medication as well.

## 2022-01-20 NOTE — ED Provider Notes (Signed)
[facility] EMERGENCY DEPARTMENT Provider Note   CSN: [id number] Arrival date & time: 01/20/22  1707     History  Chief Complaint  Patient presents with   Leg Swelling    Gary Harrington is a 61 y.o. male.  HPI  Medical history including hypertension, hyperlipidemia tobacco use presents with complaints of bilateral leg swelling start about a week and a half ago states swelling is mainly in his feet, he has associated pain right his ankles and the bottoms of his feet, denies any trauma to the area, states he is unable to wear socks as they are too tight around him, no associated chest pain or shortness of breath no pleuritic chest pain denies orthopnea, no cardiac history, no history of PEs or DVTs currently on hormone therapy.  He also notes that he is running out of his Lipitor like a refill, he only has a few days left, he has no other complaints.  Home Medications Prior to Admission medications   Medication Sig Start Date End Date Taking? Authorizing Provider  albuterol (PROVENTIL HFA;VENTOLIN HFA) 108 (90 Base) MCG/ACT inhaler Inhale 1-2 puffs into the lungs every 6 (six) hours as needed for wheezing or shortness of breath. Patient taking differently: Inhale into the lungs daily as needed for wheezing or shortness of breath.  01/23/19   Derwood Kaplan, MD  alprazolam Prudy Feeler) 2 MG tablet Take 2 mg by mouth 3 (three) times daily as needed.     [provider]  Aspirin-Acetaminophen (GOODYS BODY PAIN PO) Take 1 Package by mouth every 4 (four) hours as needed (pain).    [provider]  atorvastatin (LIPITOR) 40 MG tablet Take 1 tablet (40 mg total) by mouth daily. 08/11/21   Burgess Amor, PA-C  clindamycin (CLEOCIN) 300 MG capsule Take 1 capsule (300 mg total) by mouth 4 (four) times daily. X 7 days 10/06/21   Geoffery Lyons, MD  doxycycline (VIBRAMYCIN) 100 MG capsule Take 1 capsule (100 mg total) by mouth 2 (two) times daily. 05/19/20   Rancour, Jeannett Senior, MD   hydrOXYzine (ATARAX/VISTARIL) 25 MG tablet Take 1 tablet (25 mg total) by mouth every 6 (six) hours as needed for itching. 05/19/20   Rancour, Jeannett Senior, MD  oxyCODONE (OXY IR/ROXICODONE) 5 MG immediate release tablet Take 1 tablet (5 mg total) by mouth every 6 (six) hours as needed for severe pain or breakthrough pain. 10/27/19   Donovan Kail, PA-C      Allergies    Codeine, Other, Penicillins, and Prednisone    Review of Systems   Review of Systems  Constitutional:  Negative for chills and fever.  Respiratory:  Negative for shortness of breath.   Cardiovascular:  Positive for leg swelling. Negative for chest pain.  Gastrointestinal:  Negative for abdominal pain.  Neurological:  Negative for headaches.   Physical Exam Updated Vital Signs BP 137/79 (BP Location: Right Arm)    Pulse 87    Temp 98.2 F (36.8 C) (Oral)    Resp 17    Ht 5\' 9"  (1.753 m)    Wt 107.5 kg    SpO2 97%    BMI 35.00 kg/m  Physical Exam Vitals and nursing note reviewed.  Constitutional:      General: He is not in acute distress.    Appearance: He is not ill-appearing.  HENT:     Head: Normocephalic and atraumatic.     Nose: No congestion.  Eyes:     Conjunctiva/sclera: Conjunctivae normal.  Cardiovascular:  Rate and Rhythm: Normal rate and regular rhythm.     Pulses: Normal pulses.     Heart sounds: No murmur heard.   No friction rub. No gallop.  Pulmonary:     Effort: No respiratory distress.     Breath sounds: Rales present. No wheezing or rhonchi.     Comments: Intermittent bibasilar Rales Abdominal:     Palpations: Abdomen is soft.     Tenderness: There is no abdominal tenderness. There is no right CVA tenderness or left CVA tenderness.  Musculoskeletal:     Right lower leg: Edema present.     Left lower leg: Edema present.     Comments: Lower extremities were visualized no unilateral leg swelling no chronic lymphedema skin changes present, full range of motion his toes ankle and knees,  neurovascular fully intact.  Very minimal pedal edema nonpitting.  No calf tenderness, no palpable cords  Skin:    General: Skin is warm and dry.  Neurological:     Mental Status: He is alert.  Psychiatric:        Mood and Affect: Mood normal.    ED Results / Procedures / Treatments   Labs (all labs ordered are listed, but only abnormal results are displayed) Labs Reviewed  COMPREHENSIVE METABOLIC PANEL  BRAIN NATRIURETIC PEPTIDE  CBC WITH DIFFERENTIAL/PLATELET    EKG None  Radiology No results found.  Procedures Procedures    Medications Ordered in ED Medications - No data to display  ED Course/ Medical Decision Making/ A&P                           Medical Decision Making Amount and/or Complexity of Data Reviewed Labs: ordered. Radiology: ordered.   This patient presents to the ED for concern of leg swelling and feet pain, this involves an extensive number of treatment options, and is a complaint that carries with it a high risk of complications and morbidity.  The differential diagnosis includes portal hypertension, cirrhosis, CKD, CHF, DVT, PE    Additional history obtained:  Additional history obtained from chart medical record External records from outside source obtained and reviewed including previous ED notes   Co morbidities that complicate the patient evaluation  Hypertension  Social Determinants of Health:  N/A    Lab Tests:  I Ordered, and personally interpreted labs.  The pertinent results include: CBC shows microcytic anemia hemoglobin 12.8, CMP is unremarkable, BMP unremarkable   Imaging Studies ordered:  I ordered imaging studies including chest x-ray I independently visualized and interpreted imaging which showed unremarkable I agree with the radiologist interpretation    Rule out Low suspicion for CHF exacerbation lung sounds were clear, BNP unremarkable chest x-ray does not reveal pleural effusions.  I have low suspicion  for worsening CKD as creatinine and BUN are within normal limits.  Low suspicion for portal hypertension as liver enzymes within normal limits no fluid wave.  I have low suspicion for PE denies pleuritic chest pain shortness of breath no urine or leg swelling vital signs reassuring nontachypneic nonhypoxic.  Low suspicion for DVT as he has no unilateral leg swelling no palpable cords presentation atypical etiology.  Low suspicion for ACS patient has chest pain, shortness of breath presentation atypical etiology.    Dispostion and problem list  After consideration of the diagnostic results and the patients response to treatment, I feel that the patent would benefit from discharge follow-up with DVT study.  Leg swelling-unclear etiology  differential diagnosis includes cirrhosis, portal hypertension, lymphedema, I have very low suspicion for DVT as be atypical to have simultaneous bilateral DVTs.  Will defer anticoagulant as risk outweigh benefits  will have him come back for an ultrasound tomorrow for further evaluation if negative follow with PCP.            Final Clinical Impression(s) / ED Diagnoses Final diagnoses:  None    Rx / DC Orders ED Discharge Orders     None         Marcello Fennel, PA-C 01/20/22 1856    Hayden Rasmussen, MD 01/21/22 1001

## 2022-01-20 NOTE — Discharge Instructions (Signed)
Lab work imaging reassuring would like to come back for a DVT study please call the number above to schedule an appointment  Please follow your PCP   Come back to the emergency department if you develop chest pain, shortness of breath, severe abdominal pain, uncontrolled nausea, vomiting, diarrhea.

## 2022-01-27 ENCOUNTER — Other Ambulatory Visit: Payer: Self-pay

## 2022-01-27 ENCOUNTER — Ambulatory Visit (HOSPITAL_COMMUNITY)
Admission: RE | Admit: 2022-01-27 | Discharge: 2022-01-27 | Disposition: A | Discharge status: Home / Self Care | Payer: Medicaid Other | Source: Ambulatory Visit | Attending: Student | Admitting: Student

## 2022-01-27 DIAGNOSIS — R600 Localized edema: Secondary | ICD-10-CM | POA: Insufficient documentation

## 2022-01-27 NOTE — ED Provider Notes (Signed)
Patient was seen 6 days ago for leg swelling, patient had a benign work-up lab work which was unremarkable, chest x-ray which was unremarkable, came back for a DVT study, DVT was reviewed it was unremarkable,  Patient was updated on results and explained that he should follow-up with his PCP if his symptoms continue,.  All questions were answered.  Patient was stable during my evaluation, ambulate without difficulty, having no complaints and agreeable  for discharge.      Marcello Fennel, PA-C 01/27/22 1433    Milton Ferguson, MD 01/28/22 [phone]

## 2022-06-20 ENCOUNTER — Encounter (HOSPITAL_COMMUNITY): Payer: Self-pay

## 2022-06-20 ENCOUNTER — Emergency Department (HOSPITAL_COMMUNITY): Payer: Medicaid Other

## 2022-06-20 ENCOUNTER — Emergency Department (HOSPITAL_COMMUNITY)
Admission: EM | Admit: 2022-06-20 | Discharge: 2022-06-20 | Disposition: A | Discharge status: Home / Self Care | Payer: Medicaid Other | Attending: Emergency Medicine | Admitting: Emergency Medicine

## 2022-06-20 ENCOUNTER — Other Ambulatory Visit: Payer: Self-pay

## 2022-06-20 DIAGNOSIS — D72829 Elevated white blood cell count, unspecified: Secondary | ICD-10-CM | POA: Insufficient documentation

## 2022-06-20 DIAGNOSIS — M79602 Pain in left arm: Secondary | ICD-10-CM | POA: Insufficient documentation

## 2022-06-20 DIAGNOSIS — R079 Chest pain, unspecified: Secondary | ICD-10-CM | POA: Insufficient documentation

## 2022-06-20 LAB — CBC
HCT: 37.7 % — ABNORMAL LOW (ref 39.0–52.0)
Hemoglobin: 12.3 g/dL — ABNORMAL LOW (ref 13.0–17.0)
MCH: 21.7 pg — ABNORMAL LOW (ref 26.0–34.0)
MCHC: 32.6 g/dL (ref 30.0–36.0)
MCV: 66.6 fL — ABNORMAL LOW (ref 80.0–100.0)
Platelets: 278 10*3/uL (ref 150–400)
RBC: 5.66 MIL/uL (ref 4.22–5.81)
RDW: 17.2 % — ABNORMAL HIGH (ref 11.5–15.5)
WBC: 11 10*3/uL — ABNORMAL HIGH (ref 4.0–10.5)
nRBC: 0 % (ref 0.0–0.2)

## 2022-06-20 LAB — BASIC METABOLIC PANEL
Anion gap: 6 (ref 5–15)
BUN: 8 mg/dL (ref 8–23)
CO2: 24 mmol/L (ref 22–32)
Calcium: 9.1 mg/dL (ref 8.9–10.3)
Chloride: 108 mmol/L (ref 98–111)
Creatinine, Ser: 0.85 mg/dL (ref 0.61–1.24)
GFR, Estimated: >60 mL/min (ref >60)
Glucose, Bld: 87 mg/dL (ref 70–99)
Potassium: 3.6 mmol/L (ref 3.5–5.1)
Sodium: 138 mmol/L (ref 135–145)

## 2022-06-20 LAB — TROPONIN I (HIGH SENSITIVITY): Troponin I (High Sensitivity): 3 ng/L (ref <18)

## 2022-06-20 MED ORDER — KETOROLAC TROMETHAMINE 15 MG/ML IJ SOLN
15.0000 mg | Freq: Once | INTRAMUSCULAR | Status: DC
  Filled 2022-06-20: qty 1

## 2022-06-20 MED ORDER — HYDROXYZINE HCL 25 MG PO TABS
25.0000 mg | ORAL_TABLET | Freq: Once | ORAL | Status: DC
  Filled 2022-06-20: qty 1

## 2022-06-20 MED ORDER — KETOROLAC TROMETHAMINE 15 MG/ML IJ SOLN
15.0000 mg | Freq: Once | INTRAMUSCULAR | Status: AC
  Administered 2022-06-20: 15 mg via INTRAMUSCULAR

## 2022-06-20 MED ORDER — ASPIRIN 81 MG PO CHEW
324.0000 mg | CHEWABLE_TABLET | Freq: Once | ORAL | Status: AC
  Administered 2022-06-20: 324 mg via ORAL
  Filled 2022-06-20: qty 4

## 2022-06-20 NOTE — ED Provider Notes (Signed)
[facility] EMERGENCY DEPARTMENT Provider Note   CSN: [id number] Arrival date & time: 06/20/22  1425     History {Add pertinent medical, surgical, social history, OB history to HPI:1} Chief Complaint  Patient presents with   Chest Pain    Gary Harrington is a 61 y.o. male.  He is here with complaint of pain in his left arm and into his chest that is been going on for 2 weeks.  Not associated with any shortness of breath cough dizziness lightheadedness.  Has tried nothing for it.  He states the pain is likely related to stress.  He does not have a primary care doctor.  He has a care plan in place for frequent visits although he states he has not been here in years.  This is his ninth visit this year.  The history is provided by the patient.  Chest Pain Pain location:  Substernal area Pain radiates to:  L arm Pain severity:  Severe Onset quality:  Gradual Duration:  2 weeks Timing:  Constant Progression:  Unchanged Chronicity:  New Relieved by:  None tried Worsened by:  Nothing Ineffective treatments:  None tried Associated symptoms: no abdominal pain, no cough, no diaphoresis, no nausea, no shortness of breath and no vomiting        Home Medications Prior to Admission medications   Medication Sig Start Date End Date Taking? Authorizing Provider  albuterol (PROVENTIL HFA;VENTOLIN HFA) 108 (90 Base) MCG/ACT inhaler Inhale 1-2 puffs into the lungs every 6 (six) hours as needed for wheezing or shortness of breath. Patient taking differently: Inhale into the lungs daily as needed for wheezing or shortness of breath. 01/23/19   Derwood Kaplan, MD  Aspirin-Acetaminophen (GOODYS BODY PAIN PO) Take 1 Package by mouth every 4 (four) hours as needed (pain).    [provider]  atorvastatin (LIPITOR) 40 MG tablet Take 1 tablet (40 mg total) by mouth daily. 01/20/22 02/19/22  Carroll Sage, PA-C  clindamycin (CLEOCIN) 300 MG capsule Take 1 capsule (300 mg total) by mouth 4  (four) times daily. X 7 days Patient not taking: Reported on 01/20/2022 10/06/21   Geoffery Lyons, MD  doxycycline (VIBRAMYCIN) 100 MG capsule Take 1 capsule (100 mg total) by mouth 2 (two) times daily. Patient not taking: Reported on 01/20/2022 05/19/20   Glynn Octave, MD  hydrOXYzine (ATARAX/VISTARIL) 25 MG tablet Take 1 tablet (25 mg total) by mouth every 6 (six) hours as needed for itching. 05/19/20   Rancour, Jeannett Senior, MD  oxyCODONE (OXY IR/ROXICODONE) 5 MG immediate release tablet Take 1 tablet (5 mg total) by mouth every 6 (six) hours as needed for severe pain or breakthrough pain. Patient not taking: Reported on 01/20/2022 10/27/19   Donovan Kail, PA-C  Vitamin D, Ergocalciferol, (DRISDOL) 1.25 MG (50000 UNIT) CAPS capsule Take 50,000 Units by mouth once a week. 10/31/21   [provider]      Allergies    Codeine, Other, Penicillins, and Prednisone    Review of Systems   Review of Systems  Constitutional:  Negative for diaphoresis.  Respiratory:  Negative for cough and shortness of breath.   Cardiovascular:  Positive for chest pain.  Gastrointestinal:  Negative for abdominal pain, nausea and vomiting.    Physical Exam Updated Vital Signs BP 129/83   Pulse 79   Temp 97.8 F (36.6 C) (Oral)   Resp 18   Ht 5\' 9"  (1.753 m)   Wt 96.4 kg   SpO2 99%   BMI  31.40 kg/m  Physical Exam Vitals and nursing note reviewed.  Constitutional:      General: He is not in acute distress.    Appearance: Normal appearance. He is well-developed.  HENT:     Head: Normocephalic and atraumatic.  Eyes:     Conjunctiva/sclera: Conjunctivae normal.  Cardiovascular:     Rate and Rhythm: Normal rate and regular rhythm.     Heart sounds: Normal heart sounds. No murmur heard. Pulmonary:     Effort: Pulmonary effort is normal. No respiratory distress.     Breath sounds: Normal breath sounds.  Abdominal:     Palpations: Abdomen is soft.     Tenderness: There is no abdominal  tenderness. There is no guarding or rebound.  Musculoskeletal:        General: No swelling.     Cervical back: Neck supple.     Right lower leg: No edema.     Left lower leg: No edema.  Skin:    General: Skin is warm and dry.     Capillary Refill: Capillary refill takes less than 2 seconds.  Neurological:     Mental Status: He is alert. Mental status is at baseline.     ED Results / Procedures / Treatments   Labs (all labs ordered are listed, but only abnormal results are displayed) Labs Reviewed  BASIC METABOLIC PANEL  CBC  TROPONIN I (HIGH SENSITIVITY)    EKG EKG Interpretation  Date/Time:  Friday June 20 2022 14:49:38 EDT Ventricular Rate:  78 PR Interval:  176 QRS Duration: 86 QT Interval:  378 QTC Calculation: 431 R Axis:   41 Text Interpretation: Sinus rhythm No significant change since prior 4/20 Confirmed by Meridee Score [phone]) on 06/20/2022 3:06:38 PM  Radiology No results found.  Procedures Procedures  {Document cardiac monitor, telemetry assessment procedure when appropriate:1}  Medications Ordered in ED Medications - No data to display  ED Course/ Medical Decision Making/ A&P                           Medical Decision Making Amount and/or Complexity of Data Reviewed Labs: ordered. Radiology: ordered.  This patient complains of ***; this involves an extensive number of treatment Options and is a complaint that carries with it a high risk of complications and morbidity. The differential includes ***  I ordered, reviewed and interpreted labs, which included *** I ordered medication *** and reviewed PMP when indicated. I ordered imaging studies which included *** and I independently    visualized and interpreted imaging which showed *** Additional history obtained from *** Previous records obtained and reviewed *** I consulted *** and discussed lab and imaging findings and discussed disposition.  Cardiac monitoring reviewed, *** Social  determinants considered, *** Critical Interventions: ***  After the interventions stated above, I reevaluated the patient and found *** Admission and further testing considered, ***    {Document critical care time when appropriate:1} {Document review of labs and clinical decision tools ie heart score, Chads2Vasc2 etc:1}  {Document your independent review of radiology images, and any outside records:1} {Document your discussion with family members, caretakers, and with consultants:1} {Document social determinants of health affecting pt's care:1} {Document your decision making why or why not admission, treatments were needed:1} Final Clinical Impression(s) / ED Diagnoses Final diagnoses:  None    Rx / DC Orders ED Discharge Orders     None

## 2022-06-20 NOTE — Discharge Instructions (Signed)
You were seen in the emergency department for 2 weeks of chest and shoulder pain.  You had lab work EKG chest x-ray left shoulder x-ray that did not show any obvious explanation for your symptoms.  Will be important for you to get a primary care doctor to help with your care.  Please return to the emergency department if any worsening or concerning symptoms.

## 2022-06-20 NOTE — ED Triage Notes (Signed)
Pt presents to ED with complaints of chest pressure, left arm pain x 2 weeks. Pt states he has been under a lot of stress x 5 months and thinks "stress caught up to me"

## 2022-10-10 ENCOUNTER — Other Ambulatory Visit: Payer: Self-pay

## 2022-10-10 ENCOUNTER — Encounter (HOSPITAL_COMMUNITY): Payer: Self-pay | Admitting: Emergency Medicine

## 2022-10-10 ENCOUNTER — Emergency Department (HOSPITAL_COMMUNITY)
Admission: EM | Admit: 2022-10-10 | Discharge: 2022-10-10 | Disposition: A | Discharge status: Home / Self Care | Payer: Medicaid Other | Attending: Emergency Medicine | Admitting: Emergency Medicine

## 2022-10-10 DIAGNOSIS — K0889 Other specified disorders of teeth and supporting structures: Secondary | ICD-10-CM | POA: Insufficient documentation

## 2022-10-10 MED ORDER — CLINDAMYCIN HCL 300 MG PO CAPS: 300.0000 mg | ORAL_CAPSULE | Freq: Three times a day (TID) | ORAL | 0 refills | Status: DC

## 2022-10-10 MED ORDER — CLINDAMYCIN HCL 150 MG PO CAPS
300.0000 mg | ORAL_CAPSULE | Freq: Once | ORAL | Status: AC
  Administered 2022-10-10: 300 mg via ORAL
  Filled 2022-10-10: qty 2

## 2022-10-10 NOTE — ED Triage Notes (Signed)
Patient c/o right lower dental pain since this am.

## 2022-10-10 NOTE — ED Provider Notes (Signed)
[facility] EMERGENCY DEPARTMENT Provider Note   CSN: [id number] Arrival date & time: 10/10/22  1743     History  Chief Complaint  Patient presents with   Dental Pain    Gary Harrington is a 60 y.o. male.  Patient complains of dental pain.  Patient reports he has not seen a dentist in 68 years.  Patient is aware that he has bad teeth.  Patient reports he has an area that is more painful than usual and he is concerned that the area is swollen and that he might have an abscess  The history is provided by the patient. No language interpreter was used.  Dental Pain Context: poor dentition   Context: not abscess   Relieved by:  Nothing Worsened by:  Nothing      Home Medications Prior to Admission medications   Medication Sig Start Date End Date Taking? Authorizing Provider  clindamycin (CLEOCIN) 300 MG capsule Take 1 capsule (300 mg total) by mouth 3 (three) times daily. 10/10/22  Yes Fransico Meadow, PA-C  Aspirin-Acetaminophen (GOODYS BODY PAIN PO) Take 1 Package by mouth every 4 (four) hours as needed (pain).    [provider]  atorvastatin (LIPITOR) 40 MG tablet Take 1 tablet (40 mg total) by mouth daily. 01/20/22 02/19/22  Marcello Fennel, PA-C  hydrOXYzine (ATARAX/VISTARIL) 25 MG tablet Take 1 tablet (25 mg total) by mouth every 6 (six) hours as needed for itching. 05/19/20   Rancour, Annie Main, MD  Vitamin D, Ergocalciferol, (DRISDOL) 1.25 MG (50000 UNIT) CAPS capsule Take 50,000 Units by mouth once a week. 10/31/21   [provider]      Allergies    Codeine, Other, Penicillins, and Prednisone    Review of Systems   Review of Systems  All other systems reviewed and are negative.   Physical Exam Updated Vital Signs BP 134/84 (BP Location: Right Arm)   Pulse 68   Temp 98.1 F (36.7 C) (Oral)   Resp 16   Ht 5\' 9"  (1.753 m)   Wt 99.8 kg   SpO2 100%   BMI 32.49 kg/m  Physical Exam Vitals and nursing note reviewed.  Constitutional:       Appearance: He is well-developed.  HENT:     Head: Normocephalic.     Mouth/Throat:     Comments: Severe dental decay no obvious abscess no sign of Ludwick's no lymphadenopathy Cardiovascular:     Rate and Rhythm: Normal rate.  Pulmonary:     Effort: Pulmonary effort is normal.  Abdominal:     General: There is no distension.  Musculoskeletal:        General: Normal range of motion.     Cervical back: Normal range of motion.  Skin:    General: Skin is warm.  Neurological:     General: No focal deficit present.     Mental Status: He is alert and oriented to person, place, and time.  Psychiatric:        Mood and Affect: Mood normal.     ED Results / Procedures / Treatments   Labs (all labs ordered are listed, but only abnormal results are displayed) Labs Reviewed - No data to display  EKG None  Radiology No results found.  Procedures Procedures    Medications Ordered in ED Medications  clindamycin (CLEOCIN) capsule 300 mg (has no administration in time range)    ED Course/ Medical Decision Making/ A&P  Medical Decision Making Risk Prescription drug management.           Final Clinical Impression(s) / ED Diagnoses Final diagnoses:  Pain, dental    Rx / DC Orders ED Discharge Orders          Ordered    clindamycin (CLEOCIN) 300 MG capsule  3 times daily        10/10/22 1905           An After Visit Summary was printed and given to the patient.    Fransico Meadow, [place] 10/10/22 1911    Cristie Hem, MD 10/11/22 [phone]

## 2022-12-07 ENCOUNTER — Emergency Department (HOSPITAL_COMMUNITY)
Admission: EM | Admit: 2022-12-07 | Discharge: 2022-12-07 | Disposition: A | Discharge status: Home / Self Care | Payer: Medicaid Other | Attending: Emergency Medicine | Admitting: Emergency Medicine

## 2022-12-07 ENCOUNTER — Emergency Department (HOSPITAL_COMMUNITY): Payer: Medicaid Other

## 2022-12-07 ENCOUNTER — Telehealth (HOSPITAL_COMMUNITY): Payer: Self-pay | Admitting: Emergency Medicine

## 2022-12-07 ENCOUNTER — Encounter (HOSPITAL_COMMUNITY): Payer: Self-pay

## 2022-12-07 ENCOUNTER — Other Ambulatory Visit: Payer: Self-pay

## 2022-12-07 DIAGNOSIS — R0981 Nasal congestion: Secondary | ICD-10-CM | POA: Insufficient documentation

## 2022-12-07 DIAGNOSIS — R519 Headache, unspecified: Secondary | ICD-10-CM | POA: Insufficient documentation

## 2022-12-07 DIAGNOSIS — Z7982 Long term (current) use of aspirin: Secondary | ICD-10-CM | POA: Diagnosis not present

## 2022-12-07 DIAGNOSIS — Z20822 Contact with and (suspected) exposure to covid-19: Secondary | ICD-10-CM | POA: Insufficient documentation

## 2022-12-07 DIAGNOSIS — F17210 Nicotine dependence, cigarettes, uncomplicated: Secondary | ICD-10-CM | POA: Diagnosis not present

## 2022-12-07 DIAGNOSIS — R059 Cough, unspecified: Secondary | ICD-10-CM | POA: Diagnosis not present

## 2022-12-07 DIAGNOSIS — R509 Fever, unspecified: Secondary | ICD-10-CM | POA: Insufficient documentation

## 2022-12-07 DIAGNOSIS — J3489 Other specified disorders of nose and nasal sinuses: Secondary | ICD-10-CM | POA: Diagnosis not present

## 2022-12-07 LAB — RESP PANEL BY RT-PCR (RSV, FLU A&B, COVID)  RVPGX2
Influenza A by PCR: NEGATIVE
Influenza B by PCR: NEGATIVE
Resp Syncytial Virus by PCR: NEGATIVE
SARS Coronavirus 2 by RT PCR: NEGATIVE

## 2022-12-07 MED ORDER — DOXYCYCLINE HYCLATE 100 MG PO CAPS: 100.0000 mg | ORAL_CAPSULE | Freq: Two times a day (BID) | ORAL | 0 refills | Status: DC

## 2022-12-07 MED ORDER — ACETAMINOPHEN 500 MG PO TABS
1000.0000 mg | ORAL_TABLET | Freq: Once | ORAL | Status: AC
  Administered 2022-12-07: 1000 mg via ORAL
  Filled 2022-12-07: qty 2

## 2022-12-07 MED ORDER — ALBUTEROL SULFATE HFA 108 (90 BASE) MCG/ACT IN AERS
2.0000 | INHALATION_SPRAY | Freq: Once | RESPIRATORY_TRACT | Status: AC
  Administered 2022-12-07: 2 via RESPIRATORY_TRACT
  Filled 2022-12-07: qty 6.7

## 2022-12-07 MED ORDER — DEXAMETHASONE 10 MG/ML FOR PEDIATRIC ORAL USE
10.0000 mg | Freq: Once | INTRAMUSCULAR | Status: AC
  Administered 2022-12-07: 10 mg via ORAL
  Filled 2022-12-07: qty 1

## 2022-12-07 MED ORDER — DOXYCYCLINE HYCLATE 100 MG PO TABS
100.0000 mg | ORAL_TABLET | Freq: Once | ORAL | Status: AC
Start: 2022-12-07 — End: 2022-12-07
  Administered 2022-12-07: 100 mg via ORAL
  Filled 2022-12-07: qty 1

## 2022-12-07 NOTE — ED Provider Notes (Signed)
[facility] EMERGENCY DEPARTMENT Provider Note   CSN: [id number] Arrival date & time: 12/07/22  0207     History  Chief Complaint  Patient presents with   Headache    Gary Harrington is a 61 y.o. male.  61 year old male with 3 weeks of worsening sinus congestion, sinus headache, nasal drainage and intermittent fevers with cough.  No known sick contacts but his immune system is likely down secondary to caring for multiple family members and high stress of multiple people close to her diet and her last couple months.  Smokes about half pack cigarettes a day.   Headache      Home Medications Prior to Admission medications   Medication Sig Start Date End Date Taking? Authorizing Provider  doxycycline (VIBRAMYCIN) 100 MG capsule Take 1 capsule (100 mg total) by mouth 2 (two) times daily. One po bid x 7 days 12/07/22  Yes Romell Cavanah, Barbara Cower, MD  Aspirin-Acetaminophen (GOODYS BODY PAIN PO) Take 1 Package by mouth every 4 (four) hours as needed (pain).    [provider]  atorvastatin (LIPITOR) 40 MG tablet Take 1 tablet (40 mg total) by mouth daily. 01/20/22 02/19/22  Carroll Sage, PA-C  clindamycin (CLEOCIN) 300 MG capsule Take 1 capsule (300 mg total) by mouth 3 (three) times daily. 10/10/22   Elson Areas, PA-C  hydrOXYzine (ATARAX/VISTARIL) 25 MG tablet Take 1 tablet (25 mg total) by mouth every 6 (six) hours as needed for itching. 05/19/20   Rancour, Jeannett Senior, MD  Vitamin D, Ergocalciferol, (DRISDOL) 1.25 MG (50000 UNIT) CAPS capsule Take 50,000 Units by mouth once a week. 10/31/21   [provider]      Allergies    Codeine, Other, Penicillins, and Prednisone    Review of Systems   Review of Systems  Neurological:  Positive for headaches.    Physical Exam Updated Vital Signs BP 111/76   Pulse 65   Temp 98.1 F (36.7 C) (Oral)   Resp 18   Ht 5\' 9"  (1.753 m)   Wt 95.3 kg   SpO2 94%   BMI 31.01 kg/m  Physical Exam Vitals and nursing note  reviewed.  Constitutional:      Appearance: He is well-developed.  HENT:     Head: Normocephalic and atraumatic.  Cardiovascular:     Rate and Rhythm: Normal rate.  Pulmonary:     Effort: Pulmonary effort is normal. No respiratory distress.  Abdominal:     General: There is no distension.  Musculoskeletal:        General: Normal range of motion.     Cervical back: Normal range of motion.  Skin:    General: Skin is warm and dry.  Neurological:     Mental Status: He is alert.     ED Results / Procedures / Treatments   Labs (all labs ordered are listed, but only abnormal results are displayed) Labs Reviewed  RESP PANEL BY RT-PCR (RSV, FLU A&B, COVID)  [id number]    EKG None  Radiology DG Chest 2 View  Result Date: 12/07/2022 CLINICAL DATA:  Cough and congestion with wheezing at night. EXAM: CHEST - 2 VIEW COMPARISON:  PA Lat 06/20/2022. FINDINGS: The heart size and mediastinal contours are within normal limits. Both lungs are mildly hyperexpanded but clear. The visualized skeletal structures are intact, with chronic healed fractures of the lateral left ribcage. There is thoracic kyphosis. Osteopenia. IMPRESSION: No active cardiopulmonary disease. Electronically Signed   By: 06/22/2022 M.D.   On:  12/07/2022 03:57    Procedures Procedures    Medications Ordered in ED Medications  albuterol (VENTOLIN HFA) 108 (90 Base) MCG/ACT inhaler 2 puff (2 puffs Inhalation Given 12/07/22 0345)  acetaminophen (TYLENOL) tablet 1,000 mg (1,000 mg Oral Given 12/07/22 0344)  doxycycline (VIBRA-TABS) tablet 100 mg (100 mg Oral Given 12/07/22 0516)  dexamethasone (DECADRON) 10 MG/ML injection for Pediatric ORAL use 10 mg (10 mg Oral Given 12/07/22 0516)    ED Course/ Medical Decision Making/ A&P                           Medical Decision Making Amount and/or Complexity of Data Reviewed Radiology: ordered.  Risk OTC drugs. Prescription drug management.   Likely sinusitis.  Rest of  workup reassuring.  Patient feels better after treatments here. Will DC on antibiotics.  PCP follow-up if not proving.   Final Clinical Impression(s) / ED Diagnoses Final diagnoses:  Sinus headache    Rx / DC Orders ED Discharge Orders          Ordered    doxycycline (VIBRAMYCIN) 100 MG capsule  2 times daily        12/07/22 0504              Alizaya Oshea, Barbara Cower, MD 12/07/22 [phone]

## 2022-12-07 NOTE — ED Triage Notes (Signed)
Pt arrived via POV c/o "head congestion" X 2 weeks, and wheezing at night. Pt reports trying to reduce the number of cigarettes he smokes and has been trying OTC medication for symptoms w/o relief.

## 2022-12-07 NOTE — ED Notes (Signed)
Pt requested doxycycline be sent to [facility] [address] in [place].  Notified Raynelle Fanning [place].

## 2022-12-07 NOTE — Telephone Encounter (Signed)
Patient was seen here yesterday evening diagnosed with sinus pain and prescribed doxycycline.  He calls today requesting his [facility] to the [facility] [address] in [place] as his pharmacy is not currently open.  Doxy cycling [facility] E scrib[facility].   Nursing staff contacted patient with this information.

## 2023-02-15 ENCOUNTER — Emergency Department (HOSPITAL_COMMUNITY)
Admission: EM | Admit: 2023-02-15 | Discharge: 2023-02-15 | Disposition: A | Discharge status: Home / Self Care | Payer: Medicaid Other | Attending: Student | Admitting: Student

## 2023-02-15 ENCOUNTER — Other Ambulatory Visit: Payer: Self-pay

## 2023-02-15 ENCOUNTER — Emergency Department (HOSPITAL_COMMUNITY): Payer: Medicaid Other

## 2023-02-15 DIAGNOSIS — Z8673 Personal history of transient ischemic attack (TIA), and cerebral infarction without residual deficits: Secondary | ICD-10-CM | POA: Insufficient documentation

## 2023-02-15 DIAGNOSIS — Z4800 Encounter for change or removal of nonsurgical wound dressing: Secondary | ICD-10-CM | POA: Diagnosis not present

## 2023-02-15 DIAGNOSIS — Z5189 Encounter for other specified aftercare: Secondary | ICD-10-CM

## 2023-02-15 DIAGNOSIS — Z7982 Long term (current) use of aspirin: Secondary | ICD-10-CM | POA: Insufficient documentation

## 2023-02-15 DIAGNOSIS — Z79899 Other long term (current) drug therapy: Secondary | ICD-10-CM | POA: Diagnosis not present

## 2023-02-15 DIAGNOSIS — I10 Essential (primary) hypertension: Secondary | ICD-10-CM | POA: Diagnosis not present

## 2023-02-15 DIAGNOSIS — L989 Disorder of the skin and subcutaneous tissue, unspecified: Secondary | ICD-10-CM | POA: Insufficient documentation

## 2023-02-15 DIAGNOSIS — F17210 Nicotine dependence, cigarettes, uncomplicated: Secondary | ICD-10-CM | POA: Diagnosis not present

## 2023-02-15 DIAGNOSIS — M79604 Pain in right leg: Secondary | ICD-10-CM | POA: Diagnosis present

## 2023-02-15 MED ORDER — KETOROLAC TROMETHAMINE 15 MG/ML IJ SOLN
15.0000 mg | Freq: Once | INTRAMUSCULAR | Status: AC
  Administered 2023-02-15: 15 mg via INTRAMUSCULAR
  Filled 2023-02-15: qty 1

## 2023-02-15 MED ORDER — NAPROXEN 375 MG PO TABS: 375.0000 mg | ORAL_TABLET | Freq: Two times a day (BID) | ORAL | 0 refills | Status: DC

## 2023-02-15 NOTE — ED Provider Notes (Signed)
[facility] Provider Note  CSN: [id number] Arrival date & time: 02/15/23 0445  Chief Complaint(s) Leg Pain  HPI Gary Harrington is a 62 y.o. male with PMH CVA, HTN who presents emergency room for evaluation of right leg pain.  2 weeks ago the patient slipped on the bed of his truck and suffered a large abrasion to the right lower extremity.  He has been trying multiple home remedies for the wound including peroxide, Neosporin and spraining the wound with "happy Barnabas Lister" antifungal spray for dogs.  He states that the pain is starting to get out of control and has been trying Tylenol at home without relief.  Patient arrives with what appears to be a healing wound over the right tib-fib with multiple excoriations and fleabites on the lower extremities.  He states that the fleas are from his dog that recently was hit by a car.   Past Medical History Past Medical History:  Diagnosis Date   Chronic pain    Dizziness    Headache    Hypertension    Stroke [facility]) 2000   Patient Active Problem List   Diagnosis Date Noted   Anxiety disorder 06/17/2020   Chest pain 06/17/2020   Essential hypertension 06/17/2020   Hyperlipidemia 06/17/2020   Syncope 06/17/2020   Tobacco abuse 06/17/2020   Home Medication(s) Prior to Admission medications   Medication Sig Start Date End Date Taking? Authorizing Provider  naproxen (NAPROSYN) 375 MG tablet Take 1 tablet (375 mg total) by mouth 2 (two) times daily. 02/15/23  Yes Anjelique Makar, MD  Aspirin-Acetaminophen (GOODYS BODY PAIN PO) Take 1 Package by mouth every 4 (four) hours as needed (pain).    [provider]  atorvastatin (LIPITOR) 40 MG tablet Take 1 tablet (40 mg total) by mouth daily. 01/20/22 02/19/22  Marcello Fennel, PA-C  clindamycin (CLEOCIN) 300 MG capsule Take 1 capsule (300 mg total) by mouth 3 (three) times daily. 10/10/22   Fransico Meadow, PA-C  doxycycline (VIBRAMYCIN) 100 MG capsule  Take 1 capsule (100 mg total) by mouth 2 (two) times daily. 12/07/22   Evalee Jefferson, PA-C  hydrOXYzine (ATARAX/VISTARIL) 25 MG tablet Take 1 tablet (25 mg total) by mouth every 6 (six) hours as needed for itching. 05/19/20   Rancour, Annie Main, MD  Vitamin D, Ergocalciferol, (DRISDOL) 1.25 MG (50000 UNIT) CAPS capsule Take 50,000 Units by mouth once a week. 10/31/21   [provider]                                                                                                                                    Past Surgical History Past Surgical History:  Procedure Laterality Date   TONSILLECTOMY  age 38   Family History Family History  Problem Relation Age of Onset   Stroke Father    Heart failure Father     Social History Social History  Tobacco Use   Smoking status: Every Day    Packs/day: 3.00    Years: 27.00    Total pack years: 81.00    Types: Cigarettes   Smokeless tobacco: Never  Vaping Use   Vaping Use: Never used  Substance Use Topics   Alcohol use: No   Drug use: No   Allergies Codeine, Other, Penicillins, and Prednisone  Review of Systems Review of Systems  Musculoskeletal:  Positive for arthralgias.  Skin:  Positive for wound.    Physical Exam Vital Signs  I have reviewed the triage vital signs BP 129/77 (BP Location: Left Arm)   Pulse 71   Temp 97.6 F (36.4 C) (Oral)   Resp 18   Ht '5\' 9"'$  (1.753 m)   Wt 95 kg   SpO2 99%   BMI 30.93 kg/m   Physical Exam Vitals and nursing note reviewed.  Constitutional:      General: He is not in acute distress.    Appearance: He is well-developed.  HENT:     Head: Normocephalic and atraumatic.  Eyes:     Conjunctiva/sclera: Conjunctivae normal.  Cardiovascular:     Rate and Rhythm: Normal rate and regular rhythm.     Heart sounds: No murmur heard. Pulmonary:     Effort: Pulmonary effort is normal. No respiratory distress.     Breath sounds: Normal breath sounds.  Abdominal:     Palpations:  Abdomen is soft.     Tenderness: There is no abdominal tenderness.  Musculoskeletal:        General: No swelling.     Cervical back: Neck supple.  Skin:    General: Skin is warm and dry.     Capillary Refill: Capillary refill takes less than 2 seconds.     Findings: Lesion present.  Neurological:     Mental Status: He is alert.  Psychiatric:        Mood and Affect: Mood normal.     ED Results and Treatments Labs (all labs ordered are listed, but only abnormal results are displayed) Labs Reviewed - No data to display                                                                                                                        Radiology DG Tibia/Fibula Right  Result Date: 02/15/2023 CLINICAL DATA:  Right lower extremity infection. EXAM: RIGHT TIBIA AND FIBULA - 2 VIEW COMPARISON:  None Available. FINDINGS: There is no evidence of fracture or other focal bone lesions. There is mild foreleg edema greater laterally. Dorsal and plantar calcaneal heel spurs are incidentally seen. IMPRESSION: Soft tissue edema. No evidence of fracture or other focal bone lesion. Electronically Signed   By: Telford Nab M.D.   On: 02/15/2023 05:38    Pertinent labs & imaging results that were available during my care of the patient were reviewed by me and considered in my medical decision making (see MDM for details).  Medications Ordered in ED Medications  ketorolac (TORADOL) 15 MG/ML injection 15 mg (15 mg Intramuscular Given 02/15/23 0523)                                                                                                                                     Procedures Procedures  (including critical care time)  Medical Decision Making / ED Course   This patient presents to the ED for concern of leg pain, this involves an extensive number of treatment options, and is a complaint that carries with it a high risk of complications and morbidity.  The differential diagnosis includes  gravity dependent edema, reactive edema, appropriate wound healing, wound infection, fracture  MDM: Patient seen emerged part for evaluation of right lower extremity pain.  Physical exam with an appropriately healing, granulating wound over the right tib-fib.  X-ray without fracture.  Patient pain controlled with Toradol and wound dressed with Xeroform.  Wound does appear to be healing appropriately with no evidence of infection but patient was cautioned against the use of animal products for medical use.  Patient then discharged with outpatient follow-up   Additional history obtained:  -External records from outside source obtained and reviewed including: Chart review including previous notes, labs, imaging, consultation notes     Imaging Studies ordered: I ordered imaging studies including x-ray tib-fib I independently visualized and interpreted imaging. I agree with the radiologist interpretation   Medicines ordered and prescription drug management: Meds ordered this encounter  Medications   ketorolac (TORADOL) 15 MG/ML injection 15 mg   naproxen (NAPROSYN) 375 MG tablet    Sig: Take 1 tablet (375 mg total) by mouth 2 (two) times daily.    Dispense:  20 tablet    Refill:  0    -I have reviewed the patients home medicines and have made adjustments as needed  Critical interventions none   Cardiac Monitoring: The patient was maintained on a cardiac monitor.  I personally viewed and interpreted the cardiac monitored which showed an underlying rhythm of: NSR  Social Determinants of Health:  Factors impacting patients care include: none   Reevaluation: After the interventions noted above, I reevaluated the patient and found that they have :improved  Co morbidities that complicate the patient evaluation  Past Medical History:  Diagnosis Date   Chronic pain    Dizziness    Headache    Hypertension    Stroke [facility]) 2000      Dispostion: I considered admission for  this patient, but at this time he does not meet inpatient criteria for admission and he is safe for discharge with outpatient follow-up     Final Clinical Impression(s) / ED Diagnoses Final diagnoses:  Visit for wound check     '@PCDICTATION'$ @    Teressa Lower, MD 02/15/23 [phone]

## 2023-02-15 NOTE — ED Notes (Signed)
ED Provider at bedside. 

## 2023-02-15 NOTE — ED Triage Notes (Signed)
Pt hit his right leg when getting into his truck two weeks ago. States the pain hasn't gotten better. Abrasion noted to right lower leg. Pt ambulatory in triage.

## 2023-03-27 ENCOUNTER — Emergency Department (HOSPITAL_COMMUNITY)
Admission: EM | Admit: 2023-03-27 | Discharge: 2023-03-27 | Disposition: A | Discharge status: Home / Self Care | Payer: Medicaid Other | Attending: Student | Admitting: Student

## 2023-03-27 ENCOUNTER — Other Ambulatory Visit: Payer: Self-pay

## 2023-03-27 ENCOUNTER — Encounter (HOSPITAL_COMMUNITY): Payer: Self-pay | Admitting: *Deleted

## 2023-03-27 DIAGNOSIS — Z8673 Personal history of transient ischemic attack (TIA), and cerebral infarction without residual deficits: Secondary | ICD-10-CM | POA: Insufficient documentation

## 2023-03-27 DIAGNOSIS — Z4800 Encounter for change or removal of nonsurgical wound dressing: Secondary | ICD-10-CM | POA: Insufficient documentation

## 2023-03-27 DIAGNOSIS — F17210 Nicotine dependence, cigarettes, uncomplicated: Secondary | ICD-10-CM | POA: Diagnosis not present

## 2023-03-27 DIAGNOSIS — I10 Essential (primary) hypertension: Secondary | ICD-10-CM | POA: Diagnosis not present

## 2023-03-27 DIAGNOSIS — Z5189 Encounter for other specified aftercare: Secondary | ICD-10-CM

## 2023-03-27 MED ORDER — KETOROLAC TROMETHAMINE 15 MG/ML IJ SOLN
15.0000 mg | Freq: Once | INTRAMUSCULAR | Status: AC
  Administered 2023-03-27: 15 mg via INTRAMUSCULAR
  Filled 2023-03-27: qty 1

## 2023-03-27 MED ORDER — SULFAMETHOXAZOLE-TRIMETHOPRIM 800-160 MG PO TABS: 1.0000 | ORAL_TABLET | Freq: Two times a day (BID) | ORAL | 0 refills | Status: AC

## 2023-03-27 MED ORDER — NAPROXEN 375 MG PO TABS
375.0000 mg | ORAL_TABLET | Freq: Two times a day (BID) | ORAL | 0 refills | Status: DC
Start: 2023-03-27 — End: 2023-04-17

## 2023-03-27 NOTE — ED Provider Notes (Signed)
[place] EMERGENCY DEPARTMENT AT [facility] Provider Note  CSN: [id number] Arrival date & time: 03/27/23 1039  Chief Complaint(s) Wound Check  HPI Gary Harrington is a 62 y.o. male with PMH CVA, HTN, chronic pain who presents emergency department for evaluation of a leg wound.  I personally saw this patient on 02/15/2023 for the same wound and it does appear improved today.  Patient was previously putting a antifungal spray for dogs on the wound but states that he is no longer doing this.  There does appear to be persistent blue staining over the wound that he states is staining from the old application.  He states that he has been soaking the wound in Epsom salts which causes a significant amount of pain.  He does have follow-up with his outpatient PCP in a few weeks and has run out of wound supplies.  He denies associated nausea, vomiting, chest pain, shortness of breath, headache, fever or other systemic symptoms.   Past Medical History Past Medical History:  Diagnosis Date   Chronic pain    Dizziness    Headache    Hypertension    Stroke 2000   Patient Active Problem List   Diagnosis Date Noted   Anxiety disorder 06/17/2020   Chest pain 06/17/2020   Essential hypertension 06/17/2020   Hyperlipidemia 06/17/2020   Syncope 06/17/2020   Tobacco abuse 06/17/2020   Home Medication(s) Prior to Admission medications   Medication Sig Start Date End Date Taking? Authorizing Provider  Aspirin-Acetaminophen (GOODYS BODY PAIN PO) Take 1 Package by mouth every 4 (four) hours as needed (pain).    [provider]  atorvastatin (LIPITOR) 40 MG tablet Take 1 tablet (40 mg total) by mouth daily. 01/20/22 02/19/22  Carroll Sage, PA-C  clindamycin (CLEOCIN) 300 MG capsule Take 1 capsule (300 mg total) by mouth 3 (three) times daily. 10/10/22   Elson Areas, PA-C  doxycycline (VIBRAMYCIN) 100 MG capsule Take 1 capsule (100 mg total) by mouth 2 (two) times daily. 12/07/22    Burgess Amor, PA-C  hydrOXYzine (ATARAX/VISTARIL) 25 MG tablet Take 1 tablet (25 mg total) by mouth every 6 (six) hours as needed for itching. 05/19/20   Rancour, Jeannett Senior, MD  naproxen (NAPROSYN) 375 MG tablet Take 1 tablet (375 mg total) by mouth 2 (two) times daily. 02/15/23   Lourdes Manning, MD  Vitamin D, Ergocalciferol, (DRISDOL) 1.25 MG (50000 UNIT) CAPS capsule Take 50,000 Units by mouth once a week. 10/31/21   [provider]                                                                                                                                    Past Surgical History Past Surgical History:  Procedure Laterality Date   TONSILLECTOMY  age 6   Family History Family History  Problem Relation Age of Onset   Stroke Father    Heart failure Father  Social History Social History   Tobacco Use   Smoking status: Every Day    Packs/day: 3.00    Years: 27.00    Additional pack years: 0.00    Total pack years: 81.00    Types: Cigarettes   Smokeless tobacco: Never  Vaping Use   Vaping Use: Never used  Substance Use Topics   Alcohol use: No   Drug use: No   Allergies Codeine, Other, Penicillins, and Prednisone  Review of Systems Review of Systems  Skin:  Positive for wound.    Physical Exam Vital Signs  I have reviewed the triage vital signs BP (!) 150/81 (BP Location: Right Arm)   Pulse 84   Temp (!) 97.4 F (36.3 C) (Temporal)   Resp 18   Ht 5\' 9"  (1.753 m)   Wt 99.8 kg   SpO2 100%   BMI 32.49 kg/m   Physical Exam Vitals and nursing note reviewed.  Constitutional:      General: He is not in acute distress.    Appearance: He is well-developed.  HENT:     Head: Normocephalic and atraumatic.  Eyes:     Conjunctiva/sclera: Conjunctivae normal.  Cardiovascular:     Rate and Rhythm: Normal rate and regular rhythm.     Heart sounds: No murmur heard. Pulmonary:     Effort: Pulmonary effort is normal. No respiratory distress.     Breath  sounds: Normal breath sounds.  Abdominal:     Palpations: Abdomen is soft.     Tenderness: There is no abdominal tenderness.  Musculoskeletal:        General: No swelling.     Cervical back: Neck supple.  Skin:    General: Skin is warm and dry.     Capillary Refill: Capillary refill takes less than 2 seconds.     Findings: Lesion present.  Neurological:     Mental Status: He is alert.  Psychiatric:        Mood and Affect: Mood normal.     ED Results and Treatments Labs (all labs ordered are listed, but only abnormal results are displayed) Labs Reviewed - No data to display                                                                                                                        Radiology No results found.  Pertinent labs & imaging results that were available during my care of the patient were reviewed by me and considered in my medical decision making (see MDM for details).  Medications Ordered in ED Medications  ketorolac (TORADOL) 15 MG/ML injection 15 mg (has no administration in time range)  Procedures Procedures  (including critical care time)  Medical Decision Making / ED Course   This patient presents to the ED for concern of wound check, this involves an extensive number of treatment options, and is a complaint that carries with it a high risk of complications and morbidity.  The differential diagnosis includes wound infection, impaired wound healing secondary to Epson salt use, appropriate granulation tissue, excoriation, osteomyelitis, cellulitis  MDM: Patient seen emergency room for evaluation of a wound check.  Physical exam reveals a 5 cm area of erythema consistent with the wound seen on 02/15/2023 but overall healing well with only the most distal aspect of the wound open and appropriately granulating.  No surrounding  erythema or streaking seen.  Patient pain controlled with Toradol and wound redressed.  Patient was cautioned against the use of Epsom salt as this caustic material is likely impairing wound healing and is certainly causing pain.  Patient has follow-up outpatient with his PCP and I did provide the patient with a written prescription for Duricef to fill only if he starts to develop fever, worsening erythema, purulence or any other signs of infection.  Patient then discharged with outpatient follow-up.   Additional history obtained:  -External records from outside source obtained and reviewed including: Chart review including previous notes, labs, imaging, consultation notes    Medicines ordered and prescription drug management: Meds ordered this encounter  Medications   ketorolac (TORADOL) 15 MG/ML injection 15 mg    -I have reviewed the patients home medicines and have made adjustments as needed  Critical interventions none     Cardiac Monitoring: The patient was maintained on a cardiac monitor.  I personally viewed and interpreted the cardiac monitored which showed an underlying rhythm of: NSR  Social Determinants of Health:  Factors impacting patients care include: Homeless   Reevaluation: After the interventions noted above, I reevaluated the patient and found that they have :improved  Co morbidities that complicate the patient evaluation  Past Medical History:  Diagnosis Date   Chronic pain    Dizziness    Headache    Hypertension    Stroke 2000      Dispostion: I considered admission for this patient, but at this time, with wound improving, patient safe for discharge with outpatient follow-up     Final Clinical Impression(s) / ED Diagnoses Final diagnoses:  None     @PCDICTATION @    Glendora ScoreKommor, Shontae Rosiles, MD 03/27/23 2145

## 2023-03-27 NOTE — ED Triage Notes (Signed)
Pt with redness to wound to right lower leg.  Pt states he was here for same last month for same. Pt states he has been soaking in epsom salt and using Neosporin .

## 2023-04-01 ENCOUNTER — Other Ambulatory Visit: Payer: Self-pay

## 2023-04-01 ENCOUNTER — Emergency Department (HOSPITAL_COMMUNITY)
Admission: EM | Admit: 2023-04-01 | Discharge: 2023-04-01 | Disposition: A | Discharge status: Home / Self Care | Payer: Medicaid Other | Attending: Emergency Medicine | Admitting: Emergency Medicine

## 2023-04-01 ENCOUNTER — Encounter (HOSPITAL_COMMUNITY): Payer: Self-pay | Admitting: Emergency Medicine

## 2023-04-01 DIAGNOSIS — I10 Essential (primary) hypertension: Secondary | ICD-10-CM | POA: Insufficient documentation

## 2023-04-01 DIAGNOSIS — L500 Allergic urticaria: Secondary | ICD-10-CM | POA: Insufficient documentation

## 2023-04-01 DIAGNOSIS — L509 Urticaria, unspecified: Secondary | ICD-10-CM

## 2023-04-01 DIAGNOSIS — T7840XA Allergy, unspecified, initial encounter: Secondary | ICD-10-CM | POA: Diagnosis present

## 2023-04-01 MED ORDER — FAMOTIDINE 20 MG PO TABS
20.0000 mg | ORAL_TABLET | Freq: Once | ORAL | Status: AC
  Administered 2023-04-01: 20 mg via ORAL
  Filled 2023-04-01: qty 1

## 2023-04-01 MED ORDER — DEXAMETHASONE SODIUM PHOSPHATE 10 MG/ML IJ SOLN
8.0000 mg | Freq: Once | INTRAMUSCULAR | Status: AC
  Administered 2023-04-01: 8 mg via INTRAMUSCULAR
  Filled 2023-04-01: qty 1

## 2023-04-01 MED ORDER — DIPHENHYDRAMINE HCL 25 MG PO CAPS
25.0000 mg | ORAL_CAPSULE | Freq: Once | ORAL | Status: AC
  Administered 2023-04-01: 25 mg via ORAL
  Filled 2023-04-01: qty 1

## 2023-04-01 NOTE — ED Provider Notes (Signed)
[place] EMERGENCY DEPARTMENT AT [facility] Provider Note   CSN: [id number] Arrival date & time: 04/01/23  1507     History  Chief Complaint  Patient presents with   Allergic Reaction    Gary Harrington is a 62 y.o. male with medical history of hypertension, headache, dizziness, chronic pain.  Patient presents to ED for evaluation of allergic reaction, right lower extremity wound.  Patient reports that yesterday he consumed [place] dressing began having diffuse hives to his bilateral upper and lower extremities.  Patient reports remote history of allergies to [place] dressing when he was 11.  The patient reports that he took Benadryl this morning which did alleviate his itching as well as make him very sleepy.  The patient denies any feelings of throat closure, history of anaphylaxis to french dressing, nausea, vomiting, abdominal pain, diarrhea or fevers.   Allergic Reaction Presenting symptoms: rash   Presenting symptoms: no difficulty swallowing        Home Medications Prior to Admission medications   Medication Sig Start Date End Date Taking? Authorizing Provider  Aspirin-Acetaminophen (GOODYS BODY PAIN PO) Take 1 Package by mouth every 4 (four) hours as needed (pain).    [provider]  atorvastatin (LIPITOR) 40 MG tablet Take 1 tablet (40 mg total) by mouth daily. 01/20/22 02/19/22  Carroll SageFaulkner, William J, PA-C  hydrOXYzine (ATARAX/VISTARIL) 25 MG tablet Take 1 tablet (25 mg total) by mouth every 6 (six) hours as needed for itching. 05/19/20   Rancour, Jeannett SeniorStephen, MD  naproxen (NAPROSYN) 375 MG tablet Take 1 tablet (375 mg total) by mouth 2 (two) times daily. 02/15/23   Kommor, Madison, MD  naproxen (NAPROSYN) 375 MG tablet Take 1 tablet (375 mg total) by mouth 2 (two) times daily. 03/27/23   Kommor, Madison, MD  sulfamethoxazole-trimethoprim (BACTRIM DS) 800-160 MG tablet Take 1 tablet by mouth 2 (two) times daily for 7 days. 03/27/23 04/03/23  Kommor, Madison, MD   Vitamin D, Ergocalciferol, (DRISDOL) 1.25 MG (50000 UNIT) CAPS capsule Take 50,000 Units by mouth once a week. 10/31/21   [provider]      Allergies    Codeine, Other, Penicillins, and Prednisone    Review of Systems   Review of Systems  HENT:  Negative for drooling, sore throat, trouble swallowing and voice change.   Respiratory:  Negative for shortness of breath.   Gastrointestinal:  Negative for nausea and vomiting.  Musculoskeletal:  Positive for myalgias.  Skin:  Positive for rash.  All other systems reviewed and are negative.   Physical Exam Updated Vital Signs BP 128/79 (BP Location: Right Arm)   Pulse 83   Temp 98.1 F (36.7 C) (Oral)   Resp 16   Wt 99.7 kg   SpO2 95%   BMI 32.46 kg/m  Physical Exam Vitals and nursing note reviewed.  Constitutional:      General: He is not in acute distress.    Appearance: Normal appearance. He is not ill-appearing, toxic-appearing or diaphoretic.  HENT:     Head: Normocephalic and atraumatic.     Nose: Nose normal. No congestion.     Mouth/Throat:     Mouth: Mucous membranes are moist.     Pharynx: Oropharynx is clear.  Eyes:     Extraocular Movements: Extraocular movements intact.     Conjunctiva/sclera: Conjunctivae normal.     Pupils: Pupils are equal, round, and reactive to light.  Cardiovascular:     Rate and Rhythm: Normal rate and regular rhythm.  Pulmonary:     Effort: Pulmonary effort is normal.     Breath sounds: Normal breath sounds. No wheezing.  Abdominal:     General: Abdomen is flat. Bowel sounds are normal.     Palpations: Abdomen is soft.     Tenderness: There is no abdominal tenderness.  Musculoskeletal:     Cervical back: Normal range of motion and neck supple. No tenderness.  Skin:    General: Skin is warm and dry.     Capillary Refill: Capillary refill takes less than 2 seconds.     Findings: Rash present.     Comments: Diffuse urticaria to bilateral upper extremities, lower  extremities.  There is also a red erythematous wound to the patient right lower extremity which appears to be healing well, not acutely infected  Neurological:     Mental Status: He is alert and oriented to person, place, and time.     ED Results / Procedures / Treatments   Labs (all labs ordered are listed, but only abnormal results are displayed) Labs Reviewed - No data to display  EKG None  Radiology No results found.  Procedures Procedures   Medications Ordered in ED Medications  famotidine (PEPCID) tablet 20 mg (20 mg Oral Given 04/01/23 1653)  dexamethasone (DECADRON) injection 8 mg (8 mg Intramuscular Given 04/01/23 1653)  diphenhydrAMINE (BENADRYL) capsule 25 mg (25 mg Oral Given 04/01/23 1653)    ED Course/ Medical Decision Making/ A&P  Medical Decision Making  62 year old male presents to ED for evaluation of allergic reaction ongoing for the last 24 hours.  Please see HPI for further details.  On examination patient afebrile and nontachycardic.  Lung sounds are clear bilaterally, not hypoxic.  Abdomen soft and compressible throughout.  Patient does have diffuse urticaria to bilateral upper and lower extremities.  Airway intact, no drooling, no change in phonation.  Uvula midline.  Patient denies feelings of throat closure.  Due to this, will not provide epinephrine.  Patient provided 25 mg Benadryl, 20 mg famotidine, 8 mg Decadron.  On reassessment patient reports feeling better.  Patient states that his hives have resolved.  Patient continues to state that he has no feelings of throat closure.  Patient also requesting wound change to wound of right lower extremity which she has been seen here for before.  The patient wound was redressed and he was provided with supplies to care for the wound at home.  Patient reports that he has appointment with PCP for follow-up in 2 days.  Patient encouraged to keep this appointment.  Patient provided return precautions and he voiced  understanding.  Patient all of his questions answered to satisfaction.  Patient stable for discharge.   Final Clinical Impression(s) / ED Diagnoses Final diagnoses:  Urticaria    Rx / DC Orders ED Discharge Orders     None         Clent Ridges 04/01/23 Darlys Gales, MD 04/03/23 1152

## 2023-04-01 NOTE — ED Triage Notes (Signed)
Pt complains of hives to face and arms after eating [place] dressing yesterday. Pt states last have french dressing when he was 62 years old and was allergic to it at that time. Pt complains of itching. No SOB. Took Benadryl this morning with no relief from itching. Pt also requesting to have right leg dressing changed.

## 2023-04-01 NOTE — Discharge Instructions (Signed)
Return to the ED with any new or worsening signs or symptoms such as feelings of throat closure, return of hives, nausea or vomiting Please follow-up with PCP for reevaluation in the next 3 days Please read attached guide concerning hives Please avoid consumption of [place] dressing in the future Please continue wrapping wound on right lower extremity with supplies provided to you today

## 2023-04-12 ENCOUNTER — Emergency Department (HOSPITAL_COMMUNITY): Payer: Medicaid Other

## 2023-04-12 ENCOUNTER — Other Ambulatory Visit: Payer: Self-pay

## 2023-04-12 ENCOUNTER — Encounter (HOSPITAL_COMMUNITY): Payer: Self-pay

## 2023-04-12 ENCOUNTER — Emergency Department (HOSPITAL_COMMUNITY)
Admission: EM | Admit: 2023-04-12 | Discharge: 2023-04-12 | Disposition: A | Discharge status: Home / Self Care | Payer: Medicaid Other | Attending: Student | Admitting: Student

## 2023-04-12 DIAGNOSIS — K4091 Unilateral inguinal hernia, without obstruction or gangrene, recurrent: Secondary | ICD-10-CM | POA: Insufficient documentation

## 2023-04-12 DIAGNOSIS — J449 Chronic obstructive pulmonary disease, unspecified: Secondary | ICD-10-CM | POA: Diagnosis not present

## 2023-04-12 DIAGNOSIS — S81802A Unspecified open wound, left lower leg, initial encounter: Secondary | ICD-10-CM | POA: Diagnosis not present

## 2023-04-12 DIAGNOSIS — I10 Essential (primary) hypertension: Secondary | ICD-10-CM | POA: Diagnosis not present

## 2023-04-12 DIAGNOSIS — X58XXXA Exposure to other specified factors, initial encounter: Secondary | ICD-10-CM | POA: Diagnosis not present

## 2023-04-12 DIAGNOSIS — R21 Rash and other nonspecific skin eruption: Secondary | ICD-10-CM | POA: Insufficient documentation

## 2023-04-12 DIAGNOSIS — S8992XA Unspecified injury of left lower leg, initial encounter: Secondary | ICD-10-CM | POA: Diagnosis present

## 2023-04-12 DIAGNOSIS — F17210 Nicotine dependence, cigarettes, uncomplicated: Secondary | ICD-10-CM | POA: Diagnosis not present

## 2023-04-12 DIAGNOSIS — S81801A Unspecified open wound, right lower leg, initial encounter: Secondary | ICD-10-CM | POA: Insufficient documentation

## 2023-04-12 DIAGNOSIS — N5089 Other specified disorders of the male genital organs: Secondary | ICD-10-CM | POA: Insufficient documentation

## 2023-04-12 HISTORY — DX: Chronic obstructive pulmonary disease, unspecified: J44.9

## 2023-04-12 MED ORDER — CEPHALEXIN 500 MG PO CAPS
500.0000 mg | ORAL_CAPSULE | Freq: Once | ORAL | Status: AC
  Administered 2023-04-12: 500 mg via ORAL
  Filled 2023-04-12: qty 1

## 2023-04-12 MED ORDER — KETOROLAC TROMETHAMINE 15 MG/ML IJ SOLN
15.0000 mg | Freq: Once | INTRAMUSCULAR | Status: AC
  Administered 2023-04-12: 15 mg via INTRAMUSCULAR
  Filled 2023-04-12: qty 1

## 2023-04-12 MED ORDER — SULFAMETHOXAZOLE-TRIMETHOPRIM 800-160 MG PO TABS: 1.0000 | ORAL_TABLET | Freq: Two times a day (BID) | ORAL | 0 refills | Status: DC

## 2023-04-12 NOTE — ED Triage Notes (Signed)
Pt states he has chigger bites on both legs that appeared after working in the year.

## 2023-04-12 NOTE — ED Notes (Signed)
Legs cleanse with NS and wrapped with xeroform and gauze. Applied ace bandage

## 2023-04-13 ENCOUNTER — Encounter: Payer: Self-pay | Admitting: *Deleted

## 2023-04-13 NOTE — Telephone Encounter (Signed)
This encounter was created in error - please disregard.
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
## 2023-04-14 ENCOUNTER — Telehealth: Payer: Self-pay | Admitting: *Deleted

## 2023-04-14 ENCOUNTER — Other Ambulatory Visit: Payer: Self-pay

## 2023-04-14 ENCOUNTER — Encounter (HOSPITAL_COMMUNITY): Payer: Self-pay

## 2023-04-14 ENCOUNTER — Emergency Department (HOSPITAL_COMMUNITY)
Admission: EM | Admit: 2023-04-14 | Discharge: 2023-04-14 | Disposition: A | Discharge status: Home / Self Care | Payer: Medicaid Other | Attending: Emergency Medicine | Admitting: Emergency Medicine

## 2023-04-14 DIAGNOSIS — K4090 Unilateral inguinal hernia, without obstruction or gangrene, not specified as recurrent: Secondary | ICD-10-CM | POA: Insufficient documentation

## 2023-04-14 DIAGNOSIS — J449 Chronic obstructive pulmonary disease, unspecified: Secondary | ICD-10-CM | POA: Diagnosis not present

## 2023-04-14 DIAGNOSIS — I10 Essential (primary) hypertension: Secondary | ICD-10-CM | POA: Diagnosis not present

## 2023-04-14 DIAGNOSIS — Z7982 Long term (current) use of aspirin: Secondary | ICD-10-CM | POA: Insufficient documentation

## 2023-04-14 DIAGNOSIS — Z79899 Other long term (current) drug therapy: Secondary | ICD-10-CM | POA: Diagnosis not present

## 2023-04-14 DIAGNOSIS — R1030 Lower abdominal pain, unspecified: Secondary | ICD-10-CM | POA: Diagnosis present

## 2023-04-14 LAB — CBC WITH DIFFERENTIAL/PLATELET
Abs Immature Granulocytes: 0.04 10*3/uL (ref 0.00–0.07)
Basophils Absolute: 0.1 10*3/uL (ref 0.0–0.1)
Basophils Relative: 1 %
Eosinophils Absolute: 0.5 10*3/uL (ref 0.0–0.5)
Eosinophils Relative: 5 %
HCT: 39.3 % (ref 39.0–52.0)
Hemoglobin: 12.2 g/dL — ABNORMAL LOW (ref 13.0–17.0)
Immature Granulocytes: 0 %
Lymphocytes Relative: 19 %
Lymphs Abs: 1.7 10*3/uL (ref 0.7–4.0)
MCH: 21.2 pg — ABNORMAL LOW (ref 26.0–34.0)
MCHC: 31 g/dL (ref 30.0–36.0)
MCV: 68.2 fL — ABNORMAL LOW (ref 80.0–100.0)
Monocytes Absolute: 0.7 10*3/uL (ref 0.1–1.0)
Monocytes Relative: 7 %
Neutro Abs: 6.2 10*3/uL (ref 1.7–7.7)
Neutrophils Relative %: 68 %
Platelets: 291 10*3/uL (ref 150–400)
RBC: 5.76 MIL/uL (ref 4.22–5.81)
RDW: 15.5 % (ref 11.5–15.5)
WBC: 9.1 10*3/uL (ref 4.0–10.5)
nRBC: 0 % (ref 0.0–0.2)

## 2023-04-14 LAB — COMPREHENSIVE METABOLIC PANEL
ALT: 19 U/L (ref 0–44)
AST: 20 U/L (ref 15–41)
Albumin: 4 g/dL (ref 3.5–5.0)
Alkaline Phosphatase: 58 U/L (ref 38–126)
Anion gap: 8 (ref 5–15)
BUN: 12 mg/dL (ref 8–23)
CO2: 24 mmol/L (ref 22–32)
Calcium: 8.8 mg/dL — ABNORMAL LOW (ref 8.9–10.3)
Chloride: 103 mmol/L (ref 98–111)
Creatinine, Ser: 0.82 mg/dL (ref 0.61–1.24)
GFR, Estimated: >60 mL/min (ref >60)
Glucose, Bld: 94 mg/dL (ref 70–99)
Potassium: 3.9 mmol/L (ref 3.5–5.1)
Sodium: 135 mmol/L (ref 135–145)
Total Bilirubin: 0.9 mg/dL (ref 0.3–1.2)
Total Protein: 7 g/dL (ref 6.5–8.1)

## 2023-04-14 MED ORDER — ONDANSETRON HCL 4 MG/2ML IJ SOLN
4.0000 mg | Freq: Once | INTRAMUSCULAR | Status: DC
  Filled 2023-04-14: qty 2

## 2023-04-14 MED ORDER — HYDROMORPHONE HCL 1 MG/ML IJ SOLN
0.5000 mg | Freq: Once | INTRAMUSCULAR | Status: DC
  Filled 2023-04-14: qty 0.5

## 2023-04-14 NOTE — Telephone Encounter (Signed)
Received call from patient son with patient in background of call.   Patient son reports that patient is having severe pain and is rolling around on the floor. States that patient is taking oxycodone 5 mg with no relief.   Advised patient son to take him to ER for evaluation of severe pain.

## 2023-04-14 NOTE — ED Triage Notes (Signed)
Pt comes in with a hernia in his scrotum. Patient states he is having sever pain and does not see Dr. Henreitta Leber in the office until Thursday. Patient states being seen for this on 04/12/23. Patient states he called the office and they informed the patient to come to the hospital for revaluation.

## 2023-04-14 NOTE — Discharge Instructions (Signed)
Keep your upcoming appointment with Dr. Henreitta Leber for this week.

## 2023-04-16 ENCOUNTER — Ambulatory Visit (INDEPENDENT_AMBULATORY_CARE_PROVIDER_SITE_OTHER): Payer: Medicaid Other | Admitting: General Surgery

## 2023-04-16 ENCOUNTER — Encounter: Payer: Self-pay | Admitting: General Surgery

## 2023-04-16 VITALS — BP 116/76 | HR 75 | Temp 97.9°F | Resp 16 | Ht 69.0 in | Wt 211.0 lb

## 2023-04-16 DIAGNOSIS — K4030 Unilateral inguinal hernia, with obstruction, without gangrene, not specified as recurrent: Secondary | ICD-10-CM

## 2023-04-16 MED ORDER — HYDROCODONE-ACETAMINOPHEN 5-325 MG PO TABS: 1.0000 | ORAL_TABLET | Freq: Four times a day (QID) | ORAL | 0 refills | Status: DC | PRN

## 2023-04-16 NOTE — Patient Instructions (Signed)
Stop smoking. Will discuss the hernia and how to best approach it with colleagues.
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
## 2023-04-16 NOTE — ED Provider Notes (Signed)
[place] EMERGENCY DEPARTMENT AT [facility] Provider Note   CSN: [id number] Arrival date & time: 04/14/23  1603     History  Chief Complaint  Patient presents with   Hernia    Gary Harrington is a 62 y.o. male.  HPI      Gary Harrington is a 62 y.o. male with past medical history of COPD, anxiety disorder, hypertension who presents to the Emergency Department complaining of persistent pain and swelling of his left inguinal area.  States he has a history of a hernia and is scheduled to see general surgery later this week.  He was seen here on 04/12/2023 for his hernia.  He returns today due to continued pain.  He is requesting pain medication.  He denies fever, chills, abdominal pain, vomiting or dysuria    Home Medications Prior to Admission medications   Medication Sig Start Date End Date Taking? Authorizing Provider  Aspirin-Acetaminophen (GOODYS BODY PAIN PO) Take 1 Package by mouth every 4 (four) hours as needed (pain).    [provider]  atorvastatin (LIPITOR) 40 MG tablet Take 1 tablet (40 mg total) by mouth daily. 01/20/22 04/16/23  Carroll Sage, PA-C  hydrOXYzine (ATARAX/VISTARIL) 25 MG tablet Take 1 tablet (25 mg total) by mouth every 6 (six) hours as needed for itching. 05/19/20   Rancour, Jeannett Senior, MD  naproxen (NAPROSYN) 375 MG tablet Take 1 tablet (375 mg total) by mouth 2 (two) times daily. 02/15/23   Kommor, Madison, MD  naproxen (NAPROSYN) 375 MG tablet Take 1 tablet (375 mg total) by mouth 2 (two) times daily. 03/27/23   Kommor, Madison, MD  sulfamethoxazole-trimethoprim (BACTRIM DS) 800-160 MG tablet Take 1 tablet by mouth 2 (two) times daily for 7 days. 04/12/23 04/19/23  Kommor, Madison, MD  Vitamin D, Ergocalciferol, (DRISDOL) 1.25 MG (50000 UNIT) CAPS capsule Take 50,000 Units by mouth once a week. 10/31/21   [provider]      Allergies    Codeine, Other, Penicillins, and Prednisone    Review of Systems   Review of  Systems  Constitutional:  Negative for appetite change, chills and fever.  Respiratory:  Negative for shortness of breath.   Cardiovascular:  Negative for chest pain.  Gastrointestinal:  Negative for abdominal pain, nausea and vomiting.  Genitourinary:  Positive for scrotal swelling. Negative for dysuria, flank pain, frequency and penile pain.  Musculoskeletal:  Negative for back pain.  Skin:  Negative for color change and wound.  Neurological:  Negative for weakness and numbness.    Physical Exam Updated Vital Signs BP 110/77 (BP Location: Right Arm)   Pulse 73   Temp 98.1 F (36.7 C) (Oral)   Resp 20   Ht  (1.753 m)   Wt 97.5 kg   SpO2 97%   BMI 31.75 kg/m  Physical Exam Vitals and nursing note reviewed.  Constitutional:      General: He is not in acute distress.    Appearance: Normal appearance. He is not toxic-appearing.  Cardiovascular:     Rate and Rhythm: Normal rate and regular rhythm.     Pulses: Normal pulses.  Pulmonary:     Effort: Pulmonary effort is normal.  Abdominal:     Palpations: Abdomen is soft.     Tenderness: There is no abdominal tenderness.  Genitourinary:    Comments: GU exam deferred as patient is requesting examination by male provider Musculoskeletal:        General: Normal range of motion.  Skin:    General: Skin is warm.     Capillary Refill: Capillary refill takes less than 2 seconds.  Neurological:     General: No focal deficit present.     Mental Status: He is alert.     Sensory: No sensory deficit.     Motor: No weakness.     ED Results / Procedures / Treatments   Labs (all labs ordered are listed, but only abnormal results are displayed) Labs Reviewed  CBC WITH DIFFERENTIAL/PLATELET - Abnormal; Notable for the following components:      Result Value   Hemoglobin 12.2 (*)    MCV 68.2 (*)    MCH 21.2 (*)    All other components within normal limits  COMPREHENSIVE METABOLIC PANEL - Abnormal; Notable for the following  components:   Calcium 8.8 (*)    All other components within normal limits    EKG None  Radiology No results found.  Procedures Procedures    Medications Ordered in ED Medications - No data to display  ED Course/ Medical Decision Making/ A&P                             Medical Decision Making Patient here with left groin pain and history of inguinal hernia seen here recently for same returns on this visit requesting pain control.  Denies any new or worsening symptoms.  Review of previous medical record, CT abdomen pelvis 4 04/11/2022 shows large left inguinal hernia with a 4.5 cm wide mouth, containing a long segment of the sigmoid colon.   Amount and/or Complexity of Data Reviewed Labs: ordered.    Details: Labs interpreted by me, no evidence of leukocytosis, chemistries without derangement Discussion of management or test interpretation with external provider(s): GU exam was performed by Dr. Estell Harpin at patient's request for male provider.  See his note for additional exam findings and consultation  When I entered the exam room to discuss lab findings, patient had eloped the department.  Neither I nor nursing staff was informed of his departure           Final Clinical Impression(s) / ED Diagnoses Final diagnoses:  Unilateral inguinal hernia without obstruction or gangrene, recurrence not specified    Rx / DC Orders ED Discharge Orders     None         Pauline Aus, PA-C 04/16/23 1513    Bethann Berkshire, MD 04/17/23 1600    Bethann Berkshire, MD 04/17/23 [phone]

## 2023-04-17 ENCOUNTER — Other Ambulatory Visit: Payer: Self-pay

## 2023-04-17 ENCOUNTER — Emergency Department (HOSPITAL_COMMUNITY)
Admission: EM | Admit: 2023-04-17 | Discharge: 2023-04-17 | Disposition: A | Discharge status: Home / Self Care | Payer: Medicaid Other | Attending: Emergency Medicine | Admitting: Emergency Medicine

## 2023-04-17 ENCOUNTER — Encounter (HOSPITAL_COMMUNITY): Payer: Self-pay | Admitting: Emergency Medicine

## 2023-04-17 DIAGNOSIS — Z4801 Encounter for change or removal of surgical wound dressing: Secondary | ICD-10-CM | POA: Diagnosis not present

## 2023-04-17 DIAGNOSIS — Z5189 Encounter for other specified aftercare: Secondary | ICD-10-CM

## 2023-04-17 MED ORDER — SULFAMETHOXAZOLE-TRIMETHOPRIM 800-160 MG PO TABS: 1.0000 | ORAL_TABLET | Freq: Two times a day (BID) | ORAL | 0 refills | Status: AC

## 2023-04-17 MED ORDER — TRIAMCINOLONE ACETONIDE 0.1 % EX OINT
TOPICAL_OINTMENT | CUTANEOUS | Status: AC
  Administered 2023-04-17: 1 via TOPICAL
  Filled 2023-04-17: qty 15

## 2023-04-17 MED ORDER — TRIAMCINOLONE ACETONIDE 0.1 % EX CREA: 1.0000 | TOPICAL_CREAM | Freq: Two times a day (BID) | CUTANEOUS | 0 refills | Status: DC

## 2023-04-17 MED ORDER — SULFAMETHOXAZOLE-TRIMETHOPRIM 800-160 MG PO TABS: 1.0000 | ORAL_TABLET | Freq: Two times a day (BID) | ORAL | 0 refills | Status: DC

## 2023-04-17 NOTE — ED Notes (Signed)
Applied Kenalog ointment as prescribed by EDP and wrapped both legs with non stick bandages along with ace bandages. Pt tolerated well and very thankful

## 2023-04-17 NOTE — ED Triage Notes (Signed)
Pt arrives POV requesting for his bandages to be changed and to be given his abx. Pt states he was prescribed a abx last week but hasn't been able to get it filled.

## 2023-04-17 NOTE — ED Provider Notes (Signed)
[place] EMERGENCY DEPARTMENT AT [facility] Provider Note   CSN: [id number] Arrival date & time: 04/17/23  0544     History  Chief Complaint  Patient presents with   Wound Check    Gary Harrington is a 62 y.o. male.   Wound Check   This patient is a 62 year old male well-known to the emergency department, chronic pain, chronic hernia, was here several days ago because of multiple chigger bites to the legs and had his legs dressed with sterile dressings, comes back for a wound check because he has not been able to bend over secondary to his hernia to change these dressings.  No other complaints, he has not picked up his antibiotic.    Home Medications Prior to Admission medications   Medication Sig Start Date End Date Taking? Authorizing Provider  triamcinolone cream (KENALOG) 0.1 % Apply 1 Application topically 2 (two) times daily. 04/17/23  Yes Eber Hong, MD  atorvastatin (LIPITOR) 40 MG tablet Take 1 tablet (40 mg total) by mouth daily. 01/20/22 04/16/23  Carroll Sage, PA-C  hydrOXYzine (ATARAX/VISTARIL) 25 MG tablet Take 1 tablet (25 mg total) by mouth every 6 (six) hours as needed for itching. 05/19/20   Rancour, Jeannett Senior, MD  naproxen (NAPROSYN) 375 MG tablet Take 1 tablet (375 mg total) by mouth 2 (two) times daily. 02/15/23   Kommor, Madison, MD  sulfamethoxazole-trimethoprim (BACTRIM DS) 800-160 MG tablet Take 1 tablet by mouth 2 (two) times daily for 7 days. 04/17/23 04/24/23  Eber Hong, MD  Vitamin D, Ergocalciferol, (DRISDOL) 1.25 MG (50000 UNIT) CAPS capsule Take 50,000 Units by mouth once a week. 10/31/21   [provider]      Allergies    Codeine, Other, Penicillins, and Prednisone    Review of Systems   Review of Systems  All other systems reviewed and are negative.   Physical Exam Updated Vital Signs BP (!) 124/90 (BP Location: Right Arm)   Pulse 67   Temp 97.6 F (36.4 C)   Resp 18   Ht 1.753 m (5\' 9" )   Wt 95.7 kg    SpO2 100%   BMI 31.16 kg/m  Physical Exam Vitals and nursing note reviewed.  Constitutional:      General: He is not in acute distress.    Appearance: He is well-developed.  HENT:     Head: Normocephalic and atraumatic.     Mouth/Throat:     Pharynx: No oropharyngeal exudate.  Eyes:     General: No scleral icterus.       Right eye: No discharge.        Left eye: No discharge.     Conjunctiva/sclera: Conjunctivae normal.     Pupils: Pupils are equal, round, and reactive to light.  Neck:     Thyroid: No thyromegaly.     Vascular: No JVD.  Cardiovascular:     Rate and Rhythm: Normal rate and regular rhythm.     Heart sounds: Normal heart sounds. No murmur heard.    No friction rub. No gallop.  Pulmonary:     Effort: Pulmonary effort is normal. No respiratory distress.     Breath sounds: Normal breath sounds. No wheezing or rales.  Abdominal:     General: Bowel sounds are normal. There is no distension.     Palpations: Abdomen is soft. There is no mass.     Tenderness: There is no abdominal tenderness.  Genitourinary:    Comments: Hernia present, soft nontender abdomen  Musculoskeletal:        General: No tenderness. Normal range of motion.     Cervical back: Normal range of motion and neck supple.  Lymphadenopathy:     Cervical: No cervical adenopathy.  Skin:    General: Skin is warm and dry.     Findings: No erythema or rash.     Comments: Multiple shallow erythematous ulcerations on the bilateral lower extremities from the knees to the ankles consistent with bug bites, no surrounding induration warmth or foul-smelling drainage.  No petechiae or purpura, normal pulses at the feet  Neurological:     Mental Status: He is alert.     Coordination: Coordination normal.  Psychiatric:        Behavior: Behavior normal.     ED Results / Procedures / Treatments   Labs (all labs ordered are listed, but only abnormal results are displayed) Labs Reviewed - No data to  display  EKG None  Radiology No results found.  Procedures Procedures    Medications Ordered in ED Medications  triamcinolone ointment (KENALOG) 0.1 % (has no administration in time range)    ED Course/ Medical Decision Making/ A&P                             Medical Decision Making Risk Prescription drug management.   Well-appearing Dressing change, refill topical steroid for legs for itching as well as antibiotic, patient agreeable to follow-up, vital signs unremarkable and reassuring, no signs of sepsis        Final Clinical Impression(s) / ED Diagnoses Final diagnoses:  Visit for wound check    Rx / DC Orders ED Discharge Orders          Ordered    sulfamethoxazole-trimethoprim (BACTRIM DS) 800-160 MG tablet  2 times daily        04/17/23 0756    triamcinolone cream (KENALOG) 0.1 %  2 times daily        04/17/23 0757              Eber Hong, MD 04/17/23 [phone]

## 2023-04-17 NOTE — Discharge Instructions (Signed)
Please continue to apply the topical steroid ointment to your legs, the antibiotic called Bactrim has been prescribed, take this twice a day to prevent infections in your legs and follow-up with your family doctor.  Please change the dressing every single day

## 2023-04-20 ENCOUNTER — Other Ambulatory Visit: Payer: Self-pay | Admitting: Family Medicine

## 2023-04-20 DIAGNOSIS — K4030 Unilateral inguinal hernia, with obstruction, without gangrene, not specified as recurrent: Secondary | ICD-10-CM

## 2023-04-24 ENCOUNTER — Telehealth (INDEPENDENT_AMBULATORY_CARE_PROVIDER_SITE_OTHER): Payer: Medicaid Other | Admitting: *Deleted

## 2023-04-24 DIAGNOSIS — K4030 Unilateral inguinal hernia, with obstruction, without gangrene, not specified as recurrent: Secondary | ICD-10-CM

## 2023-04-24 MED ORDER — OXYCODONE HCL 5 MG PO TABS: 5.0000 mg | ORAL_TABLET | ORAL | 0 refills | Status: DC | PRN

## 2023-04-24 NOTE — Telephone Encounter (Signed)
Received call from patient [phone]~ telephone.   Patient reports that he currently has chest congestion and productive cough with clear mucus. States that he is taking Bactrim DS for chigger infection to BLE. States that he is also taking Robitussin for cough. Reports that he will not be able to have surgery until cough and chest congestion has cleared. Advised to continue to monitor mucus production. If high fever, green mucus production, chest pain, or SOB noted, return to ER for evaluation.   States that cough has caused increased pain to inguinal hernia. States that he is attempting to brace hernia with towel or pillow while coughing, but sometimes is unable to before cough begins. Requested refill on pain medication, but also inquired if he could be prescribed stronger medication.   Last prescription: 04/16/2023  HYDROcodone-acetaminophen (NORCO) 5-325 MG tablet #15 tabs  Pharmacy: [facility], [address]

## 2023-04-28 ENCOUNTER — Telehealth (INDEPENDENT_AMBULATORY_CARE_PROVIDER_SITE_OTHER): Payer: Medicaid Other | Admitting: General Surgery

## 2023-04-28 ENCOUNTER — Telehealth: Payer: Self-pay | Admitting: *Deleted

## 2023-04-28 DIAGNOSIS — K4030 Unilateral inguinal hernia, with obstruction, without gangrene, not specified as recurrent: Secondary | ICD-10-CM

## 2023-04-28 MED ORDER — OXYCODONE HCL 5 MG PO TABS: 5.0000 mg | ORAL_TABLET | ORAL | 0 refills | Status: DC | PRN

## 2023-04-28 NOTE — Telephone Encounter (Signed)
Received new orders from Dr. Henreitta Leber to schedule patient for surgery.   Procedure:XI ROBOTIC ASSISTED INGUINAL HERNIA REPAIR WITH MESH, LEFT- Outpatient in Bed CPT: 56213  Dx: K40.30  Possible Dates: 05/05/2023, 05/13/2023  Attempted to call patient but noted that phone number is not in service. Writer went to patient address to discuss possible surgery dates. Patient prefers to defer to 05/13/2023 due to "head cold". Patient son did report new phone number as [phone].  Of note, patient son reports that patient has deep cough that worsens at night. States that patient has known COPD and is smoker. Patient states that PCP is located in [place], and he is not scheduled to follow up with PCP for some time.   Writer did not assess patient at the time of visit and did not listen to lungs. Writer did observe that patient was up walking outside home while smoking. Noted nonproductive cough that resembled chronic cough from smoking. No SOB, gasping breaths, or exacerbation Sx noted.   Discussed with Dr. Henreitta Leber who advised if cough continues or worsens to follow up with PCP or UC for possible COPD exacerbation. Call placed to patient son and son was made aware. Agreeable to plan.   Also reports that patient requires refill on Oxycodone 5mg . Last prescription given on 04/24/2023, # 20 tabs. Requesting refill to be sent to [facility] in [place] on [address]. Dr. Henreitta Leber made aware and refill sent to pharmacy.

## 2023-04-28 NOTE — Telephone Encounter (Signed)
[facility]  Refill sent in. We have him on the schedule now. He needs to try to use tylenol and ibuprofen in addition to the roxicodone.   Go to ED with severe or worsening pain or issues with nausea/vomiting /severe constipation.    Algis Greenhouse, MD [facility] [address] [place], [place] [address] [phone] (office)

## 2023-04-29 ENCOUNTER — Emergency Department (HOSPITAL_COMMUNITY)
Admission: EM | Admit: 2023-04-29 | Discharge: 2023-04-29 | Disposition: A | Discharge status: Home / Self Care | Payer: Medicaid Other | Attending: Emergency Medicine | Admitting: Emergency Medicine

## 2023-04-29 ENCOUNTER — Emergency Department (HOSPITAL_COMMUNITY): Payer: Medicaid Other

## 2023-04-29 ENCOUNTER — Encounter (HOSPITAL_COMMUNITY): Payer: Self-pay | Admitting: *Deleted

## 2023-04-29 ENCOUNTER — Other Ambulatory Visit: Payer: Self-pay

## 2023-04-29 DIAGNOSIS — J449 Chronic obstructive pulmonary disease, unspecified: Secondary | ICD-10-CM | POA: Diagnosis not present

## 2023-04-29 DIAGNOSIS — F17200 Nicotine dependence, unspecified, uncomplicated: Secondary | ICD-10-CM | POA: Insufficient documentation

## 2023-04-29 DIAGNOSIS — R059 Cough, unspecified: Secondary | ICD-10-CM | POA: Diagnosis present

## 2023-04-29 DIAGNOSIS — J189 Pneumonia, unspecified organism: Secondary | ICD-10-CM | POA: Insufficient documentation

## 2023-04-29 MED ORDER — DOXYCYCLINE HYCLATE 100 MG PO CAPS: 100.0000 mg | ORAL_CAPSULE | Freq: Two times a day (BID) | ORAL | 0 refills | Status: AC

## 2023-04-29 NOTE — ED Provider Notes (Signed)
[place] EMERGENCY DEPARTMENT AT [facility] Provider Note   CSN: [id number] Arrival date & time: 04/29/23  4098     History  Chief Complaint  Patient presents with   Cough    Gary Harrington is a 62 y.o. male.  HPI 62 year old male presents with cough.  He had a cough for about 2 weeks.  He endorses occasional clear sputum.  He denies shortness of breath, fevers, vomiting.  He is due to have hernia surgery in 2 weeks and recently called his surgeon where he was told to get evaluated in the ER.  Patient does continue to smoke and has a history of COPD.  He states he is uses albuterol inhaler about 3 times over the last couple weeks, has not felt much of a need for it.  Home Medications Prior to Admission medications   Medication Sig Start Date End Date Taking? Authorizing Provider  doxycycline (VIBRAMYCIN) 100 MG capsule Take 1 capsule (100 mg total) by mouth 2 (two) times daily for 5 days. 04/29/23 05/04/23 Yes Pricilla Loveless, MD  atorvastatin (LIPITOR) 40 MG tablet Take 1 tablet (40 mg total) by mouth daily. 01/20/22 04/16/23  Carroll Sage, PA-C  hydrOXYzine (ATARAX/VISTARIL) 25 MG tablet Take 1 tablet (25 mg total) by mouth every 6 (six) hours as needed for itching. 05/19/20   Rancour, Jeannett Senior, MD  naproxen (NAPROSYN) 375 MG tablet Take 1 tablet (375 mg total) by mouth 2 (two) times daily. 02/15/23   Kommor, Madison, MD  oxyCODONE (ROXICODONE) 5 MG immediate release tablet Take 1 tablet (5 mg total) by mouth every 4 (four) hours as needed for severe pain or breakthrough pain. 04/28/23   Lucretia Roers, MD  triamcinolone cream (KENALOG) 0.1 % Apply 1 Application topically 2 (two) times daily. 04/17/23   Eber Hong, MD  Vitamin D, Ergocalciferol, (DRISDOL) 1.25 MG (50000 UNIT) CAPS capsule Take 50,000 Units by mouth once a week. 10/31/21   [provider]      Allergies    Codeine, Other, Penicillins, and Prednisone    Review of Systems   Review of  Systems  Constitutional:  Negative for fever.  Respiratory:  Positive for cough. Negative for shortness of breath and wheezing.   Gastrointestinal:  Negative for vomiting.    Physical Exam Updated Vital Signs BP 98/75 (BP Location: Right Arm)   Pulse 81   Temp 97.8 F (36.6 C) (Oral)   Resp 20   Ht 5\' 9"  (1.753 m)   Wt 95.7 kg   SpO2 100%   BMI 31.16 kg/m  Physical Exam Vitals and nursing note reviewed.  Constitutional:      General: He is not in acute distress.    Appearance: He is well-developed. He is not ill-appearing or diaphoretic.  HENT:     Head: Normocephalic and atraumatic.  Cardiovascular:     Rate and Rhythm: Normal rate and regular rhythm.     Heart sounds: Normal heart sounds.  Pulmonary:     Effort: Pulmonary effort is normal. No tachypnea, accessory muscle usage or respiratory distress.     Comments: Mild crackles on expiration in LLL Abdominal:     General: There is no distension.  Skin:    General: Skin is warm and dry.  Neurological:     Mental Status: He is alert.     ED Results / Procedures / Treatments   Labs (all labs ordered are listed, but only abnormal results are displayed) Labs Reviewed - No  data to display  EKG None  Radiology DG Chest 2 View  Result Date: 04/29/2023 CLINICAL DATA:  Cough EXAM: CHEST - 2 VIEW COMPARISON:  CXR 12/07/22 FINDINGS: No pleural effusion. No pneumothorax. No focal airspace opacity. There are prominent bilateral interstitial opacities, which can Harrington seen in the setting of bronchitis. Normal cardiac and mediastinal contours. Chronic left lateral 4-6 rib fractures. Vertebral body heights are maintained. IMPRESSION: Prominent bilateral interstitial opacities could Harrington seen in the setting of bronchitis. Electronically Signed   By: Lorenza Cambridge M.D.   On: 04/29/2023 08:05    Procedures Procedures    Medications Ordered in ED Medications - No data to display  ED Course/ Medical Decision Making/ A&P                              Medical Decision Making Amount and/or Complexity of Data Reviewed Radiology: ordered and independent interpretation performed.    Details: No lobar pneumonia  Risk Prescription drug management.   Patient presents with cough for 2 weeks.  No significant wheezing though he is a chronic smoker and has a previous history of COPD.  Chest x-ray without obvious lobar pneumonia.  However with some mild crackles on exam and his history, will give coverage for atypical pneumonia.  Otherwise vitals are unremarkable.  He has a low but normal blood pressure.  Will recommend follow-up with PCP and give return precautions.        Final Clinical Impression(s) / ED Diagnoses Final diagnoses:  Atypical pneumonia    Rx / DC Orders ED Discharge Orders          Ordered    doxycycline (VIBRAMYCIN) 100 MG capsule  2 times daily        04/29/23 1610              Pricilla Loveless, MD 04/29/23 [phone]

## 2023-04-29 NOTE — ED Triage Notes (Signed)
Pt states he is here for his cough that he has had x 2 weeks.  Pt states "I caught this cough 2 weeks ago here in the waiting room. Dr. Henreitta Leber told me to come in and get antibiotics cause I'm having emergency surgery on the 22nd"   Pt states "it hurts my guts when I cough"

## 2023-04-29 NOTE — Discharge Instructions (Signed)
We are covering you for possible pneumonia with antibiotics.  Please complete the full course unless you develop significant side effects or adverse reactions.  Otherwise follow-up with your primary care physician.  If you develop fever, coughing up blood, trouble breathing, chest pain, or any other new/concerning symptoms then return to the ER or call 911.

## 2023-04-30 ENCOUNTER — Telehealth: Payer: Self-pay | Admitting: *Deleted

## 2023-04-30 NOTE — Telephone Encounter (Signed)
If he is hurting that severely; he may need to go to the hospital. If he has any signs of obstruction or fevers he should go to the hospital. I will refill his medication again tomorrow. But I advise if his pain is that bad that he may need to be seen in the ED.

## 2023-04-30 NOTE — Telephone Encounter (Signed)
Call placed to patient and patient made aware.   Patient states again that he thinks cough is contributing to abdominal pain. Again advised that patient should be evaluated by PCP, UC, or ER if cough is that pronounced. Patient states that if he is not improving any by 05/10, he will go to [facility] for evaluation .

## 2023-04-30 NOTE — Telephone Encounter (Signed)
Received call from patient son, Molly Maduro [phone] telephone.   Molly Maduro reports that patient continues to voice complaints of severe pain. States that patient is taking Oxycodone 5mg  QID with little to no relief. Molly Maduro reports that patient is unable to rest and again asked for stronger medication for patient.   Advised that pain will most likely not be adequately managed until hernia is repaired. Robert requested any other recommendations from provider as current regimen is not effective. Also reports that since patient is taking Oxycodone 4x daily, he will be out of medication on Saturday.   Molly Maduro also reports that phone call was placed to PCP in [place] and appointment was scheduled for 2 months out. PCP recommended that patient F/U with surgeon for pain management at this time.   Please advise.

## 2023-05-01 ENCOUNTER — Other Ambulatory Visit (INDEPENDENT_AMBULATORY_CARE_PROVIDER_SITE_OTHER): Payer: Medicaid Other | Admitting: General Surgery

## 2023-05-01 ENCOUNTER — Encounter (HOSPITAL_COMMUNITY): Payer: Self-pay

## 2023-05-01 ENCOUNTER — Emergency Department (HOSPITAL_COMMUNITY): Payer: Medicaid Other

## 2023-05-01 ENCOUNTER — Other Ambulatory Visit: Payer: Self-pay

## 2023-05-01 ENCOUNTER — Emergency Department (HOSPITAL_COMMUNITY)
Admission: EM | Admit: 2023-05-01 | Discharge: 2023-05-01 | Disposition: A | Discharge status: Home / Self Care | Payer: Medicaid Other | Attending: Emergency Medicine | Admitting: Emergency Medicine

## 2023-05-01 DIAGNOSIS — R059 Cough, unspecified: Secondary | ICD-10-CM | POA: Diagnosis present

## 2023-05-01 DIAGNOSIS — F17210 Nicotine dependence, cigarettes, uncomplicated: Secondary | ICD-10-CM | POA: Insufficient documentation

## 2023-05-01 DIAGNOSIS — Z8673 Personal history of transient ischemic attack (TIA), and cerebral infarction without residual deficits: Secondary | ICD-10-CM | POA: Insufficient documentation

## 2023-05-01 DIAGNOSIS — J449 Chronic obstructive pulmonary disease, unspecified: Secondary | ICD-10-CM | POA: Diagnosis not present

## 2023-05-01 DIAGNOSIS — I10 Essential (primary) hypertension: Secondary | ICD-10-CM | POA: Diagnosis not present

## 2023-05-01 DIAGNOSIS — Z79899 Other long term (current) drug therapy: Secondary | ICD-10-CM | POA: Diagnosis not present

## 2023-05-01 DIAGNOSIS — R052 Subacute cough: Secondary | ICD-10-CM | POA: Diagnosis not present

## 2023-05-01 DIAGNOSIS — K4030 Unilateral inguinal hernia, with obstruction, without gangrene, not specified as recurrent: Secondary | ICD-10-CM

## 2023-05-01 LAB — BASIC METABOLIC PANEL
Anion gap: 8 (ref 5–15)
BUN: 11 mg/dL (ref 8–23)
CO2: 26 mmol/L (ref 22–32)
Calcium: 9.3 mg/dL (ref 8.9–10.3)
Chloride: 102 mmol/L (ref 98–111)
Creatinine, Ser: 1.09 mg/dL (ref 0.61–1.24)
GFR, Estimated: >60 mL/min (ref >60)
Glucose, Bld: 96 mg/dL (ref 70–99)
Potassium: 4.9 mmol/L (ref 3.5–5.1)
Sodium: 136 mmol/L (ref 135–145)

## 2023-05-01 LAB — CBC WITH DIFFERENTIAL/PLATELET
Abs Immature Granulocytes: 0.04 10*3/uL (ref 0.00–0.07)
Basophils Absolute: 0.1 10*3/uL (ref 0.0–0.1)
Basophils Relative: 1 %
Eosinophils Absolute: 0.4 10*3/uL (ref 0.0–0.5)
Eosinophils Relative: 5 %
HCT: 39.9 % (ref 39.0–52.0)
Hemoglobin: 12.6 g/dL — ABNORMAL LOW (ref 13.0–17.0)
Immature Granulocytes: 0 %
Lymphocytes Relative: 21 %
Lymphs Abs: 2 10*3/uL (ref 0.7–4.0)
MCH: 21.5 pg — ABNORMAL LOW (ref 26.0–34.0)
MCHC: 31.6 g/dL (ref 30.0–36.0)
MCV: 68.1 fL — ABNORMAL LOW (ref 80.0–100.0)
Monocytes Absolute: 0.7 10*3/uL (ref 0.1–1.0)
Monocytes Relative: 7 %
Neutro Abs: 6.2 10*3/uL (ref 1.7–7.7)
Neutrophils Relative %: 66 %
Platelets: 347 10*3/uL (ref 150–400)
RBC: 5.86 MIL/uL — ABNORMAL HIGH (ref 4.22–5.81)
RDW: 16.6 % — ABNORMAL HIGH (ref 11.5–15.5)
WBC: 9.4 10*3/uL (ref 4.0–10.5)
nRBC: 0 % (ref 0.0–0.2)

## 2023-05-01 MED ORDER — BENZONATATE 100 MG PO CAPS: 100.0000 mg | ORAL_CAPSULE | Freq: Three times a day (TID) | ORAL | 0 refills | Status: DC | PRN

## 2023-05-01 MED ORDER — OXYCODONE HCL 5 MG PO TABS: 5.0000 mg | ORAL_TABLET | ORAL | 0 refills | Status: DC | PRN

## 2023-05-01 NOTE — Discharge Instructions (Addendum)
We evaluated you for your cough.  Your chest x-ray did not show any pneumonia.  We have prescribed you cough medicine to help with your symptoms.  Please follow-up with your primary doctor.

## 2023-05-01 NOTE — ED Notes (Signed)
Patient took paperwork and walked out, no last set of vitals obtained. Patient was upset with his prescribed medication and didn't want to go over paperwork

## 2023-05-01 NOTE — ED Notes (Signed)
Patient given a coffee to drink

## 2023-05-01 NOTE — Progress Notes (Signed)
[facility]  Patient seen in ED and CXR clear and is on doxycyline RN reports. Requesting another refill of roxicodone due to pain. This has been refilled. Plan for OR 5/22.   Algis Greenhouse, MD [facility] [address] [place], [place] [address] [phone] (office)

## 2023-05-01 NOTE — ED Provider Notes (Signed)
[place] EMERGENCY DEPARTMENT AT [facility] Provider Note  CSN: [id number] Arrival date & time: 05/01/23 4098  Chief Complaint(s) No chief complaint on file.  HPI Gary Harrington is a 62 y.o. male with history of COPD, hypertension, presenting to the emergency department with cough.  He reports cough for 2 weeks.  No chest pain, shortness of breath.  No leg swelling.  No recent travel or surgeries.  No fevers or chills.  He was seen in the emergency department 2 days ago for the same symptoms and was prescribed doxycycline, reports he is taking this.  Came back to the emergency department today because he has had persistent cough.    Past Medical History Past Medical History:  Diagnosis Date   Chronic pain    COPD (chronic obstructive pulmonary disease) (HCC)    Dizziness    Headache    Hypertension    Stroke [facility]) 2000   Patient Active Problem List   Diagnosis Date Noted   Incarcerated left inguinal hernia 04/16/2023   Anxiety disorder 06/17/2020   Chest pain 06/17/2020   Essential hypertension 06/17/2020   Hyperlipidemia 06/17/2020   Syncope 06/17/2020   Tobacco abuse 06/17/2020   Home Medication(s) Prior to Admission medications   Medication Sig Start Date End Date Taking? Authorizing Provider  benzonatate (TESSALON) 100 MG capsule Take 1 capsule (100 mg total) by mouth 3 (three) times daily as needed for cough. 05/01/23  Yes Lonell Grandchild, MD  atorvastatin (LIPITOR) 40 MG tablet Take 1 tablet (40 mg total) by mouth daily. 01/20/22 04/16/23  Carroll Sage, PA-C  doxycycline (VIBRAMYCIN) 100 MG capsule Take 1 capsule (100 mg total) by mouth 2 (two) times daily for 5 days. 04/29/23 05/04/23  Pricilla Loveless, MD  hydrOXYzine (ATARAX/VISTARIL) 25 MG tablet Take 1 tablet (25 mg total) by mouth every 6 (six) hours as needed for itching. 05/19/20   Rancour, Jeannett Senior, MD  naproxen (NAPROSYN) 375 MG tablet Take 1 tablet (375 mg total) by mouth 2 (two) times daily.  02/15/23   Kommor, Madison, MD  oxyCODONE (ROXICODONE) 5 MG immediate release tablet Take 1 tablet (5 mg total) by mouth every 4 (four) hours as needed for severe pain or breakthrough pain. 04/28/23   Lucretia Roers, MD  triamcinolone cream (KENALOG) 0.1 % Apply 1 Application topically 2 (two) times daily. 04/17/23   Eber Hong, MD  Vitamin D, Ergocalciferol, (DRISDOL) 1.25 MG (50000 UNIT) CAPS capsule Take 50,000 Units by mouth once a week. 10/31/21   [provider]                                                                                                                                    Past Surgical History Past Surgical History:  Procedure Laterality Date   TONSILLECTOMY  age 35   Family History Family History  Problem Relation Age of Onset   Stroke Father  Heart failure Father     Social History Social History   Tobacco Use   Smoking status: Every Day    Packs/day: 3.00    Years: 27.00    Additional pack years: 0.00    Total pack years: 81.00    Types: Cigarettes   Smokeless tobacco: Never  Vaping Use   Vaping Use: Never used  Substance Use Topics   Alcohol use: No   Drug use: No   Allergies Codeine, Other, Penicillins, and Prednisone  Review of Systems Review of Systems  All other systems reviewed and are negative.   Physical Exam Vital Signs  I have reviewed the triage vital signs BP 101/87 (BP Location: Right Arm)   Pulse 68   Temp 98 F (36.7 C) (Oral)   Resp 18   SpO2 98%  Physical Exam Vitals and nursing note reviewed.  Constitutional:      General: He is not in acute distress.    Appearance: Normal appearance.  HENT:     Mouth/Throat:     Mouth: Mucous membranes are moist.  Eyes:     Conjunctiva/sclera: Conjunctivae normal.  Cardiovascular:     Rate and Rhythm: Normal rate and regular rhythm.  Pulmonary:     Effort: Pulmonary effort is normal. No respiratory distress.     Breath sounds: Normal breath sounds.   Abdominal:     General: Abdomen is flat.     Palpations: Abdomen is soft.     Tenderness: There is no abdominal tenderness.  Musculoskeletal:     Right lower leg: No edema.     Left lower leg: No edema.  Skin:    General: Skin is warm and dry.     Capillary Refill: Capillary refill takes less than 2 seconds.  Neurological:     Mental Status: He is alert and oriented to person, place, and time. Mental status is at baseline.  Psychiatric:        Mood and Affect: Mood normal.        Behavior: Behavior normal.     ED Results and Treatments Labs (all labs ordered are listed, but only abnormal results are displayed) Labs Reviewed  CBC WITH DIFFERENTIAL/PLATELET - Abnormal; Notable for the following components:      Result Value   RBC 5.86 (*)    Hemoglobin 12.6 (*)    MCV 68.1 (*)    MCH 21.5 (*)    RDW 16.6 (*)    All other components within normal limits  BASIC METABOLIC PANEL                                                                                                                          Radiology DG Chest 2 View  Result Date: 05/01/2023 CLINICAL DATA:  Cough. EXAM: CHEST - 2 VIEW COMPARISON:  04/29/2023 FINDINGS: Coarse lung markings are unchanged. No focal airspace disease or pulmonary edema. Heart and mediastinum are within normal limits. Trachea is midline. Left rib  deformities compatible with old rib fractures. Negative for a pneumothorax. No pleural effusions. Mild degenerative endplate changes in thoracic spine. IMPRESSION: 1. No acute cardiopulmonary disease. 2. Old left rib fractures. Electronically Signed   By: Richarda Overlie M.D.   On: 05/01/2023 07:54    Pertinent labs & imaging results that were available during my care of the patient were reviewed by me and considered in my medical decision making (see MDM for details).  Medications Ordered in ED Medications - No data to display                                                                                                                                    Procedures Procedures  (including critical care time)  Medical Decision Making / ED Course   MDM:  62 year old male presenting to the emergency department with cough.  Patient well-appearing, physical exam reassuring with clear lungs.  Unclear cause of cough, chest x-ray previously was without clear pneumonia but was started on antibiotics given history of COPD.  He reports his symptoms are essentially unchanged.  Will repeat x-ray to see for interval changes.  Does not appear volume overloaded.  No wheezing to suggest COPD exacerbation and patient not hypoxic.  He continues to smoke which is a likely contributor.  He is not on an ACE inhibitor.  He denies GERD symptoms.  Will reassess.  If chest x-ray unremarkable likely discharge with cough suppressant such as Tessalon Perles.   Clinical Course as of 05/01/23 0851  Fri May 01, 2023  0848 CXR without any changes. Will prescribe tessalon Perles. Will discharge patient to home. All questions answered. Patient comfortable with plan of discharge. Return precautions discussed with patient and specified on the after visit summary.  [WS]    Clinical Course User Index [WS] Lonell Grandchild, MD     Additional history obtained: -External records from outside source obtained and reviewed including: Chart review including previous notes, labs, imaging, consultation notes including ER visit 2 days ago   Lab Tests: -I ordered, reviewed, and interpreted labs.   The pertinent results include:   Labs Reviewed  CBC WITH DIFFERENTIAL/PLATELET - Abnormal; Notable for the following components:      Result Value   RBC 5.86 (*)    Hemoglobin 12.6 (*)    MCV 68.1 (*)    MCH 21.5 (*)    RDW 16.6 (*)    All other components within normal limits  BASIC METABOLIC PANEL    Notable for mild anemia  Imaging Studies ordered: I ordered imaging studies including CXR On my interpretation imaging  demonstrates no acute process I independently visualized and interpreted imaging. I agree with the radiologist interpretation   Medicines ordered and prescription drug management: Meds ordered this encounter  Medications   benzonatate (TESSALON) 100 MG capsule    Sig: Take 1 capsule (100 mg total) by mouth 3 (three) times daily as  needed for cough.    Dispense:  21 capsule    Refill:  0    -I have reviewed the patients home medicines and have made adjustments as needed   Social Determinants of Health:  Diagnosis or treatment significantly limited by social determinants of health: current smoker    Co morbidities that complicate the patient evaluation  Past Medical History:  Diagnosis Date   Chronic pain    COPD (chronic obstructive pulmonary disease) (HCC)    Dizziness    Headache    Hypertension    Stroke (HCC) 2000      Dispostion: Disposition decision including need for hospitalization was considered, and patient discharged from emergency department.    Final Clinical Impression(s) / ED Diagnoses Final diagnoses:  Subacute cough     This chart was dictated using voice recognition software.  Despite best efforts to proofread,  errors can occur which can change the documentation meaning.    Lonell Grandchild, MD 05/01/23 [phone]

## 2023-05-01 NOTE — ED Triage Notes (Signed)
Pt here with cough x2 weeks. Seen here other day for same and given antibiotics. Pt state cough isn't any better and has been taking cough medicine.

## 2023-05-04 ENCOUNTER — Telehealth (INDEPENDENT_AMBULATORY_CARE_PROVIDER_SITE_OTHER): Payer: Medicaid Other | Admitting: *Deleted

## 2023-05-04 DIAGNOSIS — K4030 Unilateral inguinal hernia, with obstruction, without gangrene, not specified as recurrent: Secondary | ICD-10-CM

## 2023-05-04 MED ORDER — OXYCODONE HCL 5 MG PO TABS: 5.0000 mg | ORAL_TABLET | ORAL | 0 refills | Status: DC | PRN
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
## 2023-05-04 NOTE — Telephone Encounter (Signed)
[facility]  Refilled and gave him 40 tablets. No more refills until after his surgery.   Algis Greenhouse, MD [facility] [address] [place], [place] [address] [phone] (office)

## 2023-05-04 NOTE — Telephone Encounter (Signed)
Discussed with provider and new prescription will be sent to the pharmacy.   Dr. Henreitta Leber advised that no further refills will be given until operation has been completed.   Call placed to patient and patient son Gary Harrington made aware.

## 2023-05-04 NOTE — Telephone Encounter (Signed)
Received VM from patient x2 [phone]~ telephone.   Patient requested refill on Oxycodone 5mg . Last refill 05/01/2023, #20 tablets.   Call placed to [facility] pharmacy. Was advised that patient did pick up prescription on 05/02/2023.

## 2023-05-08 ENCOUNTER — Encounter (HOSPITAL_COMMUNITY): Payer: Self-pay

## 2023-05-08 ENCOUNTER — Other Ambulatory Visit: Payer: Self-pay

## 2023-05-08 ENCOUNTER — Emergency Department (HOSPITAL_COMMUNITY)
Admission: EM | Admit: 2023-05-08 | Discharge: 2023-05-08 | Disposition: A | Discharge status: Home / Self Care | Payer: Medicaid Other | Source: Home / Self Care | Attending: Emergency Medicine | Admitting: Emergency Medicine

## 2023-05-08 ENCOUNTER — Emergency Department (HOSPITAL_COMMUNITY)
Admission: EM | Admit: 2023-05-08 | Discharge: 2023-05-08 | Disposition: A | Discharge status: Home / Self Care | Payer: Medicaid Other | Attending: Student | Admitting: Student

## 2023-05-08 ENCOUNTER — Telehealth: Payer: Self-pay | Admitting: *Deleted

## 2023-05-08 DIAGNOSIS — F17210 Nicotine dependence, cigarettes, uncomplicated: Secondary | ICD-10-CM | POA: Diagnosis not present

## 2023-05-08 DIAGNOSIS — J449 Chronic obstructive pulmonary disease, unspecified: Secondary | ICD-10-CM | POA: Diagnosis not present

## 2023-05-08 DIAGNOSIS — K029 Dental caries, unspecified: Secondary | ICD-10-CM

## 2023-05-08 DIAGNOSIS — I10 Essential (primary) hypertension: Secondary | ICD-10-CM | POA: Insufficient documentation

## 2023-05-08 DIAGNOSIS — Z87891 Personal history of nicotine dependence: Secondary | ICD-10-CM | POA: Insufficient documentation

## 2023-05-08 DIAGNOSIS — Z4800 Encounter for change or removal of nonsurgical wound dressing: Secondary | ICD-10-CM | POA: Diagnosis not present

## 2023-05-08 DIAGNOSIS — Z79899 Other long term (current) drug therapy: Secondary | ICD-10-CM | POA: Insufficient documentation

## 2023-05-08 DIAGNOSIS — Z8673 Personal history of transient ischemic attack (TIA), and cerebral infarction without residual deficits: Secondary | ICD-10-CM | POA: Insufficient documentation

## 2023-05-08 DIAGNOSIS — R21 Rash and other nonspecific skin eruption: Secondary | ICD-10-CM | POA: Diagnosis present

## 2023-05-08 DIAGNOSIS — Z5189 Encounter for other specified aftercare: Secondary | ICD-10-CM

## 2023-05-08 MED ORDER — CLINDAMYCIN HCL 150 MG PO CAPS: 150.0000 mg | ORAL_CAPSULE | Freq: Three times a day (TID) | ORAL | 0 refills | Status: DC

## 2023-05-08 MED ORDER — CLINDAMYCIN HCL 150 MG PO CAPS
150.0000 mg | ORAL_CAPSULE | Freq: Once | ORAL | Status: AC
  Administered 2023-05-08: 150 mg via ORAL
  Filled 2023-05-08: qty 1

## 2023-05-08 NOTE — Telephone Encounter (Signed)
Received call from patient son, Molly Maduro [phone] telephone.   Patient son reports that patient requires hospital bed for assistance with positioning, elevating head of bed, and assistance to get out of bed.  Advised to contact PCP for further recommendations. Advised that OV will most likely be required to order DME.

## 2023-05-08 NOTE — ED Provider Notes (Signed)
[place] EMERGENCY DEPARTMENT AT [facility] Provider Note   CSN: [id number] Arrival date & time: 05/08/23  4098     History  Chief Complaint  Patient presents with   Dental Injury    Gary Harrington is a 62 y.o. male with past medical history significant for hypertension, anxiety, tobacco abuse, left inguinal hernia, remote drug abuse who presents with concern for severe dental pain.  Patient reports that he is trying to wean himself from smoking 3 packs of cigarettes per day to half a pack so that he will be able to recover from his hernia repair surgery, he reports that he has been chewing candy to curb the craving and broke several teeth in the process.  Patient with known history of severe dental disease, reports that he has scheduled appointment to see a dentist, patient is concerned for developing infection and requests antibiotics.  He does not request any pain medication reports that he already has some at home.   Dental Injury       Home Medications Prior to Admission medications   Medication Sig Start Date End Date Taking? Authorizing Provider  clindamycin (CLEOCIN) 150 MG capsule Take 1 capsule (150 mg total) by mouth 3 (three) times daily. 05/08/23  Yes Emelly Wurtz H, PA-C  ALPRAZolam (XANAX) 1 MG tablet Take 1 mg by mouth daily.    [provider]  atorvastatin (LIPITOR) 40 MG tablet Take 1 tablet (40 mg total) by mouth daily. Patient not taking: Reported on 05/07/2023 01/20/22 04/16/23  Carroll Sage, PA-C  benzonatate (TESSALON) 100 MG capsule Take 1 capsule (100 mg total) by mouth 3 (three) times daily as needed for cough. Patient not taking: Reported on 05/07/2023 05/01/23   Lonell Grandchild, MD  hydrOXYzine (ATARAX/VISTARIL) 25 MG tablet Take 1 tablet (25 mg total) by mouth every 6 (six) hours as needed for itching. Patient not taking: Reported on 05/07/2023 05/19/20   Glynn Octave, MD  naproxen (NAPROSYN) 375 MG tablet Take 1  tablet (375 mg total) by mouth 2 (two) times daily. Patient not taking: Reported on 05/07/2023 02/15/23   Kommor, Wyn Forster, MD  oxyCODONE (ROXICODONE) 5 MG immediate release tablet Take 1 tablet (5 mg total) by mouth every 4 (four) hours as needed for severe pain or breakthrough pain. Patient taking differently: Take 5-7.5 mg by mouth in the morning, at noon, and at bedtime. 05/04/23   Lucretia Roers, MD  triamcinolone cream (KENALOG) 0.1 % Apply 1 Application topically 2 (two) times daily. Patient not taking: Reported on 05/07/2023 04/17/23   Eber Hong, MD  Vitamin D, Ergocalciferol, (DRISDOL) 1.25 MG (50000 UNIT) CAPS capsule Take 50,000 Units by mouth once a week. Patient not taking: Reported on 05/07/2023 10/31/21   [provider]      Allergies    Codeine, Other, Penicillins, and Prednisone    Review of Systems   Review of Systems  All other systems reviewed and are negative.   Physical Exam Updated Vital Signs BP 112/71 (BP Location: Left Arm)   Pulse 88   Temp 98 F (36.7 C) (Oral)   Resp 18   SpO2 99%  Physical Exam Vitals and nursing note reviewed.  Constitutional:      General: He is not in acute distress.    Appearance: Normal appearance.  HENT:     Head: Normocephalic and atraumatic.     Mouth/Throat:     Comments: Severe dental disease, multiple broken teeth in the mouth, gum  erythema.  He has no posterior oropharynx swelling, uvula midline.  Patient tolerating his secretions without difficulty. Eyes:     General:        Right eye: No discharge.        Left eye: No discharge.  Cardiovascular:     Rate and Rhythm: Normal rate and regular rhythm.     Heart sounds: No murmur heard.    No friction rub. No gallop.  Pulmonary:     Effort: Pulmonary effort is normal.     Breath sounds: Normal breath sounds.  Abdominal:     General: Bowel sounds are normal.     Palpations: Abdomen is soft.  Skin:    General: Skin is warm and dry.     Capillary  Refill: Capillary refill takes less than 2 seconds.  Neurological:     Mental Status: He is alert and oriented to person, place, and time.  Psychiatric:        Mood and Affect: Mood normal.        Behavior: Behavior normal.     ED Results / Procedures / Treatments   Labs (all labs ordered are listed, but only abnormal results are displayed) Labs Reviewed - No data to display  EKG None  Radiology No results found.  Procedures Procedures    Medications Ordered in ED Medications  clindamycin (CLEOCIN) capsule 150 mg (150 mg Oral Given 05/08/23 2034)    ED Course/ Medical Decision Making/ A&P                             Medical Decision Making Risk Prescription drug management.    This an overall well-appearing 62 y.o. male who presents with concern for dental pain.  Physical exam reveals cavities, broken teeth in mouth.  Patient with redness, gum swelling without evidence of gum abscess, periapical abscess, PTA, uvula deviation, pharyngitis, epiglottitis, dysphonia, stridor.  Patient without difficulty swallowing.  No systemic fever, chills.  Given patient's symptoms think it is reasonable to treat with antibiotics.  Encouraged ibuprofen, Tylenol, Orajel, ice for pain control. Encouraged urgent dental follow-up, dental resource guide provided.  Provided clindamycin for antibiotic coverage.  Patient discharged in stable condition at this time, return precautions given.  Final Clinical Impression(s) / ED Diagnoses Final diagnoses:  Pain due to dental caries    Rx / DC Orders ED Discharge Orders          Ordered    clindamycin (CLEOCIN) 150 MG capsule  3 times daily        05/08/23 2027              [place] 05/08/23 2049    Mardene Sayer, MD 05/09/23 [phone]
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
## 2023-05-08 NOTE — ED Provider Notes (Signed)
[facility] AT [facility] Provider Note  CSN: [id number] Arrival date & time: 05/08/23 0541  Chief Complaint(s) Insect Bite  HPI Gary Harrington is a 62 y.o. male with PMH COPD, HTN, CVA, chronic lower extremity wounds who presents emergency department for evaluation of a wound check.  I have personally seen this patient multiple times in the emergency department for these lower extremity wounds.  On our last ER visit, it was discovered that the patient has a large inguinal hernia and patient has since followed up with Dr. Henreitta Leber and is scheduled for hernia repair.  The patient has been doing appropriate wound care at home but because of his hernia he is unable to bend down and change his bandages.  Thus, patient returns for bandage change.  Here in the emergency room, his lower extremity wounds have significantly improved and are almost no open sores.  Denies chest pain, shortness of breath, fever or other systemic symptoms.   Past Medical History Past Medical History:  Diagnosis Date   Chronic pain    COPD (chronic obstructive pulmonary disease) (HCC)    Dizziness    Headache    Hypertension    Stroke [facility]) 2000   Patient Active Problem List   Diagnosis Date Noted   Incarcerated left inguinal hernia 04/16/2023   Anxiety disorder 06/17/2020   Chest pain 06/17/2020   Essential hypertension 06/17/2020   Hyperlipidemia 06/17/2020   Syncope 06/17/2020   Tobacco abuse 06/17/2020   Home Medication(s) Prior to Admission medications   Medication Sig Start Date End Date Taking? Authorizing Provider  ALPRAZolam Prudy Feeler) 1 MG tablet Take 1 mg by mouth daily.    [provider]  atorvastatin (LIPITOR) 40 MG tablet Take 1 tablet (40 mg total) by mouth daily. Patient not taking: Reported on 05/07/2023 01/20/22 04/16/23  Carroll Sage, PA-C  benzonatate (TESSALON) 100 MG capsule Take 1 capsule (100 mg total) by mouth 3 (three) times daily as needed  for cough. Patient not taking: Reported on 05/07/2023 05/01/23   Lonell Grandchild, MD  hydrOXYzine (ATARAX/VISTARIL) 25 MG tablet Take 1 tablet (25 mg total) by mouth every 6 (six) hours as needed for itching. Patient not taking: Reported on 05/07/2023 05/19/20   Glynn Octave, MD  naproxen (NAPROSYN) 375 MG tablet Take 1 tablet (375 mg total) by mouth 2 (two) times daily. Patient not taking: Reported on 05/07/2023 02/15/23   Tawni Melkonian, Wyn Forster, MD  oxyCODONE (ROXICODONE) 5 MG immediate release tablet Take 1 tablet (5 mg total) by mouth every 4 (four) hours as needed for severe pain or breakthrough pain. Patient taking differently: Take 5-7.5 mg by mouth in the morning, at noon, and at bedtime. 05/04/23   Lucretia Roers, MD  triamcinolone cream (KENALOG) 0.1 % Apply 1 Application topically 2 (two) times daily. Patient not taking: Reported on 05/07/2023 04/17/23   Eber Hong, MD  Vitamin D, Ergocalciferol, (DRISDOL) 1.25 MG (50000 UNIT) CAPS capsule Take 50,000 Units by mouth once a week. Patient not taking: Reported on 05/07/2023 10/31/21   [provider]  Past Surgical History Past Surgical History:  Procedure Laterality Date   TONSILLECTOMY  age 36   Family History Family History  Problem Relation Age of Onset   Stroke Father    Heart failure Father     Social History Social History   Tobacco Use   Smoking status: Every Day    Packs/day: 3.00    Years: 27.00    Additional pack years: 0.00    Total pack years: 81.00    Types: Cigarettes   Smokeless tobacco: Never  Vaping Use   Vaping Use: Never used  Substance Use Topics   Alcohol use: No   Drug use: No   Allergies Codeine, Other, Penicillins, and Prednisone  Review of Systems Review of Systems  Skin:  Positive for wound.    Physical Exam Vital Signs  I have reviewed the  triage vital signs BP 132/85 (BP Location: Right Arm)   Pulse 65   Temp 97.6 F (36.4 C)   Resp 18   Ht 5\' 9"  (1.753 m)   Wt 95.3 kg   SpO2 96%   BMI 31.01 kg/m   Physical Exam Constitutional:      General: He is not in acute distress.    Appearance: Normal appearance.  HENT:     Head: Normocephalic and atraumatic.     Nose: No congestion or rhinorrhea.  Eyes:     General:        Right eye: No discharge.        Left eye: No discharge.     Extraocular Movements: Extraocular movements intact.     Pupils: Pupils are equal, round, and reactive to light.  Cardiovascular:     Rate and Rhythm: Normal rate and regular rhythm.     Heart sounds: No murmur heard. Pulmonary:     Effort: No respiratory distress.     Breath sounds: No wheezing or rales.  Abdominal:     General: There is no distension.     Tenderness: There is no abdominal tenderness.  Musculoskeletal:        General: Normal range of motion.     Cervical back: Normal range of motion.  Skin:    General: Skin is warm and dry.     Findings: Rash present.  Neurological:     General: No focal deficit present.     Mental Status: He is alert.     ED Results and Treatments Labs (all labs ordered are listed, but only abnormal results are displayed) Labs Reviewed - No data to display                                                                                                                        Radiology No results found.  Pertinent labs & imaging results that were available during my care of the patient were reviewed by me and considered in my medical decision making (see MDM for details).  Medications Ordered in ED Medications - No data to  display                                                                                                                                   Procedures Procedures  (including critical care time)  Medical Decision Making / ED Course   This patient presents to the ED  for concern of wound check, this involves an extensive number of treatment options, and is a complaint that carries with it a high risk of complications and morbidity.  The differential diagnosis includes appropriately healing wounds, need for wound supplies, cellulitis, wound infection  MDM: Patient seen emergency room for evaluation of lower extremity wound check.  Physical exam reveals significantly improving lower extremity cellulitis with almost no open wounds.  Xeroform gauze placed over wounds and a bandage was placed.  Patient given additional wound care supplies and discharge with outpatient follow-up.   Additional history obtained: -External records from outside source obtained and reviewed including: Chart review including previous notes, labs, imaging, consultation notes   Lab Tests: -I ordered, reviewed, and interpreted labs.   The pertinent results include:   Labs Reviewed - No data to display    Medicines ordered and prescription drug management: No orders of the defined types were placed in this encounter.   -I have reviewed the patients home medicines and have made adjustments as needed  Critical interventions none    Cardiac Monitoring: The patient was maintained on a cardiac monitor.  I personally viewed and interpreted the cardiac monitored which showed an underlying rhythm of: NSR  Social Determinants of Health:  Factors impacting patients care include: none   Reevaluation: After the interventions noted above, I reevaluated the patient and found that they have :improved  Co morbidities that complicate the patient evaluation  Past Medical History:  Diagnosis Date   Chronic pain    COPD (chronic obstructive pulmonary disease) (HCC)    Dizziness    Headache    Hypertension    Stroke (HCC) 2000      Dispostion: I considered admission for this patient, but at this time he does not meet inpatient criteria for admission and he is safe for discharge with  outpatient follow-up     Final Clinical Impression(s) / ED Diagnoses Final diagnoses:  Visit for wound check     @PCDICTATION @    Glendora Score, MD 05/08/23 1701

## 2023-05-08 NOTE — Discharge Instructions (Addendum)
Please use Tylenol or ibuprofen for pain.  You may use 600 mg ibuprofen every 6 hours or 1000 mg of Tylenol every 6 hours.  You may choose to alternate between the 2.  This would be most effective.  Not to exceed 4 g of Tylenol within 24 hours.  Not to exceed 3200 mg ibuprofen 24 hours.  

## 2023-05-11 ENCOUNTER — Telehealth (INDEPENDENT_AMBULATORY_CARE_PROVIDER_SITE_OTHER): Payer: Medicaid Other | Admitting: General Surgery

## 2023-05-11 ENCOUNTER — Encounter (HOSPITAL_COMMUNITY): Payer: Self-pay

## 2023-05-11 ENCOUNTER — Encounter (HOSPITAL_COMMUNITY)
Admission: RE | Admit: 2023-05-11 | Discharge: 2023-05-11 | Disposition: A | Discharge status: Home / Self Care | Payer: Medicaid Other | Source: Ambulatory Visit | Attending: General Surgery | Admitting: General Surgery

## 2023-05-11 ENCOUNTER — Other Ambulatory Visit: Payer: Self-pay

## 2023-05-11 DIAGNOSIS — K4030 Unilateral inguinal hernia, with obstruction, without gangrene, not specified as recurrent: Secondary | ICD-10-CM

## 2023-05-11 NOTE — Patient Instructions (Signed)
Gary Harrington  05/11/2023     @PREFPERIOPPHARMACY @   Your procedure is scheduled on  05/13/2023.   Report to [facility] at  1050  A.M.   Call this number if you have problems the morning of surgery:  [phone]  If you experience any cold or flu symptoms such as cough, fever, chills, shortness of breath, etc. between now and your scheduled surgery, please notify us at the above number.   Remember:  Do not eat or drink after midnight.      Take these medicines the morning of surgery with A SIP OF WATER                      xanax (if needed), oxycodone.     Do not wear jewelry, make-up or nail polish.  Do not wear lotions, powders, or perfumes, or deodorant.  Do not shave 48 hours prior to surgery.  Men may shave face and neck.  Do not bring valuables to the hospital.  [facility] is not responsible for any belongings or valuables.  Contacts, dentures or bridgework may not be worn into surgery.  Leave your suitcase in the car.  After surgery it may be brought to your room.  For patients admitted to the hospital, discharge time will be determined by your treatment team.  Patients discharged the day of surgery will not be allowed to drive home and must  have someone with them for 24 hours.    Special instructions:   DO NOT smoke tobacco or vape for 24 hours before your procedure.  Please read over the following fact sheets that you were given. Coughing and Deep Breathing, Surgical Site Infection Prevention, Anesthesia Post-op Instructions, and Care and Recovery After Surgery        Laparoscopic Inguinal Hernia Repair, Adult, Care After The following information offers guidance on how to care for yourself after your procedure. Your health care provider may also give you more specific instructions. If you have problems or questions, contact your health care provider. What can I expect after the procedure? After the procedure, it is common to  have: Pain. Swelling and bruising around the incision area. Scrotal swelling, in males. Some fluid or blood draining from your incisions. Follow these instructions at home: Medicines Take over-the-counter and prescription medicines only as told by your health care provider. Ask your health care provider if the medicine prescribed to you: Requires you to avoid driving or using machinery. Can cause constipation. You may need to take these actions to prevent or treat constipation: Drink enough fluid to keep your urine pale yellow. Take over-the-counter or prescription medicines. Eat foods that are high in fiber, such as beans, whole grains, and fresh fruits and vegetables. Limit foods that are high in fat and processed sugars, such as fried or sweet foods. Incision care  Follow instructions from your health care provider about how to take care of your incisions. Make sure you: Wash your hands with soap and water for at least 20 seconds before and after you change your bandage (dressing). If soap and water are not available, use hand sanitizer. Change your dressing as told by your health care provider. Leave stitches (sutures), skin glue, or adhesive strips in place. These skin closures may need to stay in place for 2 weeks or longer. If adhesive strip edges start to loosen and curl up, you may trim the loose edges. Do not remove  adhesive strips completely unless your health care provider tells you to do that. Check your incision area every day for signs of infection. Check for: More redness, swelling, or pain. More fluid or blood. Warmth. Pus or a bad smell. Wear loose, soft clothing while your incisions heal. Managing pain and swelling If directed, put ice on the painful or swollen areas. To do this: Put ice in a plastic bag. Place a towel between your skin and the bag. Leave the ice on for 20 minutes, 2-3 times a day. Remove the ice if your skin turns bright red. This is very  important. If you cannot feel pain, heat, or cold, you have a greater risk of damage to the area.  Activity Do not lift anything that is heavier than 10 lb (4.5 kg), or the limit that you are told, until your health care provider says that it is safe. Ask your health care provider what activities are safe for you. A lot of activity during the first week after surgery can increase pain and swelling. For 1 week after your procedure: Avoid activities that take a lot of effort, such as exercise or sports. You may walk and climb stairs as needed for daily activity, but avoid long walks or climbing stairs for exercise. General instructions If you were given a sedative during the procedure, it can affect you for several hours. Do not drive or operate machinery until your health care provider says that it is safe. Do not take baths, swim, or use a hot tub until your health care provider approves. Ask your health care provider if you may take showers. You may only be allowed to take sponge baths. Do not use any products that contain nicotine or tobacco. These products include cigarettes, chewing tobacco, and vaping devices, such as e-cigarettes. If you need help quitting, ask your health care provider. Keep all follow-up visits. This is important. Contact a health care provider if: You have any of these signs of infection: More redness, swelling, or pain around your incisions or your groin area. More fluid or blood coming from an incision. Warmth coming from an incision. Pus or a bad smell coming from an incision. A fever or chills. You have more swelling in your scrotum, if you are male. You have severe pain and medicines do not help. You have abdominal pain or swelling. You cannot urinate or have a bowel movement. You faint or feel dizzy. You have nausea and vomiting. Get help right away if: You have redness, warmth, or pain in your leg. You have chest pain. You have problems breathing. These  symptoms may represent a serious problem that is an emergency. Do not wait to see if the symptoms will go away. Get medical help right away. Call your local emergency services (911 in the [place] Do not drive yourself to the hospital. Summary Pain, swelling, and bruising are common after the procedure. Check your incision area every day for signs of infection, such as more redness, swelling, or pain. Put ice on painful or swollen areas for 20 minutes, 2-3 times a day. This information is not intended to replace advice given to you by your health care provider. Make sure you discuss any questions you have with your health care provider. Document Revised: 08/07/2020 Document Reviewed: 08/07/2020 Elsevier Patient Education  2023 Else[employer] Inc. General Anesthesia, Adult, Care After The following information offers guidance on how to care for yourself after your procedure. Your health care provider may also give you more  specific instructions. If you have problems or questions, contact your health care provider. What can I expect after the procedure? After the procedure, it is common for people to: Have pain or discomfort at the IV site. Have nausea or vomiting. Have a sore throat or hoarseness. Have trouble concentrating. Feel cold or chills. Feel weak, sleepy, or tired (fatigue). Have soreness and body aches. These can affect parts of the body that were not involved in surgery. Follow these instructions at home: For the time period you were told by your health care provider:  Rest. Do not participate in activities where you could fall or become injured. Do not drive or use machinery. Do not drink alcohol. Do not take sleeping pills or medicines that cause drowsiness. Do not make important decisions or sign legal documents. Do not take care of children on your own. General instructions Drink enough fluid to keep your urine pale yellow. If you have sleep apnea, surgery and certain medicines  can increase your risk for breathing problems. Follow instructions from your health care provider about wearing your sleep device: Anytime you are sleeping, including during daytime naps. While taking prescription pain medicines, sleeping medicines, or medicines that make you drowsy. Return to your normal activities as told by your health care provider. Ask your health care provider what activities are safe for you. Take over-the-counter and prescription medicines only as told by your health care provider. Do not use any products that contain nicotine or tobacco. These products include cigarettes, chewing tobacco, and vaping devices, such as e-cigarettes. These can delay incision healing after surgery. If you need help quitting, ask your health care provider. Contact a health care provider if: You have nausea or vomiting that does not get better with medicine. You vomit every time you eat or drink. You have pain that does not get better with medicine. You cannot urinate or have bloody urine. You develop a skin rash. You have a fever. Get help right away if: You have trouble breathing. You have chest pain. You vomit blood. These symptoms may be an emergency. Get help right away. Call 911. Do not wait to see if the symptoms will go away. Do not drive yourself to the hospital. Summary After the procedure, it is common to have a sore throat, hoarseness, nausea, vomiting, or to feel weak, sleepy, or fatigue. For the time period you were told by your health care provider, do not drive or use machinery. Get help right away if you have difficulty breathing, have chest pain, or vomit blood. These symptoms may be an emergency. This information is not intended to replace advice given to you by your health care provider. Make sure you discuss any questions you have with your health care provider. Document Revised: 03/07/2022 Document Reviewed: 03/07/2022 Elsevier Patient Education  2023 Elsevier  Inc. How to Use Chlorhexidine Before Surgery Chlorhexidine gluconate (CHG) is a germ-killing (antiseptic) solution that is used to clean the skin. It can get rid of the bacteria that normally live on the skin and can keep them away for about 24 hours. To clean your skin with CHG, you may be given: A CHG solution to use in the shower or as part of a sponge bath. A prepackaged cloth that contains CHG. Cleaning your skin with CHG may help lower the risk for infection: While you are staying in the intensive care unit of the hospital. If you have a vascular access, such as a central line, to provide short-term or long-term access  to your veins. If you have a catheter to drain urine from your bladder. If you are on a ventilator. A ventilator is a machine that helps you breathe by moving air in and out of your lungs. After surgery. What are the risks? Risks of using CHG include: A skin reaction. Hearing loss, if CHG gets in your ears and you have a perforated eardrum. Eye injury, if CHG gets in your eyes and is not rinsed out. The CHG product catching fire. Make sure that you avoid smoking and flames after applying CHG to your skin. Do not use CHG: If you have a chlorhexidine allergy or have previously reacted to chlorhexidine. On babies younger than 69 months of age. How to use CHG solution Use CHG only as told by your health care provider, and follow the instructions on the label. Use the full amount of CHG as directed. Usually, this is one bottle. During a shower Follow these steps when using CHG solution during a shower (unless your health care provider gives you different instructions): Start the shower. Use your normal soap and shampoo to wash your face and hair. Turn off the shower or move out of the shower stream. Pour the CHG onto a clean washcloth. Do not use any type of brush or rough-edged sponge. Starting at your neck, lather your body down to your toes. Make sure you follow these  instructions: If you will be having surgery, pay special attention to the part of your body where you will be having surgery. Scrub this area for at least 1 minute. Do not use CHG on your head or face. If the solution gets into your ears or eyes, rinse them well with water. Avoid your genital area. Avoid any areas of skin that have broken skin, cuts, or scrapes. Scrub your back and under your arms. Make sure to wash skin folds. Let the lather sit on your skin for 1-2 minutes or as long as told by your health care provider. Thoroughly rinse your entire body in the shower. Make sure that all body creases and crevices are rinsed well. Dry off with a clean towel. Do not put any substances on your body afterward--such as powder, lotion, or perfume--unless you are told to do so by your health care provider. Only use lotions that are recommended by the manufacturer. Put on clean clothes or pajamas. If it is the night before your surgery, sleep in clean sheets.  During a sponge bath Follow these steps when using CHG solution during a sponge bath (unless your health care provider gives you different instructions): Use your normal soap and shampoo to wash your face and hair. Pour the CHG onto a clean washcloth. Starting at your neck, lather your body down to your toes. Make sure you follow these instructions: If you will be having surgery, pay special attention to the part of your body where you will be having surgery. Scrub this area for at least 1 minute. Do not use CHG on your head or face. If the solution gets into your ears or eyes, rinse them well with water. Avoid your genital area. Avoid any areas of skin that have broken skin, cuts, or scrapes. Scrub your back and under your arms. Make sure to wash skin folds. Let the lather sit on your skin for 1-2 minutes or as long as told by your health care provider. Using a different clean, wet washcloth, thoroughly rinse your entire body. Make sure that  all body creases and crevices  are rinsed well. Dry off with a clean towel. Do not put any substances on your body afterward--such as powder, lotion, or perfume--unless you are told to do so by your health care provider. Only use lotions that are recommended by the manufacturer. Put on clean clothes or pajamas. If it is the night before your surgery, sleep in clean sheets. How to use CHG prepackaged cloths Only use CHG cloths as told by your health care provider, and follow the instructions on the label. Use the CHG cloth on clean, dry skin. Do not use the CHG cloth on your head or face unless your health care provider tells you to. When washing with the CHG cloth: Avoid your genital area. Avoid any areas of skin that have broken skin, cuts, or scrapes. Before surgery Follow these steps when using a CHG cloth to clean before surgery (unless your health care provider gives you different instructions): Using the CHG cloth, vigorously scrub the part of your body where you will be having surgery. Scrub using a back-and-forth motion for 3 minutes. The area on your body should be completely wet with CHG when you are done scrubbing. Do not rinse. Discard the cloth and let the area air-dry. Do not put any substances on the area afterward, such as powder, lotion, or perfume. Put on clean clothes or pajamas. If it is the night before your surgery, sleep in clean sheets.  For general bathing Follow these steps when using CHG cloths for general bathing (unless your health care provider gives you different instructions). Use a separate CHG cloth for each area of your body. Make sure you wash between any folds of skin and between your fingers and toes. Wash your body in the following order, switching to a new cloth after each step: The front of your neck, shoulders, and chest. Both of your arms, under your arms, and your hands. Your stomach and groin area, avoiding the genitals. Your right leg and  foot. Your left leg and foot. The back of your neck, your back, and your buttocks. Do not rinse. Discard the cloth and let the area air-dry. Do not put any substances on your body afterward--such as powder, lotion, or perfume--unless you are told to do so by your health care provider. Only use lotions that are recommended by the manufacturer. Put on clean clothes or pajamas. Contact a health care provider if: Your skin gets irritated after scrubbing. You have questions about using your solution or cloth. You swallow any chlorhexidine. Call your local poison control center (718 024 3899 in the [place] Get help right away if: Your eyes itch badly, or they become very red or swollen. Your skin itches badly and is red or swollen. Your hearing changes. You have trouble seeing. You have swelling or tingling in your mouth or throat. You have trouble breathing. These symptoms may represent a serious problem that is an emergency. Do not wait to see if the symptoms will go away. Get medical help right away. Call your local emergency services (911 in the [place] Do not drive yourself to the hospital. Summary Chlorhexidine gluconate (CHG) is a germ-killing (antiseptic) solution that is used to clean the skin. Cleaning your skin with CHG may help to lower your risk for infection. You may be given CHG to use for bathing. It may be in a bottle or in a prepackaged cloth to use on your skin. Carefully follow your health care provider's instructions and the instructions on the product label. Do not use CHG  if you have a chlorhexidine allergy. Contact your health care provider if your skin gets irritated after scrubbing. This information is not intended to replace advice given to you by your health care provider. Make sure you discuss any questions you have with your health care provider. Document Revised: 04/07/2022 Document Reviewed: 02/18/2021 Elsevier Patient Education  2023 [employer].

## 2023-05-11 NOTE — Telephone Encounter (Signed)
[facility]  OR moved patient's case up and Gary Harrington is worried his dad has a cough still. He had a temperature of 99.  Discussed that he was doxcycline from 5/10 for the cough. Told them if anything gets worse let us know. He came to his preop this AM. Case is now posted for 1055 on Wednesday. If we have any worries Wednesday we can always get a repeat CXR.   Told them if things worsened to the point that his fever was going up or breathing became an issue to go to the ED.  Algis Greenhouse, MD [facility] [address] [place] [place] [place], [place] [address] [phone] (office)

## 2023-05-12 ENCOUNTER — Encounter (HOSPITAL_COMMUNITY): Payer: Self-pay | Admitting: Anesthesiology

## 2023-05-13 ENCOUNTER — Telehealth (INDEPENDENT_AMBULATORY_CARE_PROVIDER_SITE_OTHER): Payer: Medicaid Other | Admitting: General Surgery

## 2023-05-13 ENCOUNTER — Encounter (HOSPITAL_COMMUNITY): Admission: RE | Payer: Self-pay | Source: Home / Self Care

## 2023-05-13 ENCOUNTER — Ambulatory Visit (HOSPITAL_COMMUNITY): Admission: RE | Admit: 2023-05-13 | Payer: Medicaid Other | Source: Home / Self Care | Admitting: General Surgery

## 2023-05-13 ENCOUNTER — Telehealth: Payer: Self-pay | Admitting: *Deleted

## 2023-05-13 DIAGNOSIS — K4090 Unilateral inguinal hernia, without obstruction or gangrene, not specified as recurrent: Secondary | ICD-10-CM

## 2023-05-13 DIAGNOSIS — K4030 Unilateral inguinal hernia, with obstruction, without gangrene, not specified as recurrent: Secondary | ICD-10-CM | POA: Diagnosis present

## 2023-05-13 SURGERY — REPAIR, HERNIA, INGUINAL, ROBOT-ASSISTED, LAPAROSCOPIC, USING MESH
Anesthesia: General | Laterality: Left

## 2023-05-13 MED ORDER — OXYCODONE HCL 5 MG PO TABS: 5.0000 mg | ORAL_TABLET | Freq: Four times a day (QID) | ORAL | 0 refills | Status: DC | PRN

## 2023-05-13 NOTE — Telephone Encounter (Signed)
Received call from patient son, Molly Maduro [phone] telephone.   Reports that patient cough continues and now others in the home have been diagnosed with pneumonia. Requested to re-schedule surgery.   Advised to have patient go to hospital as Dr. Henreitta Leber recommended and CXR can be ordered.   Advised if patient cough is worse, productive, or if patient is running a fever to go to ER for evaluation.

## 2023-05-13 NOTE — Telephone Encounter (Signed)
Call placed to patient.   LM on VM.

## 2023-05-13 NOTE — Telephone Encounter (Signed)
[facility]  Patient with worsening cough and concern he has PNA. He is seeing PCP next week. Delayed case until 6/5. 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 He is needing narcotics to help him get comfortable at times.  Will give him 1 additional refill between now and surgery and advise him if worsening pain, obstructive symptoms, worsening respiratory symptoms to go to the ED.  Algis Greenhouse, MD [facility] [address] [place], [place] [address] [phone] (office)

## 2023-05-19 ENCOUNTER — Telehealth: Payer: Self-pay | Admitting: *Deleted

## 2023-05-19 NOTE — Telephone Encounter (Signed)
Received call from patient son, Molly Maduro [phone] telephone.   Reports that patient requires refill on pain medication.   Last prescription given on 05/13/2023 with fill date of 05/14/2023, # 40 tablets.   Patient and son were both advised that last prescription was given to last until surgery date of 06/05.  Advised that if patient is having worsening pain, he should go to ER for evaluation.   Verbalized understanding.

## 2023-05-20 ENCOUNTER — Telehealth: Payer: Self-pay | Admitting: *Deleted

## 2023-05-20 NOTE — Telephone Encounter (Signed)
Received call from patient [phone]~ telephone.   Patient reports that he is completely out of pain medication at this time. Again advised that Dr. Henreitta Leber recommended that last prescription from 05/13/2023 was to last until surgery. Also advised that Dr. Henreitta Leber is out of the office but due to return on 05/21/2023. Advised that all calls are documented and provider will be made aware.  Advised that if his pain is worsening, he can go to ER for evaluation.   Patient states that he did see PCP in [place], [place] on 05/19/2023 for cough. Reports that ABTx was sent to pharmacy, but he has not picked it up at this time. States that he is waiting on his SSI check to be able to afford prescription. States that he should receive his funds wither 05/29 or 05/30. Call placed to pharmacy. Was advised that Z-Pack was sent in cough. Co-pay is $4.

## 2023-05-21 ENCOUNTER — Telehealth (INDEPENDENT_AMBULATORY_CARE_PROVIDER_SITE_OTHER): Payer: Medicaid Other | Admitting: General Surgery

## 2023-05-21 DIAGNOSIS — K4030 Unilateral inguinal hernia, with obstruction, without gangrene, not specified as recurrent: Secondary | ICD-10-CM

## 2023-05-21 MED ORDER — OXYCODONE HCL 5 MG PO TABS: 5.0000 mg | ORAL_TABLET | Freq: Four times a day (QID) | ORAL | 0 refills | Status: DC | PRN

## 2023-05-21 NOTE — Telephone Encounter (Signed)
[facility]  Last roxicodone rx before surgery sent in. He needs to get his zpack. Surgery next week.  No future meds sent in unless he has surgery.  Algis Greenhouse, MD [facility] [address] [place] [place] [place], [place] [address] [phone] (office)

## 2023-05-21 NOTE — Telephone Encounter (Signed)
Call placed to patient and patient son Gary Harrington made aware.   Advised that prescription must last until surgery. No further prescriptions will be given until surgery is completed.   Gary Harrington also states that patient picked up Z Pack and began taking on 05/21/2023.

## 2023-05-21 NOTE — Telephone Encounter (Signed)
I can send in a refill to last until next week, he needs to get the Zpack ASAP.

## 2023-05-22 ENCOUNTER — Encounter (HOSPITAL_COMMUNITY)
Admission: RE | Admit: 2023-05-22 | Discharge: 2023-05-22 | Disposition: A | Discharge status: Home / Self Care | Payer: Medicaid Other | Source: Ambulatory Visit | Attending: General Surgery | Admitting: General Surgery

## 2023-05-22 ENCOUNTER — Encounter (HOSPITAL_COMMUNITY): Payer: Self-pay

## 2023-05-22 HISTORY — DX: Depression, unspecified: F32.A

## 2023-05-22 HISTORY — DX: Anxiety disorder, unspecified: F41.9

## 2023-05-27 ENCOUNTER — Encounter (HOSPITAL_COMMUNITY): Payer: Self-pay | Admitting: Certified Registered"

## 2023-05-27 ENCOUNTER — Telehealth: Payer: Self-pay | Admitting: *Deleted

## 2023-05-27 DIAGNOSIS — K4030 Unilateral inguinal hernia, with obstruction, without gangrene, not specified as recurrent: Secondary | ICD-10-CM | POA: Diagnosis present

## 2023-05-27 MED ORDER — ROCURONIUM BROMIDE 10 MG/ML (PF) SYRINGE
PREFILLED_SYRINGE | INTRAVENOUS | Status: AC
  Filled 2023-05-27: qty 20

## 2023-05-27 NOTE — Telephone Encounter (Signed)
LATE DOCUMENTATION FOR 05/26/2023:  Received call from patient son, Molly Maduro [phone] telephone.   Patient son reports that patient has increased anxiety and appears to be having panic attack regarding scheduled surgery on 05/27/2023.  Patient noted to be prescribed Xanax 1mg  PO QD from PCP.   Discussed with Dr. Henreitta Leber. Recommended that patient take morning dose of Xanax for 06/05 at bedtime to help ease anxiety. Advised that hospital will give medication for anxiety prior to surgery.   Molly Maduro inquired if provider will prescribe a couple Xanax for patient prior to surgery. Advised that Dr. Henreitta Leber will not prescribe any additional Xanax.   Also advised patient that no further pain medications will be prescribed until surgery is completed. Verbalized understanding.

## 2023-05-27 NOTE — Telephone Encounter (Addendum)
Patient did not show for surgery on 05/27/2023.   Per Dr. Henreitta Leber, no further medications will be prescribed until after surgery is completed.   Dr. Henreitta Leber recommends OV prior to re-scheduling procedure.   Procedure: XI ROBOTIC ASSISTED INGUINAL HERNIA REPAIR WITH MESH, LEFT CPT: 69629 Dx: K40.30- Left Inguinal Hernia, Incarcerated (4.5 CM)

## 2023-05-28 ENCOUNTER — Telehealth: Payer: Self-pay | Admitting: *Deleted

## 2023-05-28 NOTE — Telephone Encounter (Signed)
Received call from patient [phone]~ telephone.   Patient reports that he had been choked on some chicken on Sunday night and son Molly Maduro had to perform Heimlich procedure. States that due to procedure, his ribs were "crushed" and therefore, sore. States that he missed scheduled surgery due to rib pain.   Requested to reschedule. Advised that provider requested OV. Appointment scheduled.

## 2023-05-28 NOTE — Telephone Encounter (Signed)
Received call from patient [phone]~ telephone.   Patient requested refill on pain medication. Again advised that no further pain medication will be prescribed until procedure completed.   Patient inquired if Dr. Henreitta Leber would prescribe "just a few pills" after his appointment on 06/10/2023. Advised that he would have to discuss with provider at appointment.

## 2023-05-29 ENCOUNTER — Other Ambulatory Visit: Payer: Self-pay

## 2023-05-29 ENCOUNTER — Emergency Department (HOSPITAL_COMMUNITY)
Admission: EM | Admit: 2023-05-29 | Discharge: 2023-05-29 | Disposition: A | Discharge status: Home / Self Care | Payer: Medicaid Other | Attending: Emergency Medicine | Admitting: Emergency Medicine

## 2023-05-29 ENCOUNTER — Encounter (HOSPITAL_COMMUNITY): Payer: Self-pay

## 2023-05-29 DIAGNOSIS — K4090 Unilateral inguinal hernia, without obstruction or gangrene, not specified as recurrent: Secondary | ICD-10-CM | POA: Insufficient documentation

## 2023-05-29 DIAGNOSIS — Z760 Encounter for issue of repeat prescription: Secondary | ICD-10-CM | POA: Insufficient documentation

## 2023-05-29 MED ORDER — OXYCODONE HCL 5 MG PO TABS: 5.0000 mg | ORAL_TABLET | Freq: Four times a day (QID) | ORAL | 0 refills | Status: DC | PRN

## 2023-05-29 NOTE — ED Provider Notes (Signed)
[place] EMERGENCY DEPARTMENT AT [facility] Provider Note   CSN: [id number] Arrival date & time: 05/29/23  4098     History  Chief Complaint  Patient presents with   Medication Refill    Gary Harrington is a 61 y.o. male.  He has a history of a left inguinal hernia that was close to be repaired last week but had to be rescheduled for later this month.  He is out of pain medication and states the surgeon will represcribe him medication until she sees him.  Complaining of worsening hernia pain.  Said a few days ago he was choking on some meat and his son had to do the Heimlich, which aggravated his hernia pain.  Patient does have a care plan in place with frequent visits for painful conditions.  The history is provided by the patient.  Medication Refill Medications/supplies requested:  Oxycodone Reason for request:  Medications ran out Medications taken before: yes - see home medications   Patient has complete original prescription information: yes        Home Medications Prior to Admission medications   Medication Sig Start Date End Date Taking? Authorizing Provider  ALPRAZolam Prudy Feeler) 1 MG tablet Take 1 mg by mouth daily.    [provider]  atorvastatin (LIPITOR) 40 MG tablet Take 1 tablet (40 mg total) by mouth daily. Patient not taking: Reported on 05/07/2023 01/20/22 04/16/23  Carroll Sage, PA-C  benzonatate (TESSALON) 100 MG capsule Take 1 capsule (100 mg total) by mouth 3 (three) times daily as needed for cough. Patient not taking: Reported on 05/07/2023 05/01/23   Lonell Grandchild, MD  clindamycin (CLEOCIN) 150 MG capsule Take 1 capsule (150 mg total) by mouth 3 (three) times daily. 05/08/23   Prosperi, Christian H, PA-C  naproxen (NAPROSYN) 375 MG tablet Take 1 tablet (375 mg total) by mouth 2 (two) times daily. Patient not taking: Reported on 05/07/2023 02/15/23   Kommor, Wyn Forster, MD  oxyCODONE (ROXICODONE) 5 MG immediate release tablet Take 1-2  tablets (5-10 mg total) by mouth every 6 (six) hours as needed for severe pain or breakthrough pain. 05/21/23   Lucretia Roers, MD  triamcinolone cream (KENALOG) 0.1 % Apply 1 Application topically 2 (two) times daily. Patient not taking: Reported on 05/07/2023 04/17/23   Eber Hong, MD      Allergies    Codeine, Other, Penicillins, and Prednisone    Review of Systems   Review of Systems  Constitutional:  Negative for fever.  Gastrointestinal:  Negative for nausea and vomiting.  Genitourinary:  Positive for scrotal swelling.    Physical Exam Updated Vital Signs BP 127/87 (BP Location: Left Arm)   Pulse 88   Temp 97.8 F (36.6 C) (Oral)   Resp 18   Ht 5\' 9"  (1.753 m)   Wt 95.2 kg   SpO2 100%   BMI 31.01 kg/m  Physical Exam Vitals and nursing note reviewed.  Constitutional:      Appearance: Normal appearance. He is well-developed.  HENT:     Head: Normocephalic and atraumatic.  Eyes:     Conjunctiva/sclera: Conjunctivae normal.  Pulmonary:     Effort: Pulmonary effort is normal.  Abdominal:     Tenderness: There is no abdominal tenderness. There is no guarding or rebound.     Hernia: A hernia (tender left inguinal into scrotum, soft, no evidence incarceration) is present.  Musculoskeletal:     Cervical back: Neck supple.  Skin:  General: Skin is warm and dry.  Neurological:     Mental Status: He is alert.     GCS: GCS eye subscore is 4. GCS verbal subscore is 5. GCS motor subscore is 6.     ED Results / Procedures / Treatments   Labs (all labs ordered are listed, but only abnormal results are displayed) Labs Reviewed - No data to display  EKG None  Radiology No results found.  Procedures Procedures    Medications Ordered in ED Medications - No data to display  ED Course/ Medical Decision Making/ A&P                             Medical Decision Making Risk Prescription drug management.   63 year old male planing of worsening of his  inguinal hernia on the left.  He is run out of his oral narcotics and general surgery is not refilling them.  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  Review of his ED visits and PMP shows that he is in the ED fairly frequently for painful conditions requesting narcotic pain medication.  On exam he does not have evidence of an incarcerated hernia but he is quite tender.  Will provide him prescription for 8 oxycodone and recommended any further pain medications be prescribed by his surgeon or primary care doctor.        Final Clinical Impression(s) / ED Diagnoses Final diagnoses:  Encounter for medication refill  Inguinal hernia of left side without obstruction or gangrene    Rx / DC Orders ED Discharge Orders          Ordered    oxyCODONE (ROXICODONE) 5 MG immediate release tablet  Every 6 hours PRN        05/29/23 0740              Terrilee Files, MD 05/29/23 [phone]

## 2023-05-29 NOTE — ED Triage Notes (Signed)
Pt comes in today b/c he is out of pain medication for his hernia pain. Pt was scheduled for surgery end of May and pt states he got sick and they could not do the anesthesia for surgery. Pt states he goes back to the surgeon on 06/10/23 to find out a new date but he can not tolerate the pain. Pt states hernia lt groin area.

## 2023-05-29 NOTE — Discharge Instructions (Signed)
You were seen in the emergency department for continued pain in your inguinal hernia.  We are prescribing you a short course of pain medication.  Please see your surgeon or primary care doctor for further refills.

## 2023-06-01 ENCOUNTER — Emergency Department (HOSPITAL_COMMUNITY)
Admission: EM | Admit: 2023-06-01 | Discharge: 2023-06-01 | Disposition: A | Discharge status: Home / Self Care | Payer: Medicaid Other | Attending: Emergency Medicine | Admitting: Emergency Medicine

## 2023-06-01 ENCOUNTER — Telehealth: Payer: Self-pay | Admitting: *Deleted

## 2023-06-01 ENCOUNTER — Other Ambulatory Visit: Payer: Self-pay

## 2023-06-01 ENCOUNTER — Encounter (HOSPITAL_COMMUNITY): Payer: Self-pay | Admitting: *Deleted

## 2023-06-01 DIAGNOSIS — Z760 Encounter for issue of repeat prescription: Secondary | ICD-10-CM | POA: Diagnosis not present

## 2023-06-01 DIAGNOSIS — K4090 Unilateral inguinal hernia, without obstruction or gangrene, not specified as recurrent: Secondary | ICD-10-CM | POA: Insufficient documentation

## 2023-06-01 MED ORDER — OXYCODONE HCL 5 MG PO TABS
5.0000 mg | ORAL_TABLET | Freq: Once | ORAL | Status: AC
  Administered 2023-06-01: 5 mg via ORAL
  Filled 2023-06-01: qty 1

## 2023-06-01 NOTE — ED Provider Notes (Signed)
[place] EMERGENCY DEPARTMENT AT [facility] Provider Note   CSN: [id number] Arrival date & time: 06/01/23  4098     History  Chief Complaint  Patient presents with   Medication Refill    Gary Harrington is a 62 y.o. male.  HPI 62 year old male presents requesting pain medications for his left inguinal hernia.  He had this for years.  He was due for surgery but a few days ago choked on chicken and had to miss the surgery.  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  He is now due to have surgery later in the month and has run out of oxycodone.  He last had it refilled 3 days ago in the ED.  The pain waxes and wanes but is not different from baseline.  It is not new or worse and there is no vomiting.  Home Medications Prior to Admission medications   Medication Sig Start Date End Date Taking? Authorizing Provider  ALPRAZolam Prudy Feeler) 1 MG tablet Take 1 mg by mouth daily.    [provider]  atorvastatin (LIPITOR) 40 MG tablet Take 1 tablet (40 mg total) by mouth daily. Patient not taking: Reported on 05/07/2023 01/20/22 04/16/23  Carroll Sage, PA-C  benzonatate (TESSALON) 100 MG capsule Take 1 capsule (100 mg total) by mouth 3 (three) times daily as needed for cough. Patient not taking: Reported on 05/07/2023 05/01/23   Lonell Grandchild, MD  clindamycin (CLEOCIN) 150 MG capsule Take 1 capsule (150 mg total) by mouth 3 (three) times daily. 05/08/23   Prosperi, Christian H, PA-C  naproxen (NAPROSYN) 375 MG tablet Take 1 tablet (375 mg total) by mouth 2 (two) times daily. Patient not taking: Reported on 05/07/2023 02/15/23   Kommor, Wyn Forster, MD  oxyCODONE (ROXICODONE) 5 MG immediate release tablet Take 1 tablet (5 mg total) by mouth every 6 (six) hours as needed for severe pain or breakthrough pain. 05/29/23   Terrilee Files, MD  triamcinolone cream (KENALOG) 0.1 % Apply 1 Application topically 2 (two) times daily. Patient not taking:  Reported on 05/07/2023 04/17/23   Eber Hong, MD      Allergies    Codeine, Other, Penicillins, and Prednisone    Review of Systems   Review of Systems  Gastrointestinal:  Negative for abdominal pain and vomiting.    Physical Exam Updated Vital Signs BP 125/88 (BP Location: Left Arm)   Pulse 84   Temp 97.7 F (36.5 C) (Oral)   Resp 18   Ht 5\' 9"  (1.753 m)   Wt 92.1 kg   SpO2 96%   BMI 29.98 kg/m  Physical Exam Vitals and nursing note reviewed.  Constitutional:      Appearance: He is well-developed.  HENT:     Head: Normocephalic and atraumatic.  Pulmonary:     Effort: Pulmonary effort is normal.  Abdominal:     General: There is no distension.     Palpations: Abdomen is soft.     Tenderness: There is no abdominal tenderness.  Genitourinary:    Comments: Left inguinal hernia going into scrotum with swelling. This is baseline per patient. No evidence of strangulation Skin:    General: Skin is warm and dry.  Neurological:     Mental Status: He is alert.     ED Results / Procedures / Treatments   Labs (all labs ordered are listed, but only abnormal results are displayed) Labs Reviewed - No data to display  EKG  None  Radiology No results found.  Procedures Procedures    Medications Ordered in ED Medications  oxyCODONE (Oxy IR/ROXICODONE) immediate release tablet 5 mg (has no administration in time range)    ED Course/ Medical Decision Making/ A&P                             Medical Decision Making Risk Prescription drug management.   Patient presents requesting refill of oxycodone.  I discussed we do not typically refill narcotics in the emergency department.  Will give him a dose of his pain medicine here as he did not drive but otherwise he is to call Dr. Henreitta Leber office or PCPs office this morning for discussion of further outpatient pain control.  He is here frequently for similar pain visits.  I do not think he has an emergent surgical  condition or strangulation.  Will discharge home with return precautions.        Final Clinical Impression(s) / ED Diagnoses Final diagnoses:  Left inguinal hernia    Rx / DC Orders ED Discharge Orders     None         Pricilla Loveless, MD 06/01/23 [phone]

## 2023-06-01 NOTE — Discharge Instructions (Signed)
Call Dr. Henreitta Leber and/or your primary care physician for further pain medication refills.  If you develop fever, vomiting, new or worsening pain, color changes over your hernia, or any other new/concerning symptoms then return to the ER or call 911.

## 2023-06-01 NOTE — Telephone Encounter (Signed)
Received call from patient [phone]~ telephone.  Patient requested refill on pain medication. Reports that he went to ER over the weekend and was given some medications, but he is currently out again.   Noted patient was given Oxycodone 5 mg, #8 tabs on 05/29/2023. Noted patient was given Oxycodone 5mg  (1) tab while in ER on 06/01/2023.  Per Dr. Henreitta Leber, no further pain medication will be prescribed until procedure completed.  If pain is severe, he can discuss with PCP prior to upcoming appointment.   Call placed to patient. LMTRC.
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
## 2023-06-01 NOTE — ED Notes (Signed)
Care Plan Guidelines: Regardless of whether or not a patient has a care plan, every ED patient is entitled to a Medical Screening Exam (MSE) per EMTALA to determine if an Emergency Medical Condition [facility]) exists. Care plans are guidelines, and do not constitute a standard of care or substitute for clinical judgment.   Patient Name: Gary Harrington       Care Plan updated: 01/2017           PMH: Stroke              Hypertension           Background:  1. Patient with multiple visits in past 6 months                             2. Frequent ER complaint is dental pain, extremity pain and anxiety        Plan:              1. Upon arrival, patient should be given a medical screening exam (MSE) to determine if an emergency medical condition [facility]) exists.                             2. Please focus on presence of objective findings (example includes dental abscess)                             3. If patient presents with dental pain without complicating features, please refer patient to dentist and consider avoiding narcotics for pain control                                                     4. If patient presents for other non-emergent pain complaints (example includes lack of acute traumatic injury) consider avoiding narcotic medications for pain control                              5. Please avoid benzodiazepine refills unless concern for acute benzodiazepine withdrawal     Care Plan Guidelines: Regardless of whether or not a patient has a care plan, every ED patient is entitled to a Medical Screening Exam (MSE) per EMTALA to determine if an Emergency Medical Condition [facility]) exists. Care plans are guidelines, and do not constitute a standard of care or substitute for clinical judgment.  Patient Name: Gary Harrington     Care Plan updated: 01/2017        PMH: Stroke             Hypertension        Background:  1. Patient with multiple visits in past 6 months                            2. Frequent ER complaint is dental pain, extremity pain and anxiety      Plan:              1. Upon arrival, patient should be given a medical screening exam (MSE) to determine if an emergency medical condition [facility]) exists.  2. Please focus on presence of objective findings (example includes dental abscess)                           3. If patient presents with dental pain without complicating features, please refer patient to dentist and consider avoiding narcotics for pain control                                                   4. If patient presents for other non-emergent pain complaints (example includes lack of acute traumatic injury) consider avoiding narcotic medications for pain control                            5. Please avoid benzodiazepine refills unless concern for acute benzodiazepine withdrawal

## 2023-06-01 NOTE — Telephone Encounter (Signed)
Patient returned call. Again advised of provider recommendations.   Patient also reports that he feels his ribs may be cracked due to Heimlich procedure. Advised to contact PCP to discuss, or to return to ER for CXR.   Verbalized understanding.

## 2023-06-02 ENCOUNTER — Emergency Department (HOSPITAL_COMMUNITY)
Admission: EM | Admit: 2023-06-02 | Discharge: 2023-06-02 | Disposition: A | Discharge status: Home / Self Care | Payer: Medicaid Other | Attending: Emergency Medicine | Admitting: Emergency Medicine

## 2023-06-02 ENCOUNTER — Encounter (HOSPITAL_COMMUNITY): Payer: Self-pay | Admitting: *Deleted

## 2023-06-02 ENCOUNTER — Other Ambulatory Visit: Payer: Self-pay

## 2023-06-02 DIAGNOSIS — R109 Unspecified abdominal pain: Secondary | ICD-10-CM | POA: Diagnosis present

## 2023-06-02 DIAGNOSIS — K4090 Unilateral inguinal hernia, without obstruction or gangrene, not specified as recurrent: Secondary | ICD-10-CM | POA: Insufficient documentation

## 2023-06-02 MED ORDER — OXYCODONE-ACETAMINOPHEN 5-325 MG PO TABS: 1.0000 | ORAL_TABLET | Freq: Four times a day (QID) | ORAL | 0 refills | Status: DC | PRN

## 2023-06-02 NOTE — ED Provider Notes (Signed)
[place] EMERGENCY DEPARTMENT AT [facility] Provider Note   CSN: [id number] Arrival date & time: 06/02/23  1600     History  No chief complaint on file.   Gary Harrington is a 62 y.o. male.  Patient has a chronic left inguinal hernia.  He is followed by Dr. Henreitta Leber and will have surgery soon.  He ran out of his pain medicine  The history is provided by the patient and medical records. No language interpreter was used.  Abdominal Pain Pain location: Left inguinal pain. Pain quality: aching   Pain radiates to:  Does not radiate Pain severity:  Mild Onset quality:  Gradual Timing:  Constant Progression:  Waxing and waning Chronicity:  Recurrent Context: not alcohol use   Relieved by:  Nothing Associated symptoms: no chest pain, no cough, no diarrhea, no fatigue and no hematuria        Home Medications Prior to Admission medications   Medication Sig Start Date End Date Taking? Authorizing Provider  oxyCODONE-acetaminophen (PERCOCET/ROXICET) 5-325 MG tablet Take 1 tablet by mouth every 6 (six) hours as needed for severe pain. 06/02/23  Yes Bethann Berkshire, MD  ALPRAZolam Prudy Feeler) 1 MG tablet Take 1 mg by mouth daily.    [provider]  atorvastatin (LIPITOR) 40 MG tablet Take 1 tablet (40 mg total) by mouth daily. Patient not taking: Reported on 05/07/2023 01/20/22 04/16/23  Carroll Sage, PA-C  benzonatate (TESSALON) 100 MG capsule Take 1 capsule (100 mg total) by mouth 3 (three) times daily as needed for cough. Patient not taking: Reported on 05/07/2023 05/01/23   Lonell Grandchild, MD  clindamycin (CLEOCIN) 150 MG capsule Take 1 capsule (150 mg total) by mouth 3 (three) times daily. 05/08/23   Prosperi, Christian H, PA-C  naproxen (NAPROSYN) 375 MG tablet Take 1 tablet (375 mg total) by mouth 2 (two) times daily. Patient not taking: Reported on 05/07/2023 02/15/23   Kommor, Wyn Forster, MD  oxyCODONE (ROXICODONE) 5 MG immediate release tablet Take 1 tablet  (5 mg total) by mouth every 6 (six) hours as needed for severe pain or breakthrough pain. 05/29/23   Terrilee Files, MD  triamcinolone cream (KENALOG) 0.1 % Apply 1 Application topically 2 (two) times daily. Patient not taking: Reported on 05/07/2023 04/17/23   Eber Hong, MD      Allergies    Codeine, Other, Penicillins, and Prednisone    Review of Systems   Review of Systems  Constitutional:  Negative for appetite change and fatigue.  HENT:  Negative for congestion, ear discharge and sinus pressure.   Eyes:  Negative for discharge.  Respiratory:  Negative for cough.   Cardiovascular:  Negative for chest pain.  Gastrointestinal:  Positive for abdominal pain. Negative for diarrhea.  Genitourinary:  Negative for frequency and hematuria.       Left groin pain  Musculoskeletal:  Negative for back pain.  Skin:  Negative for rash.  Neurological:  Negative for seizures and headaches.  Psychiatric/Behavioral:  Negative for hallucinations.     Physical Exam Updated Vital Signs BP 122/78 (BP Location: Right Arm)   Pulse 92   Temp 97.7 F (36.5 C)   Resp (!) 22   Ht 5\' 9"  (1.753 m)   Wt 93 kg   SpO2 98%   BMI 30.27 kg/m  Physical Exam Vitals and nursing note reviewed.  Constitutional:      Appearance: He is well-developed.  HENT:     Head: Normocephalic.  Nose: Nose normal.  Eyes:     General: No scleral icterus.    Conjunctiva/sclera: Conjunctivae normal.  Neck:     Thyroid: No thyromegaly.  Cardiovascular:     Rate and Rhythm: Normal rate and regular rhythm.     Heart sounds: No murmur heard.    No friction rub. No gallop.  Pulmonary:     Breath sounds: No stridor. No wheezing or rales.  Chest:     Chest wall: No tenderness.  Abdominal:     General: There is no distension.     Tenderness: There is no abdominal tenderness. There is no rebound.  Genitourinary:    Comments: Large left inguinal hernia that is not reducible but has been out for quite some  time Musculoskeletal:        General: Normal range of motion.     Cervical back: Neck supple.  Lymphadenopathy:     Cervical: No cervical adenopathy.  Skin:    Findings: No erythema or rash.  Neurological:     Mental Status: He is alert and oriented to person, place, and time.     Motor: No abnormal muscle tone.     Coordination: Coordination normal.  Psychiatric:        Behavior: Behavior normal.     ED Results / Procedures / Treatments   Labs (all labs ordered are listed, but only abnormal results are displayed) Labs Reviewed - No data to display  EKG None  Radiology No results found.  Procedures Procedures    Medications Ordered in ED Medications - No data to display  ED Course/ Medical Decision Making/ A&P                             Medical Decision Making Risk Prescription drug management.  Patient with pain from chronic left inguinal hernia.  I will write him some more Percocets that he is to follow-up with Dr. Henreitta Leber to have his hernia repaired        Final Clinical Impression(s) / ED Diagnoses Final diagnoses:  Inguinal hernia of left side without obstruction or gangrene    Rx / DC Orders ED Discharge Orders          Ordered    oxyCODONE-acetaminophen (PERCOCET/ROXICET) 5-325 MG tablet  Every 6 hours PRN        06/02/23 1846              Bethann Berkshire, MD 06/06/23 1116

## 2023-06-02 NOTE — ED Triage Notes (Signed)
Pt requesting some pain medication til he follows up with his doctor.  Pt with known hernia and is to F/U [place] on 19th.

## 2023-06-02 NOTE — Discharge Instructions (Signed)
Follow-up with Dr. Henreitta Leber as planned

## 2023-06-09 ENCOUNTER — Emergency Department (HOSPITAL_COMMUNITY)
Admission: EM | Admit: 2023-06-09 | Discharge: 2023-06-09 | Disposition: A | Discharge status: Home / Self Care | Payer: Medicaid Other | Attending: Emergency Medicine | Admitting: Emergency Medicine

## 2023-06-09 ENCOUNTER — Encounter (HOSPITAL_COMMUNITY): Payer: Self-pay | Admitting: Emergency Medicine

## 2023-06-09 ENCOUNTER — Other Ambulatory Visit: Payer: Self-pay

## 2023-06-09 DIAGNOSIS — R52 Pain, unspecified: Secondary | ICD-10-CM

## 2023-06-09 DIAGNOSIS — Z760 Encounter for issue of repeat prescription: Secondary | ICD-10-CM | POA: Insufficient documentation

## 2023-06-09 DIAGNOSIS — K4090 Unilateral inguinal hernia, without obstruction or gangrene, not specified as recurrent: Secondary | ICD-10-CM | POA: Diagnosis not present

## 2023-06-09 DIAGNOSIS — G8929 Other chronic pain: Secondary | ICD-10-CM | POA: Insufficient documentation

## 2023-06-09 MED ORDER — OXYCODONE-ACETAMINOPHEN 5-325 MG PO TABS
2.0000 | ORAL_TABLET | Freq: Once | ORAL | Status: AC
  Administered 2023-06-09: 2 via ORAL
  Filled 2023-06-09: qty 2

## 2023-06-09 MED ORDER — CELECOXIB 200 MG PO CAPS: 200.0000 mg | ORAL_CAPSULE | Freq: Two times a day (BID) | ORAL | 0 refills | Status: DC

## 2023-06-09 NOTE — Discharge Instructions (Signed)
Take short that you go to your follow-up appointment tomorrow morning to make sure you can take care of your hernia and surgery and for further pain control needs.  You may also order or purchase a truss for your inguinal hernia online at [employer] and sometimes at a local pharmacy.  This can help give you some counterpressure in that area.  Get help right away if: You have a fever or chills. You have abdominal pain that gets worse. You feel nauseous or you vomit. You cannot push the hernia back in place by very gently pressing on it while lying down. Do not try to force the bulge back in if it will not go in easily. The hernia: Changes in shape, size, or color. Feels hard or tender.

## 2023-06-09 NOTE — ED Triage Notes (Signed)
Pt requesting refill of pain medication while he awaits surgery for inguinal hernia, sent by surgeon Dr. Algis Greenhouse to request rx.

## 2023-06-09 NOTE — ED Provider Notes (Signed)
[place] EMERGENCY DEPARTMENT AT [facility] Provider Note   CSN: [id number] Arrival date & time: 06/09/23  1528     History  Chief Complaint  Patient presents with   Medication Refill    Gary Harrington is a 62 y.o. male who presents for chronic pain due to known inguinal hernia.  The triage note states that he was sent in by Dr. Henreitta Leber.  I contacted her directly.  He apparently has canceled his last 2 surgeries and has an appointment with him tomorrow.  In shared decision making we will treat his pain here and have him follow-up with her in the morning at his scheduled appointment.  He has no new complaints including severe pain change of color or firmness of the hernia.  Medication Refill      Home Medications Prior to Admission medications   Medication Sig Start Date End Date Taking? Authorizing Provider  ALPRAZolam Prudy Feeler) 1 MG tablet Take 1 mg by mouth daily.    [provider]  atorvastatin (LIPITOR) 40 MG tablet Take 1 tablet (40 mg total) by mouth daily. Patient not taking: Reported on 05/07/2023 01/20/22 04/16/23  Carroll Sage, PA-C  benzonatate (TESSALON) 100 MG capsule Take 1 capsule (100 mg total) by mouth 3 (three) times daily as needed for cough. Patient not taking: Reported on 05/07/2023 05/01/23   Lonell Grandchild, MD  clindamycin (CLEOCIN) 150 MG capsule Take 1 capsule (150 mg total) by mouth 3 (three) times daily. 05/08/23   Prosperi, Christian H, PA-C  naproxen (NAPROSYN) 375 MG tablet Take 1 tablet (375 mg total) by mouth 2 (two) times daily. Patient not taking: Reported on 05/07/2023 02/15/23   Kommor, Wyn Forster, MD  oxyCODONE (ROXICODONE) 5 MG immediate release tablet Take 1 tablet (5 mg total) by mouth every 6 (six) hours as needed for severe pain or breakthrough pain. 05/29/23   Terrilee Files, MD  oxyCODONE-acetaminophen (PERCOCET/ROXICET) 5-325 MG tablet Take 1 tablet by mouth every 6 (six) hours as needed for severe pain. 06/02/23    Bethann Berkshire, MD  triamcinolone cream (KENALOG) 0.1 % Apply 1 Application topically 2 (two) times daily. Patient not taking: Reported on 05/07/2023 04/17/23   Eber Hong, MD      Allergies    Codeine, Other, Penicillins, and Prednisone    Review of Systems   Review of Systems  Physical Exam Updated Vital Signs BP (!) 139/113 (BP Location: Right Arm)   Pulse 96   Temp 98.7 F (37.1 C) (Oral)   Resp 20   Ht 5\' 9"  (1.753 m)   Wt 93 kg   SpO2 98%   BMI 30.27 kg/m  Physical Exam Vitals and nursing note reviewed.  Constitutional:      General: He is not in acute distress.    Appearance: He is well-developed. He is not diaphoretic.  HENT:     Head: Normocephalic and atraumatic.  Eyes:     General: No scleral icterus.    Conjunctiva/sclera: Conjunctivae normal.  Cardiovascular:     Rate and Rhythm: Normal rate and regular rhythm.     Heart sounds: Normal heart sounds.  Pulmonary:     Effort: Pulmonary effort is normal. No respiratory distress.     Breath sounds: Normal breath sounds.  Abdominal:     General: There is no distension.     Palpations: Abdomen is soft.     Tenderness: There is no abdominal tenderness.     Hernia: A hernia is present.  Musculoskeletal:     Cervical back: Normal range of motion and neck supple.  Skin:    General: Skin is warm and dry.  Neurological:     Mental Status: He is alert.  Psychiatric:        Behavior: Behavior normal.     ED Results / Procedures / Treatments   Labs (all labs ordered are listed, but only abnormal results are displayed) Labs Reviewed - No data to display  EKG None  Radiology No results found.  Procedures Procedures    Medications Ordered in ED Medications  oxyCODONE-acetaminophen (PERCOCET/ROXICET) 5-325 MG per tablet 2 tablet (has no administration in time range)    ED Course/ Medical Decision Making/ A&P                             Medical Decision Making Risk Prescription drug  management.   Patient given a single dose of Percocet here.  Will prescribe some anti-inflammatory medications.  He may follow closely with Dr. Henreitta Leber in the morning for further pain control needs.  No concerns for strangulated hernia.       Final Clinical Impression(s) / ED Diagnoses Final diagnoses:  None    Rx / DC Orders ED Discharge Orders     None         Arthor Captain, PA-C 06/09/23 1707    Vanetta Mulders, MD 06/11/23 [phone]

## 2023-06-10 ENCOUNTER — Ambulatory Visit (INDEPENDENT_AMBULATORY_CARE_PROVIDER_SITE_OTHER): Payer: Medicaid Other | Admitting: General Surgery

## 2023-06-10 ENCOUNTER — Encounter: Payer: Self-pay | Admitting: General Surgery

## 2023-06-10 VITALS — BP 121/72 | HR 84 | Temp 98.1°F | Resp 16 | Ht 69.0 in | Wt 203.0 lb

## 2023-06-10 DIAGNOSIS — K4030 Unilateral inguinal hernia, with obstruction, without gangrene, not specified as recurrent: Secondary | ICD-10-CM

## 2023-06-10 MED ORDER — OXYCODONE-ACETAMINOPHEN 5-325 MG PO TABS: 1.0000 | ORAL_TABLET | Freq: Four times a day (QID) | ORAL | 0 refills | Status: DC | PRN

## 2023-06-10 NOTE — H&P (Signed)
[facility] History and Physical   Chief Complaint   Follow-up     Gary Harrington is a 62 y.o. male.  HPI: Gary Harrington is a 62 yo who has an known left incarcerated hernia that does cause him significant pain and has required some narcotic pain medication. He has had to cancel surgery twice once for concern for PNA and once for what I suspect was him being anxious. He says he is nervous about surgery due to a poor experience with this tonsillectomy. He has been to the ED several times for pain control. He has limited resources and has to walk to get places.   He knows he needs to get the hernia repaired and is ready.   Past Medical History:  Diagnosis Date   Anxiety    Chronic pain    COPD (chronic obstructive pulmonary disease) (HCC)    Depression    Dizziness    Headache    Hypertension    Stroke (HCC) 2000    Past Surgical History:  Procedure Laterality Date   TONSILLECTOMY  age 11    Family History  Problem Relation Age of Onset   Stroke Father    Heart failure Father     Social History   Tobacco Use   Smoking status: Every Day    Packs/day: 3.00    Years: 27.00    Additional pack years: 0.00    Total pack years: 81.00    Types: Cigarettes   Smokeless tobacco: Never  Vaping Use   Vaping Use: Never used  Substance Use Topics   Alcohol use: No   Drug use: No    Medications: I have reviewed the patient's current medications. Allergies as of 06/10/2023       Reactions   Codeine Hives, Itching   Other Itching   All Steroids   Penicillins Hives   Has patient had a PCN reaction causing immediate rash, facial/tongue/throat swelling, SOB or lightheadedness with hypotension:yes Has patient had a PCN reaction causing severe rash involving mucus membranes or skin necrosis: No Has patient had a PCN reaction that required hospitalization {Yes/No:[id number] Has patient had a PCN reaction occurring within the last 10 years:noNono If all of the  above answers are "NO", then may proceed with Cephalosporin use.   Prednisone Hives        Medication List        Accurate as of June 10, 2023  1:14 PM. If you have any questions, ask your nurse or doctor.          STOP taking these medications    atorvastatin 40 MG tablet Commonly known as: LIPITOR Stopped by: Supreme Rybarczyk C Danilynn Jemison, MD   benzonatate 100 MG capsule Commonly known as: TESSALON Stopped by: Evan Mackie C Dontarius Sheley, MD   celecoxib 200 MG capsule Commonly known as: CeleBREX Stopped by: Tonji Elliff C Audry Kauzlarich, MD   clindamycin 150 MG capsule Commonly known as: CLEOCIN Stopped by: Ronnetta Currington C Rendell Thivierge, MD   naproxen 375 MG tablet Commonly known as: NAPROSYN Stopped by: Delmy Holdren C Suhaan Perleberg, MD   oxyCODONE 5 MG immediate release tablet Commonly known as: Roxicodone Stopped by: Americus Perkey C Rozena Fierro, MD   triamcinolone cream 0.1 % Commonly known as: KENALOG Stopped by: Verley Pariseau C Tyisha Cressy, MD       TAKE these medications    ALPRAZolam 1 MG tablet Commonly known as: XANAX Take 1 mg by mouth daily.   oxyCODONE-acetaminophen 5-325 MG tablet Commonly known as: PERCOCET/ROXICET Take 1 tablet by   mouth every 6 (six) hours as needed for severe pain.         ROS:  A comprehensive review of systems was negative except for: Gastrointestinal: positive for abdominal pain and left groin pain  Blood pressure 121/72, pulse 84, temperature 98.1 F (36.7 C), temperature source Oral, resp. rate 16, height 5' 9" (1.753 m), weight 203 lb (92.1 kg), SpO2 96 %. Physical Exam Vitals reviewed.  HENT:     Head: Normocephalic.     Nose: Nose normal.  Eyes:     Extraocular Movements: Extraocular movements intact.  Cardiovascular:     Rate and Rhythm: Normal rate.  Pulmonary:     Effort: Pulmonary effort is normal.  Abdominal:     General: There is no distension.     Palpations: Abdomen is soft.     Tenderness: There is abdominal tenderness.     Hernia: A hernia is present. Hernia  is present in the left inguinal area.  Musculoskeletal:        General: Normal range of motion.  Skin:    General: Skin is warm.  Neurological:     General: No focal deficit present.     Mental Status: He is alert and oriented to person, place, and time.  Psychiatric:        Mood and Affect: Mood normal.        Behavior: Behavior normal.        Thought Content: Thought content normal.     Results: CT reviewed 03/2023- sigmoid colon in the hernia, large mouth of hernia over 4.5 cm in size   Assessment & Plan:  Gary Harrington is a 62 y.o. male with a large incarcerated inguinal hernia on the left with sigmoid colon. He is in pain and I do believe this and he is suffering but he is also nervous. I explained he has to come to the OR Monday to get this repaired.   -LAST REFILL For Percocet 5-325 q 6 PRN # 10 given (sent to [facility] initially but called and canceled, resent to [facility])  Discussed the risk and benefits including, bleeding, infection, use of mesh, risk of recurrence, risk of nerve damage causing numbness or changes in sensation, risk of damage to the cord structures. The patient understands the risk and benefits of repair with mesh, and has decided to proceed.  We also discussed open versus robotic assisted laparoscopic surgery and the use of mesh. We discussed that I do both robotic and open repairs with mesh, and that these are considered equivalent. We discussed reasons for opting for laparoscopic surgery including if a bilateral repair is needed or if a patient has a recurrence after an open repair. We discussed the option of watch and wait in men and discussed that in 5 years some studies report that 40% of men have crossed over to needing a hernia repair because the hernia has become larger or symptomatic. We discussed that women are not appropriate candidate for watchful waiting due to the risk of femoral hernias.   Plan for robotic left inguinal hernia repair.    All  questions were answered to the satisfaction of the patient.    Jessaca Philippi C Noemy Hallmon 06/10/2023, 1:14 PM       

## 2023-06-10 NOTE — Patient Instructions (Signed)
Left inguinal hernia surgery scheduled for 06/15/23.

## 2023-06-10 NOTE — Progress Notes (Signed)
[facility] History and Physical   Chief Complaint   Follow-up     Gary Harrington is a 62 y.o. male.  HPI: Gary Harrington is a 62 yo who has an known left incarcerated hernia that does cause him significant pain and has required some narcotic pain medication. He has had to cancel surgery twice once for concern for PNA and once for what I suspect was him being anxious. He says he is nervous about surgery due to a poor experience with this tonsillectomy. He has been to the ED several times for pain control. He has limited resources and has to walk to get places.   He knows he needs to get the hernia repaired and is ready.   Past Medical History:  Diagnosis Date   Anxiety    Chronic pain    COPD (chronic obstructive pulmonary disease) (HCC)    Depression    Dizziness    Headache    Hypertension    Stroke (HCC) 2000    Past Surgical History:  Procedure Laterality Date   TONSILLECTOMY  age 87    Family History  Problem Relation Age of Onset   Stroke Father    Heart failure Father     Social History   Tobacco Use   Smoking status: Every Day    Packs/day: 3.00    Years: 27.00    Additional pack years: 0.00    Total pack years: 81.00    Types: Cigarettes   Smokeless tobacco: Never  Vaping Use   Vaping Use: Never used  Substance Use Topics   Alcohol use: No   Drug use: No    Medications: I have reviewed the patient's current medications. Allergies as of 06/10/2023       Reactions   Codeine Hives, Itching   Other Itching   All Steroids   Penicillins Hives   Has patient had a PCN reaction causing immediate rash, facial/tongue/throat swelling, SOB or lightheadedness with hypotension:yes Has patient had a PCN reaction causing severe rash involving mucus membranes or skin necrosis: No Has patient had a PCN reaction that required hospitalization {Yes/No:[id number] Has patient had a PCN reaction occurring within the last 10 years:noNono If all of the  above answers are "NO", then may proceed with Cephalosporin use.   Prednisone Hives        Medication List        Accurate as of June 10, 2023  1:14 PM. If you have any questions, ask your nurse or doctor.          STOP taking these medications    atorvastatin 40 MG tablet Commonly known as: LIPITOR Stopped by: Lucretia Roers, MD   benzonatate 100 MG capsule Commonly known as: TESSALON Stopped by: Lucretia Roers, MD   celecoxib 200 MG capsule Commonly known as: CeleBREX Stopped by: Lucretia Roers, MD   clindamycin 150 MG capsule Commonly known as: CLEOCIN Stopped by: Lucretia Roers, MD   naproxen 375 MG tablet Commonly known as: NAPROSYN Stopped by: Lucretia Roers, MD   oxyCODONE 5 MG immediate release tablet Commonly known as: Roxicodone Stopped by: Lucretia Roers, MD   triamcinolone cream 0.1 % Commonly known as: KENALOG Stopped by: Lucretia Roers, MD       TAKE these medications    ALPRAZolam 1 MG tablet Commonly known as: XANAX Take 1 mg by mouth daily.   oxyCODONE-acetaminophen 5-325 MG tablet Commonly known as: PERCOCET/ROXICET Take 1 tablet by  mouth every 6 (six) hours as needed for severe pain.         ROS:  A comprehensive review of systems was negative except for: Gastrointestinal: positive for abdominal pain and left groin pain  Blood pressure 121/72, pulse 84, temperature 98.1 F (36.7 C), temperature source Oral, resp. rate 16, height 5\' 9"  (1.753 m), weight 203 lb (92.1 kg), SpO2 96 %. Physical Exam Vitals reviewed.  HENT:     Head: Normocephalic.     Nose: Nose normal.  Eyes:     Extraocular Movements: Extraocular movements intact.  Cardiovascular:     Rate and Rhythm: Normal rate.  Pulmonary:     Effort: Pulmonary effort is normal.  Abdominal:     General: There is no distension.     Palpations: Abdomen is soft.     Tenderness: There is abdominal tenderness.     Hernia: A hernia is present. Hernia  is present in the left inguinal area.  Musculoskeletal:        General: Normal range of motion.  Skin:    General: Skin is warm.  Neurological:     General: No focal deficit present.     Mental Status: He is alert and oriented to person, place, and time.  Psychiatric:        Mood and Affect: Mood normal.        Behavior: Behavior normal.        Thought Content: Thought content normal.     Results: CT reviewed 03/2023- sigmoid colon in the hernia, large mouth of hernia over 4.5 cm in size   Assessment & Plan:  Gary Harrington is a 62 y.o. male with a large incarcerated inguinal hernia on the left with sigmoid colon. He is in pain and I do believe this and he is suffering but he is also nervous. I explained he has to come to the OR Monday to get this repaired.   -LAST REFILL For Percocet 5-325 q 6 PRN # 10 given (sent [facility] but called and canceled, resent to [facility])  Discussed the risk and benefits including, bleeding, infection, use of mesh, risk of recurrence, risk of nerve damage causing numbness or changes in sensation, risk of damage to the cord structures. The patient understands the risk and benefits of repair with mesh, and has decided to proceed.  We also discussed open versus robotic assisted laparoscopic surgery and the use of mesh. We discussed that I do both robotic and open repairs with mesh, and that these are considered equivalent. We discussed reasons for opting for laparoscopic surgery including if a bilateral repair is needed or if a patient has a recurrence after an open repair. We discussed the option of watch and wait in men and discussed that in 5 years some studies report that 40% of men have crossed over to needing a hernia repair because the hernia has become larger or symptomatic. We discussed that women are not appropriate candidate for watchful waiting due to the risk of femoral hernias.   Plan for robotic left inguinal hernia repair.    All  questions were answered to the satisfaction of the patient.    Lucretia Roers 06/10/2023, 1:14 PM

## 2023-06-11 ENCOUNTER — Encounter (HOSPITAL_COMMUNITY)
Admit: 2023-06-11 | Discharge: 2023-06-11 | Disposition: A | Discharge status: Home / Self Care | Payer: Medicaid Other | Attending: General Surgery | Admitting: General Surgery

## 2023-06-11 ENCOUNTER — Other Ambulatory Visit: Payer: Self-pay

## 2023-06-11 ENCOUNTER — Encounter (HOSPITAL_COMMUNITY): Payer: Self-pay

## 2023-06-11 ENCOUNTER — Emergency Department (HOSPITAL_COMMUNITY)
Admission: EM | Admit: 2023-06-11 | Discharge: 2023-06-11 | Disposition: A | Discharge status: Home / Self Care | Payer: Medicaid Other | Attending: Emergency Medicine | Admitting: Emergency Medicine

## 2023-06-11 DIAGNOSIS — J449 Chronic obstructive pulmonary disease, unspecified: Secondary | ICD-10-CM | POA: Diagnosis not present

## 2023-06-11 DIAGNOSIS — K0889 Other specified disorders of teeth and supporting structures: Secondary | ICD-10-CM | POA: Insufficient documentation

## 2023-06-11 DIAGNOSIS — I10 Essential (primary) hypertension: Secondary | ICD-10-CM | POA: Insufficient documentation

## 2023-06-11 HISTORY — DX: Dyspnea, unspecified: R06.00

## 2023-06-11 MED ORDER — CLINDAMYCIN HCL 300 MG PO CAPS: 300.0000 mg | ORAL_CAPSULE | Freq: Four times a day (QID) | ORAL | 0 refills | Status: DC

## 2023-06-11 MED ORDER — CLINDAMYCIN HCL 150 MG PO CAPS
300.0000 mg | ORAL_CAPSULE | Freq: Once | ORAL | Status: AC
  Administered 2023-06-11: 300 mg via ORAL
  Filled 2023-06-11: qty 2

## 2023-06-11 NOTE — ED Provider Notes (Signed)
[place] EMERGENCY DEPARTMENT AT [facility] Provider Note   CSN: [id number] Arrival date & time: 06/11/23  1846     History  Chief Complaint  Patient presents with   Dental Pain    Gary Harrington is a 62 y.o. male.  Patient has history of hypertension and COPD.  He complains of painful tooth.  The history is provided by the patient and medical records. No language interpreter was used.  Dental Pain Location:  Upper Quality:  Aching and dull Severity:  Moderate Onset quality:  Sudden Timing:  Constant Progression:  Worsening Chronicity:  Recurrent Context: abscess   Relieved by:  Nothing Worsened by:  Nothing Associated symptoms: no congestion and no headaches        Home Medications Prior to Admission medications   Medication Sig Start Date End Date Taking? Authorizing Provider  clindamycin (CLEOCIN) 300 MG capsule Take 1 capsule (300 mg total) by mouth 4 (four) times daily. X 7 days 06/11/23  Yes Bethann Berkshire, MD  albuterol (VENTOLIN HFA) 108 (90 Base) MCG/ACT inhaler Inhale 2 puffs into the lungs every 6 (six) hours as needed for wheezing or shortness of breath.    [provider]  ALPRAZolam Prudy Feeler) 1 MG tablet Take 1 mg by mouth daily.    [provider]  oxyCODONE-acetaminophen (PERCOCET/ROXICET) 5-325 MG tablet Take 1 tablet by mouth every 6 (six) hours as needed for severe pain. 06/10/23   Lucretia Roers, MD      Allergies    Codeine, Other, Penicillins, and Prednisone    Review of Systems   Review of Systems  Constitutional:  Negative for appetite change and fatigue.  HENT:  Negative for congestion, ear discharge and sinus pressure.        Tooth ache  Eyes:  Negative for discharge.  Respiratory:  Negative for cough.   Cardiovascular:  Negative for chest pain.  Gastrointestinal:  Negative for abdominal pain and diarrhea.  Genitourinary:  Negative for frequency and hematuria.  Musculoskeletal:  Negative for back  pain.  Skin:  Negative for rash.  Neurological:  Negative for seizures and headaches.  Psychiatric/Behavioral:  Negative for hallucinations.     Physical Exam Updated Vital Signs BP 106/80   Pulse 77   Temp 98.1 F (36.7 C)   Resp 20   Ht 5\' 9"  (1.753 m)   Wt 92.1 kg   SpO2 93%   BMI 29.98 kg/m  Physical Exam Vitals and nursing note reviewed.  Constitutional:      Appearance: He is well-developed.  HENT:     Head: Normocephalic.     Nose: Nose normal.     Mouth/Throat:     Comments: Tender left upper gum Eyes:     General: No scleral icterus.    Conjunctiva/sclera: Conjunctivae normal.  Neck:     Thyroid: No thyromegaly.  Cardiovascular:     Rate and Rhythm: Normal rate and regular rhythm.     Heart sounds: No murmur heard.    No friction rub. No gallop.  Pulmonary:     Breath sounds: No stridor. No wheezing or rales.  Chest:     Chest wall: No tenderness.  Abdominal:     General: There is no distension.     Tenderness: There is no abdominal tenderness. There is no rebound.  Musculoskeletal:        General: Normal range of motion.     Cervical back: Neck supple.  Lymphadenopathy:     Cervical:  No cervical adenopathy.  Skin:    Findings: No erythema or rash.  Neurological:     Mental Status: He is alert and oriented to person, place, and time.     Motor: No abnormal muscle tone.     Coordination: Coordination normal.  Psychiatric:        Behavior: Behavior normal.     ED Results / Procedures / Treatments   Labs (all labs ordered are listed, but only abnormal results are displayed) Labs Reviewed - No data to display  EKG None  Radiology No results found.  Procedures Procedures    Medications Ordered in ED Medications  clindamycin (CLEOCIN) capsule 300 mg (has no administration in time range)    ED Course/ Medical Decision Making/ A&P                             Medical Decision Making Risk Prescription drug management.   Patient  with dental caries.  He is placed on clindamycin and will follow-up with a dentist        Final Clinical Impression(s) / ED Diagnoses Final diagnoses:  Pain, dental    Rx / DC Orders ED Discharge Orders          Ordered    clindamycin (CLEOCIN) 300 MG capsule  4 times daily        06/11/23 1919              Bethann Berkshire, MD 06/12/23 1205

## 2023-06-11 NOTE — ED Notes (Signed)
Introduced self to pt and attached to partial monitor Pt stated he wants ABX for tooth Noted only gums to the upper LEFT side of mouth Hole in gums (Pt stated the hole has been there for a while) Pt stated that he had some fresh honey tonight and a piece of honeycomb got stuck in his hole Pt stated he got the honey comb out Just wants ABX Pt stated several times that he does not want pain medication. Pt stated he is having surgery Monday for hernia and does not want pain meds

## 2023-06-11 NOTE — ED Triage Notes (Signed)
Pt reports he got a piece of honey comb stuck in his left upper tooth and has a lot of pain.

## 2023-06-11 NOTE — Discharge Instructions (Signed)
Follow up with a dentist next week. °

## 2023-06-12 ENCOUNTER — Other Ambulatory Visit: Payer: Self-pay

## 2023-06-12 ENCOUNTER — Emergency Department (HOSPITAL_COMMUNITY)
Admission: EM | Admit: 2023-06-12 | Discharge: 2023-06-12 | Disposition: A | Discharge status: Home / Self Care | Payer: Medicaid Other | Attending: Emergency Medicine | Admitting: Emergency Medicine

## 2023-06-12 ENCOUNTER — Encounter (HOSPITAL_COMMUNITY): Payer: Self-pay | Admitting: Emergency Medicine

## 2023-06-12 DIAGNOSIS — K4090 Unilateral inguinal hernia, without obstruction or gangrene, not specified as recurrent: Secondary | ICD-10-CM | POA: Diagnosis not present

## 2023-06-12 DIAGNOSIS — R109 Unspecified abdominal pain: Secondary | ICD-10-CM | POA: Diagnosis present

## 2023-06-12 MED ORDER — OXYCODONE-ACETAMINOPHEN 5-325 MG PO TABS: 1.0000 | ORAL_TABLET | Freq: Four times a day (QID) | ORAL | 0 refills | Status: DC | PRN

## 2023-06-12 MED ORDER — OXYCODONE-ACETAMINOPHEN 5-325 MG PO TABS
1.0000 | ORAL_TABLET | Freq: Once | ORAL | Status: AC
  Administered 2023-06-12: 1 via ORAL
  Filled 2023-06-12: qty 1

## 2023-06-12 NOTE — Discharge Instructions (Signed)
Noted today for pain in your left groin due to your inguinal hernia.  Have this repaired on Monday as scheduled.  You are given medication to get you through until Monday.  Come back to the ER if you have any new or worsening symptoms such as vomiting, severe pain, fever or other worrisome changes.

## 2023-06-12 NOTE — ED Provider Notes (Signed)
[place] EMERGENCY DEPARTMENT AT [facility] Provider Note   CSN: [id number] Arrival date & time: 06/12/23  1605     History  Chief Complaint  Patient presents with   Medication Refill    Gary Harrington is a 62 y.o. male.  Has history of a left inguinal hernia, causes a lot of pain.  He is scheduled to have this repaired on Monday with Dr. Henreitta Leber.  He states he recently lost his car and is now having to walk everywhere and has been having increased pain.  Dr. Henreitta Leber prescribed him Percocet and he took his last one today, requesting to get enough to get him through till surgery on Monday, denies any increased pain, vomiting, fevers, trouble urinating or other associated symptoms.  States that hernia does get larger when he has to walk a lot but gets better when he is able to rest   Medication Refill      Home Medications Prior to Admission medications   Medication Sig Start Date End Date Taking? Authorizing Provider  oxyCODONE-acetaminophen (PERCOCET/ROXICET) 5-325 MG tablet Take 1 tablet by mouth every 6 (six) hours as needed for severe pain. 06/12/23  Yes Teddy Pena A, PA-C  albuterol (VENTOLIN HFA) 108 (90 Base) MCG/ACT inhaler Inhale 2 puffs into the lungs every 6 (six) hours as needed for wheezing or shortness of breath.    [provider]  ALPRAZolam Prudy Feeler) 1 MG tablet Take 1 mg by mouth daily.    [provider]  clindamycin (CLEOCIN) 300 MG capsule Take 1 capsule (300 mg total) by mouth 4 (four) times daily. X 7 days 06/11/23   Bethann Berkshire, MD      Allergies    Codeine, Other, Penicillins, and Prednisone    Review of Systems   Review of Systems  Physical Exam Updated Vital Signs BP 110/66 (BP Location: Right Arm)   Pulse 75   Temp 98.4 F (36.9 C) (Oral)   Resp 18   Ht 5\' 9"  (1.753 m)   Wt 92.1 kg   SpO2 95%   BMI 29.98 kg/m  Physical Exam Vitals and nursing note reviewed. Exam conducted with a chaperone present.   Constitutional:      General: He is not in acute distress.    Appearance: He is well-developed.  HENT:     Head: Normocephalic and atraumatic.  Eyes:     Conjunctiva/sclera: Conjunctivae normal.  Cardiovascular:     Rate and Rhythm: Normal rate and regular rhythm.     Heart sounds: No murmur heard. Pulmonary:     Effort: Pulmonary effort is normal. No respiratory distress.     Breath sounds: Normal breath sounds.  Abdominal:     Palpations: Abdomen is soft.     Tenderness: There is no abdominal tenderness.  Genitourinary:    Comments: Hernia contained within left scrotal home, able to reduce, mild tenderness noted.  No induration or crepitus. Musculoskeletal:        General: No swelling.     Cervical back: Neck supple.  Skin:    General: Skin is warm and dry.     Capillary Refill: Capillary refill takes less than 2 seconds.  Neurological:     General: No focal deficit present.     Mental Status: He is alert and oriented to person, place, and time.  Psychiatric:        Mood and Affect: Mood normal.     ED Results / Procedures / Treatments   Labs (  all labs ordered are listed, but only abnormal results are displayed) Labs Reviewed - No data to display  EKG None  Radiology No results found.  Procedures Procedures    Medications Ordered in ED Medications  oxyCODONE-acetaminophen (PERCOCET/ROXICET) 5-325 MG per tablet 1 tablet (1 tablet Oral Given 06/12/23 1928)    ED Course/ Medical Decision Making/ A&P                             Medical Decision Making Ddx: Inguinal hernia, incarcerated hernia, obstruction, triangulated hernia, other ED course: Patient presents the ER complaining of pain related to his inguinal hernia that he is having repaired on Monday, he has pain secondary to his need to walk everywhere, has no signs of strangulation, given pain medication to last him until Monday.  Follow-up with strict return precautions.  Amount and/or Complexity of  Data Reviewed External Data Reviewed: notes.    Details: This note from Dr. Henreitta Leber  Risk Prescription drug management.           Final Clinical Impression(s) / ED Diagnoses Final diagnoses:  Unilateral inguinal hernia without obstruction or gangrene, recurrence not specified    Rx / DC Orders ED Discharge Orders          Ordered    oxyCODONE-acetaminophen (PERCOCET/ROXICET) 5-325 MG tablet  Every 6 hours PRN        06/12/23 1928              Josem Kaufmann 06/12/23 1936    Lonell Grandchild, MD 06/13/23 1505

## 2023-06-12 NOTE — ED Triage Notes (Signed)
Pt requesting medication refill for pain medication. He is scheduled for left inguinal hernia repair surgery on Monday morning at 0830am and ran out of pain medication earlier today. Pain rated 10/10.

## 2023-06-15 ENCOUNTER — Encounter (HOSPITAL_COMMUNITY): Payer: Self-pay | Admitting: Certified Registered"

## 2023-06-15 ENCOUNTER — Telehealth: Payer: Self-pay | Admitting: *Deleted

## 2023-06-15 DIAGNOSIS — K4030 Unilateral inguinal hernia, with obstruction, without gangrene, not specified as recurrent: Secondary | ICD-10-CM | POA: Diagnosis present

## 2023-06-15 NOTE — Telephone Encounter (Signed)
Received multiple VM from patient and patient son after office hours on 06/12/2023.   Reports that patient would be out of Oxycodone/APAP on Sunday, 06/14/2023 that Dr. Henreitta Leber prescribed on Wednesday, 06/10/2023. Requested additional pain medication to last until surgery, 06/15/2023.  Per Dr. Henreitta Leber, -LAST REFILL For Percocet 5-325 q 6 PRN # 10 given on 06/10/2023.  Patient noted to have been seen in ER for dental pain on 06/11/2023 and for inguinal hernia pain on 06/12/2023.   Patient prescribed clindamycin for dental caries and Oxycodone/APAP 3/25 mg, # 15 tabs for hernia pain.

## 2023-06-15 NOTE — Progress Notes (Signed)
[facility]  Patient is a no show again for his surgery. The first cancellation was for him being sick. The second he was a no show and the third he was a no show without any calls or information. I have told him I am not giving him any more pain medication.   Patient has an incarcerated hernia and is at risk of strangulation. We have rescheduled and rearranged OR for him multiple times.    He should not receive any more pain medication from my office as he has not come to get the hernia fixed which will help with his discomfort.   Algis Greenhouse, MD [facility] [address] [place] [place], [place] [address] [phone] (office)

## 2023-06-15 NOTE — Telephone Encounter (Signed)
Noted  

## 2023-06-15 NOTE — Telephone Encounter (Signed)
No showed to OR today. I wrote a note about it. We cannot give him any more pain medication.

## 2023-06-15 NOTE — Telephone Encounter (Signed)
Received call from patient son, Molly Maduro [phone] telephone.   Reports that he and patient were walking to hospital this morning for scheduled surgery when patient experienced sharp stabbing pains in his abdomen. Patient reports that he has been having similar pains since the last ER visit on 06/12/2023 where the hernia was reduced.   Patient was unable to continue the walk to the hospital.   As discussed with Dr. Henreitta Leber, patient rescheduled for 07/13/2023. Pre-Op phone call scheduled for 07/09/2023.   Call placed to patient and patient son made aware.

## 2023-06-17 ENCOUNTER — Other Ambulatory Visit: Payer: Self-pay

## 2023-06-17 ENCOUNTER — Emergency Department (HOSPITAL_COMMUNITY)
Admission: EM | Admit: 2023-06-17 | Discharge: 2023-06-17 | Disposition: A | Discharge status: Home / Self Care | Payer: Medicaid Other | Attending: Emergency Medicine | Admitting: Emergency Medicine

## 2023-06-17 ENCOUNTER — Encounter (HOSPITAL_COMMUNITY): Payer: Self-pay

## 2023-06-17 DIAGNOSIS — K4090 Unilateral inguinal hernia, without obstruction or gangrene, not specified as recurrent: Secondary | ICD-10-CM | POA: Insufficient documentation

## 2023-06-17 NOTE — ED Triage Notes (Signed)
Patient reports inguinal hernia pain on the L side. Reports he was supposed to have surgery 2 days ago and couldn't make it to the hospital. Upon arrival to ER, patient is alert and oriented, ambu. Pain is 10/10

## 2023-06-17 NOTE — ED Provider Notes (Signed)
[place] EMERGENCY DEPARTMENT AT [facility]  Provider Note  CSN: [id number] Arrival date & time: 06/17/23 4098  History Chief Complaint  Patient presents with   Inguinal Hernia    Gary Harrington is a 62 y.o. male with known inguinal hernia, frequent ED visits for pain control, has been scheduled for repair multiple times but no-shows for the surgery including 6/24 when he reports the pain from the hernia became  too severe  while he was walking to the hospital so he called his son to bring him home. Unclear why he didn't ask his son to bring him to the hospital instead. He has had 108 total tablets of oxycodone prescribed in the last month by various providers.    Home Medications Prior to Admission medications   Medication Sig Start Date End Date Taking? Authorizing Provider  albuterol (VENTOLIN HFA) 108 (90 Base) MCG/ACT inhaler Inhale 2 puffs into the lungs every 6 (six) hours as needed for wheezing or shortness of breath.    [provider]  ALPRAZolam Prudy Feeler) 1 MG tablet Take 1 mg by mouth daily.    [provider]  clindamycin (CLEOCIN) 300 MG capsule Take 1 capsule (300 mg total) by mouth 4 (four) times daily. X 7 days 06/11/23   Bethann Berkshire, MD  oxyCODONE-acetaminophen (PERCOCET/ROXICET) 5-325 MG tablet Take 1 tablet by mouth every 6 (six) hours as needed for severe pain. 06/12/23   Carmel Sacramento A, PA-C     Allergies    Codeine, Other, Penicillins, and Prednisone   Review of Systems   Review of Systems Please see HPI for pertinent positives and negatives  Physical Exam BP (!) 124/91 (BP Location: Left Arm)   Pulse 77   Temp 97.6 F (36.4 C) (Oral)   Resp 18   Ht 5\' 9"  (1.753 m)   Wt 92.1 kg   SpO2 96%   BMI 29.98 kg/m   Physical Exam Vitals and nursing note reviewed.  HENT:     Head: Normocephalic.     Nose: Nose normal.  Eyes:     Extraocular Movements: Extraocular movements intact.  Pulmonary:     Effort: Pulmonary  effort is normal.  Genitourinary:    Comments: Large R inguinal hernia into his scrotum without signs of incarceration or strangulation.  Musculoskeletal:        General: Normal range of motion.     Cervical back: Neck supple.  Skin:    Findings: No rash (on exposed skin).  Neurological:     Mental Status: He is alert and oriented to person, place, and time.  Psychiatric:        Mood and Affect: Mood normal.     ED Results / Procedures / Treatments   EKG None  Procedures Procedures  Medications Ordered in the ED Medications - No data to display  Initial Impression and Plan  Patient advised he would not be given any additional pain medications from the ED. Encouraged to have his surgery as scheduled.   ED Course       MDM Rules/Calculators/A&P Medical Decision Making Problems Addressed: Unilateral inguinal hernia without obstruction or gangrene, recurrence not specified: chronic illness or injury     Final Clinical Impression(s) / ED Diagnoses Final diagnoses:  Unilateral inguinal hernia without obstruction or gangrene, recurrence not specified    Rx / DC Orders ED Discharge Orders     None        Pollyann Savoy, MD 06/17/23 [id number]

## 2023-06-17 NOTE — ED Notes (Signed)
Patient not in room at time of discharge

## 2023-06-17 NOTE — ED Notes (Signed)
Pt never returned for dc instructions or papers. DC papers are at the desk if patient returns.

## 2023-06-22 ENCOUNTER — Emergency Department (HOSPITAL_COMMUNITY)
Admission: EM | Admit: 2023-06-22 | Discharge: 2023-06-22 | Discharge status: Against Medical Advice | Payer: Medicaid Other

## 2023-06-23 ENCOUNTER — Telehealth: Payer: Self-pay | Admitting: *Deleted

## 2023-06-23 NOTE — Telephone Encounter (Signed)
Surgical Date: 07/13/2023 Procedure: XI ROBOTIC ASSISTED INGUINAL HERNIA REPAIR WITH MESH, LEFT CPT: 40981 Dx: K40.30  Received call from patient [phone]~ telephone.   Patient reports that he is in extreme pain and has been out of pain medications x2 weeks. States that he was seen at PCP who declined to prescribe pain medication. Patient was noted to have been seen in ER on 06/17/2023 for pain and again on 06/22/2023 when patient left without being seen.   Again advised that patient would not receive any further prescriptions until the surgery has been completed per Dr. Henreitta Leber.   Verbalized understanding.

## 2023-06-24 ENCOUNTER — Other Ambulatory Visit: Payer: Self-pay

## 2023-06-24 ENCOUNTER — Encounter (HOSPITAL_COMMUNITY): Payer: Self-pay | Admitting: Emergency Medicine

## 2023-06-24 ENCOUNTER — Emergency Department (HOSPITAL_COMMUNITY)
Admission: EM | Admit: 2023-06-24 | Discharge: 2023-06-24 | Discharge status: Against Medical Advice | Payer: Medicaid Other | Attending: Emergency Medicine | Admitting: Emergency Medicine

## 2023-06-24 DIAGNOSIS — Z5321 Procedure and treatment not carried out due to patient leaving prior to being seen by health care provider: Secondary | ICD-10-CM | POA: Diagnosis not present

## 2023-06-24 DIAGNOSIS — K4090 Unilateral inguinal hernia, without obstruction or gangrene, not specified as recurrent: Secondary | ICD-10-CM | POA: Diagnosis present

## 2023-06-24 NOTE — ED Triage Notes (Signed)
Pt via POV reporting continued inguinal hernia pain. He was scheduled for hernia repair several times last month but the procedure keeps getting rescheduled, and he says that his last procedure was postponed due to dental infection.  

## 2023-06-29 ENCOUNTER — Telehealth: Payer: Self-pay | Admitting: *Deleted

## 2023-06-29 NOTE — Telephone Encounter (Signed)
Received call from patient [phone]~ telephone.   Patient reports that pain has worsened and he feels that hernia has grown larger.  Patient states that he feels he is not going to make it to scheduled surgery on 07/13/2023.   Patient states that he will try to "last" until surgery date, but he doesn't think he is going to "make it".  Advised that if pain is that severe and he is unable to tolerate hernia until scheduled surgery date to go to ER and wait to be seen.   Patient requested that Dr. Henreitta Leber be made aware of growing hernia and increased pain. Advised that all calls and messages are routed to Dr. Henreitta Leber for review.

## 2023-06-29 NOTE — Telephone Encounter (Signed)
Surgical Date: 07/13/2023 Procedure: XI ROBOTIC ASSISTED INGUINAL HERNIA REPAIR WITH MESH, LEFT CPT: 49650     Dx: K40.30   Received VM from patient [phone]~ telephone.    Patient reports that he is in extreme pain and has been out of pain medications x3 weeks.   Patient was noted to have been seen in ER on 06/24/2023 when patient left without being seen.    Call placed to patient and discussed with patient son, Gary Maduro. Again advised that patient would not receive any further prescriptions until the surgery has been completed per Dr. Henreitta Leber.    Verbalized understanding.

## 2023-07-02 ENCOUNTER — Other Ambulatory Visit: Payer: Self-pay

## 2023-07-02 ENCOUNTER — Encounter (HOSPITAL_COMMUNITY): Payer: Self-pay

## 2023-07-02 ENCOUNTER — Emergency Department (HOSPITAL_COMMUNITY)
Admission: EM | Admit: 2023-07-02 | Discharge: 2023-07-02 | Disposition: A | Discharge status: Home / Self Care | Payer: Medicaid Other | Attending: Student | Admitting: Student

## 2023-07-02 DIAGNOSIS — K4090 Unilateral inguinal hernia, without obstruction or gangrene, not specified as recurrent: Secondary | ICD-10-CM | POA: Diagnosis present

## 2023-07-02 DIAGNOSIS — I10 Essential (primary) hypertension: Secondary | ICD-10-CM | POA: Diagnosis not present

## 2023-07-02 MED ORDER — OXYCODONE-ACETAMINOPHEN 5-325 MG PO TABS: 1.0000 | ORAL_TABLET | Freq: Three times a day (TID) | ORAL | 0 refills | Status: DC | PRN

## 2023-07-02 MED ORDER — OXYCODONE-ACETAMINOPHEN 5-325 MG PO TABS
1.0000 | ORAL_TABLET | Freq: Once | ORAL | Status: AC
  Administered 2023-07-02: 1 via ORAL
  Filled 2023-07-02: qty 1

## 2023-07-02 NOTE — Discharge Instructions (Addendum)
You have been seen today for your complaint of hernia pain. Your discharge medications include Percocet. This is an opioid pain medication. You should only take this medication as needed for severe pain. You should not drive, operate heavy machinery or make important decisions while taking this medication. You should use alternative methods for pain relief while taking this medication including stretching, gentle range of motion, and alternating tylenol and ibuprofen. Follow up with: Your surgeon as scheduled Please seek immediate medical care if you develop any of the following symptoms: You have a fever or chills. You have belly pain that gets worse. You feel like you may vomit, or you vomit. Your hernia cannot be pushed in by gently pressing on it when you are lying down. Your hernia: Changes in shape or size. Changes color. Feels hard, or it hurts when you touch it. At this time there does not appear to be the presence of an emergent medical condition, however there is always the potential for conditions to change. Please read and follow the below instructions.  Do not take your medicine if  develop an itchy rash, swelling in your mouth or lips, or difficulty breathing; call 911 and seek immediate emergency medical attention if this occurs.  You may review your lab tests and imaging results in their entirety on your MyChart account.  Please discuss all results of fully with your primary care provider and other specialist at your follow-up visit.  Note: Portions of this text may have been transcribed using voice recognition software. Every effort was made to ensure accuracy; however, inadvertent computerized transcription errors may still be present.

## 2023-07-02 NOTE — ED Triage Notes (Signed)
Pt complains of hernia pain. Pt states he has not had pain medication for 3 weeks and states he has taken 3 bottles of tylenol. Pt has had multiple visits for hernia pain. Pt states surgery was rescheduled last month because he "got choked on a piece of chicken". Pt states son did heimlich maneuver on him and broke his ribs. Pt states surgery could not be performed due to broken ribs.

## 2023-07-02 NOTE — ED Provider Notes (Signed)
[place] EMERGENCY DEPARTMENT AT [facility] Provider Note   CSN: [id number] Arrival date & time: 07/02/23  1151     History  Chief Complaint  Patient presents with   Hernia    Hernia pain    Gary Harrington is a 62 y.o. male.  With a history of left inguinal hernia, hypertension, anxiety, depression, chronic pain who presents to the ED for evaluation of left inguinal hernia pain.  He states he has been out of his Percocet for a while and has been taking Tylenol with minimal relief.  He is scheduled for hernia repair with Dr. Henreitta Leber on 07/13/2023.  He states this surgery has been moved back numerous times due to various issues.  Most recently states that he suffered broken ribs after his son performed the Heimlich maneuver on him due to a piece of chicken being stuck in his esophagus.  He denies any urinary symptoms.  No fevers or chills.  No bowel abnormalities.  HPI     Home Medications Prior to Admission medications   Medication Sig Start Date End Date Taking? Authorizing Provider  oxyCODONE-acetaminophen (PERCOCET/ROXICET) 5-325 MG tablet Take 1 tablet by mouth every 8 (eight) hours as needed for severe pain. 07/02/23  Yes Millissa Deese, Edsel Petrin, PA-C  albuterol (VENTOLIN HFA) 108 (90 Base) MCG/ACT inhaler Inhale 2 puffs into the lungs every 6 (six) hours as needed for wheezing or shortness of breath.    [provider]  ALPRAZolam Prudy Feeler) 1 MG tablet Take 1 mg by mouth daily.    [provider]  clindamycin (CLEOCIN) 300 MG capsule Take 1 capsule (300 mg total) by mouth 4 (four) times daily. X 7 days 06/11/23   Bethann Berkshire, MD  oxyCODONE-acetaminophen (PERCOCET/ROXICET) 5-325 MG tablet Take 1 tablet by mouth every 6 (six) hours as needed for severe pain. 06/12/23   Carmel Sacramento A, PA-C      Allergies    Codeine, Other, Penicillins, and Prednisone    Review of Systems   Review of Systems  Gastrointestinal:        Inguinal hernia  All other  systems reviewed and are negative.   Physical Exam Updated Vital Signs BP 106/84 (BP Location: Right Arm)   Pulse 79   Temp 97.7 F (36.5 C) (Oral)   Resp 20   Ht 5\' 9"  (1.753 m)   Wt 92.1 kg   SpO2 93%   BMI 29.98 kg/m  Physical Exam Vitals and nursing note reviewed.  Constitutional:      General: He is not in acute distress.    Appearance: Normal appearance. He is normal weight. He is not ill-appearing.  HENT:     Head: Normocephalic and atraumatic.  Pulmonary:     Effort: Pulmonary effort is normal. No respiratory distress.  Abdominal:     General: Abdomen is flat.     Comments: Left inguinal hernia with extension into the left hemiscrotum.  This is soft and reducible but returns immediately afterwards. no crepitus. No overlying skin changes  Musculoskeletal:        General: Normal range of motion.     Cervical back: Neck supple.  Skin:    General: Skin is warm and dry.  Neurological:     Mental Status: He is alert and oriented to person, place, and time.  Psychiatric:        Mood and Affect: Mood normal.        Behavior: Behavior normal.     ED Results /  Procedures / Treatments   Labs (all labs ordered are listed, but only abnormal results are displayed) Labs Reviewed - No data to display  EKG None  Radiology No results found.  Procedures Procedures    Medications Ordered in ED Medications  oxyCODONE-acetaminophen (PERCOCET/ROXICET) 5-325 MG per tablet 1 tablet (1 tablet Oral Given 07/02/23 1313)    ED Course/ Medical Decision Making/ A&P                             Medical Decision Making Risk Prescription drug management.  This patient presents to the ED for concern of left inguinal hernia pain, this involves an extensive number of treatment options, and is a complaint that carries with it a high risk of complications and morbidity.  The differential diagnosis includes incarcerated or strangulated inguinal hernia  My initial workup includes  pain control  Additional history obtained from: Nursing notes from this visit. Previous records within EMR system ED visits for similar on 06/02/2023, 06/12/2023, 06/17/2023  Afebrile, hemodynamically stable.  62 year old male presenting to the ED for evaluation of left inguinal hernia pain.  He has had this hernia for quite some time now.  He is scheduled to have it surgically repaired on 07/13/2023.  He has missed or rescheduled the surgery numerous times for various reasons.  Patient was treated for pain in the ED.  He was sent a short course of Percocet after PDMP reviewed showing he has not had any pain prescriptions since 06/12/2023.  He was strongly encouraged to keep his appointment with his surgeon.  He was educated on potential side effects of opioids.  He was encouraged to use alternate therapies for pain.  He was given return precautions.  Stable discharge.  At this time there does not appear to be any evidence of an acute emergency medical condition and the patient appears stable for discharge with appropriate outpatient follow up. Diagnosis was discussed with patient who verbalizes understanding of care plan and is agreeable to discharge. I have discussed return precautions with patient who verbalizes understanding. Patient encouraged to follow-up with their PCP within 1 week. All questions answered.  Note: Portions of this report may have been transcribed using voice recognition software. Every effort was made to ensure accuracy; however, inadvertent computerized transcription errors may still be present.        Final Clinical Impression(s) / ED Diagnoses Final diagnoses:  Unilateral inguinal hernia without obstruction or gangrene, recurrence not specified    Rx / DC Orders ED Discharge Orders          Ordered    oxyCODONE-acetaminophen (PERCOCET/ROXICET) 5-325 MG tablet  Every 8 hours PRN        07/02/23 1316              Michelle Piper, Cordelia Poche 07/02/23 1317     Kommor, Wyn Forster, MD 07/02/23 2222

## 2023-07-09 ENCOUNTER — Encounter (HOSPITAL_COMMUNITY): Payer: Self-pay | Admitting: *Deleted

## 2023-07-09 ENCOUNTER — Other Ambulatory Visit: Payer: Self-pay

## 2023-07-09 ENCOUNTER — Encounter (HOSPITAL_COMMUNITY)
Admission: RE | Admit: 2023-07-09 | Discharge: 2023-07-09 | Disposition: A | Discharge status: Home / Self Care | Payer: Medicaid Other | Source: Ambulatory Visit | Attending: General Surgery | Admitting: General Surgery

## 2023-07-09 ENCOUNTER — Encounter (HOSPITAL_COMMUNITY): Payer: Self-pay

## 2023-07-09 ENCOUNTER — Emergency Department (HOSPITAL_COMMUNITY)
Admission: EM | Admit: 2023-07-09 | Discharge: 2023-07-09 | Disposition: A | Discharge status: Home / Self Care | Payer: Medicaid Other | Attending: Emergency Medicine | Admitting: Emergency Medicine

## 2023-07-09 DIAGNOSIS — Z8673 Personal history of transient ischemic attack (TIA), and cerebral infarction without residual deficits: Secondary | ICD-10-CM | POA: Insufficient documentation

## 2023-07-09 DIAGNOSIS — K0889 Other specified disorders of teeth and supporting structures: Secondary | ICD-10-CM

## 2023-07-09 DIAGNOSIS — J449 Chronic obstructive pulmonary disease, unspecified: Secondary | ICD-10-CM | POA: Insufficient documentation

## 2023-07-09 DIAGNOSIS — I10 Essential (primary) hypertension: Secondary | ICD-10-CM | POA: Diagnosis not present

## 2023-07-09 MED ORDER — CLINDAMYCIN HCL 150 MG PO CAPS
300.0000 mg | ORAL_CAPSULE | Freq: Once | ORAL | Status: AC
  Administered 2023-07-09: 300 mg via ORAL
  Filled 2023-07-09: qty 2

## 2023-07-09 MED ORDER — CLINDAMYCIN HCL 300 MG PO CAPS: 300.0000 mg | ORAL_CAPSULE | Freq: Three times a day (TID) | ORAL | 0 refills | Status: AC

## 2023-07-09 MED ORDER — CLINDAMYCIN HCL 300 MG PO CAPS: 300.0000 mg | ORAL_CAPSULE | Freq: Three times a day (TID) | ORAL | 0 refills | Status: DC

## 2023-07-09 MED ORDER — IBUPROFEN 800 MG PO TABS
800.0000 mg | ORAL_TABLET | Freq: Once | ORAL | Status: AC
  Administered 2023-07-09: 800 mg via ORAL
  Filled 2023-07-09: qty 1

## 2023-07-09 NOTE — ED Triage Notes (Signed)
Pt c/o dental pain to left upper jaw; pt states he has a dentist appointment on 8/3 to have the tooth removed

## 2023-07-09 NOTE — Discharge Instructions (Addendum)
Please take your antibiotics as prescribed. Take tylenol/ibuprofen every 6 hours for pain. I recommend close follow-up with a dentist for reevaluation.  Please do not hesitate to return to emergency department if worrisome signs symptoms we discussed become apparent.

## 2023-07-09 NOTE — ED Provider Notes (Signed)
EMERGENCY DEPARTMENT AT [facility] Provider Note   CSN: [id number] Arrival date & time: 07/09/23  0347     History  Chief Complaint  Patient presents with   Dental Pain    Gary Harrington is a 62 y.o. male history of hypertension, COPD presents today for evaluation of dental pain.  Patient states that he started to have pain in his upper jaw in the last 2 to 3 weeks.  He was evaluated last time about 2 weeks ago and was given clindamycin which he states helped the pain significantly.  He is here today because the pain is back.  Patient states he does not have money to pick up medication today and will pick it up tomorrow.  He denies any fever, nausea vomiting, chest pain, shortness of breath.  States he has an appointment with his dentist on 8/3 to have a procedure.   Dental Pain     Past Medical History:  Diagnosis Date   Anxiety    Chronic pain    COPD (chronic obstructive pulmonary disease) (HCC)    Depression    Dizziness    Dyspnea    Headache    Hypertension    Stroke (HCC) 2000   Past Surgical History:  Procedure Laterality Date   TONSILLECTOMY  age 22     Home Medications Prior to Admission medications   Medication Sig Start Date End Date Taking? Authorizing Provider  clindamycin (CLEOCIN) 300 MG capsule Take 1 capsule (300 mg total) by mouth 3 (three) times daily for 7 days. 07/09/23 07/16/23 Yes Jeanelle Malling, PA  albuterol (VENTOLIN HFA) 108 (90 Base) MCG/ACT inhaler Inhale 2 puffs into the lungs every 6 (six) hours as needed for wheezing or shortness of breath.    [provider]  ALPRAZolam Prudy Feeler) 1 MG tablet Take 1 mg by mouth daily.    [provider]  clindamycin (CLEOCIN) 300 MG capsule Take 1 capsule (300 mg total) by mouth 4 (four) times daily. X 7 days 06/11/23   Bethann Berkshire, MD  oxyCODONE-acetaminophen (PERCOCET/ROXICET) 5-325 MG tablet Take 1 tablet by mouth every 6 (six) hours as needed for severe pain. 06/12/23    Carmel Sacramento A, PA-C  oxyCODONE-acetaminophen (PERCOCET/ROXICET) 5-325 MG tablet Take 1 tablet by mouth every 8 (eight) hours as needed for severe pain. 07/02/23   Schutt, Edsel Petrin, PA-C      Allergies    Codeine, Other, Penicillins, and Prednisone    Review of Systems   Review of Systems Negative except as per HPI.  Physical Exam Updated Vital Signs BP 124/83 (BP Location: Right Arm)   Pulse 78   Temp 98 F (36.7 C) (Oral)   Resp 18   Ht 5\' 9"  (1.753 m)   Wt 92 kg   SpO2 100%   BMI 29.95 kg/m  Physical Exam Vitals and nursing note reviewed.  Constitutional:      Appearance: Normal appearance.  HENT:     Head: Normocephalic and atraumatic.     Mouth/Throat:     Mouth: Mucous membranes are moist.     Comments: Poor dentition with many missing teeth. Swelling to oral mucosa on the cheek near the left upper gingiva. Eyes:     General: No scleral icterus. Cardiovascular:     Rate and Rhythm: Normal rate and regular rhythm.     Pulses: Normal pulses.     Heart sounds: Normal heart sounds.  Pulmonary:     Effort: Pulmonary effort  is normal.     Breath sounds: Normal breath sounds.  Abdominal:     General: Abdomen is flat.     Palpations: Abdomen is soft.     Tenderness: There is no abdominal tenderness.  Musculoskeletal:        General: No deformity.  Skin:    General: Skin is warm.     Findings: No rash.  Neurological:     General: No focal deficit present.     Mental Status: He is alert.  Psychiatric:        Mood and Affect: Mood normal.     ED Results / Procedures / Treatments   Labs (all labs ordered are listed, but only abnormal results are displayed) Labs Reviewed - No data to display  EKG None  Radiology No results found.  Procedures Procedures    Medications Ordered in ED Medications  ibuprofen (ADVIL) tablet 800 mg (has no administration in time range)  clindamycin (CLEOCIN) capsule 300 mg (has no administration in time range)     ED Course/ Medical Decision Making/ A&P                             Medical Decision Making  62 yo male presents for dental pain. Patient not immunosuppressed, afebrile and well appearing with patent airway, have low suspicfion for deep space infection or any concern for airway compromise. No evidence of RPA, PTA, Ludwig's angina, periapical abscess. Instructed patient to continue to treat pain with ibuprofen/acetaminophen until they see a dentist. Patient discharged home with clindamycin and will follow up with dentist on 8/3. Discussed return precautions for odontogenic infections and other dental pain emergencies.   Disposition Continued outpatient therapy. Follow-up with a dentist recommended for reevaluation of symptoms. Treatment plan discussed with patient.  Pt acknowledged understanding was agreeable to the plan. Worrisome signs and symptoms were discussed with patient, and patient acknowledged understanding to return to the ED if they noticed these signs and symptoms. Patient was stable upon discharge.   This chart was dictated using voice recognition software.  Despite best efforts to proofread,  errors can occur which can change the documentation meaning.         Final Clinical Impression(s) / ED Diagnoses Final diagnoses:  Pain, dental    Rx / DC Orders ED Discharge Orders          Ordered    clindamycin (CLEOCIN) 300 MG capsule  3 times daily        07/09/23 0926              Jeanelle Malling, PA 07/09/23 4098    Bethann Berkshire, MD 07/12/23 [phone]

## 2023-07-13 ENCOUNTER — Ambulatory Visit (HOSPITAL_COMMUNITY): Admission: RE | Admit: 2023-07-13 | Payer: Medicaid Other | Source: Home / Self Care | Admitting: General Surgery

## 2023-07-13 ENCOUNTER — Encounter (HOSPITAL_COMMUNITY): Payer: Self-pay | Admitting: General Surgery

## 2023-07-13 ENCOUNTER — Encounter (HOSPITAL_COMMUNITY): Admission: RE | Payer: Self-pay | Source: Home / Self Care

## 2023-07-13 DIAGNOSIS — K4030 Unilateral inguinal hernia, with obstruction, without gangrene, not specified as recurrent: Secondary | ICD-10-CM | POA: Diagnosis present

## 2023-07-13 SURGERY — REPAIR, HERNIA, INGUINAL, ROBOT-ASSISTED, LAPAROSCOPIC, USING MESH
Anesthesia: General | Laterality: Left

## 2023-08-18 ENCOUNTER — Emergency Department (HOSPITAL_COMMUNITY)
Admission: EM | Admit: 2023-08-18 | Discharge: 2023-08-18 | Disposition: A | Discharge status: Home / Self Care | Payer: Medicaid Other | Attending: Emergency Medicine | Admitting: Emergency Medicine

## 2023-08-18 ENCOUNTER — Other Ambulatory Visit: Payer: Self-pay

## 2023-08-18 ENCOUNTER — Emergency Department (HOSPITAL_COMMUNITY): Payer: Medicaid Other

## 2023-08-18 DIAGNOSIS — R059 Cough, unspecified: Secondary | ICD-10-CM | POA: Diagnosis present

## 2023-08-18 DIAGNOSIS — J069 Acute upper respiratory infection, unspecified: Secondary | ICD-10-CM | POA: Diagnosis not present

## 2023-08-18 DIAGNOSIS — Z1152 Encounter for screening for COVID-19: Secondary | ICD-10-CM | POA: Diagnosis not present

## 2023-08-18 LAB — RESP PANEL BY RT-PCR (RSV, FLU A&B, COVID)  RVPGX2
Influenza A by PCR: NEGATIVE
Influenza B by PCR: NEGATIVE
Resp Syncytial Virus by PCR: NEGATIVE
SARS Coronavirus 2 by RT PCR: NEGATIVE

## 2023-08-18 MED ORDER — FLUTICASONE PROPIONATE 50 MCG/ACT NA SUSP: 2.0000 | Freq: Every day | NASAL | 0 refills | Status: AC

## 2023-08-18 MED ORDER — PSEUDOEPHEDRINE HCL 30 MG PO TABS: 30.0000 mg | ORAL_TABLET | ORAL | 0 refills | Status: DC | PRN

## 2023-08-18 NOTE — ED Triage Notes (Signed)
Pt c/o cough and congestion x 4 days.  

## 2023-08-18 NOTE — ED Provider Notes (Signed)
[place] EMERGENCY DEPARTMENT AT [facility] Provider Note   CSN: [id number] Arrival date & time: 08/18/23  0426     History  Chief Complaint  Patient presents with   Cough    Gary Harrington is a 62 y.o. male.  62 year old male that presents to the ER today secondary to congestion.  Patient states he thinks it is from his wife turned the air conditioner up to high at night which is caused him to have congestion before.  He also wonders if maybe he has COVID.  No exposures to that or any other viral illnesses.  No fevers.  No productive cough.  Just runny nose and congestion.   Cough      Home Medications Prior to Admission medications   Medication Sig Start Date End Date Taking? Authorizing Provider  fluticasone (FLONASE) 50 MCG/ACT nasal spray Place 2 sprays into both nostrils daily. 08/18/23  Yes Naeema Patlan, Barbara Cower, MD  pseudoephedrine (SUDAFED) 30 MG tablet Take 1 tablet (30 mg total) by mouth every 4 (four) hours as needed for congestion. 08/18/23  Yes Ocean Schildt, Barbara Cower, MD  albuterol (VENTOLIN HFA) 108 (90 Base) MCG/ACT inhaler Inhale 2 puffs into the lungs every 6 (six) hours as needed for wheezing or shortness of breath.    [provider]  ALPRAZolam Prudy Feeler) 1 MG tablet Take 1 mg by mouth daily.    [provider]  clindamycin (CLEOCIN) 300 MG capsule Take 1 capsule (300 mg total) by mouth 4 (four) times daily. X 7 days 06/11/23   Bethann Berkshire, MD  oxyCODONE-acetaminophen (PERCOCET/ROXICET) 5-325 MG tablet Take 1 tablet by mouth every 6 (six) hours as needed for severe pain. 06/12/23   Carmel Sacramento A, PA-C  oxyCODONE-acetaminophen (PERCOCET/ROXICET) 5-325 MG tablet Take 1 tablet by mouth every 8 (eight) hours as needed for severe pain. 07/02/23   Schutt, Edsel Petrin, PA-C      Allergies    Codeine, Other, Penicillins, and Prednisone    Review of Systems   Review of Systems  Respiratory:  Positive for cough.     Physical Exam Updated Vital  Signs BP 117/78   Pulse 65   Temp (!) 97.5 F (36.4 C)   Resp 18   Ht 5\' 9"  (1.753 m)   Wt 95 kg   SpO2 100%   BMI 30.93 kg/m  Physical Exam Vitals and nursing note reviewed.  Constitutional:      Appearance: He is well-developed.  HENT:     Head: Normocephalic and atraumatic.  Eyes:     Pupils: Pupils are equal, round, and reactive to light.  Cardiovascular:     Rate and Rhythm: Normal rate.  Pulmonary:     Effort: Pulmonary effort is normal. No respiratory distress.  Abdominal:     General: There is no distension.  Musculoskeletal:        General: Normal range of motion.     Cervical back: Normal range of motion.  Skin:    General: Skin is warm and dry.  Neurological:     General: No focal deficit present.     Mental Status: He is alert.     ED Results / Procedures / Treatments   Labs (all labs ordered are listed, but only abnormal results are displayed) Labs Reviewed  RESP PANEL BY RT-PCR (RSV, FLU A&B, COVID)  [id number]    EKG None  Radiology DG Chest 2 View  Result Date: 08/18/2023 CLINICAL DATA:  Cough and congestion for 4 days. EXAM:  CHEST - 2 VIEW COMPARISON:  05/01/2023 FINDINGS: Heart size and mediastinal contours are unremarkable. There is no pleural fluid or interstitial edema. Chronic coarsened interstitial markings are unchanged. No superimposed airspace consolidation. Remote left rib deformities are again noted and appears similar to previous exam. IMPRESSION: 1. No acute cardiopulmonary disease. 2. Chronic coarsened interstitial markings. Electronically Signed   By: Signa Kell M.D.   On: 08/18/2023 05:12    Procedures Procedures    Medications Ordered in ED Medications - No data to display  ED Course/ Medical Decision Making/ A&P                                 Medical Decision Making Amount and/or Complexity of Data Reviewed Radiology: ordered.  Risk OTC drugs.   62 year old male with upper airway congestion.  No indication for  antibiotics.  COVID-negative.  Will suggest supportive care and symptomatic treatment.   Final Clinical Impression(s) / ED Diagnoses Final diagnoses:  Upper respiratory tract infection, unspecified type    Rx / DC Orders ED Discharge Orders          Ordered    pseudoephedrine (SUDAFED) 30 MG tablet  Every 4 hours PRN        08/18/23 0557    fluticasone (FLONASE) 50 MCG/ACT nasal spray  Daily        08/18/23 0557              Kisean Rollo, Barbara Cower, MD 08/18/23 [phone]

## 2023-09-10 ENCOUNTER — Emergency Department (HOSPITAL_COMMUNITY)
Admission: EM | Admit: 2023-09-10 | Discharge: 2023-09-10 | Discharge status: Against Medical Advice | Payer: Medicaid Other | Attending: Emergency Medicine | Admitting: Emergency Medicine

## 2023-09-10 ENCOUNTER — Other Ambulatory Visit: Payer: Self-pay

## 2023-09-10 ENCOUNTER — Encounter (HOSPITAL_COMMUNITY): Payer: Self-pay | Admitting: Emergency Medicine

## 2023-09-10 DIAGNOSIS — Z5321 Procedure and treatment not carried out due to patient leaving prior to being seen by health care provider: Secondary | ICD-10-CM | POA: Insufficient documentation

## 2023-09-10 DIAGNOSIS — M79605 Pain in left leg: Secondary | ICD-10-CM | POA: Diagnosis present

## 2023-09-10 NOTE — ED Triage Notes (Signed)
Per pt he has had left leg pain from knee to ankle for 2 weeks, no injury verbalized.

## 2023-09-12 ENCOUNTER — Emergency Department (HOSPITAL_COMMUNITY): Payer: Medicaid Other

## 2023-09-12 ENCOUNTER — Encounter (HOSPITAL_COMMUNITY): Payer: Self-pay | Admitting: Emergency Medicine

## 2023-09-12 ENCOUNTER — Emergency Department (HOSPITAL_COMMUNITY)
Admission: EM | Admit: 2023-09-12 | Discharge: 2023-09-12 | Disposition: A | Discharge status: Home / Self Care | Payer: Medicaid Other | Attending: Emergency Medicine | Admitting: Emergency Medicine

## 2023-09-12 ENCOUNTER — Other Ambulatory Visit: Payer: Self-pay

## 2023-09-12 DIAGNOSIS — Z8673 Personal history of transient ischemic attack (TIA), and cerebral infarction without residual deficits: Secondary | ICD-10-CM | POA: Diagnosis not present

## 2023-09-12 DIAGNOSIS — J449 Chronic obstructive pulmonary disease, unspecified: Secondary | ICD-10-CM | POA: Insufficient documentation

## 2023-09-12 DIAGNOSIS — M79605 Pain in left leg: Secondary | ICD-10-CM | POA: Insufficient documentation

## 2023-09-12 DIAGNOSIS — F17200 Nicotine dependence, unspecified, uncomplicated: Secondary | ICD-10-CM | POA: Insufficient documentation

## 2023-09-12 DIAGNOSIS — I10 Essential (primary) hypertension: Secondary | ICD-10-CM | POA: Insufficient documentation

## 2023-09-12 NOTE — ED Notes (Signed)
Pt left lower leg has no redness, swelling, or warmth to touch. He does however have pain upon palpation.

## 2023-09-12 NOTE — ED Triage Notes (Signed)
Pt with c/o L leg pain "from knee down". Pt denies injury. States he has tried Tylenol without relief.

## 2023-09-12 NOTE — ED Provider Notes (Addendum)
[place] EMERGENCY DEPARTMENT AT [facility] Provider Note   CSN: [id number] Arrival date & time: 09/12/23  0522     History  Chief Complaint  Patient presents with   Leg Pain    Gary Harrington is a 63 y.o. male.  Patient with several day complaint of left leg pain from the knee down.  Denies any injury.  Chart review shows that patient is on a care plan.  Past medical history significant for chronic pain hypertension stroke in 2000 COPD anxiety depression.  Patient is an everyday smoker.       Home Medications Prior to Admission medications   Medication Sig Start Date End Date Taking? Authorizing Provider  albuterol (VENTOLIN HFA) 108 (90 Base) MCG/ACT inhaler Inhale 2 puffs into the lungs every 6 (six) hours as needed for wheezing or shortness of breath.    [provider]  ALPRAZolam Prudy Feeler) 1 MG tablet Take 1 mg by mouth daily.    [provider]  clindamycin (CLEOCIN) 300 MG capsule Take 1 capsule (300 mg total) by mouth 4 (four) times daily. X 7 days 06/11/23   Bethann Berkshire, MD  fluticasone [facility]) 50 MCG/ACT nasal spray Place 2 sprays into both nostrils daily. 08/18/23   Mesner, Barbara Cower, MD  oxyCODONE-acetaminophen (PERCOCET/ROXICET) 5-325 MG tablet Take 1 tablet by mouth every 6 (six) hours as needed for severe pain. 06/12/23   Carmel Sacramento A, PA-C  oxyCODONE-acetaminophen (PERCOCET/ROXICET) 5-325 MG tablet Take 1 tablet by mouth every 8 (eight) hours as needed for severe pain. 07/02/23   Schutt, Edsel Petrin, PA-C  pseudoephedrine (SUDAFED) 30 MG tablet Take 1 tablet (30 mg total) by mouth every 4 (four) hours as needed for congestion. 08/18/23   Mesner, Barbara Cower, MD      Allergies    Codeine, Other, Penicillins, and Prednisone    Review of Systems   Review of Systems  Constitutional:  Negative for chills and fever.  HENT:  Negative for rhinorrhea and sore throat.   Eyes:  Negative for visual disturbance.  Respiratory:  Negative for  cough and shortness of breath.   Cardiovascular:  Negative for chest pain and leg swelling.  Gastrointestinal:  Negative for abdominal pain, diarrhea, nausea and vomiting.  Genitourinary:  Negative for dysuria.  Musculoskeletal:  Negative for back pain and neck pain.  Skin:  Negative for rash.  Neurological:  Negative for dizziness, light-headedness and headaches.  Hematological:  Does not bruise/bleed easily.  Psychiatric/Behavioral:  Negative for confusion.     Physical Exam Updated Vital Signs BP 130/83 (BP Location: Left Arm)   Pulse 73   Temp (!) 97.5 F (36.4 C) (Oral)   Resp 18   Ht 1.753 m (5\' 9" )   Wt 91 kg   SpO2 98%   BMI 29.63 kg/m  Physical Exam Vitals and nursing note reviewed.  Constitutional:      General: He is not in acute distress.    Appearance: Normal appearance. He is well-developed.  HENT:     Head: Normocephalic and atraumatic.  Eyes:     Extraocular Movements: Extraocular movements intact.     Conjunctiva/sclera: Conjunctivae normal.     Pupils: Pupils are equal, round, and reactive to light.  Cardiovascular:     Rate and Rhythm: Normal rate and regular rhythm.     Heart sounds: No murmur heard. Pulmonary:     Effort: Pulmonary effort is normal. No respiratory distress.     Breath sounds: Normal breath sounds.  Abdominal:  Palpations: Abdomen is soft.     Tenderness: There is no abdominal tenderness.  Musculoskeletal:        General: No swelling or tenderness.     Cervical back: Neck supple.     Left lower leg: No edema.     Comments: Left leg without any swelling or redness.  Dorsalis pedis pulse 2+ very strong.  Good cap refill to toes.  Good movement of toes.  No swelling of the foot ankle or knee no erythema.  No calf tenderness.  Skin:    General: Skin is warm and dry.     Capillary Refill: Capillary refill takes less than 2 seconds.  Neurological:     General: No focal deficit present.     Mental Status: He is alert and oriented  to person, place, and time. Mental status is at baseline.  Psychiatric:        Mood and Affect: Mood normal.     ED Results / Procedures / Treatments   Labs (all labs ordered are listed, but only abnormal results are displayed) Labs Reviewed - No data to display  EKG None  Radiology No results found.  Procedures Procedures    Medications Ordered in ED Medications - No data to display  ED Course/ Medical Decision Making/ A&P                                 Medical Decision Making Amount and/or Complexity of Data Reviewed Radiology: ordered.   Will get x-ray of left tib-fib.  No concerns for deep vein thrombosis based on exam.  No concerns for arterial vascular compromise.  Patient is under a care plan not to receive narcotics.  X-ray of the left tib-fib without any acute findings.   Final Clinical Impression(s) / ED Diagnoses Final diagnoses:  Left leg pain    Rx / DC Orders ED Discharge Orders     None         Vanetta Mulders, MD 09/12/23 0730    Vanetta Mulders, MD 09/12/23 2725    Vanetta Mulders, MD 09/12/23 [phone]

## 2023-09-12 NOTE — Discharge Instructions (Signed)
Recommend extra strength Tylenol 2 tablets every 8 hours.  You are on a care plan so we cannot provide narcotics.  Recommend you follow-up with primary care doctor as well as orthopedics referral information provided above to orthopedics here locally.  X-ray without any acute bony abnormalities.  Explanation for the leg pain at this time is not clear.  But no evidence of any vascular compromise or concern for deep vein thrombosis.

## 2023-11-21 ENCOUNTER — Other Ambulatory Visit: Payer: Self-pay

## 2023-11-21 ENCOUNTER — Emergency Department (HOSPITAL_COMMUNITY)
Admission: EM | Admit: 2023-11-21 | Discharge: 2023-11-21 | Disposition: A | Discharge status: Home / Self Care | Payer: Medicaid Other | Attending: Emergency Medicine | Admitting: Emergency Medicine

## 2023-11-21 ENCOUNTER — Encounter (HOSPITAL_COMMUNITY): Payer: Self-pay | Admitting: Emergency Medicine

## 2023-11-21 DIAGNOSIS — R600 Localized edema: Secondary | ICD-10-CM | POA: Diagnosis present

## 2023-11-21 LAB — CBC WITH DIFFERENTIAL/PLATELET
Abs Immature Granulocytes: 0 10*3/uL (ref 0.00–0.07)
Basophils Absolute: 0 10*3/uL (ref 0.0–0.1)
Basophils Relative: 0 %
Eosinophils Absolute: 0.6 10*3/uL — ABNORMAL HIGH (ref 0.0–0.5)
Eosinophils Relative: 6 %
HCT: 45.6 % (ref 39.0–52.0)
Hemoglobin: 14.2 g/dL (ref 13.0–17.0)
Lymphocytes Relative: 28 %
Lymphs Abs: 2.6 10*3/uL (ref 0.7–4.0)
MCH: 21.5 pg — ABNORMAL LOW (ref 26.0–34.0)
MCHC: 31.1 g/dL (ref 30.0–36.0)
MCV: 69 fL — ABNORMAL LOW (ref 80.0–100.0)
Monocytes Absolute: 0.4 10*3/uL (ref 0.1–1.0)
Monocytes Relative: 4 %
Neutro Abs: 5.8 10*3/uL (ref 1.7–7.7)
Neutrophils Relative %: 62 %
Platelets: 273 10*3/uL (ref 150–400)
RBC: 6.61 MIL/uL — ABNORMAL HIGH (ref 4.22–5.81)
RDW: 17.2 % — ABNORMAL HIGH (ref 11.5–15.5)
WBC: 9.4 10*3/uL (ref 4.0–10.5)
nRBC: 0 % (ref 0.0–0.2)

## 2023-11-21 LAB — BASIC METABOLIC PANEL
Anion gap: 9 (ref 5–15)
BUN: 20 mg/dL (ref 8–23)
CO2: 24 mmol/L (ref 22–32)
Calcium: 9.7 mg/dL (ref 8.9–10.3)
Chloride: 107 mmol/L (ref 98–111)
Creatinine, Ser: 0.97 mg/dL (ref 0.61–1.24)
GFR, Estimated: >60 mL/min (ref >60)
Glucose, Bld: 94 mg/dL (ref 70–99)
Potassium: 4.7 mmol/L (ref 3.5–5.1)
Sodium: 140 mmol/L (ref 135–145)

## 2023-11-21 MED ORDER — FUROSEMIDE 20 MG PO TABS: 20.0000 mg | ORAL_TABLET | Freq: Every day | ORAL | 0 refills | Status: DC | PRN

## 2023-11-21 NOTE — ED Provider Notes (Signed)
[place] EMERGENCY DEPARTMENT AT [facility] Provider Note   CSN: [id number] Arrival date & time: 11/21/23  5284     History  Chief Complaint  Patient presents with   Peripheral Edema    Gary Harrington is a 62 y.o. male.  62 year old male who presents ER today with lower leg pain and edema.  Patient states that he has not really had this in the last couple years but over the last few days has been pretty consistent.  He states that it is worse after being on his feet all day and improves overnight but still present the morning.  Associated with pain.  No chest pain or shortness of breath.  Normal urine output.  No recent illnesses.  No history of heart failure but does have history of stroke.        Home Medications Prior to Admission medications   Medication Sig Start Date End Date Taking? Authorizing Provider  furosemide (LASIX) 20 MG tablet Take 1 tablet (20 mg total) by mouth daily as needed for edema. 11/21/23  Yes Landy Mace, Barbara Cower, MD  albuterol (VENTOLIN HFA) 108 (90 Base) MCG/ACT inhaler Inhale 2 puffs into the lungs every 6 (six) hours as needed for wheezing or shortness of breath.    [provider]  ALPRAZolam Prudy Feeler) 1 MG tablet Take 1 mg by mouth daily.    [provider]  clindamycin (CLEOCIN) 300 MG capsule Take 1 capsule (300 mg total) by mouth 4 (four) times daily. X 7 days 06/11/23   Bethann Berkshire, MD  fluticasone [facility]) 50 MCG/ACT nasal spray Place 2 sprays into both nostrils daily. 08/18/23   Sabriya Yono, Barbara Cower, MD  oxyCODONE-acetaminophen (PERCOCET/ROXICET) 5-325 MG tablet Take 1 tablet by mouth every 6 (six) hours as needed for severe pain. 06/12/23   Carmel Sacramento A, PA-C  oxyCODONE-acetaminophen (PERCOCET/ROXICET) 5-325 MG tablet Take 1 tablet by mouth every 8 (eight) hours as needed for severe pain. 07/02/23   Schutt, Edsel Petrin, PA-C  pseudoephedrine (SUDAFED) 30 MG tablet Take 1 tablet (30 mg total) by mouth every 4 (four) hours  as needed for congestion. 08/18/23   Jacqulyn Barresi, Barbara Cower, MD      Allergies    Codeine, Other, Penicillins, and Prednisone    Review of Systems   Review of Systems  Physical Exam Updated Vital Signs BP 122/78   Pulse (!) 57   Temp 98.7 F (37.1 C) (Oral)   Resp 17   Ht 5\' 9"  (1.753 m)   Wt 93 kg   SpO2 95%   BMI 30.27 kg/m  Physical Exam Vitals and nursing note reviewed.  Constitutional:      Appearance: He is well-developed.  HENT:     Head: Normocephalic and atraumatic.  Eyes:     Pupils: Pupils are equal, round, and reactive to light.  Cardiovascular:     Rate and Rhythm: Normal rate.  Pulmonary:     Effort: Pulmonary effort is normal. No respiratory distress.  Abdominal:     General: There is no distension.  Musculoskeletal:        General: Normal range of motion.     Cervical back: Normal range of motion.     Right lower leg: Edema present.     Left lower leg: Edema present.  Skin:    General: Skin is warm and dry.  Neurological:     General: No focal deficit present.     Mental Status: He is alert.     ED Results /  Procedures / Treatments   Labs (all labs ordered are listed, but only abnormal results are displayed) Labs Reviewed  CBC WITH DIFFERENTIAL/PLATELET - Abnormal; Notable for the following components:      Result Value   RBC 6.61 (*)    MCV 69.0 (*)    MCH 21.5 (*)    RDW 17.2 (*)    Eosinophils Absolute 0.6 (*)    All other components within normal limits  BASIC METABOLIC PANEL    EKG None  Radiology No results found.  Procedures Procedures    Medications Ordered in ED Medications - No data to display  ED Course/ Medical Decision Making/ A&P                                 Medical Decision Making Amount and/or Complexity of Data Reviewed Labs: ordered.  Risk Prescription drug management.   Mild symmetric nonpitting edema around his ankles.  No crackles, wheezing, murmurs or other suggestion of heart disease.  Does have  some mildly discolored skin consistent with peripheral vascular disease.  Kidneys are functioning fine.  No evidence of heart failure.  Will give him a few Lasix pills to use as needed at home but will follow-up with PCP for further management.   Final Clinical Impression(s) / ED Diagnoses Final diagnoses:  Peripheral edema    Rx / DC Orders ED Discharge Orders          Ordered    furosemide (LASIX) 20 MG tablet  Daily PRN        11/21/23 0644              Toddrick Sanna, Barbara Cower, MD 11/21/23 [phone]

## 2023-11-21 NOTE — ED Notes (Signed)
ED Provider at bedside. 

## 2023-11-21 NOTE — ED Triage Notes (Signed)
Pt here with c/o swelling to his feet since Monday.

## 2023-11-30 ENCOUNTER — Other Ambulatory Visit: Payer: Self-pay

## 2023-11-30 ENCOUNTER — Encounter (HOSPITAL_COMMUNITY): Payer: Self-pay | Admitting: Emergency Medicine

## 2023-11-30 ENCOUNTER — Emergency Department (HOSPITAL_COMMUNITY)
Admission: EM | Admit: 2023-11-30 | Discharge: 2023-11-30 | Disposition: A | Discharge status: Home / Self Care | Payer: Medicaid Other | Attending: Emergency Medicine | Admitting: Emergency Medicine

## 2023-11-30 DIAGNOSIS — Z8673 Personal history of transient ischemic attack (TIA), and cerebral infarction without residual deficits: Secondary | ICD-10-CM | POA: Insufficient documentation

## 2023-11-30 DIAGNOSIS — M25472 Effusion, left ankle: Secondary | ICD-10-CM | POA: Diagnosis present

## 2023-11-30 DIAGNOSIS — I10 Essential (primary) hypertension: Secondary | ICD-10-CM | POA: Insufficient documentation

## 2023-11-30 DIAGNOSIS — M25473 Effusion, unspecified ankle: Secondary | ICD-10-CM

## 2023-11-30 DIAGNOSIS — J449 Chronic obstructive pulmonary disease, unspecified: Secondary | ICD-10-CM | POA: Diagnosis not present

## 2023-11-30 DIAGNOSIS — F17210 Nicotine dependence, cigarettes, uncomplicated: Secondary | ICD-10-CM | POA: Insufficient documentation

## 2023-11-30 MED ORDER — FUROSEMIDE 20 MG PO TABS: 20.0000 mg | ORAL_TABLET | Freq: Every day | ORAL | 0 refills | Status: DC | PRN

## 2023-11-30 NOTE — ED Triage Notes (Signed)
Pt with c/o increased swelling to L foot/ankle. Seen recently for same but states he cannot get in to see his MD until Jan.

## 2023-11-30 NOTE — Discharge Instructions (Signed)
You were evaluated in the Emergency Department and after careful evaluation, we did not find any emergent condition requiring admission or further testing in the hospital.  Your exam/testing today is overall reassuring.  Can use the Lasix once daily as needed for swelling, follow-up with a primary care doctor.  Please return to the Emergency Department if you experience any worsening of your condition.   Thank you for allowing [place] to be a part of your care.

## 2023-11-30 NOTE — ED Provider Notes (Signed)
[facility]-EMERGENCY DEPT [facility] Emergency Department Provider Note MRN:  [id number]  Arrival date & time: 11/30/23     Chief Complaint   L foot swelling   History of Present Illness   Gary Harrington is a 62 y.o. year-old male with a history of COPD presenting to the ED with chief complaint of left foot swelling.  Pain that he attributes to ankle swelling which she has had on and off for a long time.  Explains that he was recently prescribed Lasix and it seemed to help quite a bit but then he ran out.  Wants more Lasix.  Denies fever, no recent trauma, no other complaints.  Review of Systems  A thorough review of systems was obtained and all systems are negative except as noted in the HPI and PMH.   Patient's Health History    Past Medical History:  Diagnosis Date   Anxiety    Chronic pain    COPD (chronic obstructive pulmonary disease) (HCC)    Depression    Dizziness    Dyspnea    Headache    Hypertension    Stroke (HCC) 2000    Past Surgical History:  Procedure Laterality Date   TONSILLECTOMY  age 2    Family History  Problem Relation Age of Onset   Stroke Father    Heart failure Father     Social History   Socioeconomic History   Marital status: Married    Spouse name: Not on file   Number of children: Not on file   Years of education: Not on file   Highest education level: Not on file  Occupational History   Not on file  Tobacco Use   Smoking status: Every Day    Current packs/day: 3.00    Average packs/day: 3.0 packs/day for 27.0 years (81.0 ttl pk-yrs)    Types: Cigarettes   Smokeless tobacco: Never  Vaping Use   Vaping status: Never Used  Substance and Sexual Activity   Alcohol use: No   Drug use: No   Sexual activity: Not Currently  Other Topics Concern   Not on file  Social History Narrative   Not on file   Social Determinants of Health   Financial Resource Strain: Not on file  Food Insecurity: Not on file  Transportation Needs:  Unmet Transportation Needs (02/18/2022)   Received from [facility], [facility]   PRAPARE - Transportation    Lack of Transportation (Medical): Yes    Lack of Transportation (Non-Medical): Yes  Physical Activity: Not on file  Stress: Not on file  Social Connections: Not on file  Intimate Partner Violence: Not on file     Physical Exam   Vitals:   11/30/23 0400 11/30/23 0430  BP: 104/63 108/60  Pulse:    Resp:    Temp:    SpO2:      CONSTITUTIONAL: Well-appearing, NAD NEURO/PSYCH:  Alert and oriented x 3, no focal deficits EYES:  eyes equal and reactive ENT/NECK:  no LAD, no JVD CARDIO: Regular rate, well-perfused, normal S1 and S2 PULM:  CTAB no wheezing or rhonchi GI/GU:  non-distended, non-tender MSK/SPINE:  No gross deformities, no edema SKIN:  no rash, atraumatic   *Additional and/or pertinent findings included in MDM below  Diagnostic and Interventional Summary    EKG Interpretation Date/Time:    Ventricular Rate:    PR Interval:    QRS Duration:    QT Interval:    QTC Calculation:   R  Axis:      Text Interpretation:         Labs Reviewed - No data to display  No orders to display    Medications - No data to display   Procedures  /  Critical Care Procedures  ED Course and Medical Decision Making  Initial Impression and Ddx Scant edema to bilateral ankles, no erythema or increased warmth, no decreased range of motion, no calf pain or tenderness, highly doubt infection or DVT or other emergent process.  Seems to be a chronic issue for the patient.  Past medical/surgical history that increases complexity of ED encounter: None  Interpretation of Diagnostics Laboratory and/or imaging options to aid in the diagnosis/care of the patient were considered.  After careful history and physical examination, it was determined that there was no indication for diagnostics at this time.  Patient Reassessment and Ultimate Disposition/Management      Discharge  Patient management required discussion with the following services or consulting groups:  None  Complexity of Problems Addressed Acute complicated illness or Injury  Additional Data Reviewed and Analyzed Further history obtained from: Prior ED visit notes  Additional Factors Impacting ED Encounter Risk Prescriptions  Elmer Sow. Pilar Plate, MD [facility] Emergency Medicine [facility] [email or web address]  Final Clinical Impressions(s) / ED Diagnoses     ICD-10-CM   1. Ankle swelling, unspecified laterality  M25.473       ED Discharge Orders          Ordered    furosemide (LASIX) 20 MG tablet  Daily PRN        11/30/23 0513             Discharge Instructions Discussed with and Provided to Patient:    Discharge Instructions      You were evaluated in the Emergency Department and after careful evaluation, we did not find any emergent condition requiring admission or further testing in the hospital.  Your exam/testing today is overall reassuring.  Can use the Lasix once daily as needed for swelling, follow-up with a primary care doctor.  Please return to the Emergency Department if you experience any worsening of your condition.   Thank you for allowing [place] to be a part of your care.      Sabas Sous, MD 11/30/23 [phone]

## 2023-12-23 ENCOUNTER — Other Ambulatory Visit: Payer: Self-pay

## 2023-12-23 ENCOUNTER — Encounter (HOSPITAL_COMMUNITY): Payer: Self-pay

## 2023-12-23 ENCOUNTER — Emergency Department (HOSPITAL_COMMUNITY)
Admission: EM | Admit: 2023-12-23 | Discharge: 2023-12-23 | Disposition: A | Discharge status: Home / Self Care | Payer: Medicaid Other | Attending: Emergency Medicine | Admitting: Emergency Medicine

## 2023-12-23 DIAGNOSIS — M7989 Other specified soft tissue disorders: Secondary | ICD-10-CM | POA: Diagnosis present

## 2023-12-23 DIAGNOSIS — R600 Localized edema: Secondary | ICD-10-CM | POA: Diagnosis not present

## 2023-12-23 MED ORDER — FUROSEMIDE 20 MG PO TABS: 20.0000 mg | ORAL_TABLET | Freq: Every day | ORAL | 0 refills | Status: DC | PRN

## 2023-12-23 NOTE — ED Provider Notes (Signed)
 [place] EMERGENCY DEPARTMENT AT [facility] Provider Note   CSN: [id number] Arrival date & time: 12/23/23  9475     History  Chief Complaint  Patient presents with   Bilateral ankle swelling    Gary Harrington is a 63 y.o. male.  HPI   This patient is a 63 year old male with a heavy alcohol use history though he does not drink anymore.  He presents with bilateral swelling of the legs stating that he has run out of his furosemide .  He has had ultrasounds of his legs several times in the past few years, no evidence of DVT, he has been prescribed furosemide  but states he has run out of the medication in the last few days and now has more leg swelling.  He has a follow-up with his doctor in 2 weeks  Home Medications Prior to Admission medications   Medication Sig Start Date End Date Taking? Authorizing Provider  albuterol  (VENTOLIN  HFA) 108 (90 Base) MCG/ACT inhaler Inhale 2 puffs into the lungs every 6 (six) hours as needed for wheezing or shortness of breath.    [provider]  ALPRAZolam  (XANAX ) 1 MG tablet Take 1 mg by mouth daily.    [provider]  clindamycin  (CLEOCIN ) 300 MG capsule Take 1 capsule (300 mg total) by mouth 4 (four) times daily. X 7 days 06/11/23   Suzette Pac, MD  fluticasone  (FLONASE ) 50 MCG/ACT nasal spray Place 2 sprays into both nostrils daily. 08/18/23   Mesner, Selinda, MD  furosemide  (LASIX ) 20 MG tablet Take 1 tablet (20 mg total) by mouth daily as needed for edema. 12/23/23   Cleotilde Rogue, MD  oxyCODONE -acetaminophen  (PERCOCET/ROXICET) 5-325 MG tablet Take 1 tablet by mouth every 6 (six) hours as needed for severe pain. 06/12/23   Suellen Cantor A, PA-C  oxyCODONE -acetaminophen  (PERCOCET/ROXICET) 5-325 MG tablet Take 1 tablet by mouth every 8 (eight) hours as needed for severe pain. 07/02/23   Schutt, Marsa HERO, PA-C  pseudoephedrine  (SUDAFED) 30 MG tablet Take 1 tablet (30 mg total) by mouth every 4 (four) hours as needed for  congestion. 08/18/23   Mesner, Selinda, MD      Allergies    Codeine, Other, Penicillins, and Prednisone     Review of Systems   Review of Systems  All other systems reviewed and are negative.   Physical Exam Updated Vital Signs BP 128/71   Pulse 74   Temp 97.6 F (36.4 C) (Oral)   Resp 18   SpO2 98%  Physical Exam Vitals and nursing note reviewed.  Constitutional:      General: He is not in acute distress.    Appearance: He is well-developed.  HENT:     Head: Normocephalic and atraumatic.     Mouth/Throat:     Pharynx: No oropharyngeal exudate.  Eyes:     General: No scleral icterus.       Right eye: No discharge.        Left eye: No discharge.     Conjunctiva/sclera: Conjunctivae normal.     Pupils: Pupils are equal, round, and reactive to light.  Neck:     Thyroid: No thyromegaly.     Vascular: No JVD.  Cardiovascular:     Rate and Rhythm: Normal rate and regular rhythm.     Heart sounds: Normal heart sounds. No murmur heard.    No friction rub. No gallop.  Pulmonary:     Effort: Pulmonary effort is normal. No respiratory distress.  Breath sounds: Normal breath sounds. No wheezing or rales.  Abdominal:     General: Bowel sounds are normal. There is no distension.     Palpations: Abdomen is soft. There is no mass.     Tenderness: There is no abdominal tenderness.  Musculoskeletal:        General: No tenderness. Normal range of motion.     Cervical back: Normal range of motion and neck supple.     Right lower leg: Edema present.     Left lower leg: Edema present.     Comments: Symmetrical 2+ pretibial pitting edema  Lymphadenopathy:     Cervical: No cervical adenopathy.  Skin:    General: Skin is warm and dry.     Findings: No erythema or rash.  Neurological:     Mental Status: He is alert.     Coordination: Coordination normal.  Psychiatric:        Behavior: Behavior normal.     ED Results / Procedures / Treatments   Labs (all labs ordered are  listed, but only abnormal results are displayed) Labs Reviewed - No data to display  EKG None  Radiology No results found.  Procedures Procedures    Medications Ordered in ED Medications - No data to display  ED Course/ Medical Decision Making/ A&P                                 Medical Decision Making Risk Prescription drug management.   No distress, no hypoxia, no tachycardia fever or hypotension.  The patient will have a refill of his furosemide , no other indication for acute workup, this is a chronic condition and he has a follow-up with his family doctor.  Recommended lifestyle changes including compression stockings elevation of the feet exercise etc.  Refill of furosemide  sent to pharmacy, patient agreeable        Final Clinical Impression(s) / ED Diagnoses Final diagnoses:  Peripheral edema    Rx / DC Orders ED Discharge Orders          Ordered    furosemide  (LASIX ) 20 MG tablet  Daily PRN        12/23/23 0835              Cleotilde Rogue, MD 12/23/23 [phone]

## 2023-12-23 NOTE — ED Triage Notes (Signed)
Pt c/o bilateral ankle swelling.

## 2023-12-23 NOTE — Discharge Instructions (Signed)
 Take the furosemide tablet once a day for 1 week then as needed, this will help to get some of the fluid off your legs, please exercise more, use compression stockings, ER for worsening symptoms

## 2024-01-05 ENCOUNTER — Ambulatory Visit: Payer: Medicaid Other | Admitting: Orthopedic Surgery

## 2024-01-12 ENCOUNTER — Ambulatory Visit: Payer: Medicaid Other | Admitting: Orthopedic Surgery

## 2024-02-03 ENCOUNTER — Emergency Department (HOSPITAL_COMMUNITY): Payer: Medicaid Other

## 2024-02-03 ENCOUNTER — Other Ambulatory Visit: Payer: Self-pay

## 2024-02-03 ENCOUNTER — Emergency Department (HOSPITAL_COMMUNITY)
Admission: EM | Admit: 2024-02-03 | Discharge: 2024-02-03 | Disposition: A | Discharge status: Home / Self Care | Payer: Medicaid Other

## 2024-02-03 ENCOUNTER — Encounter (HOSPITAL_COMMUNITY): Payer: Self-pay

## 2024-02-03 DIAGNOSIS — Z23 Encounter for immunization: Secondary | ICD-10-CM | POA: Insufficient documentation

## 2024-02-03 DIAGNOSIS — L03115 Cellulitis of right lower limb: Secondary | ICD-10-CM | POA: Insufficient documentation

## 2024-02-03 DIAGNOSIS — R600 Localized edema: Secondary | ICD-10-CM | POA: Diagnosis present

## 2024-02-03 LAB — BASIC METABOLIC PANEL
Anion gap: 8 (ref 5–15)
BUN: 15 mg/dL (ref 8–23)
CO2: 25 mmol/L (ref 22–32)
Calcium: 9.3 mg/dL (ref 8.9–10.3)
Chloride: 103 mmol/L (ref 98–111)
Creatinine, Ser: 0.92 mg/dL (ref 0.61–1.24)
GFR, Estimated: >60 mL/min (ref >60)
Glucose, Bld: 88 mg/dL (ref 70–99)
Potassium: 4.3 mmol/L (ref 3.5–5.1)
Sodium: 136 mmol/L (ref 135–145)

## 2024-02-03 LAB — LACTIC ACID, PLASMA
Lactic Acid, Venous: 1.2 mmol/L (ref 0.5–1.9)
Lactic Acid, Venous: 1.3 mmol/L (ref 0.5–1.9)

## 2024-02-03 LAB — CBC
HCT: 42.9 % (ref 39.0–52.0)
Hemoglobin: 13.1 g/dL (ref 13.0–17.0)
MCH: 20.8 pg — ABNORMAL LOW (ref 26.0–34.0)
MCHC: 30.5 g/dL (ref 30.0–36.0)
MCV: 68.1 fL — ABNORMAL LOW (ref 80.0–100.0)
Platelets: 253 10*3/uL (ref 150–400)
RBC: 6.3 MIL/uL — ABNORMAL HIGH (ref 4.22–5.81)
RDW: 16.4 % — ABNORMAL HIGH (ref 11.5–15.5)
WBC: 9.8 10*3/uL (ref 4.0–10.5)
nRBC: 0.2 % (ref 0.0–0.2)

## 2024-02-03 MED ORDER — TETANUS-DIPHTH-ACELL PERTUSSIS 5-2.5-18.5 LF-MCG/0.5 IM SUSY
0.5000 mL | PREFILLED_SYRINGE | Freq: Once | INTRAMUSCULAR | Status: AC
  Administered 2024-02-03: 0.5 mL via INTRAMUSCULAR
  Filled 2024-02-03: qty 0.5

## 2024-02-03 MED ORDER — FUROSEMIDE 40 MG PO TABS
20.0000 mg | ORAL_TABLET | Freq: Once | ORAL | Status: AC
  Administered 2024-02-03: 20 mg via ORAL
  Filled 2024-02-03: qty 1

## 2024-02-03 MED ORDER — CLINDAMYCIN HCL 300 MG PO CAPS: 300.0000 mg | ORAL_CAPSULE | Freq: Three times a day (TID) | ORAL | 0 refills | Status: AC

## 2024-02-03 MED ORDER — ALPRAZOLAM 0.5 MG PO TABS
0.5000 mg | ORAL_TABLET | Freq: Once | ORAL | Status: AC
  Administered 2024-02-03: 0.5 mg via ORAL
  Filled 2024-02-03: qty 1

## 2024-02-03 MED ORDER — FUROSEMIDE 20 MG PO TABS: 20.0000 mg | ORAL_TABLET | Freq: Every day | ORAL | 0 refills | Status: DC

## 2024-02-03 NOTE — Discharge Instructions (Addendum)
It appears you have an infection.  We are prescribing you antibiotics.  You are also prescribed a new Lasix.  Please follow-up with your primary doctors.  Return immediately for fevers, chills, chest pain, shortness of breath, redness rapidly spreads across right lower extremity or develop any new or worsening symptoms that are concerning to you.

## 2024-02-03 NOTE — ED Notes (Signed)
Cleanse right leg  with NS applied dressing wrap with ace

## 2024-02-03 NOTE — ED Provider Notes (Signed)
[place] EMERGENCY DEPARTMENT AT [facility] Provider Note   CSN: [id number] Arrival date & time: 02/03/24  5366     History  Chief Complaint  Patient presents with   Leg Injury    Gary Harrington is a 63 y.o. male.  63 year old male presents emergency department with bilateral lower extremity edema which is his primary concern as well as wound to his right lower extremity.  Reports 2 to 3 weeks ago he scraped his leg on a car and had an abrasion.  He has been cleaning the wound almost daily, but still is not healing.  No fevers no chills.  Complains of worsening lower extremity edema.  No chest pain no shortness of breath.          Home Medications Prior to Admission medications   Medication Sig Start Date End Date Taking? Authorizing Provider  clindamycin (CLEOCIN) 300 MG capsule Take 1 capsule (300 mg total) by mouth 3 (three) times daily for 7 days. 02/03/24 02/10/24 Yes Oprah Camarena, Harmon Dun, DO  albuterol (VENTOLIN HFA) 108 (90 Base) MCG/ACT inhaler Inhale 2 puffs into the lungs every 6 (six) hours as needed for wheezing or shortness of breath.    [provider]  ALPRAZolam Prudy Feeler) 1 MG tablet Take 1 mg by mouth daily.    [provider]  fluticasone (FLONASE) 50 MCG/ACT nasal spray Place 2 sprays into both nostrils daily. 08/18/23   Mesner, Barbara Cower, MD  furosemide (LASIX) 20 MG tablet Take 1 tablet (20 mg total) by mouth daily as needed for edema. 12/23/23   Eber Hong, MD  oxyCODONE-acetaminophen (PERCOCET/ROXICET) 5-325 MG tablet Take 1 tablet by mouth every 6 (six) hours as needed for severe pain. 06/12/23   Carmel Sacramento A, PA-C  oxyCODONE-acetaminophen (PERCOCET/ROXICET) 5-325 MG tablet Take 1 tablet by mouth every 8 (eight) hours as needed for severe pain. 07/02/23   Schutt, Edsel Petrin, PA-C  pseudoephedrine (SUDAFED) 30 MG tablet Take 1 tablet (30 mg total) by mouth every 4 (four) hours as needed for congestion. 08/18/23   Mesner, Barbara Cower, MD       Allergies    Codeine, Other, Penicillins, and Prednisone    Review of Systems   Review of Systems  Physical Exam Updated Vital Signs BP 138/81 (BP Location: Right Arm)   Pulse 80   Temp 97.8 F (36.6 C) (Oral)   Resp 19   Ht 5\' 9"  (1.753 m)   Wt 93 kg   SpO2 100%   BMI 30.28 kg/m  Physical Exam Vitals and nursing note reviewed.  Constitutional:      General: He is not in acute distress.    Appearance: He is not toxic-appearing.  HENT:     Head: Normocephalic.     Nose: Nose normal.     Mouth/Throat:     Mouth: Mucous membranes are moist.  Eyes:     Conjunctiva/sclera: Conjunctivae normal.  Cardiovascular:     Rate and Rhythm: Normal rate and regular rhythm.  Pulmonary:     Effort: Pulmonary effort is normal.     Breath sounds: Normal breath sounds.  Abdominal:     General: Abdomen is flat. There is no distension.     Tenderness: There is no abdominal tenderness. There is no guarding or rebound.  Musculoskeletal:        General: Normal range of motion.     Right lower leg: Edema present.     Left lower leg: Edema present.  Skin:  General: Skin is warm and dry.     Capillary Refill: Capillary refill takes less than 2 seconds.     Comments: Palm-sized area of erythema to the right medial calf.  There are several small blisters with some clear fluid and honey crusting.  Neurological:     Mental Status: He is alert and oriented to person, place, and time.  Psychiatric:        Behavior: Behavior normal.     ED Results / Procedures / Treatments   Labs (all labs ordered are listed, but only abnormal results are displayed) Labs Reviewed  CBC - Abnormal; Notable for the following components:      Result Value   RBC 6.30 (*)    MCV 68.1 (*)    MCH 20.8 (*)    RDW 16.4 (*)    All other components within normal limits  BASIC METABOLIC PANEL  LACTIC ACID, PLASMA  LACTIC ACID, PLASMA    EKG None  Radiology DG Tibia/Fibula Right Result Date:  02/03/2024 CLINICAL DATA:  Lower extremity pain and edema EXAM: RIGHT TIBIA AND FIBULA - 2 VIEW COMPARISON:  None Available. FINDINGS: No acute fracture or malalignment. No lytic or blastic osseous lesion. No subcutaneous emphysema or radiopaque foreign body. Diffuse reticulation of the subcutaneous fat consistent with edema or cellulitis. IMPRESSION: 1. No evidence of fracture, malalignment, foreign body or bony lesion. 2. Diffuse reticulation of the subcutaneous fat consistent with edema or cellulitis. Electronically Signed   By: Malachy Moan M.D.   On: 02/03/2024 12:45   US Venous Img Lower Unilateral Right Result Date: 02/03/2024 CLINICAL DATA:  Right lower extremity swelling, edema EXAM: RIGHT LOWER EXTREMITY VENOUS DOPPLER ULTRASOUND TECHNIQUE: Gray-scale sonography with compression, as well as color and duplex ultrasound, were performed to evaluate the deep venous system(s) from the level of the common femoral vein through the popliteal and proximal calf veins. COMPARISON:  None Available. FINDINGS: VENOUS Normal compressibility of the common femoral, superficial femoral, and popliteal veins, as well as the visualized calf veins. Visualized portions of profunda femoral vein and great saphenous vein unremarkable. No filling defects to suggest DVT on grayscale or color Doppler imaging. Doppler waveforms show normal direction of venous flow, normal respiratory plasticity and response to augmentation. Limited views of the contralateral common femoral vein are unremarkable. OTHER None. Limitations: none IMPRESSION: Negative. Electronically Signed   By: Malachy Moan M.D.   On: 02/03/2024 12:41    Procedures Procedures    Medications Ordered in ED Medications  furosemide (LASIX) tablet 20 mg (20 mg Oral Given 02/03/24 1134)  Tdap (BOOSTRIX) injection 0.5 mL (0.5 mLs Intramuscular Given 02/03/24 1125)  ALPRAZolam (XANAX) tablet 0.5 mg (0.5 mg Oral Given 02/03/24 1134)    ED Course/ Medical  Decision Making/ A&P Clinical Course as of 02/03/24 1257  Wed Feb 03, 2024  1148 Lab workup reassuring.  No leukocytosis no elevated lactate.  Normal labs.  He is afebrile, nontachycardic.  Low suspicion for systemic infection.  Independently reviewed x-ray.  Does not appear to have osseous abnormalities; awaiting radiology final read.  Also awaiting ultrasound.  Per chart review appears he has history of chronic lower extremity edema.  Suspect cellulitis/impetigo of right lower extremity. [TY]  1250 DG Tibia/Fibula Right MPRESSION: 1. No evidence of fracture, malalignment, foreign body or bony lesion. 2. Diffuse reticulation of the subcutaneous fat consistent with edema or cellulitis.   [TY]  1250 US Venous Img Lower Unilateral Right IMPRESSION: Negative.   [TY]  Clinical Course User Index [TY] Coral Spikes, DO                                 Medical Decision Making This is a 63 year old male presenting emergency department for leg wound and lower extremity edema.  Afebrile nontachycardic hemodynamically stable.  On exam does have bilateral lower extremity edema slightly worse on right compared to left.  Appears to have overlying cellulitis/impetigo to the extremity as well.  Vital signs reassuring.  Workup as noted in ED course reassuring.  Low suspicion for systemic infection.  Does not have DVT.  X-ray without osseous involvement.  Given home dose of Lasix.  He also requested for Xanax for anxiety which was given.  With reassuring vitals and workup will discharge with antibiotics for impetigo/cellulitis.  Amount and/or Complexity of Data Reviewed Labs: ordered. Radiology: ordered. Decision-making details documented in ED Course.  Risk Prescription drug management.         Final Clinical Impression(s) / ED Diagnoses Final diagnoses:  Cellulitis of right lower extremity    Rx / DC Orders ED Discharge Orders          Ordered    clindamycin (CLEOCIN) 300 MG  capsule  3 times daily        02/03/24 1252              Coral Spikes, DO 02/03/24 1257

## 2024-02-03 NOTE — ED Triage Notes (Signed)
Pt arrived via POV c/o right leg injury. Pt reports he scraped his right leg down the running board on the side of his truck. Pt does present with an abrasion to medial lower right leg. Pt reports a pen busted in his pocked and spread ink over the wound while explaining how the wound became discolored in presentation.

## 2024-02-06 ENCOUNTER — Emergency Department (HOSPITAL_COMMUNITY)
Admission: EM | Admit: 2024-02-06 | Discharge: 2024-02-06 | Disposition: A | Discharge status: Home / Self Care | Payer: Medicaid Other | Attending: Emergency Medicine | Admitting: Emergency Medicine

## 2024-02-06 DIAGNOSIS — J449 Chronic obstructive pulmonary disease, unspecified: Secondary | ICD-10-CM | POA: Insufficient documentation

## 2024-02-06 DIAGNOSIS — M79604 Pain in right leg: Secondary | ICD-10-CM | POA: Diagnosis present

## 2024-02-06 DIAGNOSIS — L03115 Cellulitis of right lower limb: Secondary | ICD-10-CM | POA: Diagnosis not present

## 2024-02-06 DIAGNOSIS — I10 Essential (primary) hypertension: Secondary | ICD-10-CM | POA: Diagnosis not present

## 2024-02-06 MED ORDER — IBUPROFEN 800 MG PO TABS: 800.0000 mg | ORAL_TABLET | Freq: Three times a day (TID) | ORAL | 0 refills | Status: DC | PRN

## 2024-02-06 NOTE — Discharge Instructions (Signed)
Clean laceration daily, gently with soap and water and then reapply bandage.  Follow-up with your family doctor next week

## 2024-02-06 NOTE — ED Triage Notes (Signed)
Pt c/o R leg pain ongoing since 2/12 when he injured his leg. Pt denies new injury. Ambulatory to triage. Requesting new bandages and pain medications.

## 2024-02-06 NOTE — ED Provider Notes (Incomplete)
[place] EMERGENCY DEPARTMENT AT [facility] Provider Note   CSN: [id number] Arrival date & time: 02/06/24  4098     History {Add pertinent medical, surgical, social history, OB history to HPI:1} Chief Complaint  Patient presents with   Leg Pain    Gary Harrington is a 63 y.o. male.  Patient has a history of hypertension and COPD.  He was seen a couple days ago with cellulitis to his right leg and is taking his antibiotics.  Patient returns today for recheck and new bandage to leg   Leg Pain      Home Medications Prior to Admission medications   Medication Sig Start Date End Date Taking? Authorizing Provider  ibuprofen (ADVIL) 800 MG tablet Take 1 tablet (800 mg total) by mouth every 8 (eight) hours as needed for moderate pain (pain score 4-6). 02/06/24  Yes Bethann Berkshire, MD  albuterol (VENTOLIN HFA) 108 (90 Base) MCG/ACT inhaler Inhale 2 puffs into the lungs every 6 (six) hours as needed for wheezing or shortness of breath.    [provider]  ALPRAZolam Prudy Feeler) 1 MG tablet Take 1 mg by mouth daily.    [provider]  clindamycin (CLEOCIN) 300 MG capsule Take 1 capsule (300 mg total) by mouth 3 (three) times daily for 7 days. 02/03/24 02/10/24  Coral Spikes, DO  fluticasone (FLONASE) 50 MCG/ACT nasal spray Place 2 sprays into both nostrils daily. 08/18/23   Mesner, Barbara Cower, MD  furosemide (LASIX) 20 MG tablet Take 1 tablet (20 mg total) by mouth daily for 7 days. 02/03/24 02/10/24  Coral Spikes, DO  oxyCODONE-acetaminophen (PERCOCET/ROXICET) 5-325 MG tablet Take 1 tablet by mouth every 6 (six) hours as needed for severe pain. 06/12/23   Carmel Sacramento A, PA-C  oxyCODONE-acetaminophen (PERCOCET/ROXICET) 5-325 MG tablet Take 1 tablet by mouth every 8 (eight) hours as needed for severe pain. 07/02/23   Schutt, Edsel Petrin, PA-C  pseudoephedrine (SUDAFED) 30 MG tablet Take 1 tablet (30 mg total) by mouth every 4 (four) hours as needed for congestion.  08/18/23   Mesner, Barbara Cower, MD      Allergies    Codeine, Other, Penicillins, and Prednisone    Review of Systems   Review of Systems  Physical Exam Updated Vital Signs BP 130/74 (BP Location: Right Arm)   Pulse (!) 59   Temp (!) 97.5 F (36.4 C) (Oral)   Resp 18   Ht 5\' 9"  (1.753 m)   Wt 93 kg   SpO2 100%   BMI 30.27 kg/m  Physical Exam  ED Results / Procedures / Treatments   Labs (all labs ordered are listed, but only abnormal results are displayed) Labs Reviewed - No data to display  EKG None  Radiology No results found.  Procedures Procedures  {Document cardiac monitor, telemetry assessment procedure when appropriate:1}  Medications Ordered in ED Medications - No data to display  ED Course/ Medical Decision Making/ A&P   {   Click here for ABCD2, HEART and other calculatorsREFRESH Note before signing :1}                              Medical Decision Making Risk Prescription drug management.   Patient with healing cellulitis to right lower leg.  He will continue his antibiotics and given Motrin for pain  {Document critical care time when appropriate:1} {Document review of labs and clinical decision tools ie heart score, Chads2Vasc2 etc:1}  {  Document your independent review of radiology images, and any outside records:1} {Document your discussion with family members, caretakers, and with consultants:1} {Document social determinants of health affecting pt's care:1} {Document your decision making why or why not admission, treatments were needed:1} Final Clinical Impression(s) / ED Diagnoses Final diagnoses:  Cellulitis of right lower leg    Rx / DC Orders ED Discharge Orders          Ordered    ibuprofen (ADVIL) 800 MG tablet  Every 8 hours PRN        02/06/24 0818

## 2024-02-08 ENCOUNTER — Emergency Department (HOSPITAL_COMMUNITY)
Admission: EM | Admit: 2024-02-08 | Discharge: 2024-02-09 | Disposition: A | Discharge status: Home / Self Care | Payer: Medicaid Other | Attending: Student | Admitting: Student

## 2024-02-08 ENCOUNTER — Encounter (HOSPITAL_COMMUNITY): Payer: Self-pay | Admitting: Emergency Medicine

## 2024-02-08 ENCOUNTER — Other Ambulatory Visit: Payer: Self-pay

## 2024-02-08 DIAGNOSIS — J449 Chronic obstructive pulmonary disease, unspecified: Secondary | ICD-10-CM | POA: Insufficient documentation

## 2024-02-08 DIAGNOSIS — Z79899 Other long term (current) drug therapy: Secondary | ICD-10-CM | POA: Insufficient documentation

## 2024-02-08 DIAGNOSIS — F17210 Nicotine dependence, cigarettes, uncomplicated: Secondary | ICD-10-CM | POA: Diagnosis not present

## 2024-02-08 DIAGNOSIS — Z8673 Personal history of transient ischemic attack (TIA), and cerebral infarction without residual deficits: Secondary | ICD-10-CM | POA: Insufficient documentation

## 2024-02-08 DIAGNOSIS — I10 Essential (primary) hypertension: Secondary | ICD-10-CM | POA: Diagnosis not present

## 2024-02-08 DIAGNOSIS — R21 Rash and other nonspecific skin eruption: Secondary | ICD-10-CM | POA: Insufficient documentation

## 2024-02-08 LAB — CBC
HCT: 40.9 % (ref 39.0–52.0)
Hemoglobin: 13 g/dL (ref 13.0–17.0)
MCH: 21.6 pg — ABNORMAL LOW (ref 26.0–34.0)
MCHC: 31.8 g/dL (ref 30.0–36.0)
MCV: 67.9 fL — ABNORMAL LOW (ref 80.0–100.0)
Platelets: 253 10*3/uL (ref 150–400)
RBC: 6.02 MIL/uL — ABNORMAL HIGH (ref 4.22–5.81)
RDW: 16 % — ABNORMAL HIGH (ref 11.5–15.5)
WBC: 9.7 10*3/uL (ref 4.0–10.5)
nRBC: 0.2 % (ref 0.0–0.2)

## 2024-02-08 LAB — BASIC METABOLIC PANEL
Anion gap: 6 (ref 5–15)
BUN: 16 mg/dL (ref 8–23)
CO2: 27 mmol/L (ref 22–32)
Calcium: 8.9 mg/dL (ref 8.9–10.3)
Chloride: 105 mmol/L (ref 98–111)
Creatinine, Ser: 0.97 mg/dL (ref 0.61–1.24)
GFR, Estimated: >60 mL/min (ref >60)
Glucose, Bld: 84 mg/dL (ref 70–99)
Potassium: 4.4 mmol/L (ref 3.5–5.1)
Sodium: 138 mmol/L (ref 135–145)

## 2024-02-08 NOTE — ED Triage Notes (Signed)
Pt c/o right leg pain that has been going on since 02/03/24. Pt states he has still been taking antibiotics with no improvement.

## 2024-02-09 MED ORDER — KETOCONAZOLE 2 % EX CREA
TOPICAL_CREAM | Freq: Two times a day (BID) | CUTANEOUS | Status: DC
  Filled 2024-02-09: qty 15

## 2024-02-09 NOTE — ED Provider Notes (Signed)
EMERGENCY DEPARTMENT AT [facility] Provider Note  CSN: [id number] Arrival date & time: 02/08/24 1927  Chief Complaint(s) Wound Check  HPI Gary Harrington is a 63 y.o. male with PMH chronic pain, COPD, HTN, CVA who presents emergency department for evaluation of right lower extremity rash/wound.  This is patient's third ER visit for this and was seen on 2/12, 2/15 and now today.  He is currently on clindamycin and not noticing significant improvement.  Denies new trauma to the leg.  Denies fever, chest pain, shortness of breath, abdominal pain, nausea, vomiting or other systemic symptoms.   Past Medical History Past Medical History:  Diagnosis Date   Anxiety    Chronic pain    COPD (chronic obstructive pulmonary disease) (HCC)    Depression    Dizziness    Dyspnea    Headache    Hypertension    Stroke [facility] 2000   Patient Active Problem List   Diagnosis Date Noted   Incarcerated left inguinal hernia 04/16/2023   Anxiety disorder 06/17/2020   Chest pain 06/17/2020   Essential hypertension 06/17/2020   Hyperlipidemia 06/17/2020   Syncope 06/17/2020   Tobacco abuse 06/17/2020   Home Medication(s) Prior to Admission medications   Medication Sig Start Date End Date Taking? Authorizing Provider  albuterol (VENTOLIN HFA) 108 (90 Base) MCG/ACT inhaler Inhale 2 puffs into the lungs every 6 (six) hours as needed for wheezing or shortness of breath.    [provider]  ALPRAZolam Prudy Feeler) 1 MG tablet Take 1 mg by mouth daily.    [provider]  clindamycin (CLEOCIN) 300 MG capsule Take 1 capsule (300 mg total) by mouth 3 (three) times daily for 7 days. 02/03/24 02/10/24  Coral Spikes, DO  fluticasone (FLONASE) 50 MCG/ACT nasal spray Place 2 sprays into both nostrils daily. 08/18/23   Mesner, Barbara Cower, MD  furosemide (LASIX) 20 MG tablet Take 1 tablet (20 mg total) by mouth daily for 7 days. 02/03/24 02/10/24  Coral Spikes, DO  ibuprofen (ADVIL)  800 MG tablet Take 1 tablet (800 mg total) by mouth every 8 (eight) hours as needed for moderate pain (pain score 4-6). 02/06/24   Bethann Berkshire, MD  oxyCODONE-acetaminophen (PERCOCET/ROXICET) 5-325 MG tablet Take 1 tablet by mouth every 6 (six) hours as needed for severe pain. 06/12/23   Carmel Sacramento A, PA-C  oxyCODONE-acetaminophen (PERCOCET/ROXICET) 5-325 MG tablet Take 1 tablet by mouth every 8 (eight) hours as needed for severe pain. 07/02/23   Schutt, Edsel Petrin, PA-C  pseudoephedrine (SUDAFED) 30 MG tablet Take 1 tablet (30 mg total) by mouth every 4 (four) hours as needed for congestion. 08/18/23   Mesner, Barbara Cower, MD                                                                                                                                    Past Surgical History Past Surgical History:  Procedure Laterality Date   TONSILLECTOMY  age 45   Family History Family History  Problem Relation Age of Onset   Stroke Father    Heart failure Father     Social History Social History   Tobacco Use   Smoking status: Every Day    Current packs/day: 3.00    Average packs/day: 3.0 packs/day for 27.0 years (81.0 ttl pk-yrs)    Types: Cigarettes   Smokeless tobacco: Never  Vaping Use   Vaping status: Never Used  Substance Use Topics   Alcohol use: No   Drug use: No   Allergies Codeine, Other, Penicillins, and Prednisone  Review of Systems Review of Systems  Skin:  Positive for rash.    Physical Exam Vital Signs  I have reviewed the triage vital signs BP 130/83   Pulse 62   Temp 98 F (36.7 C) (Oral)   Resp 20   Ht 5\' 9"  (1.753 m)   Wt 93 kg   SpO2 99%   BMI 30.27 kg/m   Physical Exam Constitutional:      General: He is not in acute distress.    Appearance: Normal appearance.  HENT:     Head: Normocephalic and atraumatic.     Nose: No congestion or rhinorrhea.  Eyes:     General:        Right eye: No discharge.        Left eye: No discharge.     Extraocular  Movements: Extraocular movements intact.     Pupils: Pupils are equal, round, and reactive to light.  Cardiovascular:     Rate and Rhythm: Normal rate and regular rhythm.     Heart sounds: No murmur heard. Pulmonary:     Effort: No respiratory distress.     Breath sounds: No wheezing or rales.  Abdominal:     General: There is no distension.     Tenderness: There is no abdominal tenderness.  Musculoskeletal:        General: Normal range of motion.     Cervical back: Normal range of motion.  Skin:    General: Skin is warm and dry.     Findings: Rash present.  Neurological:     General: No focal deficit present.     Mental Status: He is alert.     ED Results and Treatments Labs (all labs ordered are listed, but only abnormal results are displayed) Labs Reviewed  CBC - Abnormal; Notable for the following components:      Result Value   RBC 6.02 (*)    MCV 67.9 (*)    MCH 21.6 (*)    RDW 16.0 (*)    All other components within normal limits  BASIC METABOLIC PANEL                                                                                                                          Radiology No results found.  Pertinent labs & imaging results  that were available during my care of the patient were reviewed by me and considered in my medical decision making (see MDM for details).  Medications Ordered in ED Medications  ketoconazole (NIZORAL) 2 % cream ( Topical Given 02/09/24 0145)                                                                                                                                     Procedures Procedures  (including critical care time)  Medical Decision Making / ED Course   This patient presents to the ED for concern of rash, this involves an extensive number of treatment options, and is a complaint that carries with it a high risk of complications and morbidity.  The differential diagnosis includes cellulitis, fungal infection, tinea,  erysipelas, wound  MDM: Seen emergency room for evaluation of rash.  Physical exam with an erythematous lesion with erythematous satellite lesions over the right calf.  Do have concern for fungal etiology.  Patient will continue to take his clindamycin and we will start the patient on topical ketoconazole.  Dressing applied and patient discharged with outpatient follow-up.   Additional history obtained:  -External records from outside source obtained and reviewed including: Chart review including previous notes, labs, imaging, consultation notes   Lab Tests: -I ordered, reviewed, and interpreted labs.   The pertinent results include:   Labs Reviewed  CBC - Abnormal; Notable for the following components:      Result Value   RBC 6.02 (*)    MCV 67.9 (*)    MCH 21.6 (*)    RDW 16.0 (*)    All other components within normal limits  BASIC METABOLIC PANEL      Medicines ordered and prescription drug management: Meds ordered this encounter  Medications   ketoconazole (NIZORAL) 2 % cream    -I have reviewed the patients home medicines and have made adjustments as needed  Critical interventions none   Social Determinants of Health:  Factors impacting patients care include: none   Reevaluation: After the interventions noted above, I reevaluated the patient and found that they have :stayed the same  Co morbidities that complicate the patient evaluation  Past Medical History:  Diagnosis Date   Anxiety    Chronic pain    COPD (chronic obstructive pulmonary disease) (HCC)    Depression    Dizziness    Dyspnea    Headache    Hypertension    Stroke (HCC) 2000      Dispostion: I considered admission for this patient, but at this time he does not meet inpatient criteria for admission and will be discharged with outpatient follow-up     Final Clinical Impression(s) / ED Diagnoses Final diagnoses:  Rash     @PCDICTATION @    Glendora Score, MD 02/09/24 [id number]

## 2024-02-11 ENCOUNTER — Inpatient Hospital Stay (HOSPITAL_COMMUNITY)
Admission: EM | Admit: 2024-02-11 | Discharge: 2024-02-14 | DRG: 603 | Disposition: A | Discharge status: Home / Self Care | Payer: Medicaid Other | Attending: Family Medicine | Admitting: Family Medicine

## 2024-02-11 ENCOUNTER — Emergency Department (HOSPITAL_COMMUNITY): Payer: Medicaid Other

## 2024-02-11 ENCOUNTER — Other Ambulatory Visit: Payer: Self-pay

## 2024-02-11 ENCOUNTER — Encounter (HOSPITAL_COMMUNITY): Payer: Self-pay | Admitting: Emergency Medicine

## 2024-02-11 DIAGNOSIS — Z8249 Family history of ischemic heart disease and other diseases of the circulatory system: Secondary | ICD-10-CM | POA: Diagnosis not present

## 2024-02-11 DIAGNOSIS — Z8673 Personal history of transient ischemic attack (TIA), and cerebral infarction without residual deficits: Secondary | ICD-10-CM | POA: Diagnosis not present

## 2024-02-11 DIAGNOSIS — F17210 Nicotine dependence, cigarettes, uncomplicated: Secondary | ICD-10-CM | POA: Diagnosis present

## 2024-02-11 DIAGNOSIS — J449 Chronic obstructive pulmonary disease, unspecified: Secondary | ICD-10-CM | POA: Diagnosis present

## 2024-02-11 DIAGNOSIS — F32A Depression, unspecified: Secondary | ICD-10-CM | POA: Diagnosis present

## 2024-02-11 DIAGNOSIS — Z823 Family history of stroke: Secondary | ICD-10-CM

## 2024-02-11 DIAGNOSIS — E785 Hyperlipidemia, unspecified: Secondary | ICD-10-CM | POA: Diagnosis present

## 2024-02-11 DIAGNOSIS — F418 Other specified anxiety disorders: Secondary | ICD-10-CM | POA: Diagnosis not present

## 2024-02-11 DIAGNOSIS — I10 Essential (primary) hypertension: Secondary | ICD-10-CM | POA: Diagnosis present

## 2024-02-11 DIAGNOSIS — Z888 Allergy status to other drugs, medicaments and biological substances status: Secondary | ICD-10-CM | POA: Diagnosis not present

## 2024-02-11 DIAGNOSIS — G8929 Other chronic pain: Secondary | ICD-10-CM | POA: Diagnosis present

## 2024-02-11 DIAGNOSIS — L03115 Cellulitis of right lower limb: Principal | ICD-10-CM

## 2024-02-11 DIAGNOSIS — F419 Anxiety disorder, unspecified: Secondary | ICD-10-CM | POA: Diagnosis present

## 2024-02-11 DIAGNOSIS — Z885 Allergy status to narcotic agent status: Secondary | ICD-10-CM | POA: Diagnosis not present

## 2024-02-11 DIAGNOSIS — Z880 Allergy status to penicillin: Secondary | ICD-10-CM | POA: Diagnosis not present

## 2024-02-11 DIAGNOSIS — L0390 Cellulitis, unspecified: Secondary | ICD-10-CM | POA: Diagnosis present

## 2024-02-11 DIAGNOSIS — Z720 Tobacco use: Secondary | ICD-10-CM

## 2024-02-11 DIAGNOSIS — F411 Generalized anxiety disorder: Secondary | ICD-10-CM | POA: Diagnosis not present

## 2024-02-11 DIAGNOSIS — Z79899 Other long term (current) drug therapy: Secondary | ICD-10-CM | POA: Diagnosis not present

## 2024-02-11 DIAGNOSIS — E7849 Other hyperlipidemia: Secondary | ICD-10-CM | POA: Diagnosis not present

## 2024-02-11 LAB — BASIC METABOLIC PANEL
Anion gap: 6 (ref 5–15)
BUN: 16 mg/dL (ref 8–23)
CO2: 25 mmol/L (ref 22–32)
Calcium: 8.9 mg/dL (ref 8.9–10.3)
Chloride: 105 mmol/L (ref 98–111)
Creatinine, Ser: 0.88 mg/dL (ref 0.61–1.24)
GFR, Estimated: >60 mL/min (ref >60)
Glucose, Bld: 96 mg/dL (ref 70–99)
Potassium: 4.5 mmol/L (ref 3.5–5.1)
Sodium: 136 mmol/L (ref 135–145)

## 2024-02-11 LAB — CBC WITH DIFFERENTIAL/PLATELET
Abs Immature Granulocytes: 0.07 10*3/uL (ref 0.00–0.07)
Basophils Absolute: 0.1 10*3/uL (ref 0.0–0.1)
Basophils Relative: 1 %
Eosinophils Absolute: 0.8 10*3/uL — ABNORMAL HIGH (ref 0.0–0.5)
Eosinophils Relative: 8 %
HCT: 43.3 % (ref 39.0–52.0)
Hemoglobin: 13.8 g/dL (ref 13.0–17.0)
Immature Granulocytes: 1 %
Lymphocytes Relative: 17 %
Lymphs Abs: 1.6 10*3/uL (ref 0.7–4.0)
MCH: 21.5 pg — ABNORMAL LOW (ref 26.0–34.0)
MCHC: 31.9 g/dL (ref 30.0–36.0)
MCV: 67.4 fL — ABNORMAL LOW (ref 80.0–100.0)
Monocytes Absolute: 0.6 10*3/uL (ref 0.1–1.0)
Monocytes Relative: 7 %
Neutro Abs: 6.3 10*3/uL (ref 1.7–7.7)
Neutrophils Relative %: 66 %
Platelets: 256 10*3/uL (ref 150–400)
RBC: 6.42 MIL/uL — ABNORMAL HIGH (ref 4.22–5.81)
RDW: 17.2 % — ABNORMAL HIGH (ref 11.5–15.5)
Smear Review: ADEQUATE
WBC: 9.3 10*3/uL (ref 4.0–10.5)
nRBC: 0 % (ref 0.0–0.2)

## 2024-02-11 LAB — LACTIC ACID, PLASMA: Lactic Acid, Venous: 1.3 mmol/L (ref 0.5–1.9)

## 2024-02-11 MED ORDER — HYDROCORTISONE 1 % EX OINT
TOPICAL_OINTMENT | Freq: Three times a day (TID) | CUTANEOUS | Status: DC
  Filled 2024-02-11: qty 28.35

## 2024-02-11 MED ORDER — ACETAMINOPHEN 650 MG RE SUPP: 650.0000 mg | Freq: Four times a day (QID) | RECTAL | Status: DC | PRN

## 2024-02-11 MED ORDER — NICOTINE 21 MG/24HR TD PT24
21.0000 mg | MEDICATED_PATCH | Freq: Every day | TRANSDERMAL | Status: DC
  Administered 2024-02-11 – 2024-02-14 (×4): 21 mg via TRANSDERMAL
  Filled 2024-02-11 (×4): qty 1

## 2024-02-11 MED ORDER — SODIUM CHLORIDE 0.9 % IV SOLN: INTRAVENOUS | Status: AC | PRN

## 2024-02-11 MED ORDER — ONDANSETRON HCL 4 MG/2ML IJ SOLN
4.0000 mg | Freq: Once | INTRAMUSCULAR | Status: AC
  Administered 2024-02-11: 4 mg via INTRAVENOUS
  Filled 2024-02-11: qty 2

## 2024-02-11 MED ORDER — VANCOMYCIN HCL 2000 MG/400ML IV SOLN
2000.0000 mg | Freq: Once | INTRAVENOUS | Status: AC
  Administered 2024-02-11: 2000 mg via INTRAVENOUS
  Filled 2024-02-11: qty 400

## 2024-02-11 MED ORDER — SODIUM CHLORIDE 0.9% FLUSH
3.0000 mL | Freq: Two times a day (BID) | INTRAVENOUS | Status: DC
  Administered 2024-02-11 – 2024-02-14 (×6): 3 mL via INTRAVENOUS

## 2024-02-11 MED ORDER — MORPHINE SULFATE (PF) 4 MG/ML IV SOLN
4.0000 mg | Freq: Once | INTRAVENOUS | Status: AC
  Administered 2024-02-11: 4 mg via INTRAVENOUS
  Filled 2024-02-11: qty 1

## 2024-02-11 MED ORDER — POLYETHYLENE GLYCOL 3350 17 G PO PACK: 17.0000 g | PACK | Freq: Every day | ORAL | Status: DC | PRN

## 2024-02-11 MED ORDER — TRAMADOL HCL 50 MG PO TABS
50.0000 mg | ORAL_TABLET | Freq: Three times a day (TID) | ORAL | Status: DC | PRN
  Administered 2024-02-11 – 2024-02-13 (×6): 50 mg via ORAL
  Filled 2024-02-11 (×6): qty 1

## 2024-02-11 MED ORDER — ALPRAZOLAM 1 MG PO TABS
1.0000 mg | ORAL_TABLET | Freq: Every day | ORAL | Status: DC | PRN
  Administered 2024-02-11 – 2024-02-14 (×4): 1 mg via ORAL
  Filled 2024-02-11: qty 1
  Filled 2024-02-11: qty 2
  Filled 2024-02-11 (×2): qty 1

## 2024-02-11 MED ORDER — OXYCODONE-ACETAMINOPHEN 5-325 MG PO TABS
1.0000 | ORAL_TABLET | Freq: Once | ORAL | Status: AC
  Administered 2024-02-11: 1 via ORAL
  Filled 2024-02-11: qty 1

## 2024-02-11 MED ORDER — VANCOMYCIN HCL IN DEXTROSE 1-5 GM/200ML-% IV SOLN
1000.0000 mg | Freq: Once | INTRAVENOUS | Status: DC
Start: 2024-02-11 — End: 2024-02-11

## 2024-02-11 MED ORDER — IBUPROFEN 400 MG PO TABS
400.0000 mg | ORAL_TABLET | Freq: Three times a day (TID) | ORAL | Status: DC | PRN
  Administered 2024-02-13: 400 mg via ORAL
  Filled 2024-02-11: qty 1

## 2024-02-11 MED ORDER — ACETAMINOPHEN 325 MG PO TABS
650.0000 mg | ORAL_TABLET | Freq: Four times a day (QID) | ORAL | Status: DC | PRN
  Administered 2024-02-14: 650 mg via ORAL
  Filled 2024-02-11: qty 2

## 2024-02-11 MED ORDER — ONDANSETRON HCL 4 MG PO TABS: 4.0000 mg | ORAL_TABLET | Freq: Four times a day (QID) | ORAL | Status: DC | PRN

## 2024-02-11 MED ORDER — BISACODYL 10 MG RE SUPP: 10.0000 mg | Freq: Every day | RECTAL | Status: DC | PRN

## 2024-02-11 MED ORDER — ALBUTEROL SULFATE (2.5 MG/3ML) 0.083% IN NEBU
2.5000 mg | INHALATION_SOLUTION | RESPIRATORY_TRACT | Status: DC | PRN
  Administered 2024-02-11: 2.5 mg via RESPIRATORY_TRACT
  Filled 2024-02-11: qty 3

## 2024-02-11 MED ORDER — SODIUM CHLORIDE 0.9% FLUSH: 3.0000 mL | INTRAVENOUS | Status: DC | PRN

## 2024-02-11 MED ORDER — VANCOMYCIN HCL 750 MG/150ML IV SOLN
750.0000 mg | Freq: Two times a day (BID) | INTRAVENOUS | Status: DC
  Administered 2024-02-11 – 2024-02-14 (×6): 750 mg via INTRAVENOUS
  Filled 2024-02-11 (×8): qty 150

## 2024-02-11 MED ORDER — SERTRALINE HCL 50 MG PO TABS
25.0000 mg | ORAL_TABLET | Freq: Every day | ORAL | Status: DC
  Administered 2024-02-11 – 2024-02-14 (×4): 25 mg via ORAL
  Filled 2024-02-11 (×4): qty 1

## 2024-02-11 MED ORDER — HEPARIN SODIUM (PORCINE) 5000 UNIT/ML IJ SOLN
5000.0000 [IU] | Freq: Three times a day (TID) | INTRAMUSCULAR | Status: DC
  Administered 2024-02-11 – 2024-02-12 (×4): 5000 [IU] via SUBCUTANEOUS
  Filled 2024-02-11 (×7): qty 1

## 2024-02-11 MED ORDER — VANCOMYCIN HCL IN DEXTROSE 1-5 GM/200ML-% IV SOLN: 1000.0000 mg | Freq: Once | INTRAVENOUS | Status: DC

## 2024-02-11 MED ORDER — IOHEXOL 300 MG/ML  SOLN
100.0000 mL | Freq: Once | INTRAMUSCULAR | Status: AC | PRN
  Administered 2024-02-11: 100 mL via INTRAVENOUS

## 2024-02-11 MED ORDER — ONDANSETRON HCL 4 MG/2ML IJ SOLN: 4.0000 mg | Freq: Four times a day (QID) | INTRAMUSCULAR | Status: DC | PRN

## 2024-02-11 NOTE — ED Provider Notes (Signed)
[place] EMERGENCY DEPARTMENT AT [facility] Provider Note   CSN: [id number] Arrival date & time: 02/11/24  6213     History  Chief Complaint  Patient presents with   Leg Pain    Gary Harrington is a 63 y.o. male.  Patient is a 63 year old male who presents to the emergency department with a chief complaint of pain, swelling and erythema over the right lower extremity.  He notes that this has been ongoing since scraping his leg approximately 2 weeks ago.  He has been evaluated in the emergency department 4 times at this point and has been on clindamycin as well as an antifungal cream.  He notes that he has been compliant with these medications and has completed his clindamycin.  Patient currently admits to subjective fevers at home.  He has had no associated chest pain, shortness of breath, abdominal pain, nausea, vomiting, diarrhea.  He is currently on Lasix and has been compliant with this medication.  He notes that the pain has been severe to the right lower extremity.   Leg Pain      Home Medications Prior to Admission medications   Medication Sig Start Date End Date Taking? Authorizing Provider  albuterol (VENTOLIN HFA) 108 (90 Base) MCG/ACT inhaler Inhale 2 puffs into the lungs every 6 (six) hours as needed for wheezing or shortness of breath.    [provider]  ALPRAZolam Prudy Feeler) 1 MG tablet Take 1 mg by mouth daily.    [provider]  fluticasone (FLONASE) 50 MCG/ACT nasal spray Place 2 sprays into both nostrils daily. 08/18/23   Mesner, Barbara Cower, MD  furosemide (LASIX) 20 MG tablet Take 1 tablet (20 mg total) by mouth daily for 7 days. 02/03/24 02/10/24  Coral Spikes, DO  ibuprofen (ADVIL) 800 MG tablet Take 1 tablet (800 mg total) by mouth every 8 (eight) hours as needed for moderate pain (pain score 4-6). 02/06/24   Bethann Berkshire, MD  oxyCODONE-acetaminophen (PERCOCET/ROXICET) 5-325 MG tablet Take 1 tablet by mouth every 6 (six) hours as  needed for severe pain. 06/12/23   Carmel Sacramento A, PA-C  oxyCODONE-acetaminophen (PERCOCET/ROXICET) 5-325 MG tablet Take 1 tablet by mouth every 8 (eight) hours as needed for severe pain. 07/02/23   Schutt, Edsel Petrin, PA-C  pseudoephedrine (SUDAFED) 30 MG tablet Take 1 tablet (30 mg total) by mouth every 4 (four) hours as needed for congestion. 08/18/23   Mesner, Barbara Cower, MD      Allergies    Codeine, Other, Penicillins, and Prednisone    Review of Systems   Review of Systems  Skin:  Positive for wound.  All other systems reviewed and are negative.   Physical Exam Updated Vital Signs BP 117/79 (BP Location: Right Arm)   Pulse 72   Temp 97.8 F (36.6 C) (Oral)   Resp 19   Ht 5\' 9"  (1.753 m)   Wt 95.3 kg   SpO2 100%   BMI 31.01 kg/m  Physical Exam Vitals reviewed.  Constitutional:      Appearance: Normal appearance.  HENT:     Head: Normocephalic and atraumatic.     Nose: Nose normal.     Mouth/Throat:     Mouth: Mucous membranes are moist.  Eyes:     Extraocular Movements: Extraocular movements intact.     Conjunctiva/sclera: Conjunctivae normal.     Pupils: Pupils are equal, round, and reactive to light.  Cardiovascular:     Rate and Rhythm: Normal rate and regular rhythm.  Pulses: Normal pulses.     Heart sounds: Normal heart sounds.  Pulmonary:     Effort: Pulmonary effort is normal.     Breath sounds: Normal breath sounds.  Abdominal:     General: Abdomen is flat. Bowel sounds are normal.     Palpations: Abdomen is soft.  Musculoskeletal:        General: Swelling and tenderness present. No deformity or signs of injury. Normal range of motion.     Cervical back: Normal range of motion and neck supple.     Right lower leg: Edema present.     Comments: DP and PT pulses are 2+ distally bilateral lower extremities  Skin:    General: Skin is warm and dry.     Comments: Erythematous area noted over the medial aspect of the right lower extremity, no areas of  fluctuance, no purulent discharge or bleeding, no lymphatic streaking  Neurological:     General: No focal deficit present.     Mental Status: He is alert and oriented to person, place, and time. Mental status is at baseline.  Psychiatric:        Mood and Affect: Mood normal.        Behavior: Behavior normal.        Thought Content: Thought content normal.        Judgment: Judgment normal.     ED Results / Procedures / Treatments   Labs (all labs ordered are listed, but only abnormal results are displayed) Labs Reviewed  BASIC METABOLIC PANEL  CBC WITH DIFFERENTIAL/PLATELET    EKG None  Radiology No results found.  Procedures Procedures    Medications Ordered in ED Medications  oxyCODONE-acetaminophen (PERCOCET/ROXICET) 5-325 MG per tablet 1 tablet (1 tablet Oral Given 02/11/24 0914)    ED Course/ Medical Decision Making/ A&P                                 Medical Decision Making This patient presents to the ED for concern of worsening cellulitis to right lower extremity, this involves an extensive number of treatment options, and is a complaint that carries with it a high risk of complications and morbidity.  The differential diagnosis includes cellulitis, abscess remission, necrotizing fasciitis, acute arterial insufficiency, DVT   Co morbidities that complicate the patient evaluation  Chronic pain   Additional history obtained:  Additional history obtained from medical records External records from outside source obtained and reviewed including none   Lab Tests:  I Ordered, and personally interpreted labs.  The pertinent results include: Normal lactate and white blood cell count   Imaging Studies ordered:  I ordered imaging studies including CT scan of right lower extremity I independently visualized and interpreted imaging which showed lytic changes to right lower extremity with no signs of deep space abscess or gas I agree with the radiologist  interpretation     Consultations Obtained:  I requested consultation with the hospitalist,  and discussed lab and imaging findings as well as pertinent plan - they recommend: Admission   Problem List / ED Course / Critical interventions / Medication management  Patient does remain stable at this time.  Will plan for admission to the hospital service given the patient's worsening cellulitis to his right lower extremity and currently failing outpatient treatment.  Patient has CT scan which demonstrates no indication for necrotizing fasciitis or deep space abscess.  He does not actively meet  sepsis criteria at this point.  He has been given dose of vancomycin in the emergency department as well as treatment for his pain.  Have discussed patient case with Dr. Otila Back who has excepted at this time.  Patient was fully evaluated by attending physician who is in agreement to plan at this time.   I ordered medication including vancomycin for cellulitis Reevaluation of the patient after these medicines showed that the patient improved I have reviewed the patients home medicines and have made adjustments as needed   Social Determinants of Health:  None   Test / Admission - Considered:  Admission    Amount and/or Complexity of Data Reviewed Labs: ordered. Radiology: ordered.  Risk Prescription drug management. Decision regarding hospitalization.           Final Clinical Impression(s) / ED Diagnoses Final diagnoses:  None    Rx / DC Orders ED Discharge Orders     None         Lelon Perla, PA-C 02/11/24 1342    Pricilla Loveless, MD 02/12/24 [phone]

## 2024-02-11 NOTE — Progress Notes (Signed)
Pharmacy Antibiotic Note  Gary Harrington is a 63 y.o. male admitted on 02/11/2024 with cellulitis.  Pharmacy has been consulted for vancomycin dosing.  Vancomycin 2g x2 then 750mg  IV Q 12 hrs. Goal AUC 400-550. Expected AUC: 422 SCr used: 0.88  Height: 5\' 9"  (175.3 cm) Weight: 95.3 kg (210 lb) IBW/kg (Calculated) : 70.7  Temp (24hrs), Avg:97.8 F (36.6 C), Min:97.8 F (36.6 C), Max:97.8 F (36.6 C)  Recent Labs  Lab 02/08/24 2015 02/11/24 0944 02/11/24 1055  WBC 9.7 9.3  --   CREATININE 0.97 0.88  --   LATICACIDVEN  --   --  1.3    Estimated Creatinine Clearance: 96.6 mL/min (by C-G formula based on SCr of 0.88 mg/dL).    Allergies  Allergen Reactions   Codeine Hives and Itching   Other Itching    All Steroids   Penicillins Hives    immediate rash, facial/tongue/throat swelling, SOB or lightheadedness with hypotension   Prednisone Hives    Thank you for allowing pharmacy to be a part of this patient's care.  Sheppard Coil PharmD., BCPS Clinical Pharmacist 02/11/2024 4:49 PM

## 2024-02-11 NOTE — H&P (Signed)
History and Physical    Patient: Gary Harrington WUJ:[id number] DOB: Apr 06, 1961 DOA: 02/11/2024 DOS: the patient was seen and examined on 02/11/2024 PCP: Veverly Fells, MD  Patient coming from: Home  Chief Complaint:  Chief Complaint  Patient presents with   Leg Pain   HPI: Gary Harrington is a 63 y.o. male with medical history significant of tobacco abuse/COPD, anxiety/depression, and HTN and questionable history of prior stroke/TIA back in 2000... Presents to the ED with right leg erythema, warmth and discomfort--over the last 3 weeks or so -Pt Reports 3 weeks ago he scraped his leg on a car and had an abrasion. He has been cleaning the wound almost daily, but still is not healing.--He was seen in the ED on 02/03/2024 and prescribed clindamycin, seen again in the ED on 02/06/2024, and again the ED on 02/09/2024 and topical ketoconazole was added to his clindamycin antibiotic-- -No fever  Or chills  No Nausea, Vomiting or Diarrhea -right leg x-rays from 02/03/2024 was consistent with cellulitis without fracture or foreign body -Right lower extremity venous Doppler from 02/03/2024 without acute DVT -CT tibia-fibula of the right lower extremity on 02/11/2024 shows no acute bony abnormalities, findings are consistent with cellulitis without loculated fluid or soft tissue gas -CBC without leukocytosis, anemia or thrombocytopenia -BMP and lactic acid unremarkable  -- Patient will be admitted to the hospital for IV antibiotics after failing outpatient management  Review of Systems: As mentioned in the history of present illness. All other systems reviewed and are negative. Past Medical History:  Diagnosis Date   Anxiety    Chronic pain    COPD (chronic obstructive pulmonary disease) (HCC)    Depression    Dizziness    Dyspnea    Headache    Hypertension    Stroke (HCC) 2000   Past Surgical History:  Procedure Laterality Date   TONSILLECTOMY  age 64   Social History:  reports that he  has been smoking cigarettes. He has a 81 pack-year smoking history. He has never used smokeless tobacco. He reports that he does not drink alcohol and does not use drugs.  Allergies  Allergen Reactions   Codeine Hives and Itching   Other Itching    All Steroids   Penicillins Hives    immediate rash, facial/tongue/throat swelling, SOB or lightheadedness with hypotension   Prednisone Hives    Family History  Problem Relation Age of Onset   Stroke Father    Heart failure Father     Prior to Admission medications   Medication Sig Start Date End Date Taking? Authorizing Provider  albuterol (VENTOLIN HFA) 108 (90 Base) MCG/ACT inhaler Inhale 2 puffs into the lungs every 6 (six) hours as needed for wheezing or shortness of breath.   Yes [provider]  ALPRAZolam Prudy Feeler) 1 MG tablet Take 1 mg by mouth daily.   Yes [provider]  furosemide (LASIX) 20 MG tablet Take 1 tablet (20 mg total) by mouth daily for 7 days. 02/03/24 02/11/24 Yes Coral Spikes, DO  ibuprofen (ADVIL) 800 MG tablet Take 1 tablet (800 mg total) by mouth every 8 (eight) hours as needed for moderate pain (pain score 4-6). 02/06/24  Yes Bethann Berkshire, MD  fluticasone (FLONASE) 50 MCG/ACT nasal spray Place 2 sprays into both nostrils daily. Patient not taking: Reported on 02/11/2024 08/18/23   Marily Memos, MD    Physical Exam: Vitals:   02/11/24 0808 02/11/24 0809 02/11/24 1744  BP: 117/79  117/79  Pulse: 72  76  Resp: 19  18  Temp: 97.8 F (36.6 C)  97.8 F (36.6 C)  TempSrc: Oral  Oral  SpO2: 100%  100%  Weight:  95.3 kg   Height:  5\' 9"  (1.753 m)     Physical Exam  Gen:- Awake Alert, in no acute distress  HEENT:- [place].AT, No sclera icterus Neck-Supple Neck,No JVD,.  Lungs-  CTAB , fair air movement bilaterally  CV- S1, S2 normal, RRR Abd-  +ve B.Sounds, Abd Soft, No tenderness,    Extremity-  good pedal pulses  Psych-affect is appropriate, oriented x3 Neuro-no new focal deficits, no  tremors MSK-- Rt Leg    Media Information  Document Information  Photos    02/11/2024 16:27  Attached To:  Hospital Encounter on 02/11/24  Source Information  Shon Hale, MD  [facility]  Document History      Media Information  Document Information  Photos    02/11/2024 16:27  Attached To:  Hospital Encounter on 02/11/24  Source Information  Shon Hale, MD  [facility]  Document History     Data Reviewed: -right leg x-rays from 02/03/2024 was consistent with cellulitis without fracture or foreign body -Right lower extremity venous Doppler from 02/03/2024 without acute DVT -CT tibia-fibula of the right lower extremity on 02/11/2024 shows no acute bony abnormalities, findings are consistent with cellulitis without loculated fluid or soft tissue gas -CBC without leukocytosis, anemia or thrombocytopenia -BMP and lactic acid unremarkable  Assessment and Plan: 1)Rt Leg cellulitis failed outpatient clindamycin--- IV vancomycin as ordered -Pain control -Topical hydrocortisone cream requested  2)Depression/anxiety--Zoloft 25 mg daily, Xanax as needed  3) tobacco abuse/COPD--no COPD flareup -As needed albuterol -Nicotine patch   Advance Care Planning:   Code Status: Full Code   Consults: wound consult  Family Communication: --None at bedside  Severity of Illness: The appropriate patient status for this patient is INPATIENT. Inpatient status is judged to be reasonable and necessary in order to provide the required intensity of service to ensure the patient's safety. The patient's presenting symptoms, physical exam findings, and initial radiographic and laboratory data in the context of their chronic comorbidities is felt to place them at high risk for further clinical deterioration. Furthermore, it is not anticipated that the patient will be medically stable for discharge from the hospital within 2 midnights of admission.   * I certify that at  the point of admission it is my clinical judgment that the patient will require inpatient hospital care spanning beyond 2 midnights from the point of admission due to high intensity of service, high risk for further deterioration and high frequency of surveillance required.*  Author: Shon Hale, MD 02/11/2024 6:14 PM  For on call review [email or web address].

## 2024-02-11 NOTE — ED Triage Notes (Signed)
Pt reports pain to the right leg. Leg was recently injured and treated with antibiotics. Pt reports pain is unbearable and caused hip to stay up all night.

## 2024-02-11 NOTE — ED Notes (Signed)
Patient c/o anxiety and restless. Dimmed lights,  repositioned in bed. Patient requesting xanax

## 2024-02-12 DIAGNOSIS — F419 Anxiety disorder, unspecified: Secondary | ICD-10-CM

## 2024-02-12 DIAGNOSIS — L03115 Cellulitis of right lower limb: Secondary | ICD-10-CM | POA: Diagnosis not present

## 2024-02-12 DIAGNOSIS — Z720 Tobacco use: Secondary | ICD-10-CM | POA: Diagnosis not present

## 2024-02-12 LAB — BASIC METABOLIC PANEL
Anion gap: 9 (ref 5–15)
BUN: 15 mg/dL (ref 8–23)
CO2: 24 mmol/L (ref 22–32)
Calcium: 8.4 mg/dL — ABNORMAL LOW (ref 8.9–10.3)
Chloride: 103 mmol/L (ref 98–111)
Creatinine, Ser: 1.02 mg/dL (ref 0.61–1.24)
GFR, Estimated: >60 mL/min (ref >60)
Glucose, Bld: 91 mg/dL (ref 70–99)
Potassium: 4.3 mmol/L (ref 3.5–5.1)
Sodium: 136 mmol/L (ref 135–145)

## 2024-02-12 LAB — HIV ANTIBODY (ROUTINE TESTING W REFLEX): HIV Screen 4th Generation wRfx: NONREACTIVE

## 2024-02-12 MED ORDER — SILVER SULFADIAZINE 1 % EX CREA
TOPICAL_CREAM | Freq: Two times a day (BID) | CUTANEOUS | Status: DC
Start: 2024-02-12 — End: 2024-02-14
  Filled 2024-02-12: qty 85

## 2024-02-12 NOTE — Progress Notes (Addendum)
PROGRESS NOTE     Gary Harrington, is a 63 y.o. male, DOB - 25-Dec-1960, EAV:[id number]  Admit date - 02/11/2024   Admitting Physician Shon Hale, MD  Outpatient Primary MD for the patient is Veverly Fells, MD  LOS - 1  Chief Complaint  Patient presents with   Leg Pain        Brief Narrative:  63 y.o. male with medical history significant of tobacco abuse/COPD, anxiety/depression, and HTN and questionable history of prior stroke/TIA back in 2000--admitted on 02/11/2024 with right leg cellulitis after trauma but 3 weeks prior, previously treated as outpatient clindamycin appears to have failed outpatient antibiotic    -Assessment and Plan: 1)Rt Leg cellulitis ----failed outpatient clindamycin--- -continue IV vancomycin as ordered -Pain control -Topical hydrocortisone cream  -Wound care consult appreciated   2)Depression/Anxiety--c/n Zoloft 25 mg daily, Xanax as needed   3)COPD--no COPD flareup -As needed albuterol  4)Tobacco Abuse--  Smoking cessation counseling for 4 minutes today, -Use nicotine patch I have discussed tobacco cessation with the patient.  I have counseled the patient regarding the negative impacts of continued tobacco use including but not limited to lung cancer, COPD, and cardiovascular disease.  I have discussed alternatives to tobacco and modalities that may help facilitate tobacco cessation including but not limited to biofeedback, hypnosis, and medications.  Total time spent with tobacco counseling was 4 minutes.  Status is: Inpatient   Disposition: The patient is from: Home              Anticipated d/c is to: Home              Anticipated d/c date is: 1 day              Patient currently is not medically stable to d/c. Barriers: Not Clinically Stable-   Code Status :  -  Code Status: Full Code   Family Communication:   NA (patient is alert, awake and coherent)   DVT Prophylaxis  :   - SCDs   heparin injection 5,000 Units Start: 02/11/24  2200 SCDs Start: 02/11/24 1619 Place TED hose Start: 02/11/24 1619   Lab Results  Component Value Date   PLT 256 02/11/2024   Inpatient Medications  Scheduled Meds:  heparin  5,000 Units Subcutaneous Q8H   hydrocortisone   Topical TID   nicotine  21 mg Transdermal Daily   sertraline  25 mg Oral Daily   sodium chloride flush  3 mL Intravenous Q12H   sodium chloride flush  3 mL Intravenous Q12H   Continuous Infusions:  sodium chloride     vancomycin 750 mg (02/12/24 0938)   PRN Meds:.sodium chloride, acetaminophen **OR** acetaminophen, albuterol, ALPRAZolam, bisacodyl, ibuprofen, ondansetron **OR** ondansetron (ZOFRAN) IV, polyethylene glycol, sodium chloride flush, traMADol   Anti-infectives (From admission, onward)    Start     Dose/Rate Route Frequency Ordered Stop   02/11/24 2200  vancomycin (VANCOREADY) IVPB 750 mg/150 mL        750 mg 150 mL/hr over 60 Minutes Intravenous Every 12 hours 02/11/24 1649     02/11/24 1645  vancomycin (VANCOCIN) IVPB 1000 mg/200 mL premix  Status:  Discontinued        1,000 mg 200 mL/hr over 60 Minutes Intravenous  Once 02/11/24 1633 02/11/24 1649   02/11/24 1030  vancomycin (VANCOCIN) IVPB 1000 mg/200 mL premix  Status:  Discontinued        1,000 mg 200 mL/hr over 60 Minutes Intravenous  Once 02/11/24 1016 02/11/24  1024   02/11/24 1030  vancomycin (VANCOREADY) IVPB 2000 mg/400 mL        2,000 mg 200 mL/hr over 120 Minutes Intravenous  Once 02/11/24 1024 02/11/24 1358        Subjective: Gary Harrington today has no fevers, no emesis,  No chest pain,   -Per patient right leg discomfort is not worse -Ambulatory in the room  Objective: Vitals:   02/11/24 2015 02/12/24 0005 02/12/24 0314 02/12/24 0508  BP: 122/73 (!) 98/59 (!) 93/57 (!) 103/56  Pulse: 80 68 77   Resp: 20 14 20    Temp: 97.8 F (36.6 C) (!) 97.4 F (36.3 C) 98.5 F (36.9 C)   TempSrc:      SpO2: 100% 96% 99%   Weight: 103.3 kg     Height:        Intake/Output  Summary (Last 24 hours) at 02/12/2024 1253 Last data filed at 02/12/2024 4540 Gross per 24 hour  Intake 630 ml  Output --  Net 630 ml   Filed Weights   02/11/24 0809 02/11/24 2015  Weight: 95.3 kg 103.3 kg    Physical Exam Gen:- Awake Alert,  in no apparent distress  HEENT:- [place].AT, No sclera icterus Neck-Supple Neck,No JVD,.  Lungs-  CTAB , fair symmetrical air movement CV- S1, S2 normal, regular  Abd-  +ve B.Sounds, Abd Soft, No tenderness,    Psych-affect is appropriate, oriented x3 Neuro-no new focal deficits, no tremors Extremity/Skin:-  pedal pulses present , --right leg erythema, warmth swelling and discomfort consistent with cellulitis- please see photos in epic  Data Reviewed: I have personally reviewed following labs and imaging studies  CBC: Recent Labs  Lab 02/08/24 2015 02/11/24 0944  WBC 9.7 9.3  NEUTROABS  --  6.3  HGB 13.0 13.8  HCT 40.9 43.3  MCV 67.9* 67.4*  PLT 253 256   Basic Metabolic Panel: Recent Labs  Lab 02/08/24 2015 02/11/24 0944 02/12/24 0357  NA 138 136 136  K 4.4 4.5 4.3  CL 105 105 103  CO2 27 25 24   GLUCOSE 84 96 91  BUN 16 16 15   CREATININE 0.97 0.88 1.02  CALCIUM 8.9 8.9 8.4*   GFR: Estimated Creatinine Clearance: 86.6 mL/min (by C-G formula based on SCr of 1.02 mg/dL).  Recent Results (from the past 240 hours)  Blood culture (routine x 2)     Status: None (Preliminary result)   Collection Time: 02/11/24 10:45 AM   Specimen: BLOOD  Result Value Ref Range Status   Specimen Description BLOOD LEFT ANTECUBITAL  Final   Special Requests   Final    BOTTLES DRAWN AEROBIC AND ANAEROBIC Blood Culture adequate volume   Culture   Final    NO GROWTH < 24 HOURS Performed at [facility], [address], [place], [place] [address]    Report Status PENDING  Incomplete  Blood culture (routine x 2)     Status: None (Preliminary result)   Collection Time: 02/11/24 10:54 AM   Specimen: BLOOD  Result Value Ref Range Status   Specimen  Description BLOOD RIGHT ANTECUBITAL  Final   Special Requests   Final    BOTTLES DRAWN AEROBIC AND ANAEROBIC Blood Culture adequate volume   Culture   Final    NO GROWTH < 24 HOURS Performed at [facility], [address] [place], [place] [address]    Report Status PENDING  Incomplete    Radiology Studies: CT TIBIA FIBULA RIGHT W CONTRAST Result Date: 02/11/2024 CLINICAL DATA:  Soft tissue infection suspected, right lower leg. EXAM: CT OF THE LOWER RIGHT EXTREMITY WITH CONTRAST TECHNIQUE: Multidetector CT imaging of the lower right extremity was performed according to the standard protocol following intravenous contrast administration. RADIATION DOSE REDUCTION: This exam was performed according to the departmental dose-optimization program which includes automated exposure control, adjustment of the mA and/or kV according to patient size and/or use of iterative reconstruction technique. CONTRAST:  OMNIPAQUE IOHEXOL 300 MG/ML  SOLN COMPARISON:  Right tibia and fibula radiographs dated 02/03/2024. FINDINGS: Bones/Joint/Cartilage No acute fracture or dislocation. Normal alignment. No joint effusion. Mild medial femorotibial compartment joint space narrowing. Calcaneal enthesopathy at the insertion of the Achilles tendon and the origin of the central cord of the plantar fascia. Mild degenerative changes of the first MTP joint. Ligaments Ligaments are suboptimally evaluated by CT. Muscles and Tendons No intramuscular fluid collection or hematoma. A 3.0 x 1.2 x 0.7 cm fat density focus within the lateral gastrocnemius muscle may represent an intramuscular lipoma. No discrete soft tissue nodular component. Soft tissue Diffuse circumferential subcutaneous edema and infiltration extending from the knee through the distal right leg to the level of the ankle. No loculated fluid collection identified. No soft tissue gas. There is overlying cutaneous thickening. IMPRESSION: 1. Diffuse circumferential  subcutaneous edema extending from the right knee through the distal leg to the level of the ankle with overlying cutaneous thickening, concerning for cellulitis. No loculated fluid collection or soft tissue gas identified. 2. No acute osseous abnormality. 3. A 3 cm fat density lesion within the lateral gastrocnemius muscle may represent an intramuscular lipoma. No discrete soft tissue nodular component. Electronically Signed   By: Hart Robinsons M.D.   On: 02/11/2024 12:50   Scheduled Meds:  heparin  5,000 Units Subcutaneous Q8H   hydrocortisone   Topical TID   nicotine  21 mg Transdermal Daily   sertraline  25 mg Oral Daily   sodium chloride flush  3 mL Intravenous Q12H   sodium chloride flush  3 mL Intravenous Q12H   Continuous Infusions:  sodium chloride     vancomycin 750 mg (02/12/24 0938)    LOS: 1 day   Shon Hale M.D on 02/12/2024 at 12:53 PM  Go to [email or web address] - for contact info  [facility] - Office  [phone]  If 7PM-7AM, please contact night-coverage [email or web address] 02/12/2024, 12:53 PM

## 2024-02-12 NOTE — Progress Notes (Signed)
Transition of Care Department [facility]) has reviewed patient and no other TOC needs have been identified at this time. We will continue to monitor patient advancement through interdisciplinary progression rounds. If new patient transition needs arise, please place a TOC consult.   02/12/24 0755  TOC Brief Assessment  Insurance and Status Reviewed  Patient has primary care physician Yes  Home environment has been reviewed Lives with wife and sons.  Prior level of function: Independent.  Prior/Current Home Services No current home services  Social Drivers of Health Review SDOH reviewed no interventions necessary  Readmission risk has been reviewed Yes  Transition of care needs no transition of care needs at this time

## 2024-02-12 NOTE — Plan of Care (Signed)

## 2024-02-12 NOTE — Consult Note (Signed)
WOC Nurse Consult Note: Reason for Consult:Trauma wound to right medial lower leg.  Failed outpatient management.  Wound type:infectious, trauma Pressure Injury POA:NA Measurement: photo in chart right medial lower leg, erythematous, weeping tender to touch Wound bed: red, moist and tender Drainage (amount, consistency, odor) minimal serosanguinous  no odor Periwound: intact, warmth Dressing procedure/placement/frequency: Cleanse right lower leg with NS and pat dry,  Apply silvadene cream twice daily.  Cover with dry gauze and kerlix.  Will not follow at this time.  Please re-consult if needed.  Mike Gip MSN, RN, FNP-BC CWON Wound, Ostomy, Continence Nurse [facility] [phone] Pager [phone]

## 2024-02-13 DIAGNOSIS — E7849 Other hyperlipidemia: Secondary | ICD-10-CM

## 2024-02-13 DIAGNOSIS — L03115 Cellulitis of right lower limb: Secondary | ICD-10-CM | POA: Diagnosis not present

## 2024-02-13 DIAGNOSIS — Z720 Tobacco use: Secondary | ICD-10-CM | POA: Diagnosis not present

## 2024-02-13 DIAGNOSIS — I10 Essential (primary) hypertension: Secondary | ICD-10-CM | POA: Diagnosis not present

## 2024-02-13 MED ORDER — SODIUM CHLORIDE 0.9 % IV SOLN: INTRAVENOUS | Status: AC | PRN

## 2024-02-13 NOTE — Progress Notes (Signed)
 PROGRESS NOTE  Gary Harrington, is a 63 y.o. male, DOB - 1961-01-16, ZOX:[id number]  Admit date - 02/11/2024   Admitting Physician Shon Hale, MD  Outpatient Primary MD for the patient is Veverly Fells, MD  LOS - 2  Chief Complaint  Patient presents with   Leg Pain        Brief Narrative:  63 y.o. male with medical history significant of tobacco abuse/COPD, anxiety/depression, and HTN and questionable history of prior stroke/TIA back in 2000--admitted on 02/11/2024 with right leg cellulitis after trauma but 3 weeks prior, previously treated as outpatient clindamycin appears to have failed outpatient antibiotic    -Assessment and Plan: 1)Rt Leg cellulitis ----failed outpatient clindamycin--- -continue IV vancomycin as ordered -Pain control -Treated with topical hydrocortisone cream  -Wound care consult appreciated, recommends Silvadene cream -Overall improving , possible discharge in 1 to 2 days if continues to improve   2)Depression/Anxiety--c/n Zoloft 25 mg daily, Xanax as needed   3)COPD--no COPD flareup -As needed albuterol  4)Tobacco Abuse--  Smoking cessation advised  C/n Nicotine patch  Status is: Inpatient   Disposition: The patient is from: Home              Anticipated d/c is to: Home              Anticipated d/c date is: 1 day              Patient currently is not medically stable to d/c. Barriers: Not Clinically Stable-   Code Status :  -  Code Status: Full Code   Family Communication:   NA (patient is alert, awake and coherent)   DVT Prophylaxis  :   - SCDs   heparin injection 5,000 Units Start: 02/11/24 2200 SCDs Start: 02/11/24 1619 Place TED hose Start: 02/11/24 1619   Lab Results  Component Value Date   PLT 256 02/11/2024   Inpatient Medications  Scheduled Meds:  heparin  5,000 Units Subcutaneous Q8H   hydrocortisone   Topical TID   nicotine  21 mg Transdermal Daily   sertraline  25 mg Oral Daily   silver sulfADIAZINE   Topical BID    sodium chloride flush  3 mL Intravenous Q12H   sodium chloride flush  3 mL Intravenous Q12H   Continuous Infusions:  sodium chloride 10 mL/hr at 02/13/24 1028   vancomycin 750 mg (02/13/24 1031)   PRN Meds:.sodium chloride, acetaminophen **OR** acetaminophen, albuterol, ALPRAZolam, bisacodyl, ibuprofen, ondansetron **OR** ondansetron (ZOFRAN) IV, polyethylene glycol, sodium chloride flush, traMADol   Anti-infectives (From admission, onward)    Start     Dose/Rate Route Frequency Ordered Stop   02/11/24 2200  vancomycin (VANCOREADY) IVPB 750 mg/150 mL        750 mg 150 mL/hr over 60 Minutes Intravenous Every 12 hours 02/11/24 1649     02/11/24 1645  vancomycin (VANCOCIN) IVPB 1000 mg/200 mL premix  Status:  Discontinued        1,000 mg 200 mL/hr over 60 Minutes Intravenous  Once 02/11/24 1633 02/11/24 1649   02/11/24 1030  vancomycin (VANCOCIN) IVPB 1000 mg/200 mL premix  Status:  Discontinued        1,000 mg 200 mL/hr over 60 Minutes Intravenous  Once 02/11/24 1016 02/11/24 1024   02/11/24 1030  vancomycin (VANCOREADY) IVPB 2000 mg/400 mL        2,000 mg 200 mL/hr over 120 Minutes Intravenous  Once 02/11/24 1024 02/11/24 1358       Subjective: Gary Harrington today  has no fevers, no emesis,  No chest pain,   Pt states that Rt Leg swelling is improving---- -Rt Leg discomfort is not worse  Objective: Vitals:   02/12/24 1352 02/12/24 1946 02/13/24 0420 02/13/24 1300  BP: 105/63 124/76 116/63 114/76  Pulse: 61 64 69 63  Resp: 20 18 18 18   Temp:  (!) 97.5 F (36.4 C) 98.9 F (37.2 C) 97.8 F (36.6 C)  TempSrc:  Oral  Oral  SpO2: 98% 97% 95% 95%  Weight:      Height:        Intake/Output Summary (Last 24 hours) at 02/13/2024 1543 Last data filed at 02/13/2024 0438 Gross per 24 hour  Intake 240 ml  Output --  Net 240 ml   Filed Weights   02/11/24 0809 02/11/24 2015  Weight: 95.3 kg 103.3 kg    Physical Exam Gen:- Awake Alert,  in no apparent distress  HEENT:-  [place].AT, No sclera icterus Neck-Supple Neck,No JVD,.  Lungs-  CTAB , fair symmetrical air movement CV- S1, S2 normal, regular  Abd-  +ve B.Sounds, Abd Soft, No tenderness,    Psych-affect is appropriate, oriented x3 Neuro-no new focal deficits, no tremors Extremity/Skin:-  pedal pulses present  --Improving right leg erythema, warmth swelling and discomfort consistent with cellulitis- please see photos in epic  Data Reviewed: I have personally reviewed following labs and imaging studies  CBC: Recent Labs  Lab 02/08/24 2015 02/11/24 0944  WBC 9.7 9.3  NEUTROABS  --  6.3  HGB 13.0 13.8  HCT 40.9 43.3  MCV 67.9* 67.4*  PLT 253 256   Basic Metabolic Panel: Recent Labs  Lab 02/08/24 2015 02/11/24 0944 02/12/24 0357  NA 138 136 136  K 4.4 4.5 4.3  CL 105 105 103  CO2 27 25 24   GLUCOSE 84 96 91  BUN 16 16 15   CREATININE 0.97 0.88 1.02  CALCIUM 8.9 8.9 8.4*   GFR: Estimated Creatinine Clearance: 86.6 mL/min (by C-G formula based on SCr of 1.02 mg/dL).  Recent Results (from the past 240 hours)  Blood culture (routine x 2)     Status: None (Preliminary result)   Collection Time: 02/11/24 10:45 AM   Specimen: BLOOD  Result Value Ref Range Status   Specimen Description BLOOD LEFT ANTECUBITAL  Final   Special Requests   Final    BOTTLES DRAWN AEROBIC AND ANAEROBIC Blood Culture adequate volume   Culture   Final    NO GROWTH 2 DAYS Performed at [facility], [address] [place], [place] [address]    Report Status PENDING  Incomplete  Blood culture (routine x 2)     Status: None (Preliminary result)   Collection Time: 02/11/24 10:54 AM   Specimen: BLOOD  Result Value Ref Range Status   Specimen Description BLOOD RIGHT ANTECUBITAL  Final   Special Requests   Final    BOTTLES DRAWN AEROBIC AND ANAEROBIC Blood Culture adequate volume   Culture   Final    NO GROWTH 2 DAYS Performed at [facility], [address], [place], [place] [address]    Report Status PENDING   Incomplete    Radiology Studies:  Scheduled Meds:  heparin  5,000 Units Subcutaneous Q8H   hydrocortisone   Topical TID   nicotine  21 mg Transdermal Daily   sertraline  25 mg Oral Daily   silver sulfADIAZINE   Topical BID   sodium chloride flush  3 mL Intravenous Q12H   sodium chloride flush  3  mL Intravenous Q12H   Continuous Infusions:  sodium chloride 10 mL/hr at 02/13/24 1028   vancomycin 750 mg (02/13/24 1031)    LOS: 2 days   Shon Hale M.D on 02/13/2024 at 3:43 PM  Go to [email or web address] - for contact info  [facility] - Office  [phone]  If 7PM-7AM, please contact night-coverage [email or web address] 02/13/2024, 3:43 PM

## 2024-02-13 NOTE — Progress Notes (Signed)
   02/13/24 0557  Provider Notification  Provider Name/Title Thomes Dinning, DO  Date Provider Notified 02/13/24  Time Provider Notified [phone]  Method of Notification Page  Notification Reason Red med refusal  Provider response No new orders

## 2024-02-13 NOTE — Plan of Care (Signed)

## 2024-02-13 NOTE — Progress Notes (Signed)
   02/13/24 2129  Provider Notification  Provider Name/Title Thomes Dinning, DO  Date Provider Notified 02/13/24  Time Provider Notified 2129  Method of Notification Page  Notification Reason Red med refusal  Provider response No new orders

## 2024-02-14 DIAGNOSIS — L03115 Cellulitis of right lower limb: Secondary | ICD-10-CM | POA: Diagnosis not present

## 2024-02-14 DIAGNOSIS — I10 Essential (primary) hypertension: Secondary | ICD-10-CM | POA: Diagnosis not present

## 2024-02-14 DIAGNOSIS — Z720 Tobacco use: Secondary | ICD-10-CM | POA: Diagnosis not present

## 2024-02-14 DIAGNOSIS — F411 Generalized anxiety disorder: Secondary | ICD-10-CM

## 2024-02-14 MED ORDER — HYDROCORTISONE 1 % EX OINT: TOPICAL_OINTMENT | Freq: Two times a day (BID) | CUTANEOUS | 1 refills | Status: AC

## 2024-02-14 MED ORDER — ACETAMINOPHEN 325 MG PO TABS: 650.0000 mg | ORAL_TABLET | Freq: Four times a day (QID) | ORAL | Status: AC | PRN

## 2024-02-14 MED ORDER — SERTRALINE HCL 50 MG PO TABS: 50.0000 mg | ORAL_TABLET | Freq: Every day | ORAL | 5 refills | Status: AC

## 2024-02-14 MED ORDER — CEPHALEXIN 250 MG/5ML PO SUSR: 500.0000 mg | Freq: Three times a day (TID) | ORAL | 0 refills | Status: AC

## 2024-02-14 MED ORDER — NICOTINE 21 MG/24HR TD PT24: 21.0000 mg | MEDICATED_PATCH | Freq: Every day | TRANSDERMAL | 0 refills | Status: AC

## 2024-02-14 MED ORDER — ALBUTEROL SULFATE HFA 108 (90 BASE) MCG/ACT IN AERS: 2.0000 | INHALATION_SPRAY | RESPIRATORY_TRACT | 0 refills | Status: AC | PRN

## 2024-02-14 MED ORDER — DOXYCYCLINE HYCLATE 100 MG PO TABS: 100.0000 mg | ORAL_TABLET | Freq: Two times a day (BID) | ORAL | 0 refills | Status: AC

## 2024-02-14 MED ORDER — LACTINEX PO CHEW: 1.0000 | CHEWABLE_TABLET | Freq: Three times a day (TID) | ORAL | 0 refills | Status: AC

## 2024-02-14 MED ORDER — ALPRAZOLAM 1 MG PO TABS: 1.0000 mg | ORAL_TABLET | Freq: Every day | ORAL | Status: AC | PRN

## 2024-02-14 NOTE — Discharge Instructions (Signed)
 1)Please Take Cephalexin and Doxycycline antibiotics as prescribed 2)Use Over the counter Nicotine Patch to help you quit smoking 3)Follow up with Veverly Fells, MD (Primary Care Provider)--in 5 to 7 days for recheck 4)Ok to Use Store Brand Probiotics supplements to help prevent diarrhea while on antibiotics

## 2024-02-14 NOTE — Discharge Summary (Signed)
 Gary Harrington, is a 63 y.o. male  DOB 1961-11-09  MRN [id number].  Admission date:  02/11/2024  Admitting Physician  Shon Hale, MD  Discharge Date:  02/14/2024   Primary MD  Veverly Fells, MD  Recommendations for primary care physician for things to follow:  1)Please Take Cephalexin and Doxycycline antibiotics as prescribed 2)Use Over the counter Nicotine Patch to help you quit smoking 3)Follow up with Veverly Fells, MD (Primary Care Provider)--in 5 to 7 days for recheck 4)Ok to Use Store Brand Probiotics supplements to help prevent diarrhea while on antibiotics  Admission Diagnosis  Cellulitis [L03.90] Cellulitis of right lower extremity [L03.115]   Discharge Diagnosis  Cellulitis [L03.90] Cellulitis of right lower extremity [L03.115]    Principal Problem:   Cellulitis of Rt Leg Active Problems:   Anxiety disorder   Essential hypertension   Hyperlipidemia   Tobacco abuse      Past Medical History:  Diagnosis Date   Anxiety    Chronic pain    COPD (chronic obstructive pulmonary disease) (HCC)    Depression    Dizziness    Dyspnea    Headache    Hypertension    Stroke (HCC) 2000    Past Surgical History:  Procedure Laterality Date   TONSILLECTOMY  age 25     HPI  from the history and physical done on the day of admission:    HPI: Gary Harrington is a 64 y.o. male with medical history significant of tobacco abuse/COPD, anxiety/depression, and HTN and questionable history of prior stroke/TIA back in 2000... Presents to the ED with right leg erythema, warmth and discomfort--over the last 3 weeks or so -Pt Reports 3 weeks ago he scraped his leg on a car and had an abrasion. He has been cleaning the wound almost daily, but still is not healing.--He was seen in the ED on 02/03/2024 and prescribed clindamycin, seen again in the ED on 02/06/2024, and again the ED on 02/09/2024 and  topical ketoconazole was added to his clindamycin antibiotic-- -No fever  Or chills  No Nausea, Vomiting or Diarrhea -right leg x-rays from 02/03/2024 was consistent with cellulitis without fracture or foreign body -Right lower extremity venous Doppler from 02/03/2024 without acute DVT -CT tibia-fibula of the right lower extremity on 02/11/2024 shows no acute bony abnormalities, findings are consistent with cellulitis without loculated fluid or soft tissue gas -CBC without leukocytosis, anemia or thrombocytopenia -BMP and lactic acid unremarkable   -- Patient will be admitted to the hospital for IV antibiotics after failing outpatient management   Review of Systems: As mentioned in the history of present illness. All other systems reviewed and are negative.   Hospital Course:     Brief Narrative:  63 y.o. male with medical history significant of tobacco abuse/COPD, anxiety/depression, and HTN and questionable history of prior stroke/TIA back in 2000--admitted on 02/11/2024 with right leg cellulitis after trauma but 3 weeks prior, previously treated as outpatient clindamycin appears to have failed outpatient antibiotic     -Assessment  and Plan: 1)Rt Leg cellulitis ----failed outpatient clindamycin--- -treated with IV vancomycin -Oral swelling discomfort and redness has improved significantly -Treated with topical hydrocortisone cream  -Wound care consult appreciated, recommends Silvadene cream -Discharge on Keflex and doxycycline   2)Depression/Anxiety--  Zoloft 50 mg daily, as needed Xanax   3)COPD--no COPD flareup -As needed albuterol   4)Tobacco Abuse--  Smoking cessation advised  C/n Nicotine patch   Disposition: The patient is from: Home              Anticipated d/c is to: Home  Discharge Condition: stable  Follow UP   Follow-up Information     Veverly Fells, MD. Schedule an appointment as soon as possible for a visit in 1 week(s).   Specialty: Internal  Medicine Why: For Recheck Contact information: [address] [place] [address] [phone]                 Diet and Activity recommendation:  As advised  Discharge Instructions    Discharge Instructions     Ambulatory Referral for Lung Cancer Scre   Complete by: As directed    Call MD for:  difficulty breathing, headache or visual disturbances   Complete by: As directed    Call MD for:  persistant dizziness or light-headedness   Complete by: As directed    Call MD for:  persistant nausea and vomiting   Complete by: As directed    Call MD for:  redness, tenderness, or signs of infection (pain, swelling, redness, odor or green/yellow discharge around incision site)   Complete by: As directed    Call MD for:  severe uncontrolled pain   Complete by: As directed    Call MD for:  temperature >100.4   Complete by: As directed    Diet - low sodium heart healthy   Complete by: As directed    Discharge diet:   Complete by: As directed    Discharge instructions   Complete by: As directed    1)Please Take Cephalexin and Doxycycline antibiotics as prescribed 2)Use Over the counter Nicotine Patch to help you quit smoking 3)Follow up with Veverly Fells, MD (Primary Care Provider)--in 5 to 7 days for recheck 4)Ok to Use Store Brand Probiotics supplements to help prevent diarrhea while on antibiotics   Discharge wound care:   Complete by: As directed    As above   Increase activity slowly   Complete by: As directed        Discharge Medications     Allergies as of 02/14/2024       Reactions   Codeine Hives, Itching   Other Itching   All Steroids   Penicillins Hives   immediate rash, facial/tongue/throat swelling, SOB or lightheadedness with hypotension   Prednisone Hives        Medication List     STOP taking these medications    ibuprofen 800 MG tablet Commonly known as: ADVIL       TAKE these medications    acetaminophen 325 MG  tablet Commonly known as: TYLENOL Take 2 tablets (650 mg total) by mouth every 6 (six) hours as needed for mild pain (pain score 1-3) (or Fever >/= 101).   albuterol 108 (90 Base) MCG/ACT inhaler Commonly known as: VENTOLIN HFA Inhale 2 puffs into the lungs every 4 (four) hours as needed for wheezing or shortness of breath. What changed: when to take this   ALPRAZolam 1 MG tablet Commonly known as:  XANAX Take 1 tablet (1 mg total) by mouth daily as needed for anxiety. What changed:  when to take this reasons to take this   cephALEXin 250 MG/5ML suspension Commonly known as: KEFLEX Take 10 mLs (500 mg total) by mouth 3 (three) times daily for 7 days.   doxycycline 100 MG tablet Commonly known as: VIBRA-TABS Take 1 tablet (100 mg total) by mouth 2 (two) times daily for 7 days.   fluticasone 50 MCG/ACT nasal spray Commonly known as: FLONASE Place 2 sprays into both nostrils daily.   furosemide 20 MG tablet Commonly known as: LASIX Take 1 tablet (20 mg total) by mouth daily for 7 days.   hydrocortisone 1 % ointment Apply topically 2 (two) times daily. Apply to right leg Rash twice daily   lactobacillus acidophilus & bulgar chewable tablet Chew 1 tablet by mouth 3 (three) times daily with meals. Ok to substitute Store Brand Probiotics   nicotine 21 mg/24hr patch Commonly known as: NICODERM CQ - dosed in mg/24 hours Place 1 patch (21 mg total) onto the skin daily. Start taking on: February 15, 2024   sertraline 50 MG tablet Commonly known as: ZOLOFT Take 1 tablet (50 mg total) by mouth daily. Start taking on: February 15, 2024               Discharge Care Instructions  (From admission, onward)           Start     Ordered   02/14/24 0000  Discharge wound care:       Comments: As above   02/14/24 1350           Major procedures and Radiology Reports - PLEASE review detailed and final reports for all details, in brief -  CT TIBIA FIBULA RIGHT W  CONTRAST Result Date: 02/11/2024 CLINICAL DATA:  Soft tissue infection suspected, right lower leg. EXAM: CT OF THE LOWER RIGHT EXTREMITY WITH CONTRAST TECHNIQUE: Multidetector CT imaging of the lower right extremity was performed according to the standard protocol following intravenous contrast administration. RADIATION DOSE REDUCTION: This exam was performed according to the departmental dose-optimization program which includes automated exposure control, adjustment of the mA and/or kV according to patient size and/or use of iterative reconstruction technique. CONTRAST:  OMNIPAQUE IOHEXOL 300 MG/ML  SOLN COMPARISON:  Right tibia and fibula radiographs dated 02/03/2024. FINDINGS: Bones/Joint/Cartilage No acute fracture or dislocation. Normal alignment. No joint effusion. Mild medial femorotibial compartment joint space narrowing. Calcaneal enthesopathy at the insertion of the Achilles tendon and the origin of the central cord of the plantar fascia. Mild degenerative changes of the first MTP joint. Ligaments Ligaments are suboptimally evaluated by CT. Muscles and Tendons No intramuscular fluid collection or hematoma. A 3.0 x 1.2 x 0.7 cm fat density focus within the lateral gastrocnemius muscle may represent an intramuscular lipoma. No discrete soft tissue nodular component. Soft tissue Diffuse circumferential subcutaneous edema and infiltration extending from the knee through the distal right leg to the level of the ankle. No loculated fluid collection identified. No soft tissue gas. There is overlying cutaneous thickening. IMPRESSION: 1. Diffuse circumferential subcutaneous edema extending from the right knee through the distal leg to the level of the ankle with overlying cutaneous thickening, concerning for cellulitis. No loculated fluid collection or soft tissue gas identified. 2. No acute osseous abnormality. 3. A 3 cm fat density lesion within the lateral gastrocnemius muscle may represent an  intramuscular lipoma. No discrete soft tissue nodular component. Electronically Signed  By: Hart Robinsons M.D.   On: 02/11/2024 12:50   DG Tibia/Fibula Right Result Date: 02/03/2024 CLINICAL DATA:  Lower extremity pain and edema EXAM: RIGHT TIBIA AND FIBULA - 2 VIEW COMPARISON:  None Available. FINDINGS: No acute fracture or malalignment. No lytic or blastic osseous lesion. No subcutaneous emphysema or radiopaque foreign body. Diffuse reticulation of the subcutaneous fat consistent with edema or cellulitis. IMPRESSION: 1. No evidence of fracture, malalignment, foreign body or bony lesion. 2. Diffuse reticulation of the subcutaneous fat consistent with edema or cellulitis. Electronically Signed   By: Malachy Moan M.D.   On: 02/03/2024 12:45   US Venous Img Lower Unilateral Right Result Date: 02/03/2024 CLINICAL DATA:  Right lower extremity swelling, edema EXAM: RIGHT LOWER EXTREMITY VENOUS DOPPLER ULTRASOUND TECHNIQUE: Gray-scale sonography with compression, as well as color and duplex ultrasound, were performed to evaluate the deep venous system(s) from the level of the common femoral vein through the popliteal and proximal calf veins. COMPARISON:  None Available. FINDINGS: VENOUS Normal compressibility of the common femoral, superficial femoral, and popliteal veins, as well as the visualized calf veins. Visualized portions of profunda femoral vein and great saphenous vein unremarkable. No filling defects to suggest DVT on grayscale or color Doppler imaging. Doppler waveforms show normal direction of venous flow, normal respiratory plasticity and response to augmentation. Limited views of the contralateral common femoral vein are unremarkable. OTHER None. Limitations: none IMPRESSION: Negative. Electronically Signed   By: Malachy Moan M.D.   On: 02/03/2024 12:41   Micro Results  Recent Results (from the past 240 hours)  Blood culture (routine x 2)     Status: None (Preliminary result)    Collection Time: 02/11/24 10:45 AM   Specimen: BLOOD  Result Value Ref Range Status   Specimen Description BLOOD LEFT ANTECUBITAL  Final   Special Requests   Final    BOTTLES DRAWN AEROBIC AND ANAEROBIC Blood Culture adequate volume   Culture   Final    NO GROWTH 3 DAYS Performed at [facility], [address] [place], [place] [address]    Report Status PENDING  Incomplete  Blood culture (routine x 2)     Status: None (Preliminary result)   Collection Time: 02/11/24 10:54 AM   Specimen: BLOOD  Result Value Ref Range Status   Specimen Description BLOOD RIGHT ANTECUBITAL  Final   Special Requests   Final    BOTTLES DRAWN AEROBIC AND ANAEROBIC Blood Culture adequate volume   Culture   Final    NO GROWTH 3 DAYS Performed at [facility], [address] [place], [place] [address]    Report Status PENDING  Incomplete   Today   Subjective    Gary Harrington today has no new complaints No fever  Or chills  -Ambulating around the room -Patient states her right leg swelling and discomfort has improved significantly          Patient has been seen and examined prior to discharge   Objective   Blood pressure 110/69, pulse 69, temperature 98.4 F (36.9 C), resp. rate 18, height 5\' 9"  (1.753 m), weight 103.3 kg, SpO2 99%.   Intake/Output Summary (Last 24 hours) at 02/14/2024 1351 Last data filed at 02/14/2024 0929 Gross per 24 hour  Intake 1035.06 ml  Output --  Net 1035.06 ml   Exam Gen:- Awake Alert, no acute distress  HEENT:- [place].AT, No sclera icterus Neck-Supple Neck,No JVD,.  Lungs-  CTAB , good air movement bilaterally CV- S1, S2 normal, regular  Abd-  +ve B.Sounds, Abd Soft, No tenderness,    Psych-affect is appropriate, oriented x3 Neuro-no new focal deficits, no tremors  Extremity/Skin:-  pedal pulses present  --Improving right leg erythema, warmth swelling and discomfort consistent with cellulitis- please see photos in epic  -   Data Review   CBC w Diff:  Lab  Results  Component Value Date   WBC 9.3 02/11/2024   HGB 13.8 02/11/2024   HCT 43.3 02/11/2024   PLT 256 02/11/2024   LYMPHOPCT 17 02/11/2024   MONOPCT 7 02/11/2024   EOSPCT 8 02/11/2024   BASOPCT 1 02/11/2024   CMP:  Lab Results  Component Value Date   NA 136 02/12/2024   K 4.3 02/12/2024   CL 103 02/12/2024   CO2 24 02/12/2024   BUN 15 02/12/2024   CREATININE 1.02 02/12/2024   PROT 7.0 04/14/2023   ALBUMIN 4.0 04/14/2023   BILITOT 0.9 04/14/2023   ALKPHOS 58 04/14/2023   AST 20 04/14/2023   ALT 19 04/14/2023  . Total Discharge time is about 33 minutes  Shon Hale M.D on 02/14/2024 at 1:51 PM  Go to [email or web address] -  for contact info  [facility] - Office  [phone]

## 2024-02-14 NOTE — Plan of Care (Signed)
  Problem: Education: Goal: Knowledge of General Education information will improve Description: Including pain rating scale, medication(s)/side effects and non-pharmacologic comfort measures Outcome: Completed/Met   Problem: Health Behavior/Discharge Planning: Goal: Ability to manage health-related needs will improve Outcome: Completed/Met   Problem: Clinical Measurements: Goal: Ability to maintain clinical measurements within normal limits will improve Outcome: Completed/Met Goal: Will remain free from infection Outcome: Completed/Met Goal: Diagnostic test results will improve Outcome: Completed/Met Goal: Respiratory complications will improve Outcome: Completed/Met Goal: Cardiovascular complication will be avoided Outcome: Completed/Met   Problem: Activity: Goal: Risk for activity intolerance will decrease Outcome: Completed/Met   Problem: Nutrition: Goal: Adequate nutrition will be maintained Outcome: Completed/Met   Problem: Coping: Goal: Level of anxiety will decrease Outcome: Completed/Met   Problem: Elimination: Goal: Will not experience complications related to bowel motility Outcome: Completed/Met Goal: Will not experience complications related to urinary retention Outcome: Completed/Met   Problem: Pain Managment: Goal: General experience of comfort will improve and/or be controlled Outcome: Completed/Met   Problem: Safety: Goal: Ability to remain free from injury will improve Outcome: Completed/Met   Problem: Skin Integrity: Goal: Risk for impaired skin integrity will decrease Outcome: Completed/Met   Problem: Clinical Measurements: Goal: Ability to avoid or minimize complications of infection will improve Outcome: Completed/Met   Problem: Skin Integrity: Goal: Skin integrity will improve Outcome: Completed/Met

## 2024-02-16 LAB — CULTURE, BLOOD (ROUTINE X 2)
Culture: NO GROWTH
Culture: NO GROWTH
Special Requests: ADEQUATE
Special Requests: ADEQUATE

## 2024-02-25 ENCOUNTER — Other Ambulatory Visit: Payer: Self-pay

## 2024-02-25 ENCOUNTER — Emergency Department (HOSPITAL_COMMUNITY)
Admission: EM | Admit: 2024-02-25 | Discharge: 2024-02-26 | Discharge status: Against Medical Advice | Attending: Student | Admitting: Student

## 2024-02-25 ENCOUNTER — Encounter (HOSPITAL_COMMUNITY): Payer: Self-pay

## 2024-02-25 DIAGNOSIS — L0390 Cellulitis, unspecified: Secondary | ICD-10-CM

## 2024-02-25 DIAGNOSIS — Z5321 Procedure and treatment not carried out due to patient leaving prior to being seen by health care provider: Secondary | ICD-10-CM | POA: Insufficient documentation

## 2024-02-25 DIAGNOSIS — M7989 Other specified soft tissue disorders: Secondary | ICD-10-CM | POA: Insufficient documentation

## 2024-02-25 HISTORY — DX: Cellulitis, unspecified: L03.90

## 2024-02-25 NOTE — ED Provider Notes (Signed)
 Patient left without being seen from triage.  I did not see or evaluate this patient   Glendora Score, MD 02/25/24 [phone]

## 2024-02-25 NOTE — ED Triage Notes (Signed)
 Pt reports right leg swelling "has swollen real bad and I don't want to lose my leg".

## 2024-02-25 NOTE — ED Triage Notes (Signed)
 Pt arrived via POV c/o leg swelling. Pt recently admitted for cellulitis in his leg and prescribed antibiotics. Pt ambulatory in Triage, presents in NAD.

## 2024-02-26 ENCOUNTER — Other Ambulatory Visit: Payer: Self-pay | Admitting: Emergency Medicine

## 2024-02-26 ENCOUNTER — Telehealth: Payer: Self-pay | Admitting: Acute Care

## 2024-02-26 DIAGNOSIS — Z122 Encounter for screening for malignant neoplasm of respiratory organs: Secondary | ICD-10-CM

## 2024-02-26 DIAGNOSIS — F17210 Nicotine dependence, cigarettes, uncomplicated: Secondary | ICD-10-CM

## 2024-02-26 DIAGNOSIS — Z87891 Personal history of nicotine dependence: Secondary | ICD-10-CM

## 2024-02-26 NOTE — Telephone Encounter (Signed)
 Lung Cancer Screening Narrative/Criteria Questionnaire (Cigarette Smokers Only- No Cigars/Pipes/vapes)   Gary Harrington   SDMV:03/07/2024 at 8:45 with Orpha Bur        [birth year]   LDCT: 03/09/2024 at 8:30am at [facility]    63 y.o.   Phone: [phone]  Lung Screening Narrative (confirm age 93-77 yrs Medicare / 50-80 yrs Private pay insurance)   Insurance information:Medicaid [employer]   Referring Provider:Dr. Mariea Clonts   This screening involves an initial phone call with a team member from our program. It is called a shared decision making visit. The initial meeting is required by  insurance and Medicare to make sure you understand the program. This appointment takes about 15-20 minutes to complete. You will complete the screening scan at your scheduled date/time.  This scan takes about 5-10 minutes to complete. You can eat and drink normally before and after the scan.  Criteria questions for Lung Cancer Screening:   Are you a current or former smoker? Current Age began smoking: 63yo   If you are a former smoker, what year did you quit smoking? N/A(within 15 yrs)   To calculate your smoking history, I need an accurate estimate of how many packs of cigarettes you smoked per day and for how many years. (Not just the number of PPD you are now smoking)   Years smoking 51 x Packs per day 2 = Pack years 102   (at least 20 pack yrs)   (Make sure they understand that we need to know how much they have smoked in the past, not just the number of PPD they are smoking now)  Do you have a personal history of cancer?  No    Do you have a family history of cancer? No  Are you coughing up blood?  No  Have you had unexplained weight loss of 15 lbs or more in the last 6 months? No  It looks like you meet all criteria.  When would be a good time for [place] to schedule you for this screening?   Additional information: N/A

## 2024-03-01 ENCOUNTER — Ambulatory Visit: Payer: Medicaid Other | Attending: Internal Medicine | Admitting: Internal Medicine

## 2024-03-01 NOTE — Progress Notes (Signed)
 Erroneous encounter - please disregard.

## 2024-03-07 ENCOUNTER — Encounter: Admitting: Adult Health

## 2024-03-07 ENCOUNTER — Encounter: Payer: Self-pay | Admitting: Adult Health

## 2024-03-09 ENCOUNTER — Ambulatory Visit (HOSPITAL_COMMUNITY)

## 2024-04-10 ENCOUNTER — Other Ambulatory Visit: Payer: Self-pay

## 2024-04-10 ENCOUNTER — Encounter (HOSPITAL_COMMUNITY): Payer: Self-pay | Admitting: Emergency Medicine

## 2024-04-10 ENCOUNTER — Emergency Department (HOSPITAL_COMMUNITY)
Admission: EM | Admit: 2024-04-10 | Discharge: 2024-04-10 | Disposition: A | Discharge status: Home / Self Care | Attending: Emergency Medicine | Admitting: Emergency Medicine

## 2024-04-10 DIAGNOSIS — Z760 Encounter for issue of repeat prescription: Secondary | ICD-10-CM | POA: Diagnosis not present

## 2024-04-10 DIAGNOSIS — R2243 Localized swelling, mass and lump, lower limb, bilateral: Secondary | ICD-10-CM | POA: Diagnosis present

## 2024-04-10 MED ORDER — FUROSEMIDE 40 MG PO TABS
20.0000 mg | ORAL_TABLET | Freq: Once | ORAL | Status: AC
Start: 2024-04-10 — End: 2024-04-10
  Administered 2024-04-10: 20 mg via ORAL
  Filled 2024-04-10: qty 1

## 2024-04-10 MED ORDER — FUROSEMIDE 20 MG PO TABS: 20.0000 mg | ORAL_TABLET | Freq: Two times a day (BID) | ORAL | 0 refills | Status: AC | PRN

## 2024-04-10 NOTE — ED Triage Notes (Signed)
 Pt c/o needed a refill on on his lasix  medication. States he has been out for a few days.

## 2024-04-10 NOTE — ED Provider Notes (Signed)
 [place] EM[facility]GENCY DEPARTMENT AT [facility] Provider Note   CSN: [id number] Arrival date & time: 04/10/24  0502     History  Chief Complaint  Patient presents with   Medication Refill    Gary Harrington is a 63 y.o. male.  Patient presented with swelling of both ankles.  He has been out of his Lasix  for the past few days.  He has no chest pain or difficulty breathing.  No other complaints.       Home Medications Prior to Admission medications   Medication Sig Start Date End Date Taking? Authorizing Provider  acetaminophen  (TYLENOL ) 325 MG tablet Take 2 tablets (650 mg total) by mouth every 6 (six) hours as needed for mild pain (pain score 1-3) (or Fever >/= 101). 02/14/24   Emokpae, Courage, MD  albuterol  (VENTOLIN  HFA) 108 (90 Base) MCG/ACT inhaler Inhale 2 puffs into the lungs every 4 (four) hours as needed for wheezing or shortness of breath. 02/14/24   Colin Dawley, MD  ALPRAZolam  (XANAX ) 1 MG tablet Take 1 tablet (1 mg total) by mouth daily as needed for anxiety. 02/14/24   Colin Dawley, MD  fluticasone  (FLONASE ) 50 MCG/ACT nasal spray Place 2 sprays into both nostrils daily. Patient not taking: Reported on 02/11/2024 08/18/23   Mesner, Reymundo Caulk, MD  furosemide  (LASIX ) 20 MG tablet Take 1 tablet (20 mg total) by mouth daily for 7 days. 02/03/24 02/11/24  Rolinda Climes, DO  hydrocortisone  1 % ointment Apply topically 2 (two) times daily. Apply to right leg Rash twice daily 02/14/24   Colin Dawley, MD  lactobacillus acidophilus & bulgar (LACTINEX) chewable tablet Chew 1 tablet by mouth 3 (three) times daily with meals. Ok to substitute Store Brand Probiotics 02/14/24   Colin Dawley, MD  nicotine  (NICODERM CQ  - DOSED IN MG/24 HOURS) 21 mg/24hr patch Place 1 patch (21 mg total) onto the skin daily. 02/15/24   Colin Dawley, MD  sertraline  (ZOLOFT ) 50 MG tablet Take 1 tablet (50 mg total) by mouth daily. 02/15/24   Colin Dawley, MD      Allergies     Codeine, Other, Penicillins, and Prednisone     Review of Systems   Review of Systems  All other systems reviewed and are negative.   Physical Exam Updated Vital Signs BP 119/83 (BP Location: Left Arm)   Pulse 67   Temp 97.6 F (36.4 C) (Oral)   Resp 20   Ht 5\' 9"  (1.753 m)   Wt 103.3 kg   SpO2 96%   BMI 33.63 kg/m  Physical Exam Vitals and nursing note reviewed.  Constitutional:      Appearance: Normal appearance.  Pulmonary:     Effort: Pulmonary effort is normal.  Musculoskeletal:     Comments: Patient has perhaps trace edema of both ankles.  No redness or erythema.  Skin:    General: Skin is warm and dry.  Neurological:     Mental Status: He is alert and oriented to person, place, and time.     ED Results / Procedures / Treatments   Labs (all labs ordered are listed, but only abnormal results are displayed) Labs Reviewed - No data to display  EKG None  Radiology No results found.  Procedures Procedures    Medications Ordered in ED Medications  furosemide  (LASIX ) tablet 20 mg (has no administration in time range)    ED Course/ Medical Decision Making/ A&P  I will refill his Lasix .  Patient to follow-up with primary doctor.  Final Clinical Impression(s) / ED Diagnoses Final diagnoses:  None    Rx / DC Orders ED Discharge Orders     None         Orvilla Blander, MD 04/10/24 [phone]

## 2024-04-10 NOTE — Discharge Instructions (Signed)
 Resume taking Lasix  as prescribed.  Follow-up with your primary doctor for any issues.

## 2024-04-17 ENCOUNTER — Emergency Department (HOSPITAL_COMMUNITY)
Admission: EM | Admit: 2024-04-17 | Discharge: 2024-04-17 | Disposition: A | Discharge status: Home / Self Care | Attending: Emergency Medicine | Admitting: Emergency Medicine

## 2024-04-17 ENCOUNTER — Encounter (HOSPITAL_COMMUNITY): Payer: Self-pay | Admitting: Emergency Medicine

## 2024-04-17 ENCOUNTER — Other Ambulatory Visit: Payer: Self-pay

## 2024-04-17 DIAGNOSIS — H9201 Otalgia, right ear: Secondary | ICD-10-CM

## 2024-04-17 DIAGNOSIS — H6121 Impacted cerumen, right ear: Secondary | ICD-10-CM | POA: Insufficient documentation

## 2024-04-17 MED ORDER — ACETAMINOPHEN 325 MG PO TABS
650.0000 mg | ORAL_TABLET | Freq: Once | ORAL | Status: DC
  Filled 2024-04-17: qty 2

## 2024-04-17 NOTE — ED Provider Notes (Signed)
 [place] EMERGENCY DEPARTMENT AT [facility]  Provider Note  CSN: [id number] Arrival date & time: 04/17/24 6160  History Chief Complaint  Patient presents with   Otalgia    Gary Harrington is a 63 y.o. male here for R ear pain, off and on for about a month but worse in the last few days since he tried to clean it out with a Q-tip. Reports a throbbing/humming sensation. No fever, no drainage or bleeding.   Home Medications Prior to Admission medications   Medication Sig Start Date End Date Taking? Authorizing Provider  acetaminophen  (TYLENOL ) 325 MG tablet Take 2 tablets (650 mg total) by mouth every 6 (six) hours as needed for mild pain (pain score 1-3) (or Fever >/= 101). 02/14/24   Emokpae, Courage, MD  albuterol  (VENTOLIN  HFA) 108 (90 Base) MCG/ACT inhaler Inhale 2 puffs into the lungs every 4 (four) hours as needed for wheezing or shortness of breath. 02/14/24   Colin Dawley, MD  ALPRAZolam  (XANAX ) 1 MG tablet Take 1 tablet (1 mg total) by mouth daily as needed for anxiety. 02/14/24   Colin Dawley, MD  fluticasone  (FLONASE ) 50 MCG/ACT nasal spray Place 2 sprays into both nostrils daily. Patient not taking: Reported on 02/11/2024 08/18/23   Mesner, Reymundo Caulk, MD  furosemide  (LASIX ) 20 MG tablet Take 1 tablet (20 mg total) by mouth every 12 (twelve) hours as needed. 04/10/24   Orvilla Blander, MD  hydrocortisone  1 % ointment Apply topically 2 (two) times daily. Apply to right leg Rash twice daily 02/14/24   Colin Dawley, MD  lactobacillus acidophilus & bulgar (LACTINEX) chewable tablet Chew 1 tablet by mouth 3 (three) times daily with meals. Ok to substitute Store Brand Probiotics 02/14/24   Colin Dawley, MD  nicotine  (NICODERM CQ  - DOSED IN MG/24 HOURS) 21 mg/24hr patch Place 1 patch (21 mg total) onto the skin daily. 02/15/24   Colin Dawley, MD  sertraline  (ZOLOFT ) 50 MG tablet Take 1 tablet (50 mg total) by mouth daily. 02/15/24   Colin Dawley, MD      Allergies    Codeine, Other, Penicillins, and Prednisone    Review of Systems   Review of Systems Please see HPI for pertinent positives and negatives  Physical Exam BP 131/71 (BP Location: Right Arm)   Pulse 69   Temp 97.6 F (36.4 C) (Oral)   Resp 18   Ht 5\' 9"  (1.753 m)   Wt 103 kg   SpO2 98%   BMI 33.53 kg/m   Physical Exam Vitals and nursing note reviewed.  HENT:     Head: Normocephalic.     Right Ear: There is impacted cerumen.     Left Ear: Tympanic membrane normal.     Ears:     Comments: Mild erythema of R TM after cerumen disimpaction but no other signs of infection    Nose: Nose normal.  Eyes:     Extraocular Movements: Extraocular movements intact.  Pulmonary:     Effort: Pulmonary effort is normal.  Musculoskeletal:        General: Normal range of motion.     Cervical back: Neck supple.  Skin:    Findings: No rash (on exposed skin).  Neurological:     Mental Status: He is alert and oriented to person, place, and time.  Psychiatric:        Mood and Affect: Mood normal.     ED Results / Procedures / Treatments   EKG None  Procedures Ear Cerumen  Removal  Date/Time: 04/17/2024 4:33 AM  Performed by: Charmayne Cooper, MD Authorized by: Charmayne Cooper, MD   Consent:    Consent obtained:  Verbal   Consent given by:  Patient Procedure details:    Location:  R ear   Procedure type: curette     Procedure outcomes: cerumen removed   Post-procedure details:    Inspection:  TM intact   Hearing quality:  Normal   Procedure completion:  Tolerated well, no immediate complications   Medications Ordered in the ED Medications  acetaminophen  (TYLENOL ) tablet 650 mg (has no administration in time range)    Initial Impression and Plan  Patient here for R ear pain after using a Q-tip to remove wax. He has some wax on exam removed as above. No signs of OE or OM. Recommend OTC pain medications for comfort and PCP follow up, RTED for any other  concerns.    ED Course       MDM Rules/Calculators/A&P Medical Decision Making Problems Addressed: Impacted cerumen of right ear: acute illness or injury Right ear pain: acute illness or injury  Risk OTC drugs.     Final Clinical Impression(s) / ED Diagnoses Final diagnoses:  Right ear pain  Impacted cerumen of right ear    Rx / DC Orders ED Discharge Orders     None        Charmayne Cooper, MD 04/17/24 [phone]

## 2024-04-17 NOTE — ED Notes (Signed)
 Pt already took 1,000 mg of Tylenol  before coming in the ED

## 2024-04-17 NOTE — ED Triage Notes (Signed)
 Pt with c/o R ear pain that he describes as "throbbing" and making a "humming" sound. Believes he might have stuck a Q-tip in too far.

## 2024-04-17 NOTE — ED Notes (Signed)
Went over d/c paperwork at this time with patient. Pt had no questions, comments or concerns after review and verbally understood them.

## 2024-04-30 ENCOUNTER — Encounter (HOSPITAL_COMMUNITY): Payer: Self-pay | Admitting: Emergency Medicine

## 2024-04-30 ENCOUNTER — Other Ambulatory Visit: Payer: Self-pay

## 2024-04-30 ENCOUNTER — Emergency Department (HOSPITAL_COMMUNITY)
Admission: EM | Admit: 2024-04-30 | Discharge: 2024-04-30 | Disposition: A | Discharge status: Home / Self Care | Attending: Emergency Medicine | Admitting: Emergency Medicine

## 2024-04-30 DIAGNOSIS — H66001 Acute suppurative otitis media without spontaneous rupture of ear drum, right ear: Secondary | ICD-10-CM | POA: Insufficient documentation

## 2024-04-30 DIAGNOSIS — Z79899 Other long term (current) drug therapy: Secondary | ICD-10-CM | POA: Insufficient documentation

## 2024-04-30 DIAGNOSIS — I10 Essential (primary) hypertension: Secondary | ICD-10-CM | POA: Diagnosis not present

## 2024-04-30 DIAGNOSIS — H9201 Otalgia, right ear: Secondary | ICD-10-CM | POA: Diagnosis present

## 2024-04-30 MED ORDER — AZITHROMYCIN 250 MG PO TABS
500.0000 mg | ORAL_TABLET | Freq: Once | ORAL | Status: AC
  Administered 2024-04-30: 500 mg via ORAL
  Filled 2024-04-30: qty 2

## 2024-04-30 MED ORDER — AZITHROMYCIN 250 MG PO TABS: 250.0000 mg | ORAL_TABLET | Freq: Every day | ORAL | 0 refills | Status: DC

## 2024-04-30 NOTE — ED Provider Notes (Signed)
  EMERGENCY DEPARTMENT AT [facility] Provider Note   CSN: [id number] Arrival date & time: 04/30/24  9528     History  Chief Complaint  Patient presents with   Otalgia    Gary Harrington is a 63 y.o. male.  Patient is a 63 year old male with history of hypertension.  Patient presenting with right ear pain.  He has been having issues with this for the past 2 weeks since using a Q-tip to clean out his ear.  He had some earwax removed in the ER 2 weeks ago, but symptoms persist.  He describes a "roaring sound" in the right ear.  No drainage or bleeding.  No fevers or chills.       Home Medications Prior to Admission medications   Medication Sig Start Date End Date Taking? Authorizing Provider  acetaminophen  (TYLENOL ) 325 MG tablet Take 2 tablets (650 mg total) by mouth every 6 (six) hours as needed for mild pain (pain score 1-3) (or Fever >/= 101). 02/14/24   Emokpae, Courage, MD  albuterol  (VENTOLIN  HFA) 108 (90 Base) MCG/ACT inhaler Inhale 2 puffs into the lungs every 4 (four) hours as needed for wheezing or shortness of breath. 02/14/24   Colin Dawley, MD  ALPRAZolam  (XANAX ) 1 MG tablet Take 1 tablet (1 mg total) by mouth daily as needed for anxiety. 02/14/24   Colin Dawley, MD  fluticasone  (FLONASE ) 50 MCG/ACT nasal spray Place 2 sprays into both nostrils daily. Patient not taking: Reported on 02/11/2024 08/18/23   Mesner, Reymundo Caulk, MD  furosemide  (LASIX ) 20 MG tablet Take 1 tablet (20 mg total) by mouth every 12 (twelve) hours as needed. 04/10/24   Orvilla Blander, MD  hydrocortisone  1 % ointment Apply topically 2 (two) times daily. Apply to right leg Rash twice daily 02/14/24   Colin Dawley, MD  lactobacillus acidophilus & bulgar (LACTINEX) chewable tablet Chew 1 tablet by mouth 3 (three) times daily with meals. Ok to substitute Store Brand Probiotics 02/14/24   Colin Dawley, MD  nicotine  (NICODERM CQ  - DOSED IN MG/24 HOURS) 21 mg/24hr patch Place 1 patch  (21 mg total) onto the skin daily. 02/15/24   Colin Dawley, MD  sertraline  (ZOLOFT ) 50 MG tablet Take 1 tablet (50 mg total) by mouth daily. 02/15/24   Colin Dawley, MD      Allergies    Codeine, Other, Penicillins, and Prednisone     Review of Systems   Review of Systems  All other systems reviewed and are negative.   Physical Exam Updated Vital Signs BP 121/79 (BP Location: Left Arm)   Pulse (!) 55   Temp 98.1 F (36.7 C) (Oral)   Resp 18   Ht 5\' 9"  (1.753 m)   Wt 103 kg   SpO2 97%   BMI 33.53 kg/m  Physical Exam Vitals and nursing note reviewed.  Constitutional:      Appearance: Normal appearance.  HENT:     Ears:     Comments: There is some wax noted in the right ear, but TM is easily visualized.  TM is erythematous and inflamed. Pulmonary:     Effort: Pulmonary effort is normal.  Skin:    General: Skin is warm and dry.  Neurological:     Mental Status: He is alert and oriented to person, place, and time.     ED Results / Procedures / Treatments   Labs (all labs ordered are listed, but only abnormal results are displayed) Labs Reviewed - No data to display  EKG None  Radiology No results found.  Procedures Procedures    Medications Ordered in ED Medications - No data to display  ED Course/ Medical Decision Making/ A&P  Otitis media.  Patient to be treated with antibiotics and follow-up as needed.  Final Clinical Impression(s) / ED Diagnoses Final diagnoses:  None    Rx / DC Orders ED Discharge Orders     None         Orvilla Blander, MD 04/30/24 [phone]

## 2024-04-30 NOTE — Discharge Instructions (Signed)
 Begin taking Zithromax  as prescribed.  Take ibuprofen  600 mg every 6 hours as needed for pain.  Follow-up with primary doctor if symptoms are not improving in the next few days.

## 2024-04-30 NOTE — ED Notes (Signed)
 Pt/family received d/c paperwork at this time. After going over the paperwork any questions, comments, or concerns were answered to the best of this nurse's knowledge. The pt/family verbally acknowledged the teachings/instructions.

## 2024-04-30 NOTE — ED Triage Notes (Signed)
 Pt here for c/o R ear pain and a "roaring" noise. States was seen here for same a couple of weeks ago but not improved.

## 2024-06-29 ENCOUNTER — Ambulatory Visit: Attending: Cardiovascular Disease | Admitting: Cardiology

## 2024-06-30 ENCOUNTER — Encounter: Payer: Self-pay | Admitting: Cardiology

## 2024-07-20 ENCOUNTER — Emergency Department (HOSPITAL_COMMUNITY)

## 2024-07-20 ENCOUNTER — Emergency Department (HOSPITAL_COMMUNITY)
Admission: EM | Admit: 2024-07-20 | Discharge: 2024-07-20 | Discharge status: Against Medical Advice | Attending: Emergency Medicine | Admitting: Emergency Medicine

## 2024-07-20 ENCOUNTER — Other Ambulatory Visit: Payer: Self-pay

## 2024-07-20 ENCOUNTER — Encounter (HOSPITAL_COMMUNITY): Payer: Self-pay

## 2024-07-20 DIAGNOSIS — M25562 Pain in left knee: Secondary | ICD-10-CM | POA: Diagnosis present

## 2024-07-20 DIAGNOSIS — Z5321 Procedure and treatment not carried out due to patient leaving prior to being seen by health care provider: Secondary | ICD-10-CM | POA: Insufficient documentation

## 2024-07-20 NOTE — ED Notes (Signed)
 Pt called two more times with no answer- pt not in lobby

## 2024-07-20 NOTE — ED Triage Notes (Signed)
 Pt to ED from home with c/o left knee pain that started 3 days ago, pt denies injury, says he tried to walk it off been walking all day.

## 2024-07-20 NOTE — ED Notes (Signed)
 Pt called to go back to room in FT- pt not in lobby at this time- will go to next pt waiting

## 2024-08-07 ENCOUNTER — Other Ambulatory Visit: Payer: Self-pay

## 2024-08-07 ENCOUNTER — Emergency Department (HOSPITAL_COMMUNITY)
Admission: EM | Admit: 2024-08-07 | Discharge: 2024-08-07 | Disposition: A | Discharge status: Home / Self Care | Attending: Emergency Medicine | Admitting: Emergency Medicine

## 2024-08-07 ENCOUNTER — Encounter (HOSPITAL_COMMUNITY): Payer: Self-pay

## 2024-08-07 ENCOUNTER — Emergency Department (HOSPITAL_COMMUNITY)

## 2024-08-07 DIAGNOSIS — J449 Chronic obstructive pulmonary disease, unspecified: Secondary | ICD-10-CM | POA: Diagnosis not present

## 2024-08-07 DIAGNOSIS — Z79899 Other long term (current) drug therapy: Secondary | ICD-10-CM | POA: Diagnosis not present

## 2024-08-07 DIAGNOSIS — M25562 Pain in left knee: Secondary | ICD-10-CM | POA: Diagnosis present

## 2024-08-07 DIAGNOSIS — I10 Essential (primary) hypertension: Secondary | ICD-10-CM | POA: Diagnosis not present

## 2024-08-07 MED ORDER — KETOROLAC TROMETHAMINE 60 MG/2ML IM SOLN
30.0000 mg | Freq: Once | INTRAMUSCULAR | Status: AC
  Administered 2024-08-07: 30 mg via INTRAMUSCULAR
  Filled 2024-08-07: qty 2

## 2024-08-07 MED ORDER — IBUPROFEN 800 MG PO TABS: 800.0000 mg | ORAL_TABLET | Freq: Three times a day (TID) | ORAL | 0 refills | Status: AC

## 2024-08-07 NOTE — ED Triage Notes (Signed)
 Patient from home for L knee pain that started about 3 weeks ago. Also reports that the pain traveled to his L hand for a short period of time. Denies any injuries to this knee. Also reports swelling. Came a few days ago but left due to long wait; unable to get to PCP due to transportation issues. Upon arrival to ER, patient is alert and oriented, ambu

## 2024-08-07 NOTE — ED Provider Notes (Signed)
 [place] EMERGENCY DEPARTMENT AT [facility] Provider Note   CSN: [id number] Arrival date & time: 08/07/24  0255     Patient presents with: Knee Pain   Gary Harrington is a 63 y.o. male.    Knee Pain Patient presents for left knee pain.  Medical history includes HTN, HLD, anxiety, COPD, chronic pain, depression.  He states that he had onset of atraumatic left knee pain several weeks ago.  He states that he has not been able to sleep in the past 3 weeks.  He has not taken anything today for his pain.     Prior to Admission medications   Medication Sig Start Date End Date Taking? Authorizing Provider  ibuprofen  (ADVIL ) 800 MG tablet Take 1 tablet (800 mg total) by mouth 3 (three) times daily. 08/07/24  Yes Melvenia Motto, MD  acetaminophen  (TYLENOL ) 325 MG tablet Take 2 tablets (650 mg total) by mouth every 6 (six) hours as needed for mild pain (pain score 1-3) (or Fever >/= 101). 02/14/24   Emokpae, Courage, MD  albuterol  (VENTOLIN  HFA) 108 (90 Base) MCG/ACT inhaler Inhale 2 puffs into the lungs every 4 (four) hours as needed for wheezing or shortness of breath. 02/14/24   Pearlean Manus, MD  ALPRAZolam  (XANAX ) 1 MG tablet Take 1 tablet (1 mg total) by mouth daily as needed for anxiety. 02/14/24   Pearlean Manus, MD  azithromycin  (ZITHROMAX ) 250 MG tablet Take 1 tablet (250 mg total) by mouth daily. 04/30/24   Geroldine Berg, MD  fluticasone  (FLONASE ) 50 MCG/ACT nasal spray Place 2 sprays into both nostrils daily. Patient not taking: Reported on 02/11/2024 08/18/23   Mesner, Selinda, MD  furosemide  (LASIX ) 20 MG tablet Take 1 tablet (20 mg total) by mouth every 12 (twelve) hours as needed. 04/10/24   Geroldine Berg, MD  hydrocortisone  1 % ointment Apply topically 2 (two) times daily. Apply to right leg Rash twice daily 02/14/24   Pearlean Manus, MD  lactobacillus acidophilus & bulgar (LACTINEX) chewable tablet Chew 1 tablet by mouth 3 (three) times daily with meals. Ok to substitute  Store Brand Probiotics 02/14/24   Pearlean Manus, MD  nicotine  (NICODERM CQ  - DOSED IN MG/24 HOURS) 21 mg/24hr patch Place 1 patch (21 mg total) onto the skin daily. 02/15/24   Pearlean Manus, MD  sertraline  (ZOLOFT ) 50 MG tablet Take 1 tablet (50 mg total) by mouth daily. 02/15/24   Pearlean Manus, MD    Allergies: Codeine, Other, Penicillins, and Prednisone     Review of Systems  Musculoskeletal:  Positive for arthralgias.  All other systems reviewed and are negative.   Updated Vital Signs BP 110/77   Pulse 69   Temp (!) 97.5 F (36.4 C) (Oral)   Resp 15   Ht 5' 9 (1.753 m)   Wt 90.7 kg   SpO2 92%   BMI 29.53 kg/m   Physical Exam Vitals and nursing note reviewed.  Constitutional:      General: He is not in acute distress.    Appearance: Normal appearance. He is well-developed. He is not ill-appearing, toxic-appearing or diaphoretic.  HENT:     Head: Normocephalic and atraumatic.     Right Ear: External ear normal.     Left Ear: External ear normal.     Nose: Nose normal.     Mouth/Throat:     Mouth: Mucous membranes are moist.  Eyes:     Extraocular Movements: Extraocular movements intact.     Conjunctiva/sclera: Conjunctivae normal.  Cardiovascular:  Rate and Rhythm: Normal rate and regular rhythm.  Pulmonary:     Effort: Pulmonary effort is normal. No respiratory distress.  Abdominal:     General: There is no distension.     Palpations: Abdomen is soft.  Musculoskeletal:        General: Tenderness present. No swelling or deformity.     Cervical back: Normal range of motion and neck supple.     Right lower leg: No edema.     Left lower leg: No edema.  Skin:    General: Skin is warm and dry.     Coloration: Skin is not jaundiced or pale.  Neurological:     General: No focal deficit present.     Mental Status: He is alert and oriented to person, place, and time.  Psychiatric:        Mood and Affect: Mood normal.        Behavior: Behavior normal.      (all labs ordered are listed, but only abnormal results are displayed) Labs Reviewed - No data to display  EKG: None  Radiology: DG Knee Left Port Result Date: 08/07/2024 CLINICAL DATA:  Left knee pain EXAM: PORTABLE LEFT KNEE - 1-2 VIEW COMPARISON:  07/20/2024 FINDINGS: Degenerative changes in the medial and patellofemoral compartments with joint space narrowing and spurring. No acute bony abnormality. Specifically, no fracture, subluxation, or dislocation. No joint effusion. IMPRESSION: Degenerative changes.  No acute bony abnormality. Electronically Signed   By: Franky Crease M.D.   On: 08/07/2024 03:54     Procedures   Medications Ordered in the ED  ketorolac  (TORADOL ) injection 30 mg (30 mg Intramuscular Given 08/07/24 0343)                                    Medical Decision Making Amount and/or Complexity of Data Reviewed Radiology: ordered.  Risk Prescription drug management.   Patient presenting for what he describes as left knee pain over the past several weeks, worse with ambulation, and causing him difficulty sleeping.  Per chart review, he has multiple prior ED visits describing pain.  He has a care plan in place stating that patient should not be given narcotic pain medications in the absence of objective findings.  On exam, he is overall well-appearing.  I do not appreciate any swelling, warmth, or erythema to his left knee.  Range of motion is intact.  He does endorse tenderness, greater on the medial aspect.  X-ray imaging was ordered.  Patient was given Ace wrap and Toradol .  X-ray shows arthritis.  Patient was advised to follow-up with orthopedic surgeon.  He was discharged in stable condition.     Final diagnoses:  Left knee pain, unspecified chronicity    ED Discharge Orders          Ordered    ibuprofen  (ADVIL ) 800 MG tablet  3 times daily        08/07/24 0322               Melvenia Motto, MD 08/07/24 [phone]

## 2024-08-07 NOTE — ED Notes (Signed)
 ED Provider at bedside.

## 2024-08-07 NOTE — Discharge Instructions (Addendum)
 Your x-ray shows arthritis.  Wear Ace wrap for comfort.  Take ibuprofen  and Tylenol  as needed for pain. Call the telephone number below to set up an appointment with the orthopedic doctor.

## 2024-08-10 ENCOUNTER — Other Ambulatory Visit: Payer: Self-pay

## 2024-08-10 ENCOUNTER — Emergency Department (HOSPITAL_COMMUNITY)
Admission: EM | Admit: 2024-08-10 | Discharge: 2024-08-10 | Discharge status: Against Medical Advice | Attending: Emergency Medicine | Admitting: Emergency Medicine

## 2024-08-10 ENCOUNTER — Encounter (HOSPITAL_COMMUNITY): Payer: Self-pay

## 2024-08-10 DIAGNOSIS — Z5321 Procedure and treatment not carried out due to patient leaving prior to being seen by health care provider: Secondary | ICD-10-CM | POA: Insufficient documentation

## 2024-08-10 DIAGNOSIS — M25562 Pain in left knee: Secondary | ICD-10-CM | POA: Diagnosis present

## 2024-08-10 DIAGNOSIS — M25462 Effusion, left knee: Secondary | ICD-10-CM | POA: Insufficient documentation

## 2024-08-10 NOTE — ED Provider Notes (Signed)
 Patient was not able to be found in the lobby.  Left without being seen.  I never evaluated the patient.   Freddi Hamilton, MD 08/10/24 [phone]

## 2024-08-10 NOTE — ED Triage Notes (Signed)
 Patient from home for L knee pain and swelling that started 3+ weeks ago. States he was here a few days ago and was referred to a local doctor but they are unable to see him; does have appt with his PCP this week. Upon arrival to ER, patient is alert and oriented, ambu

## 2024-12-10 ENCOUNTER — Other Ambulatory Visit: Payer: Self-pay

## 2024-12-10 ENCOUNTER — Emergency Department (HOSPITAL_COMMUNITY)
Admission: EM | Admit: 2024-12-10 | Discharge: 2024-12-10 | Disposition: A | Discharge status: Home / Self Care | Attending: Emergency Medicine | Admitting: Emergency Medicine

## 2024-12-10 ENCOUNTER — Emergency Department (HOSPITAL_COMMUNITY)

## 2024-12-10 ENCOUNTER — Encounter (HOSPITAL_COMMUNITY): Payer: Self-pay

## 2024-12-10 DIAGNOSIS — M25561 Pain in right knee: Secondary | ICD-10-CM | POA: Diagnosis present

## 2024-12-10 DIAGNOSIS — G8929 Other chronic pain: Secondary | ICD-10-CM | POA: Insufficient documentation

## 2024-12-10 DIAGNOSIS — M25562 Pain in left knee: Secondary | ICD-10-CM | POA: Diagnosis not present

## 2024-12-10 DIAGNOSIS — J449 Chronic obstructive pulmonary disease, unspecified: Secondary | ICD-10-CM | POA: Diagnosis not present

## 2024-12-10 DIAGNOSIS — I10 Essential (primary) hypertension: Secondary | ICD-10-CM | POA: Insufficient documentation

## 2024-12-10 MED ORDER — ACETAMINOPHEN 500 MG PO TABS
1000.0000 mg | ORAL_TABLET | Freq: Once | ORAL | Status: AC
  Administered 2024-12-10: 1000 mg via ORAL
  Filled 2024-12-10: qty 2

## 2024-12-10 MED ORDER — METHOCARBAMOL 500 MG PO TABS
1000.0000 mg | ORAL_TABLET | Freq: Once | ORAL | Status: AC
  Administered 2024-12-10: 1000 mg via ORAL
  Filled 2024-12-10: qty 2

## 2024-12-10 MED ORDER — METHOCARBAMOL 500 MG PO TABS: 500.0000 mg | ORAL_TABLET | Freq: Three times a day (TID) | ORAL | 0 refills | Status: AC | PRN

## 2024-12-10 MED ORDER — KETOROLAC TROMETHAMINE 60 MG/2ML IM SOLN
30.0000 mg | Freq: Once | INTRAMUSCULAR | Status: DC
  Filled 2024-12-10: qty 2
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
## 2024-12-10 NOTE — ED Notes (Signed)
 Pt was placed on 2L of [place] due to pt sleeping and SpO2 decreasing to 89% on  RA. Pt endorses he wears a Cpap at night while sleeping.

## 2024-12-10 NOTE — Discharge Instructions (Addendum)
 Take ibuprofen , Tylenol , and muscle relaxer as needed for your arthritis pain.  Follow-up with orthopedic surgery.  Telephone number is below.

## 2024-12-10 NOTE — ED Notes (Signed)
 Pt is Aox4 for this RN during assessment.

## 2024-12-20 ENCOUNTER — Other Ambulatory Visit: Payer: Self-pay

## 2024-12-20 ENCOUNTER — Encounter (HOSPITAL_COMMUNITY): Payer: Self-pay | Admitting: *Deleted

## 2024-12-20 ENCOUNTER — Emergency Department (HOSPITAL_COMMUNITY)
Admission: EM | Admit: 2024-12-20 | Discharge: 2024-12-20 | Disposition: A | Discharge status: Home / Self Care | Attending: Emergency Medicine | Admitting: Emergency Medicine

## 2024-12-20 ENCOUNTER — Emergency Department (HOSPITAL_COMMUNITY)

## 2024-12-20 DIAGNOSIS — R051 Acute cough: Secondary | ICD-10-CM | POA: Diagnosis not present

## 2024-12-20 DIAGNOSIS — R059 Cough, unspecified: Secondary | ICD-10-CM | POA: Diagnosis present

## 2024-12-20 MED ORDER — ALBUTEROL SULFATE HFA 108 (90 BASE) MCG/ACT IN AERS
2.0000 | INHALATION_SPRAY | Freq: Once | RESPIRATORY_TRACT | Status: AC
  Administered 2024-12-20: 2 via RESPIRATORY_TRACT
  Filled 2024-12-20: qty 6.7

## 2024-12-20 MED ORDER — DEXAMETHASONE 4 MG PO TABS
10.0000 mg | ORAL_TABLET | Freq: Once | ORAL | Status: DC
  Filled 2024-12-20: qty 3

## 2024-12-20 MED ORDER — AZITHROMYCIN 250 MG PO TABS: 250.0000 mg | ORAL_TABLET | Freq: Every day | ORAL | 0 refills | Status: AC

## 2024-12-20 MED ORDER — AZITHROMYCIN 250 MG PO TABS
500.0000 mg | ORAL_TABLET | Freq: Once | ORAL | Status: AC
  Administered 2024-12-20: 500 mg via ORAL
  Filled 2024-12-20: qty 2

## 2024-12-20 NOTE — ED Notes (Signed)
 ED Provider at bedside.
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
## 2024-12-20 NOTE — ED Triage Notes (Signed)
 Pt c/o cough x one week; pt states the cough is non-productive

## 2024-12-20 NOTE — ED Notes (Signed)
 Patient transported to X-ray

## 2024-12-20 NOTE — ED Provider Notes (Signed)
 " [place] EMERGENCY DEPARTMENT AT [facility] Provider Note   CSN: [id number] Arrival date & time: 12/20/24  9784     Patient presents with: Cough   Gary Harrington is a 63 y.o. male.   63 year old male who presents ER today with about a 7 to 10 days of cough and sinus congestion and pain and drainage.  States he has productive cough.  Does not smoke.  No known fevers.  No known sick contacts.  No lower extremity swelling.  No chest pain.  No other associated symptoms.   Cough      Prior to Admission medications  Medication Sig Start Date End Date Taking? Authorizing Provider  azithromycin  (ZITHROMAX ) 250 MG tablet Take 1 tablet (250 mg total) by mouth daily. Take 1 every day until finished. 12/20/24  Yes Deanta Mincey, Selinda, MD  acetaminophen  (TYLENOL ) 325 MG tablet Take 2 tablets (650 mg total) by mouth every 6 (six) hours as needed for mild pain (pain score 1-3) (or Fever >/= 101). 02/14/24   Emokpae, Courage, MD  albuterol  (VENTOLIN  HFA) 108 (90 Base) MCG/ACT inhaler Inhale 2 puffs into the lungs every 4 (four) hours as needed for wheezing or shortness of breath. 02/14/24   Pearlean Manus, MD  ALPRAZolam  (XANAX ) 1 MG tablet Take 1 tablet (1 mg total) by mouth daily as needed for anxiety. 02/14/24   Pearlean Manus, MD  fluticasone  (FLONASE ) 50 MCG/ACT nasal spray Place 2 sprays into both nostrils daily. Patient not taking: Reported on 02/11/2024 08/18/23   Simar Pothier, Selinda, MD  furosemide  (LASIX ) 20 MG tablet Take 1 tablet (20 mg total) by mouth every 12 (twelve) hours as needed. 04/10/24   Geroldine Berg, MD  hydrocortisone  1 % ointment Apply topically 2 (two) times daily. Apply to right leg Rash twice daily 02/14/24   Pearlean Manus, MD  ibuprofen  (ADVIL ) 800 MG tablet Take 1 tablet (800 mg total) by mouth 3 (three) times daily. 08/07/24   Melvenia Motto, MD  lactobacillus acidophilus & bulgar (LACTINEX) chewable tablet Chew 1 tablet by mouth 3 (three) times daily with meals. Ok  to substitute Store Brand Probiotics 02/14/24   Pearlean Manus, MD  methocarbamol  (ROBAXIN ) 500 MG tablet Take 1 tablet (500 mg total) by mouth every 8 (eight) hours as needed for muscle spasms. 12/10/24   Melvenia Motto, MD  nicotine  (NICODERM CQ  - DOSED IN MG/24 HOURS) 21 mg/24hr patch Place 1 patch (21 mg total) onto the skin daily. 02/15/24   Pearlean Manus, MD  sertraline  (ZOLOFT ) 50 MG tablet Take 1 tablet (50 mg total) by mouth daily. 02/15/24   Pearlean Manus, MD    Allergies: Codeine, Other, Penicillins, and Prednisone     Review of Systems  Respiratory:  Positive for cough.     Updated Vital Signs BP (!) 102/59 (BP Location: Left Arm)   Pulse 66   Temp 98 F (36.7 C) (Oral)   Resp 18   Ht 5' 9 (1.753 m)   Wt 95.3 kg   SpO2 95%   BMI 31.01 kg/m   Physical Exam Vitals and nursing note reviewed.  Constitutional:      Appearance: He is well-developed.  HENT:     Head: Normocephalic and atraumatic.  Eyes:     Pupils: Pupils are equal, round, and reactive to light.  Cardiovascular:     Rate and Rhythm: Normal rate.  Pulmonary:     Effort: Pulmonary effort is normal. No respiratory distress.  Abdominal:     General: There  is no distension.  Musculoskeletal:        General: Normal range of motion.     Cervical back: Normal range of motion.  Skin:    General: Skin is warm and dry.  Neurological:     Mental Status: He is alert.     (all labs ordered are listed, but only abnormal results are displayed) Labs Reviewed - No data to display  EKG: None  Radiology: DG Chest 2 View Result Date: 12/20/2024 EXAM: 2 VIEW(S) XRAY OF THE CHEST 12/20/2024 03:02:00 AM COMPARISON: 08/18/2023 CLINICAL HISTORY: eval for cough FINDINGS: LUNGS AND PLEURA: No focal pulmonary opacity. No pleural effusion. No pneumothorax. HEART AND MEDIASTINUM: No acute abnormality of the cardiac and mediastinal silhouettes. BONES AND SOFT TISSUES: Multilevel thoracic osteophytosis. Healed left rib  fractures. IMPRESSION: 1. No acute findings. 2. Multilevel thoracic osteophytosis. 3. Healed left rib fractures. Electronically signed by: Dorethia Molt MD 12/20/2024 04:06 AM EST RP Workstation: [id number]     Procedures   Medications Ordered in the ED  dexamethasone  (DECADRON ) tablet 10 mg (10 mg Oral Not Given 12/20/24 0350)  albuterol  (VENTOLIN  HFA) 108 (90 Base) MCG/ACT inhaler 2 puff (2 puffs Inhalation Given 12/20/24 0350)  azithromycin  (ZITHROMAX ) tablet 500 mg (500 mg Oral Given 12/20/24 0350)                                    Medical Decision Making Amount and/or Complexity of Data Reviewed Radiology: ordered.  Risk Prescription drug management.   Possibly sinusitis versus commune acquired pneumonia versus bronchitis.  Treat with antibiotics.  Given an inhaler which helped his cough and proved his symptoms overall.  Did not want to try Decadron  as he has a rash with prednisone .  Final diagnoses:  Acute cough    ED Discharge Orders          Ordered    azithromycin  (ZITHROMAX ) 250 MG tablet  Daily        12/20/24 0502               Nichole Keltner, Selinda, MD 12/20/24 [phone]  "

## 2024-12-24 ENCOUNTER — Emergency Department (HOSPITAL_COMMUNITY)
Admission: EM | Admit: 2024-12-24 | Discharge: 2024-12-24 | Disposition: A | Discharge status: Home / Self Care | Source: Home / Self Care | Attending: Emergency Medicine | Admitting: Emergency Medicine

## 2024-12-24 ENCOUNTER — Encounter (HOSPITAL_COMMUNITY): Payer: Self-pay | Admitting: Emergency Medicine

## 2024-12-24 ENCOUNTER — Other Ambulatory Visit: Payer: Self-pay

## 2024-12-24 DIAGNOSIS — J069 Acute upper respiratory infection, unspecified: Secondary | ICD-10-CM

## 2024-12-24 DIAGNOSIS — R059 Cough, unspecified: Secondary | ICD-10-CM | POA: Insufficient documentation

## 2024-12-24 DIAGNOSIS — R0981 Nasal congestion: Secondary | ICD-10-CM | POA: Insufficient documentation

## 2024-12-24 DIAGNOSIS — Z79899 Other long term (current) drug therapy: Secondary | ICD-10-CM | POA: Diagnosis not present

## 2024-12-24 MED ORDER — IPRATROPIUM-ALBUTEROL 0.5-2.5 (3) MG/3ML IN SOLN
3.0000 mL | Freq: Once | RESPIRATORY_TRACT | Status: AC
  Administered 2024-12-24: 3 mL via RESPIRATORY_TRACT
  Filled 2024-12-24: qty 3

## 2024-12-24 MED ORDER — DOXYCYCLINE HYCLATE 100 MG PO CAPS: 100.0000 mg | ORAL_CAPSULE | Freq: Two times a day (BID) | ORAL | 0 refills | Status: DC

## 2024-12-24 MED ORDER — DOXYCYCLINE HYCLATE 100 MG PO TABS
100.0000 mg | ORAL_TABLET | Freq: Once | ORAL | Status: AC
  Administered 2024-12-24: 100 mg via ORAL
  Filled 2024-12-24: qty 1

## 2024-12-24 MED ORDER — DOXYCYCLINE HYCLATE 100 MG PO CAPS: 100.0000 mg | ORAL_CAPSULE | Freq: Two times a day (BID) | ORAL | 0 refills | Status: AC

## 2024-12-24 NOTE — ED Triage Notes (Signed)
 Pt arrived c/o cough for the past few days. Pt seen here for same.

## 2024-12-24 NOTE — Discharge Instructions (Signed)
Begin taking doxycycline as prescribed.  Continue over-the-counter medications as needed for relief of symptoms.

## 2024-12-24 NOTE — ED Provider Notes (Signed)
 " [place] EMERGENCY DEPARTMENT AT [facility] Provider Note   CSN: [id number] Arrival date & time: 12/24/24  0230     Patient presents with: Cough   Gary Harrington is a 64 y.o. male.   Patient is a 64 year old male presenting with chest congestion, cough, sinus pressure, and feeling generally unwell.  He was seen here 3 days ago with similar complaints and prescribed Zithromax  and an inhaler, but this is not helping.  No fevers or chills.       Prior to Admission medications  Medication Sig Start Date End Date Taking? Authorizing Provider  acetaminophen  (TYLENOL ) 325 MG tablet Take 2 tablets (650 mg total) by mouth every 6 (six) hours as needed for mild pain (pain score 1-3) (or Fever >/= 101). 02/14/24   Emokpae, Courage, MD  albuterol  (VENTOLIN  HFA) 108 (90 Base) MCG/ACT inhaler Inhale 2 puffs into the lungs every 4 (four) hours as needed for wheezing or shortness of breath. 02/14/24   Pearlean Manus, MD  ALPRAZolam  (XANAX ) 1 MG tablet Take 1 tablet (1 mg total) by mouth daily as needed for anxiety. 02/14/24   Pearlean Manus, MD  azithromycin  (ZITHROMAX ) 250 MG tablet Take 1 tablet (250 mg total) by mouth daily. Take 1 every day until finished. 12/20/24   Mesner, Selinda, MD  fluticasone  (FLONASE ) 50 MCG/ACT nasal spray Place 2 sprays into both nostrils daily. Patient not taking: Reported on 02/11/2024 08/18/23   Mesner, Selinda, MD  furosemide  (LASIX ) 20 MG tablet Take 1 tablet (20 mg total) by mouth every 12 (twelve) hours as needed. 04/10/24   Geroldine Berg, MD  hydrocortisone  1 % ointment Apply topically 2 (two) times daily. Apply to right leg Rash twice daily 02/14/24   Pearlean Manus, MD  ibuprofen  (ADVIL ) 800 MG tablet Take 1 tablet (800 mg total) by mouth 3 (three) times daily. 08/07/24   Melvenia Motto, MD  lactobacillus acidophilus & bulgar (LACTINEX) chewable tablet Chew 1 tablet by mouth 3 (three) times daily with meals. Ok to substitute Store Brand Probiotics 02/14/24    Pearlean Manus, MD  methocarbamol  (ROBAXIN ) 500 MG tablet Take 1 tablet (500 mg total) by mouth every 8 (eight) hours as needed for muscle spasms. 12/10/24   Melvenia Motto, MD  nicotine  (NICODERM CQ  - DOSED IN MG/24 HOURS) 21 mg/24hr patch Place 1 patch (21 mg total) onto the skin daily. 02/15/24   Pearlean Manus, MD  sertraline  (ZOLOFT ) 50 MG tablet Take 1 tablet (50 mg total) by mouth daily. 02/15/24   Pearlean Manus, MD    Allergies: Codeine, Other, Penicillins, and Prednisone     Review of Systems  All other systems reviewed and are negative.   Updated Vital Signs BP 123/70 (BP Location: Left Arm)   Pulse 71   Temp 98.6 F (37 C) (Oral)   Resp 18   Ht 5' 9 (1.753 m)   Wt 95.3 kg   SpO2 95%   BMI 31.01 kg/m   Physical Exam Vitals and nursing note reviewed.  Constitutional:      General: He is not in acute distress.    Appearance: He is well-developed. He is not diaphoretic.  HENT:     Head: Normocephalic and atraumatic.  Cardiovascular:     Rate and Rhythm: Normal rate and regular rhythm.     Heart sounds: No murmur heard.    No friction rub.  Pulmonary:     Effort: Pulmonary effort is normal. No respiratory distress.     Breath sounds:  Normal breath sounds. No wheezing or rales.  Abdominal:     General: Bowel sounds are normal. There is no distension.     Palpations: Abdomen is soft.     Tenderness: There is no abdominal tenderness.  Musculoskeletal:        General: Normal range of motion.     Cervical back: Normal range of motion and neck supple.  Skin:    General: Skin is warm and dry.  Neurological:     Mental Status: He is alert and oriented to person, place, and time.     Coordination: Coordination normal.     (all labs ordered are listed, but only abnormal results are displayed) Labs Reviewed - No data to display  EKG: None  Radiology: No results found.   Procedures   Medications Ordered in the ED  ipratropium-albuterol  (DUONEB) 0.5-2.5  (3) MG/3ML nebulizer solution 3 mL (has no administration in time range)  doxycycline  (VIBRA -TABS) tablet 100 mg (has no administration in time range)                                    Medical Decision Making Risk Prescription drug management.   Patient returns with ongoing URI symptoms despite Zithromax .  He will be discharged with doxycycline  and continued use of his inhaler.     Final diagnoses:  None    ED Discharge Orders     None          Geroldine Berg, MD 12/24/24 0502  "

## 2024-12-31 ENCOUNTER — Emergency Department (HOSPITAL_COMMUNITY)
Admission: EM | Admit: 2024-12-31 | Discharge: 2024-12-31 | Disposition: A | Discharge status: Home / Self Care | Attending: Emergency Medicine | Admitting: Emergency Medicine

## 2024-12-31 DIAGNOSIS — J069 Acute upper respiratory infection, unspecified: Secondary | ICD-10-CM | POA: Diagnosis not present

## 2024-12-31 DIAGNOSIS — R059 Cough, unspecified: Secondary | ICD-10-CM | POA: Diagnosis present

## 2024-12-31 MED ORDER — BENZONATATE 100 MG PO CAPS
100.0000 mg | ORAL_CAPSULE | Freq: Once | ORAL | Status: AC
  Administered 2024-12-31: 100 mg via ORAL
  Filled 2024-12-31: qty 1

## 2024-12-31 MED ORDER — BENZONATATE 100 MG PO CAPS: 100.0000 mg | ORAL_CAPSULE | Freq: Three times a day (TID) | ORAL | 0 refills | Status: DC | PRN

## 2024-12-31 MED ORDER — BENZONATATE 100 MG PO CAPS: 100.0000 mg | ORAL_CAPSULE | Freq: Three times a day (TID) | ORAL | 0 refills | Status: AC | PRN

## 2024-12-31 NOTE — ED Triage Notes (Signed)
 Returns for cough and congestion. Been seen multiple times for the same. Took his antibiotics. Says he's not feeling any better but hasn't taken ant OTC meds for it.

## 2024-12-31 NOTE — ED Provider Notes (Signed)
 " [place] EMERGENCY DEPARTMENT AT [facility] Provider Note   CSN: [id number] Arrival date & time: 12/31/24  0532     Patient presents with: Nasal Congestion and Cough   Gary Harrington is a 64 y.o. male.   64 year old male presents ER today secondary to concern for persistent cough and congestion.  Patient started on antibiotics 2 days ago for the same.  Feels like he should be better by now he is not.  No other new symptoms.  No fevers.  No productive cough.  Of note I saw patient about a month ago with similar symptoms.  Patient is not sure if he thinks improved since that time.   Cough      Prior to Admission medications  Medication Sig Start Date End Date Taking? Authorizing Provider  acetaminophen  (TYLENOL ) 325 MG tablet Take 2 tablets (650 mg total) by mouth every 6 (six) hours as needed for mild pain (pain score 1-3) (or Fever >/= 101). 02/14/24   Emokpae, Courage, MD  albuterol  (VENTOLIN  HFA) 108 (90 Base) MCG/ACT inhaler Inhale 2 puffs into the lungs every 4 (four) hours as needed for wheezing or shortness of breath. 02/14/24   Pearlean Manus, MD  ALPRAZolam  (XANAX ) 1 MG tablet Take 1 tablet (1 mg total) by mouth daily as needed for anxiety. 02/14/24   Pearlean Manus, MD  azithromycin  (ZITHROMAX ) 250 MG tablet Take 1 tablet (250 mg total) by mouth daily. Take 1 every day until finished. 12/20/24   Zakyria Metzinger, Selinda, MD  benzonatate  (TESSALON ) 100 MG capsule Take 1 capsule (100 mg total) by mouth 3 (three) times daily as needed for cough. 12/31/24   Chapman Matteucci, Selinda, MD  doxycycline  (VIBRAMYCIN ) 100 MG capsule Take 1 capsule (100 mg total) by mouth 2 (two) times daily. One po bid x 7 days 12/24/24   Geroldine Berg, MD  fluticasone  (FLONASE ) 50 MCG/ACT nasal spray Place 2 sprays into both nostrils daily. Patient not taking: Reported on 02/11/2024 08/18/23   Alexsia Klindt, Selinda, MD  furosemide  (LASIX ) 20 MG tablet Take 1 tablet (20 mg total) by mouth every 12 (twelve) hours as  needed. 04/10/24   Geroldine Berg, MD  hydrocortisone  1 % ointment Apply topically 2 (two) times daily. Apply to right leg Rash twice daily 02/14/24   Pearlean Manus, MD  ibuprofen  (ADVIL ) 800 MG tablet Take 1 tablet (800 mg total) by mouth 3 (three) times daily. 08/07/24   Melvenia Motto, MD  lactobacillus acidophilus & bulgar (LACTINEX) chewable tablet Chew 1 tablet by mouth 3 (three) times daily with meals. Ok to substitute Store Brand Probiotics 02/14/24   Pearlean Manus, MD  methocarbamol  (ROBAXIN ) 500 MG tablet Take 1 tablet (500 mg total) by mouth every 8 (eight) hours as needed for muscle spasms. 12/10/24   Melvenia Motto, MD  nicotine  (NICODERM CQ  - DOSED IN MG/24 HOURS) 21 mg/24hr patch Place 1 patch (21 mg total) onto the skin daily. 02/15/24   Pearlean Manus, MD  sertraline  (ZOLOFT ) 50 MG tablet Take 1 tablet (50 mg total) by mouth daily. 02/15/24   Pearlean Manus, MD    Allergies: Codeine, Other, Penicillins, and Prednisone     Review of Systems  Respiratory:  Positive for cough.     Updated Vital Signs BP 123/75   Pulse 63   Temp 97.9 F (36.6 C) (Oral)   Resp 18   SpO2 94%   Physical Exam Vitals and nursing note reviewed.  Constitutional:      Appearance: He is well-developed.  HENT:     Head: Normocephalic and atraumatic.  Cardiovascular:     Rate and Rhythm: Normal rate.  Pulmonary:     Effort: Pulmonary effort is normal. No respiratory distress.     Breath sounds: No wheezing or rales.  Abdominal:     General: There is no distension.  Musculoskeletal:        General: Normal range of motion.     Cervical back: Normal range of motion.  Neurological:     Mental Status: He is alert.     (all labs ordered are listed, but only abnormal results are displayed) Labs Reviewed - No data to display  EKG: None  Radiology: No results found.   Procedures   Medications Ordered in the ED  benzonatate  (TESSALON ) capsule 100 mg (100 mg Oral Given 12/31/24 0640)                                     Medical Decision Making Risk Prescription drug management.   Overall patient appears well and at baseline.  His lungs are clear.  His vital signs are within normal limits for him.  He is already on appropriate treatment will try some Tessalon  to see if that helps with his cough but I discussed with him that he needs to wait a while for the antibiotics to work before deciding that they do not.  Can Follow-up with PCP for any further management.   Final diagnoses:  Upper respiratory tract infection, unspecified type    ED Discharge Orders          Ordered    benzonatate  (TESSALON ) 100 MG capsule  3 times daily PRN,   Status:  Discontinued        12/31/24 0632    benzonatate  (TESSALON ) 100 MG capsule  3 times daily PRN        12/31/24 0633               Emely Fahy, Selinda, MD 12/31/24 9281  "

## 2025-01-24 ENCOUNTER — Emergency Department (HOSPITAL_COMMUNITY)
Admission: EM | Admit: 2025-01-24 | Discharge: 2025-01-24 | Disposition: A | Discharge status: Home / Self Care | Attending: Emergency Medicine | Admitting: Emergency Medicine

## 2025-01-24 ENCOUNTER — Encounter (HOSPITAL_COMMUNITY): Payer: Self-pay

## 2025-01-24 ENCOUNTER — Other Ambulatory Visit: Payer: Self-pay

## 2025-01-24 DIAGNOSIS — K0889 Other specified disorders of teeth and supporting structures: Secondary | ICD-10-CM | POA: Insufficient documentation

## 2025-01-24 MED ORDER — NAPROXEN 250 MG PO TABS
500.0000 mg | ORAL_TABLET | Freq: Once | ORAL | Status: AC
  Administered 2025-01-24: 500 mg via ORAL
  Filled 2025-01-24: qty 2

## 2025-01-24 MED ORDER — CLINDAMYCIN HCL 150 MG PO CAPS
300.0000 mg | ORAL_CAPSULE | Freq: Once | ORAL | Status: AC
  Administered 2025-01-24: 300 mg via ORAL
  Filled 2025-01-24: qty 2

## 2025-01-24 MED ORDER — CLINDAMYCIN HCL 300 MG PO CAPS: 300.0000 mg | ORAL_CAPSULE | Freq: Four times a day (QID) | ORAL | 0 refills | Status: AC

## 2025-01-24 MED ORDER — NAPROXEN 500 MG PO TABS: 500.0000 mg | ORAL_TABLET | Freq: Two times a day (BID) | ORAL | 0 refills | Status: AC

## 2025-01-24 MED ORDER — NAPROXEN 500 MG PO TABS: 500.0000 mg | ORAL_TABLET | Freq: Two times a day (BID) | ORAL | 0 refills | Status: DC

## 2025-01-24 MED ORDER — CLINDAMYCIN HCL 300 MG PO CAPS: 300.0000 mg | ORAL_CAPSULE | Freq: Four times a day (QID) | ORAL | 0 refills | Status: DC

## 2025-01-24 NOTE — ED Provider Notes (Signed)
 " [place] EMERGENCY DEPARTMENT AT [facility] Provider Note   CSN: [id number] Arrival date & time: 01/24/25  9845     Patient presents with: No chief complaint on file.   Gary Harrington is a 64 y.o. male.   65 year old male very well-known to myself in this department the presents ER today with dental pain.  Patient states he knows he needs to get multiple teeth pulled but yesterday his upper front left incisor started hurting (or what is left of it) with some swelling.  Patient states he took some home pain medication which did not seem to help.  He is wondering if he needs antibiotics.  No trouble swallowing or breathing.  No fevers.  No external swelling.        Prior to Admission medications  Medication Sig Start Date End Date Taking? Authorizing Provider  clindamycin  (CLEOCIN ) 300 MG capsule Take 1 capsule (300 mg total) by mouth 4 (four) times daily. X 7 days 01/24/25  Yes Tyrail Grandfield, Selinda, MD  naproxen  (NAPROSYN ) 500 MG tablet Take 1 tablet (500 mg total) by mouth 2 (two) times daily. 01/24/25  Yes Aliece Honold, Selinda, MD  acetaminophen  (TYLENOL ) 325 MG tablet Take 2 tablets (650 mg total) by mouth every 6 (six) hours as needed for mild pain (pain score 1-3) (or Fever >/= 101). 02/14/24   Emokpae, Courage, MD  albuterol  (VENTOLIN  HFA) 108 (90 Base) MCG/ACT inhaler Inhale 2 puffs into the lungs every 4 (four) hours as needed for wheezing or shortness of breath. 02/14/24   Pearlean Manus, MD  ALPRAZolam  (XANAX ) 1 MG tablet Take 1 tablet (1 mg total) by mouth daily as needed for anxiety. 02/14/24   Pearlean Manus, MD  azithromycin  (ZITHROMAX ) 250 MG tablet Take 1 tablet (250 mg total) by mouth daily. Take 1 every day until finished. 12/20/24   Hidaya Daniel, Selinda, MD  benzonatate  (TESSALON ) 100 MG capsule Take 1 capsule (100 mg total) by mouth 3 (three) times daily as needed for cough. 12/31/24   Lennart Gladish, Selinda, MD  doxycycline  (VIBRAMYCIN ) 100 MG capsule Take 1 capsule (100 mg total) by  mouth 2 (two) times daily. One po bid x 7 days 12/24/24   Geroldine Berg, MD  fluticasone  (FLONASE ) 50 MCG/ACT nasal spray Place 2 sprays into both nostrils daily. Patient not taking: Reported on 02/11/2024 08/18/23   Fredick Schlosser, Selinda, MD  furosemide  (LASIX ) 20 MG tablet Take 1 tablet (20 mg total) by mouth every 12 (twelve) hours as needed. 04/10/24   Geroldine Berg, MD  hydrocortisone  1 % ointment Apply topically 2 (two) times daily. Apply to right leg Rash twice daily 02/14/24   Pearlean Manus, MD  ibuprofen  (ADVIL ) 800 MG tablet Take 1 tablet (800 mg total) by mouth 3 (three) times daily. 08/07/24   Melvenia Motto, MD  lactobacillus acidophilus & bulgar (LACTINEX) chewable tablet Chew 1 tablet by mouth 3 (three) times daily with meals. Ok to substitute Store Brand Probiotics 02/14/24   Pearlean Manus, MD  methocarbamol  (ROBAXIN ) 500 MG tablet Take 1 tablet (500 mg total) by mouth every 8 (eight) hours as needed for muscle spasms. 12/10/24   Melvenia Motto, MD  nicotine  (NICODERM CQ  - DOSED IN MG/24 HOURS) 21 mg/24hr patch Place 1 patch (21 mg total) onto the skin daily. 02/15/24   Pearlean Manus, MD  sertraline  (ZOLOFT ) 50 MG tablet Take 1 tablet (50 mg total) by mouth daily. 02/15/24   Pearlean Manus, MD    Allergies: Codeine, Other, Penicillins, and Prednisone   Review of Systems  Updated Vital Signs BP 123/78   Pulse 75   Temp 98 F (36.7 C)   Resp 18   SpO2 97%   Physical Exam Vitals and nursing note reviewed.  Constitutional:      Appearance: He is well-developed.  HENT:     Head: Normocephalic and atraumatic.     Comments: The area of discomfort does show some abnormal gums and what is left of an incisor.  No drainable abscess.  Not really that erythematous.  No deep neck space infection. Eyes:     Pupils: Pupils are equal, round, and reactive to light.  Cardiovascular:     Rate and Rhythm: Normal rate.  Pulmonary:     Effort: Pulmonary effort is normal. No respiratory distress.   Abdominal:     General: There is no distension.  Musculoskeletal:        General: Normal range of motion.     Cervical back: Normal range of motion.  Neurological:     Mental Status: He is alert.     (all labs ordered are listed, but only abnormal results are displayed) Labs Reviewed - No data to display  EKG: None  Radiology: No results found.   Procedures   Medications Ordered in the ED  clindamycin  (CLEOCIN ) capsule 300 mg (has no administration in time range)  naproxen  (NAPROSYN ) tablet 500 mg (has no administration in time range)                                    Medical Decision Making Risk Prescription drug management.   64 year old male presents ER today with dental pain.  Appears to be uncomplicated.  Already knows he needs to see a dentist.  Has an allergy to penicillin  so we will start clindamycin .  Patient states he has other pain medications at home so we will use anti-inflammatories here.  Final diagnoses:  Pain, dental    ED Discharge Orders          Ordered    clindamycin  (CLEOCIN ) 300 MG capsule  4 times daily        01/24/25 0306    naproxen  (NAPROSYN ) 500 MG tablet  2 times daily        01/24/25 0306               Diannia Hogenson, Selinda, MD 01/24/25 0309  "

## 2025-01-24 NOTE — ED Triage Notes (Signed)
 Pt c/o dental pain that started yesterday. Pain 10/10.
